# Patient Record
Sex: Female | Born: 1965
Health system: Southern US, Community
[De-identification: ages and names within clinical notes are randomized; demographics above are authoritative.]

## PROBLEM LIST (undated history)

## (undated) DIAGNOSIS — I10 Essential (primary) hypertension: Secondary | ICD-10-CM

## (undated) DIAGNOSIS — E785 Hyperlipidemia, unspecified: Secondary | ICD-10-CM

## (undated) DIAGNOSIS — R739 Hyperglycemia, unspecified: Secondary | ICD-10-CM

## (undated) DIAGNOSIS — E119 Type 2 diabetes mellitus without complications: Secondary | ICD-10-CM

## (undated) HISTORY — DX: Hyperlipidemia, unspecified: E78.5

## (undated) HISTORY — DX: Essential (primary) hypertension: I10

## (undated) HISTORY — DX: Hyperglycemia, unspecified: R73.9

---

## 2001-04-01 ENCOUNTER — Encounter: Payer: Self-pay | Admitting: Emergency Medicine

## 2001-04-01 ENCOUNTER — Emergency Department (HOSPITAL_COMMUNITY): Admission: EM | Admit: 2001-04-01 | Discharge: 2001-04-01 | Payer: Self-pay | Admitting: Emergency Medicine

## 2002-10-22 ENCOUNTER — Other Ambulatory Visit: Admission: RE | Admit: 2002-10-22 | Discharge: 2002-10-22 | Payer: Self-pay | Admitting: Family Medicine

## 2002-10-24 ENCOUNTER — Encounter: Admission: RE | Admit: 2002-10-24 | Discharge: 2002-10-24 | Payer: Self-pay | Admitting: Family Medicine

## 2002-10-24 ENCOUNTER — Encounter: Payer: Self-pay | Admitting: Family Medicine

## 2004-06-09 ENCOUNTER — Ambulatory Visit: Payer: Self-pay | Admitting: Family Medicine

## 2005-05-10 ENCOUNTER — Ambulatory Visit: Payer: Self-pay | Admitting: Family Medicine

## 2005-05-12 ENCOUNTER — Ambulatory Visit: Payer: Self-pay | Admitting: Internal Medicine

## 2005-05-15 ENCOUNTER — Encounter: Admission: RE | Admit: 2005-05-15 | Discharge: 2005-05-15 | Payer: Self-pay | Admitting: Family Medicine

## 2005-05-25 ENCOUNTER — Ambulatory Visit: Payer: Self-pay | Admitting: Family Medicine

## 2005-05-30 ENCOUNTER — Ambulatory Visit: Payer: Self-pay | Admitting: Family Medicine

## 2005-06-02 ENCOUNTER — Ambulatory Visit: Payer: Self-pay | Admitting: Family Medicine

## 2005-07-06 ENCOUNTER — Ambulatory Visit: Payer: Self-pay | Admitting: Family Medicine

## 2005-09-01 ENCOUNTER — Ambulatory Visit: Payer: Self-pay | Admitting: Family Medicine

## 2006-10-05 ENCOUNTER — Encounter: Admission: RE | Admit: 2006-10-05 | Discharge: 2006-10-05 | Payer: Self-pay | Admitting: Family Medicine

## 2007-01-22 ENCOUNTER — Telehealth: Payer: Self-pay | Admitting: Family Medicine

## 2007-01-24 ENCOUNTER — Ambulatory Visit: Payer: Self-pay | Admitting: Family Medicine

## 2007-01-24 DIAGNOSIS — Z9889 Other specified postprocedural states: Secondary | ICD-10-CM

## 2007-01-24 DIAGNOSIS — I10 Essential (primary) hypertension: Secondary | ICD-10-CM

## 2007-01-24 DIAGNOSIS — D573 Sickle-cell trait: Secondary | ICD-10-CM

## 2007-02-07 ENCOUNTER — Ambulatory Visit: Payer: Self-pay | Admitting: Family Medicine

## 2007-02-14 LAB — CONVERTED CEMR LAB
ALT: 18 units/L (ref 0–35)
AST: 22 units/L (ref 0–37)
Albumin: 3.7 g/dL (ref 3.5–5.2)
Alkaline Phosphatase: 84 units/L (ref 39–117)
BUN: 8 mg/dL (ref 6–23)
Basophils Absolute: 0 10*3/uL (ref 0.0–0.1)
Basophils Relative: 0.7 % (ref 0.0–1.0)
Bilirubin, Direct: 0.1 mg/dL (ref 0.0–0.3)
CO2: 29 meq/L (ref 19–32)
Calcium: 9.6 mg/dL (ref 8.4–10.5)
Chloride: 99 meq/L (ref 96–112)
Cholesterol: 170 mg/dL (ref 0–200)
Creatinine, Ser: 0.9 mg/dL (ref 0.4–1.2)
Eosinophils Absolute: 0.1 10*3/uL (ref 0.0–0.6)
Eosinophils Relative: 1.4 % (ref 0.0–5.0)
GFR calc Af Amer: 88 mL/min
GFR calc non Af Amer: 73 mL/min
Glucose, Bld: 110 mg/dL — ABNORMAL HIGH (ref 70–99)
HCT: 38.3 % (ref 36.0–46.0)
HDL: 37.4 mg/dL — ABNORMAL LOW (ref >39.0)
Hemoglobin: 12.5 g/dL (ref 12.0–15.0)
Hgb A1c MFr Bld: 6.6 % — ABNORMAL HIGH (ref 4.6–6.0)
LDL Cholesterol: 118 mg/dL — ABNORMAL HIGH (ref 0–99)
Lymphocytes Relative: 40.4 % (ref 12.0–46.0)
MCHC: 32.6 g/dL (ref 30.0–36.0)
MCV: 76.2 fL — ABNORMAL LOW (ref 78.0–100.0)
Monocytes Absolute: 0.4 10*3/uL (ref 0.2–0.7)
Monocytes Relative: 6.5 % (ref 3.0–11.0)
Neutro Abs: 3.1 10*3/uL (ref 1.4–7.7)
Neutrophils Relative %: 51 % (ref 43.0–77.0)
Platelets: 295 10*3/uL (ref 150–400)
Potassium: 3.5 meq/L (ref 3.5–5.1)
RBC: 5.03 M/uL (ref 3.87–5.11)
RDW: 13.3 % (ref 11.5–14.6)
Sodium: 139 meq/L (ref 135–145)
TSH: 1.45 microintl units/mL (ref 0.35–5.50)
Total Bilirubin: 0.7 mg/dL (ref 0.3–1.2)
Total CHOL/HDL Ratio: 4.5
Total Protein: 8 g/dL (ref 6.0–8.3)
Triglycerides: 75 mg/dL (ref 0–149)
VLDL: 15 mg/dL (ref 0–40)
WBC: 6 10*3/uL (ref 4.5–10.5)

## 2007-02-22 ENCOUNTER — Encounter: Payer: Self-pay | Admitting: Family Medicine

## 2007-02-22 ENCOUNTER — Other Ambulatory Visit: Admission: RE | Admit: 2007-02-22 | Discharge: 2007-02-22 | Payer: Self-pay | Admitting: Family Medicine

## 2007-02-22 ENCOUNTER — Ambulatory Visit: Payer: Self-pay | Admitting: Family Medicine

## 2007-02-28 ENCOUNTER — Encounter (INDEPENDENT_AMBULATORY_CARE_PROVIDER_SITE_OTHER): Payer: Self-pay | Admitting: *Deleted

## 2007-10-14 ENCOUNTER — Ambulatory Visit: Payer: Self-pay | Admitting: Family Medicine

## 2007-10-14 DIAGNOSIS — R7309 Other abnormal glucose: Secondary | ICD-10-CM | POA: Insufficient documentation

## 2007-10-14 DIAGNOSIS — E785 Hyperlipidemia, unspecified: Secondary | ICD-10-CM

## 2007-10-25 ENCOUNTER — Ambulatory Visit: Payer: Self-pay | Admitting: Family Medicine

## 2007-11-01 ENCOUNTER — Ambulatory Visit: Payer: Self-pay | Admitting: Family Medicine

## 2007-11-01 DIAGNOSIS — M25529 Pain in unspecified elbow: Secondary | ICD-10-CM | POA: Insufficient documentation

## 2007-11-01 LAB — CONVERTED CEMR LAB
ALT: 14 units/L (ref 0–35)
AST: 19 units/L (ref 0–37)
Albumin: 3.7 g/dL (ref 3.5–5.2)
Alkaline Phosphatase: 75 units/L (ref 39–117)
BUN: 9 mg/dL (ref 6–23)
Bilirubin, Direct: 0.1 mg/dL (ref 0.0–0.3)
CO2: 31 meq/L (ref 19–32)
Calcium: 9.6 mg/dL (ref 8.4–10.5)
Chloride: 101 meq/L (ref 96–112)
Cholesterol: 178 mg/dL (ref 0–200)
Creatinine, Ser: 1 mg/dL (ref 0.4–1.2)
GFR calc Af Amer: 77 mL/min
GFR calc non Af Amer: 64 mL/min
Glucose, Bld: 131 mg/dL — ABNORMAL HIGH (ref 70–99)
HDL: 35.3 mg/dL — ABNORMAL LOW (ref >39.0)
Hgb A1c MFr Bld: 6.1 % — ABNORMAL HIGH (ref 4.6–6.0)
LDL Cholesterol: 128 mg/dL — ABNORMAL HIGH (ref 0–99)
Potassium: 3.6 meq/L (ref 3.5–5.1)
Sodium: 138 meq/L (ref 135–145)
Total Bilirubin: 0.6 mg/dL (ref 0.3–1.2)
Total CHOL/HDL Ratio: 5
Total Protein: 7.9 g/dL (ref 6.0–8.3)
Triglycerides: 76 mg/dL (ref 0–149)
VLDL: 15 mg/dL (ref 0–40)

## 2007-11-04 ENCOUNTER — Ambulatory Visit: Payer: Self-pay | Admitting: Family Medicine

## 2007-11-05 ENCOUNTER — Telehealth (INDEPENDENT_AMBULATORY_CARE_PROVIDER_SITE_OTHER): Payer: Self-pay | Admitting: *Deleted

## 2007-11-05 ENCOUNTER — Encounter: Payer: Self-pay | Admitting: Family Medicine

## 2008-02-10 ENCOUNTER — Telehealth (INDEPENDENT_AMBULATORY_CARE_PROVIDER_SITE_OTHER): Payer: Self-pay | Admitting: *Deleted

## 2008-02-13 ENCOUNTER — Telehealth (INDEPENDENT_AMBULATORY_CARE_PROVIDER_SITE_OTHER): Payer: Self-pay | Admitting: *Deleted

## 2008-02-14 ENCOUNTER — Encounter: Payer: Self-pay | Admitting: Family Medicine

## 2008-02-18 ENCOUNTER — Telehealth: Payer: Self-pay | Admitting: Family Medicine

## 2008-02-28 ENCOUNTER — Ambulatory Visit: Payer: Self-pay | Admitting: Family Medicine

## 2008-02-28 DIAGNOSIS — Z8679 Personal history of other diseases of the circulatory system: Secondary | ICD-10-CM | POA: Insufficient documentation

## 2008-03-08 ENCOUNTER — Encounter (INDEPENDENT_AMBULATORY_CARE_PROVIDER_SITE_OTHER): Payer: Self-pay | Admitting: *Deleted

## 2008-03-08 LAB — CONVERTED CEMR LAB
ALT: 15 units/L (ref 0–35)
AST: 18 units/L (ref 0–37)
Albumin: 4.2 g/dL (ref 3.5–5.2)
Alkaline Phosphatase: 66 units/L (ref 39–117)
BUN: 9 mg/dL (ref 6–23)
Bilirubin, Direct: 0.1 mg/dL (ref 0.0–0.3)
CO2: 25 meq/L (ref 19–32)
Calcium: 9.7 mg/dL (ref 8.4–10.5)
Chloride: 101 meq/L (ref 96–112)
Cholesterol: 175 mg/dL (ref 0–200)
Creatinine, Ser: 0.9 mg/dL (ref 0.4–1.2)
GFR calc Af Amer: 87 mL/min
GFR calc non Af Amer: 72 mL/min
Glucose, Bld: 93 mg/dL (ref 70–99)
HDL: 31 mg/dL — ABNORMAL LOW (ref >39.0)
LDL Cholesterol: 128 mg/dL — ABNORMAL HIGH (ref 0–99)
Potassium: 3.3 meq/L — ABNORMAL LOW (ref 3.5–5.1)
Sodium: 138 meq/L (ref 135–145)
Total Bilirubin: 0.8 mg/dL (ref 0.3–1.2)
Total CHOL/HDL Ratio: 5.6
Total Protein: 8.6 g/dL — ABNORMAL HIGH (ref 6.0–8.3)
Triglycerides: 79 mg/dL (ref 0–149)
VLDL: 16 mg/dL (ref 0–40)

## 2008-04-07 ENCOUNTER — Telehealth (INDEPENDENT_AMBULATORY_CARE_PROVIDER_SITE_OTHER): Payer: Self-pay | Admitting: *Deleted

## 2009-01-02 IMAGING — CR DG ELBOW COMPLETE 3+V*R*
4 series · 4 of 4 positions shown · non-contrast
Comparison: None.

CLINICAL DATA: Pain, lateral side.  Injury [DATE]

RIGHT ELBOW - COMPLETE 3+ VIEW

[view not recorded (1 of 4)]
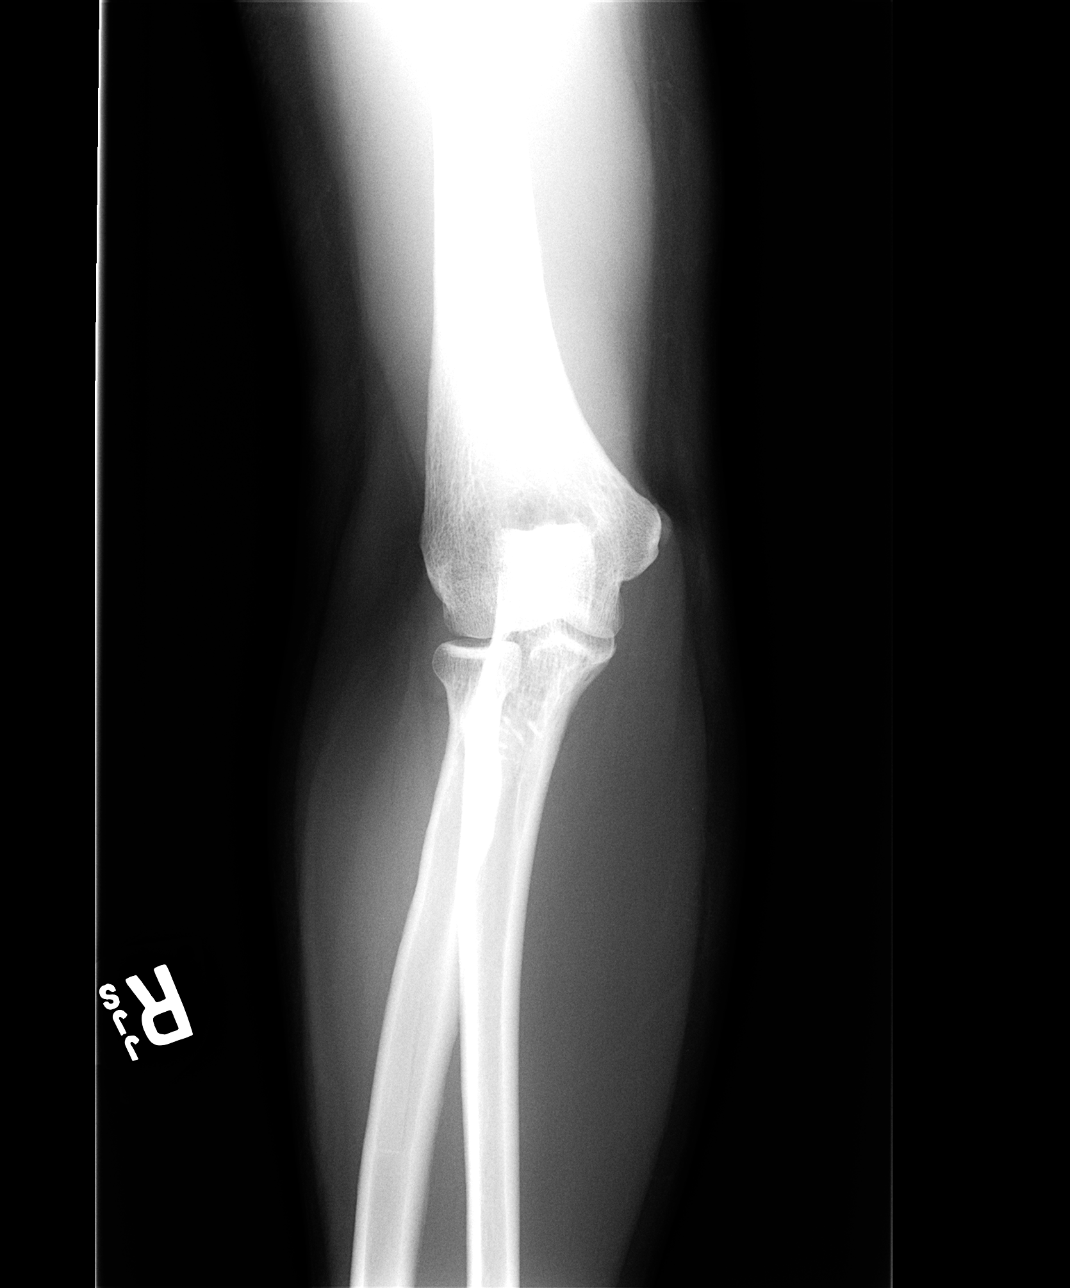

[view not recorded (2 of 4)]
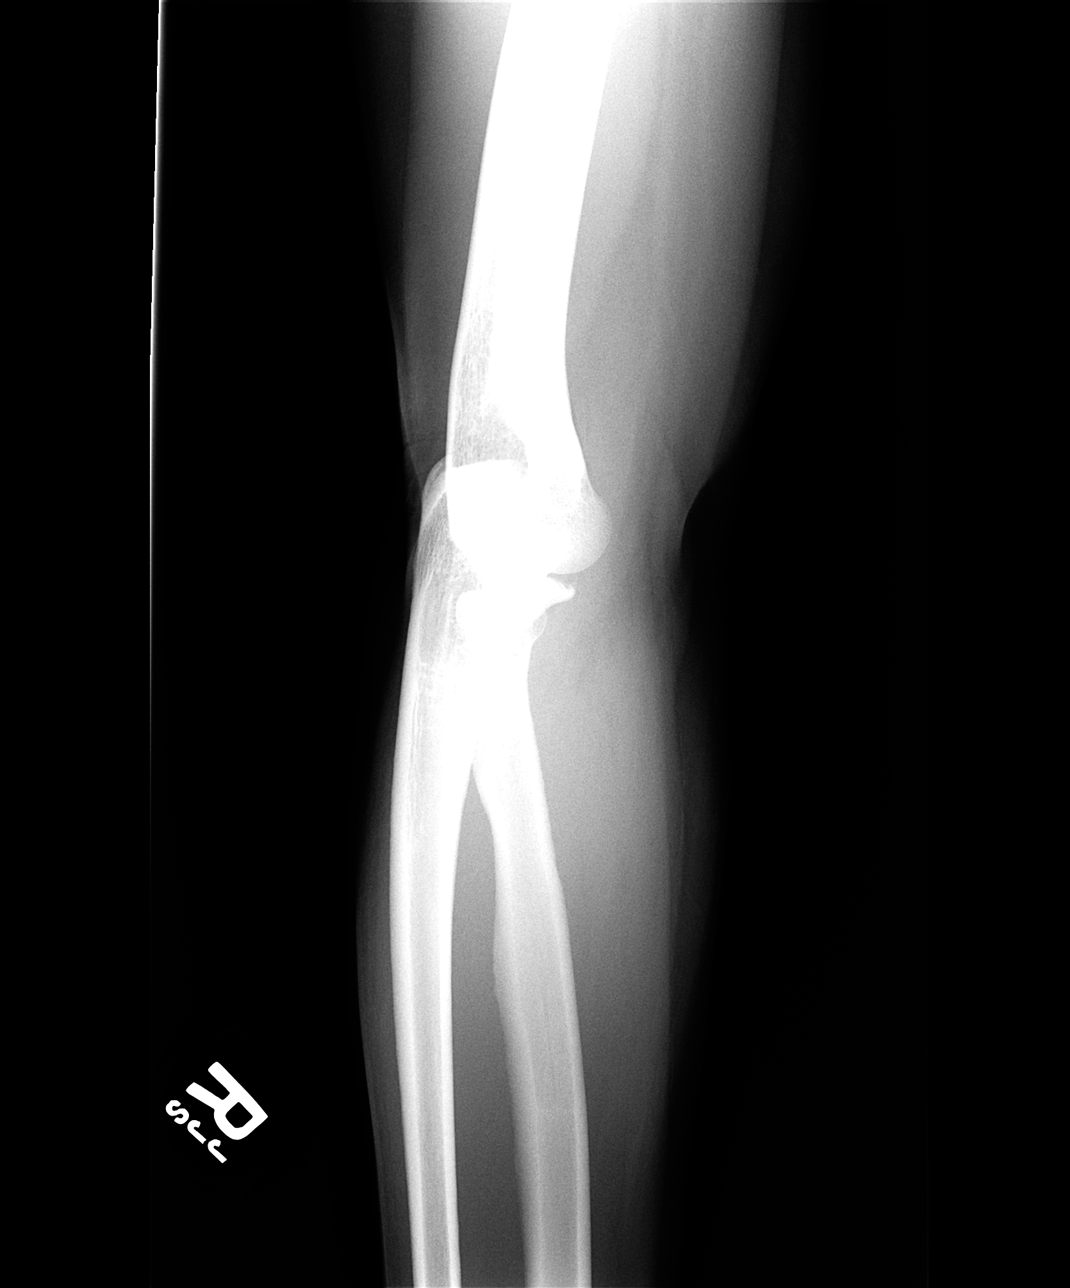

[view not recorded (3 of 4)]
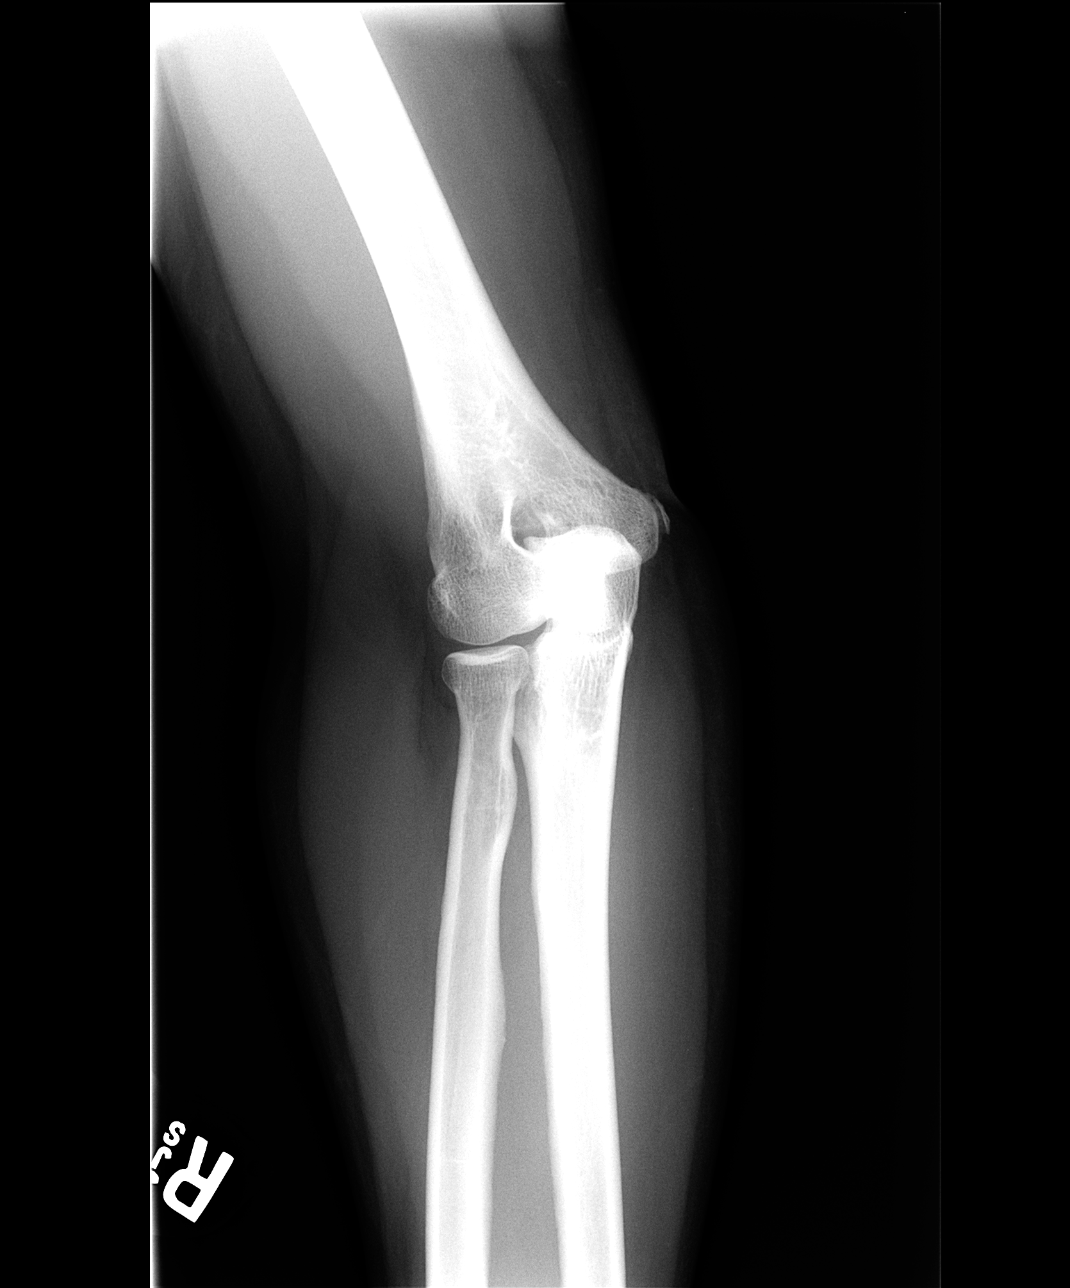

[view not recorded (4 of 4)]
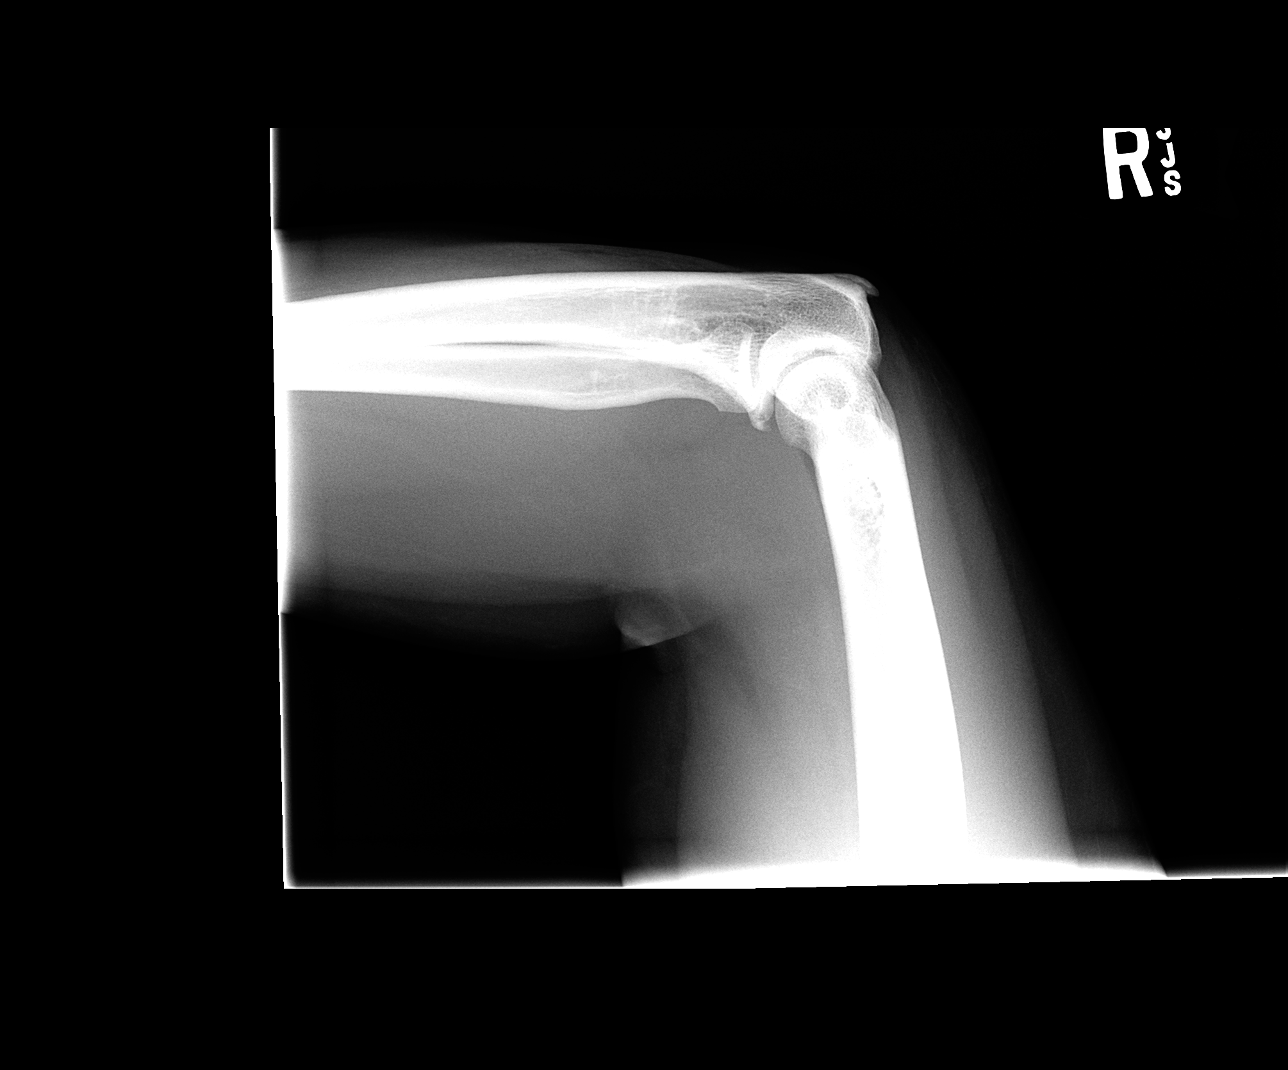

[4 of 4 positions shown; findings below may reference images not displayed]

FINDINGS: Incidental enthesophyte at the site of insertion of the
triceps tendon.  Focal calcification/ossification adjacent to the
medial epicondyle of the humerus.  This is likely related to prior
trauma to the proximal aspect of the medial collateral ligament.
No joint effusion.  Irregularity of the coronoid process of the
ulna may be a result of remote trauma.
IMPRESSION: Probable remote post-traumatic findings including
calcification/ossification of the proximal aspect of the medial
collateral ligament and  irregularity of the coronoid process of
the ulna.

## 2009-02-25 ENCOUNTER — Telehealth (INDEPENDENT_AMBULATORY_CARE_PROVIDER_SITE_OTHER): Payer: Self-pay | Admitting: *Deleted

## 2010-10-13 ENCOUNTER — Encounter: Payer: Self-pay | Admitting: Family Medicine

## 2010-10-13 ENCOUNTER — Other Ambulatory Visit: Payer: Self-pay | Admitting: Family Medicine

## 2010-10-13 ENCOUNTER — Ambulatory Visit (INDEPENDENT_AMBULATORY_CARE_PROVIDER_SITE_OTHER): Payer: Self-pay | Admitting: Family Medicine

## 2010-10-13 DIAGNOSIS — R7309 Other abnormal glucose: Secondary | ICD-10-CM

## 2010-10-13 DIAGNOSIS — I10 Essential (primary) hypertension: Secondary | ICD-10-CM

## 2010-10-13 DIAGNOSIS — R011 Cardiac murmur, unspecified: Secondary | ICD-10-CM | POA: Insufficient documentation

## 2010-10-13 DIAGNOSIS — F411 Generalized anxiety disorder: Secondary | ICD-10-CM | POA: Insufficient documentation

## 2010-10-13 DIAGNOSIS — E785 Hyperlipidemia, unspecified: Secondary | ICD-10-CM

## 2010-10-14 LAB — TSH: TSH: 1.23 u[IU]/mL (ref 0.35–5.50)

## 2010-10-14 LAB — HEPATIC FUNCTION PANEL
ALT: 17 U/L (ref 0–35)
AST: 21 U/L (ref 0–37)
Alkaline Phosphatase: 75 U/L (ref 39–117)
Total Bilirubin: 0.5 mg/dL (ref 0.3–1.2)

## 2010-10-14 LAB — CBC WITH DIFFERENTIAL/PLATELET
Basophils Absolute: 0 10*3/uL (ref 0.0–0.1)
Eosinophils Relative: 0.9 % (ref 0.0–5.0)
HCT: 39.5 % (ref 36.0–46.0)
Hemoglobin: 12.9 g/dL (ref 12.0–15.0)
Lymphocytes Relative: 66.7 % — ABNORMAL HIGH (ref 12.0–46.0)
Lymphs Abs: 1.5 10*3/uL (ref 0.7–4.0)
Monocytes Relative: 13.1 % — ABNORMAL HIGH (ref 3.0–12.0)
Neutro Abs: 0.4 10*3/uL — ABNORMAL LOW (ref 1.4–7.7)
Platelets: 210 10*3/uL (ref 150.0–400.0)
RDW: 16.4 % — ABNORMAL HIGH (ref 11.5–14.6)
WBC: 2.3 10*3/uL — ABNORMAL LOW (ref 4.5–10.5)

## 2010-10-14 LAB — LIPID PANEL
HDL: 44.2 mg/dL (ref >39.00)
LDL Cholesterol: 142 mg/dL — ABNORMAL HIGH (ref 0–99)
VLDL: 13.6 mg/dL (ref 0.0–40.0)

## 2010-10-14 LAB — BASIC METABOLIC PANEL
Calcium: 9.4 mg/dL (ref 8.4–10.5)
GFR: 90.93 mL/min (ref >60.00)
Glucose, Bld: 106 mg/dL — ABNORMAL HIGH (ref 70–99)
Potassium: 3.8 mEq/L (ref 3.5–5.1)
Sodium: 139 mEq/L (ref 135–145)

## 2010-10-17 ENCOUNTER — Emergency Department (HOSPITAL_COMMUNITY)
Admission: EM | Admit: 2010-10-17 | Discharge: 2010-10-18 | Disposition: A | Discharge status: Home / Self Care | Payer: Self-pay | Attending: Emergency Medicine | Admitting: Emergency Medicine

## 2010-10-17 DIAGNOSIS — F411 Generalized anxiety disorder: Secondary | ICD-10-CM | POA: Insufficient documentation

## 2010-10-17 DIAGNOSIS — I10 Essential (primary) hypertension: Secondary | ICD-10-CM | POA: Insufficient documentation

## 2010-10-17 DIAGNOSIS — Z79899 Other long term (current) drug therapy: Secondary | ICD-10-CM | POA: Insufficient documentation

## 2010-10-18 ENCOUNTER — Telehealth: Payer: Self-pay | Admitting: Family Medicine

## 2010-10-18 NOTE — Assessment & Plan Note (Signed)
Summary: F/U med, BP check//fd   Vital Signs:  Patient profile:   45 year old female Height:      66.5 inches Weight:      184.8 pounds BMI:     29.49 Pulse rate:   143 / minute Pulse rhythm:   irregular BP sitting:   170 / 110  (left arm) Cuff size:   large  Vitals Entered By: Almeta Monas CMA Duncan Dull) (October 13, 2010 3:24 PM)  Serial Vital Signs/Assessments:  Time      Position  BP       Pulse  Resp  Temp     By 3:39 PM             188/108  132                   Almeta Monas CMA (AAMA)  CC: c/o anxiety, headaches, some visual issue and would like BP check---checked twice 170/110 and 200/110   History of Present Illness: Pt here for first time in several years.  She has not been taking any medication.  Her 22yo daughter died in March 07, 2023 from Cancer and she has been struggling since.  Pt BP has been running high and she had been having headaches and blurry vision.  Pt states she has no symptoms now.  No CP, No SOB.  Pt is crying and very anxious.     Current Medications (verified): 1)  Bayer Low Strength 81 Mg Tbec (Aspirin) .... By Mouth Once Daily 2)  Gnp Prenatal Vitamins 28-0.8 Mg Tabs (Prenatal Vit-Fe Fumarate-Fa) .... By Mouth Once Daily 3)  Metoprolol Tartrate 50 Mg Tabs (Metoprolol Tartrate) .Marland Kitchen.. 1 By Mouth Two Times A Day 4)  Xanax 0.25 Mg Tabs (Alprazolam) .Marland Kitchen.. 1 By Mouth Three Times A Day As Needed 5)  Celexa 20 Mg Tabs (Citalopram Hydrobromide) .Marland Kitchen.. 1 By Mouth Once Daily  Allergies (verified): No Known Drug Allergies  Past History:  Past medical, surgical, family and social histories (including risk factors) reviewed for relevance to current acute and chronic problems.  Past Medical History: Reviewed history from 10/14/2007 and no changes required. Hypertension Hyperlipidemia hyperglycemia  Past Surgical History: Reviewed history from 01/24/2007 and no changes required. Caesarean section  Family History: Reviewed history from 01/24/2007 and no changes  required. Family History Diabetes 1st degree relative MGM-- enlarged heart paunt- sickle cell   Social History: Reviewed history from 01/24/2007 and no changes required. Occupation:cleans houses Married Never Smoked Alcohol use-yes Drug use-no Regular exercise-yes  Review of Systems      See HPI  Physical Exam  General:  alert, well-developed, well-nourished, cooperative to examination, and good hygiene.   Eyes:  pupils equal and pupils round.   Lungs:  Normal respiratory effort, chest expands symmetrically. Lungs are clear to auscultation, no crackles or wheezes. Heart:  tachycardia and 2/6 systolic ejection murmur.   Extremities:  No clubbing, cyanosis, edema, or deformity noted with normal full range of motion of all joints.   Psych:  Oriented X3, not agitated, not suicidal, not homicidal, tearful, and severely anxious.     Impression & Recommendations:  Problem # 1:  HYPERTENSION (ICD-401.9) Assessment Deteriorated  The following medications were removed from the medication list:    Avalide 300-12.5 Mg Tabs (Irbesartan-hydrochlorothiazide) .Marland Kitchen... 1 once daily-needs labs    Norvasc 5 Mg Tabs (Amlodipine besylate) .Marland Kitchen... 1 by mouth once daily- Her updated medication list for this problem includes:    Metoprolol Tartrate 50 Mg Tabs (Metoprolol  tartrate) .Marland Kitchen... 1 by mouth two times a day  BP today: 170/110 Prior BP: 110/80 (02/28/2008)  Labs Reviewed: K+: 3.3 (02/28/2008) Creat: : 0.9 (02/28/2008)   Chol: 175 (02/28/2008)   HDL: 31.0 (02/28/2008)   LDL: 128 (02/28/2008)   TG: 79 (02/28/2008)  Problem # 2:  HYPERGLYCEMIA (ICD-790.29)  Orders: Venipuncture (04540) TLB-Lipid Panel (80061-LIPID) TLB-BMP (Basic Metabolic Panel-BMET) (80048-METABOL) TLB-CBC Platelet - w/Differential (85025-CBCD) TLB-Hepatic/Liver Function Pnl (80076-HEPATIC) TLB-TSH (Thyroid Stimulating Hormone) (84443-TSH) TLB-A1C / Hgb A1C (Glycohemoglobin) (83036-A1C) Specimen Handling (98119)  Labs  Reviewed: Creat: 0.9 (02/28/2008)     Problem # 3:  HYPERLIPIDEMIA (ICD-272.4)  Orders: Venipuncture (14782) TLB-Lipid Panel (80061-LIPID) TLB-BMP (Basic Metabolic Panel-BMET) (80048-METABOL) TLB-CBC Platelet - w/Differential (85025-CBCD) TLB-Hepatic/Liver Function Pnl (80076-HEPATIC) TLB-TSH (Thyroid Stimulating Hormone) (84443-TSH) TLB-A1C / Hgb A1C (Glycohemoglobin) (83036-A1C) Specimen Handling (95621)  Labs Reviewed: SGOT: 18 (02/28/2008)   SGPT: 15 (02/28/2008)   HDL:31.0 (02/28/2008), 35.3 (10/25/2007)  LDL:128 (02/28/2008), 128 (10/25/2007)  Chol:175 (02/28/2008), 178 (10/25/2007)  Trig:79 (02/28/2008), 76 (10/25/2007)  Problem # 4:  GENERALIZED ANXIETY DISORDER (ICD-300.02) Assessment: New  Her updated medication list for this problem includes:    Xanax 0.25 Mg Tabs (Alprazolam) .Marland Kitchen... 1 by mouth three times a day as needed    Celexa 20 Mg Tabs (Citalopram hydrobromide) .Marland Kitchen... 1 by mouth once daily  Discussed medication use and relaxation techniques.   Complete Medication List: 1)  Bayer Low Strength 81 Mg Tbec (Aspirin) .... By mouth once daily 2)  Gnp Prenatal Vitamins 28-0.8 Mg Tabs (Prenatal vit-fe fumarate-fa) .... By mouth once daily 3)  Metoprolol Tartrate 50 Mg Tabs (Metoprolol tartrate) .Marland Kitchen.. 1 by mouth two times a day 4)  Xanax 0.25 Mg Tabs (Alprazolam) .Marland Kitchen.. 1 by mouth three times a day as needed 5)  Celexa 20 Mg Tabs (Citalopram hydrobromide) .Marland Kitchen.. 1 by mouth once daily  Patient Instructions: 1)  Please schedule a follow-up appointment in 2 weeks.  2)  If you develop any CP, SOB, blurry vision--- go to ER Prescriptions: CELEXA 20 MG TABS (CITALOPRAM HYDROBROMIDE) 1 by mouth once daily  #302 x 0   Entered and Authorized by:   Loreen Freud DO   Signed by:   Loreen Freud DO on 10/13/2010   Method used:   [facility] to        [facility]  [address] [id number]       [address]       [place], [place]  [address]       Ph: [phone]  or [phone]       Fax: [phone]   RxID:   [id number] XANAX 0.25 MG TABS (ALPRAZOLAM) 1 by mouth three times a day as needed  #60 x 0   Entered and Authorized by:   Loreen Freud DO   Signed by:   Loreen Freud DO on 10/13/2010   Method used:   Print then Give to Patient   RxID:   [id number] METOPROLOL TARTRATE 50 MG TABS (METOPROLOL TARTRATE) 1 by mouth two times a day  #60 x 2   Entered and Authorized by:   Loreen Freud DO   Signed by:   Loreen Freud DO on 10/13/2010   Method used:   [facility]        [facility]  [address] [id number]       [address]       [place], [place]  [address]  Ph: [phone] or [phone]       Fax: [phone]   RxID:   [id number]    Orders Added: 1)  Venipuncture [44010] 2)  TLB-Lipid Panel [80061-LIPID] 3)  TLB-BMP (Basic Metabolic Panel-BMET) [80048-METABOL] 4)  TLB-CBC Platelet - w/Differential [85025-CBCD] 5)  TLB-Hepatic/Liver Function Pnl [80076-HEPATIC] 6)  TLB-TSH (Thyroid Stimulating Hormone) [84443-TSH] 7)  TLB-A1C / Hgb A1C (Glycohemoglobin) [83036-A1C] 8)  Specimen Handling [99000] 9)  Est. Patient Level III [27253]     EKG  Procedure date:  10/13/2010  Findings:      Sinus tachycardia with rate of:     Appended Document: F/U med, BP check//fd  Laboratory Results   Urine Tests   Date/Time Reported: October 13, 2010 4:11 PM   Routine Urinalysis   Color: straw Appearance: Clear Glucose: negative   (Normal Range: Negative) Bilirubin: negative   (Normal Range: Negative) Ketone: large (80)   (Normal Range: Negative) Spec. Gravity: 1.020   (Normal Range: 1.003-1.035) Blood: large   (Normal Range: Negative) pH: 5.0   (Normal Range: 5.0-8.0) Protein: >=300   (Normal Range: Negative) Urobilinogen: negative   (Normal Range: 0-1) Nitrite: negative   (Normal Range: Negative) Leukocyte Esterace: negative   (Normal Range: Negative)    Comments: Floydene Flock  October 13, 2010 4:12 PM Menses/ cx not sent     Appended Document: Orders Update    Clinical Lists Changes  Problems: Added new problem of CARDIAC MURMUR (ICD-785.2) Orders: Added new Referral order of Echo Referral (Echo) - Signed

## 2010-10-24 ENCOUNTER — Other Ambulatory Visit (HOSPITAL_COMMUNITY): Payer: Self-pay

## 2010-10-25 NOTE — Progress Notes (Signed)
Summary: Elevated BP, increase med?  Phone Note Refill Request Call back at Home Phone [phone]   Refills Requested: Medication #1:  METOPROLOL TARTRATE 50 MG TABS 1 by mouth two times a day Pt state that she was seen in ED yesterday and held for observation due to BP elevated @ 132/110. Pt states that she was given some clonidine to help decrease BP while she was in ED. Pt notes that she was release and is doing well this am.  Pt increase med to 2 tab bid and BP is now 138/87 30 minutes later. Pt has only taken 2 tab once this AM. Pt would like to know if she can continue to take 2 tab bid since it seem to help BP versus the 1 tab that was Rx. Pls advise.......Marland KitchenFelecia Deloach CMA  October 18, 2010 11:23 AM   Pt [facility] [address]...Marland KitchenFelecia Deloach CMA  October 18, 2010 11:24 AM   Caller: Patient  Follow-up for Phone Call        yes but she needs ov to evaluate as well Follow-up by: Loreen Freud DO,  October 18, 2010 11:54 AM  Additional Follow-up for Phone Call Additional follow up Details #1::        pt has an appt with [place] on the 23rd, I advised if any problems prior to appt to call and she agreed.Almeta Monas CMA Duncan Dull)  October 18, 2010 1:56 PM

## 2010-10-28 ENCOUNTER — Ambulatory Visit (INDEPENDENT_AMBULATORY_CARE_PROVIDER_SITE_OTHER): Payer: Self-pay | Admitting: Family Medicine

## 2010-10-28 ENCOUNTER — Encounter: Payer: Self-pay | Admitting: Family Medicine

## 2010-10-28 VITALS — BP 126/86 | HR 80 | Wt 184.4 lb

## 2010-10-28 DIAGNOSIS — D7289 Other specified disorders of white blood cells: Secondary | ICD-10-CM | POA: Insufficient documentation

## 2010-10-28 DIAGNOSIS — I10 Essential (primary) hypertension: Secondary | ICD-10-CM

## 2010-10-28 LAB — CBC WITH DIFFERENTIAL/PLATELET
Basophils Relative: 0 % (ref 0–1)
Eosinophils Absolute: 0.1 10*3/uL (ref 0.0–0.7)
MCH: 24.3 pg — ABNORMAL LOW (ref 26.0–34.0)
MCHC: 34.2 g/dL (ref 30.0–36.0)
Neutrophils Relative %: 55 % (ref 43–77)
Platelets: 287 10*3/uL (ref 150–400)
RBC: 4.7 MIL/uL (ref 3.87–5.11)

## 2010-10-28 MED ORDER — METOPROLOL TARTRATE 50 MG PO TABS: 100.0000 mg | ORAL_TABLET | Freq: Every day | ORAL | Status: DC

## 2010-10-28 NOTE — Progress Notes (Signed)
  Subjective:    Patient ID: Danielle Flores, female    DOB: [birth year], 45 y.o.   MRN: [id number]  Hypertension This is a chronic problem. The current episode started more than 1 year ago. The problem has been gradually improving since onset. The problem is controlled. Pertinent negatives include no anxiety, blurred vision, chest pain, headaches, malaise/fatigue, neck pain, orthopnea, palpitations, peripheral edema, PND, shortness of breath or sweats.      Review of Systems  Constitutional: Negative for malaise/fatigue.  HENT: Negative for neck pain.   Eyes: Negative for blurred vision.  Respiratory: Negative for shortness of breath.   Cardiovascular: Negative for chest pain, palpitations, orthopnea and PND.  Neurological: Negative for headaches.       Objective:   Physical Exam  Constitutional: She is oriented to person, place, and time. She appears well-developed and well-nourished.  Neck: Normal range of motion. Neck supple. No thyromegaly present.  Cardiovascular: Normal rate, regular rhythm and normal heart sounds.   No murmur heard. Pulmonary/Chest: Effort normal and breath sounds normal. No respiratory distress. She has no rales.  Musculoskeletal: She exhibits no edema.  Lymphadenopathy:    She has no cervical adenopathy.  Neurological: She is alert and oriented to person, place, and time.          Assessment & Plan:

## 2010-10-28 NOTE — Patient Instructions (Signed)

## 2010-10-31 NOTE — Progress Notes (Signed)
Copy mailed.     KP 

## 2010-11-11 ENCOUNTER — Encounter: Payer: Self-pay | Admitting: Family Medicine

## 2010-11-18 ENCOUNTER — Ambulatory Visit: Payer: Self-pay | Admitting: Family Medicine

## 2010-12-06 ENCOUNTER — Other Ambulatory Visit: Payer: Self-pay | Admitting: Family Medicine

## 2011-02-14 ENCOUNTER — Other Ambulatory Visit: Payer: Self-pay

## 2011-02-14 MED ORDER — METOPROLOL TARTRATE 50 MG PO TABS: ORAL_TABLET | ORAL | Status: DC

## 2011-05-01 ENCOUNTER — Other Ambulatory Visit: Payer: Self-pay | Admitting: Family Medicine

## 2011-05-02 MED ORDER — METOPROLOL TARTRATE 50 MG PO TABS: ORAL_TABLET | ORAL | Status: DC

## 2011-05-02 NOTE — Telephone Encounter (Signed)
Rx faxed for a 30 day supply--Office visit due--Letter mailed    KP

## 2011-05-04 ENCOUNTER — Other Ambulatory Visit: Payer: Self-pay

## 2011-05-04 MED ORDER — METOPROLOL TARTRATE 50 MG PO TABS: ORAL_TABLET | ORAL | Status: DC

## 2011-06-13 ENCOUNTER — Other Ambulatory Visit: Payer: Self-pay | Admitting: Family Medicine

## 2011-06-13 MED ORDER — METOPROLOL TARTRATE 50 MG PO TABS: ORAL_TABLET | ORAL | Status: DC

## 2011-06-13 NOTE — Telephone Encounter (Signed)
Faxed.   KP 

## 2011-06-14 ENCOUNTER — Other Ambulatory Visit: Payer: Self-pay | Admitting: Family Medicine

## 2011-07-20 ENCOUNTER — Other Ambulatory Visit: Payer: Self-pay | Admitting: Family Medicine

## 2011-07-20 MED ORDER — ALPRAZOLAM 0.25 MG PO TABS: 0.2500 mg | ORAL_TABLET | Freq: Three times a day (TID) | ORAL | Status: DC | PRN

## 2011-07-20 NOTE — Telephone Encounter (Signed)
Last seen and filled 10/28/10.Marland KitchenMarland Kitchenplease advise   KP

## 2011-07-20 NOTE — Telephone Encounter (Signed)
Addended by: Arnette Norris on: 07/20/2011 05:42 PM   Modules accepted: Orders

## 2011-07-20 NOTE — Telephone Encounter (Signed)
Patient needs refill for alprazolam - [facility] [address] - she is going out of town this weekend

## 2011-08-22 ENCOUNTER — Telehealth: Payer: Self-pay | Admitting: Family Medicine

## 2011-08-22 NOTE — Telephone Encounter (Signed)
Refill x1  Each-----   Pt needs ov for f/u and labs

## 2011-08-22 NOTE — Telephone Encounter (Signed)
Last OV 10-28-10 last refill 07-20-11 #30 with no refills for alprazolam noted no A1c for pt/DM OV in chart since 10-13-10 was not sure your protocol for labs for DM

## 2011-08-22 NOTE — Telephone Encounter (Signed)
Please send refills of metformin and [facility]olam to [facility] [address]

## 2011-08-23 ENCOUNTER — Other Ambulatory Visit: Payer: Self-pay | Admitting: Family Medicine

## 2011-08-23 MED ORDER — METOPROLOL TARTRATE 50 MG PO TABS: ORAL_TABLET | ORAL | Status: DC

## 2011-08-23 MED ORDER — ALPRAZOLAM 0.25 MG PO TABS: 0.2500 mg | ORAL_TABLET | Freq: Three times a day (TID) | ORAL | Status: DC | PRN

## 2011-08-23 NOTE — Telephone Encounter (Signed)
Discuss with patient, Rx sent, appt scheduled. 

## 2011-08-23 NOTE — Telephone Encounter (Signed)
Faxed.   KP 

## 2011-09-08 ENCOUNTER — Ambulatory Visit (INDEPENDENT_AMBULATORY_CARE_PROVIDER_SITE_OTHER): Payer: BC Managed Care – PPO | Admitting: Family Medicine

## 2011-09-08 ENCOUNTER — Encounter: Payer: Self-pay | Admitting: Family Medicine

## 2011-09-08 VITALS — BP 160/100 | HR 79 | Temp 98.7°F | Wt 198.2 lb

## 2011-09-08 DIAGNOSIS — I10 Essential (primary) hypertension: Secondary | ICD-10-CM

## 2011-09-08 DIAGNOSIS — F411 Generalized anxiety disorder: Secondary | ICD-10-CM

## 2011-09-08 DIAGNOSIS — E041 Nontoxic single thyroid nodule: Secondary | ICD-10-CM

## 2011-09-08 DIAGNOSIS — R739 Hyperglycemia, unspecified: Secondary | ICD-10-CM

## 2011-09-08 DIAGNOSIS — Z1239 Encounter for other screening for malignant neoplasm of breast: Secondary | ICD-10-CM

## 2011-09-08 DIAGNOSIS — D649 Anemia, unspecified: Secondary | ICD-10-CM

## 2011-09-08 DIAGNOSIS — R7309 Other abnormal glucose: Secondary | ICD-10-CM

## 2011-09-08 DIAGNOSIS — F419 Anxiety disorder, unspecified: Secondary | ICD-10-CM

## 2011-09-08 DIAGNOSIS — B354 Tinea corporis: Secondary | ICD-10-CM

## 2011-09-08 LAB — CBC WITH DIFFERENTIAL/PLATELET
Basophils Relative: 1.3 % (ref 0.0–3.0)
Eosinophils Absolute: 0.1 10*3/uL (ref 0.0–0.7)
Eosinophils Relative: 1.1 % (ref 0.0–5.0)
Lymphocytes Relative: 32.9 % (ref 12.0–46.0)
Neutrophils Relative %: 59 % (ref 43.0–77.0)
Platelets: 273 10*3/uL (ref 150.0–400.0)
RBC: 4.96 Mil/uL (ref 3.87–5.11)
WBC: 6.5 10*3/uL (ref 4.5–10.5)

## 2011-09-08 LAB — POCT URINALYSIS DIPSTICK
Blood, UA: NEGATIVE
Glucose, UA: NEGATIVE
Nitrite, UA: NEGATIVE
Protein, UA: NEGATIVE
Urobilinogen, UA: 0.2

## 2011-09-08 LAB — IBC PANEL: Saturation Ratios: 39.4 % (ref 20.0–50.0)

## 2011-09-08 LAB — HEPATIC FUNCTION PANEL
ALT: 33 U/L (ref 0–35)
AST: 32 U/L (ref 0–37)
Bilirubin, Direct: 0 mg/dL (ref 0.0–0.3)
Total Bilirubin: 0.4 mg/dL (ref 0.3–1.2)

## 2011-09-08 LAB — BASIC METABOLIC PANEL
Calcium: 9.7 mg/dL (ref 8.4–10.5)
Creatinine, Ser: 1 mg/dL (ref 0.4–1.2)

## 2011-09-08 LAB — LIPID PANEL
LDL Cholesterol: 124 mg/dL — ABNORMAL HIGH (ref 0–99)
Total CHOL/HDL Ratio: 5
Triglycerides: 124 mg/dL (ref 0.0–149.0)
VLDL: 24.8 mg/dL (ref 0.0–40.0)

## 2011-09-08 LAB — HEMOGLOBIN A1C: Hgb A1c MFr Bld: 6.4 % (ref 4.6–6.5)

## 2011-09-08 LAB — MICROALBUMIN / CREATININE URINE RATIO: Creatinine,U: 89 mg/dL

## 2011-09-08 MED ORDER — CITALOPRAM HYDROBROMIDE 20 MG PO TABS: 20.0000 mg | ORAL_TABLET | Freq: Every day | ORAL | Status: DC

## 2011-09-08 MED ORDER — METOPROLOL TARTRATE 100 MG PO TABS: 100.0000 mg | ORAL_TABLET | Freq: Two times a day (BID) | ORAL | Status: DC

## 2011-09-08 MED ORDER — NAFTIFINE HCL 1 % EX CREA: TOPICAL_CREAM | Freq: Every day | CUTANEOUS | Status: AC

## 2011-09-08 NOTE — Patient Instructions (Signed)
Calorie Counting Diet A calorie counting diet requires you to eat the number of calories that are right for you in a day. Calories are the measurement of how much energy you get from the food you eat. Eating the right amount of calories is important for staying at a healthy weight. If you eat too many calories, your body will store them as fat and you may gain weight. If you eat too few calories, you may lose weight. Counting the number of calories you eat during a day will help you know if you are eating the right amount. A Registered Dietitian can determine how many calories you need in a day. The amount of calories needed varies from person to person. If your goal is to lose weight, you will need to eat fewer calories. Losing weight can benefit you if you are overweight or have health problems such as heart disease, high blood pressure, or diabetes. If your goal is to gain weight, you will need to eat more calories. Gaining weight may be necessary if you have a certain health problem that causes your body to need more energy. TIPS Whether you are increasing or decreasing the number of calories you eat during a day, it may be hard to get used to changes in what you eat and drink. The following are tips to help you keep track of the number of calories you eat.  Measure foods at home with measuring cups. This helps you know the amount of food and number of calories you are eating.   Restaurants often serve food in amounts that are larger than 1 serving. While eating out, estimate how many servings of a food you are given. For example, a serving of cooked rice is  cup or about the size of half of a fist. Knowing serving sizes will help you be aware of how much food you are eating at restaurants.   Ask for smaller portion sizes or child-size portions at restaurants.   Plan to eat half of a meal at a restaurant. Take the rest home or share the other half with a friend.   Read the Nutrition Facts panel on  food labels for calorie content and serving size. You can find out how many servings are in a package, the size of a serving, and the number of calories each serving has.   For example, a package might contain 3 cookies. The Nutrition Facts panel on that package says that 1 serving is 1 cookie. Below that, it will say there are 3 servings in the container. The calories section of the Nutrition Facts label says there are 90 calories. This means there are 90 calories in 1 cookie (1 serving). If you eat 1 cookie you have eaten 90 calories. If you eat all 3 cookies, you have eaten 270 calories (3 servings x 90 calories = 270 calories).  The list below tells you how big or small some common portion sizes are.  1 oz.........4 stacked dice.   3 oz.........Deck of cards.   1 tsp........Tip of little finger.   1 tbs........Thumb.   2 tbs........Golf ball.    cup.......Half of a fist.   1 cup........A fist.  KEEP A FOOD LOG Write down every food item you eat, the amount you eat, and the number of calories in each food you eat during the day. At the end of the day, you can add up the total number of calories you have eaten. It may help to keep a   list like the one below. Find out the calorie information by reading the Nutrition Facts panel on food labels. Breakfast  Bran cereal (1 cup, 110 calories).   Fat-free milk ( cup, 45 calories).  Snack  Apple (1 medium, 80 calories).  Lunch  Spinach (1 cup, 20 calories).   Tomato ( medium, 20 calories).   Chicken breast strips (3 oz, 165 calories).   Shredded cheddar cheese ( cup, 110 calories).   Light Italian dressing (2 tbs, 60 calories).   Whole-wheat bread (1 slice, 80 calories).   Tub margarine (1 tsp, 35 calories).   Vegetable soup (1 cup, 160 calories).  Dinner  Pork chop (3 oz, 190 calories).   Brown rice (1 cup, 215 calories).   Steamed broccoli ( cup, 20 calories).   Strawberries (1  cup, 65 calories).   Whipped  cream (1 tbs, 50 calories).  Daily Calorie Total: 1425 Document Released: 07/24/2005 Document Revised: 04/05/2011 Document Reviewed: 01/18/2007 [employer] Patient Information 2012 [employer]. 

## 2011-09-08 NOTE — Progress Notes (Signed)
  Subjective:    Patient here for follow-up of elevated blood pressure.  She is exercising and is adherent to a low-salt diet.  Blood pressure is well controlled at home. Cardiac symptoms: none. Patient denies: chest pain, chest pressure/discomfort, claudication, dyspnea, exertional chest pressure/discomfort, fatigue, irregular heart beat, lower extremity edema, near-syncope, orthopnea, palpitations, paroxysmal nocturnal dyspnea, syncope and tachypnea. Cardiovascular risk factors: dyslipidemia, hypertension and obesity (BMI >= 30 kg/m2). Use of agents associated with hypertension: none. History of target organ damage: none.  Pt also here to have labs to recheck cholesterol and sugar. Pt is not taking celexa she says because she forgot about it.   She has been under a lot of stress and would like to start back on it.  She is not suicidal.  The following portions of the patient's history were reviewed and updated as appropriate: allergies, current medications, past family history, past medical history, past social history, past surgical history and problem list.  Review of Systems Pertinent items are noted in HPI.     Objective:    BP 160/100  Pulse 79  Temp(Src) 98.7 F (37.1 C) (Oral)  Wt 198 lb 3.2 oz (89.903 kg)  SpO2 99% General appearance: alert, cooperative, appears stated age and no distress Neck: no adenopathy, supple, symmetrical, trachea midline and thyroid: enlarged Lungs: clear to auscultation bilaterally Heart: regular rate and rhythm, S1, S2 normal, no murmur, click, rub or gallop Extremities: extremities normal, atraumatic, no cyanosis or edema Skin: hyperpigmentation - under breasts, and its itchy   psych--no anxiety or depression Assessment:    Hypertension, stage 1 . Evidence of target organ damage: none.   tinea corporis---  naftin cream  hyperglycemia--check labs, con't diet and exercise Anxiety--- restart celexa and con't xanax prn  hyperlipidemia-- check labs   Plan:    Medication: increase to metoprolol 100 mg bid. Dietary sodium restriction. Regular aerobic exercise. Check blood pressures 2-3 times weekly and record. Follow up: 3 weeks and as needed.

## 2011-09-12 ENCOUNTER — Other Ambulatory Visit: Payer: Self-pay | Admitting: Family Medicine

## 2011-09-12 ENCOUNTER — Telehealth: Payer: Self-pay | Admitting: Family Medicine

## 2011-09-12 DIAGNOSIS — B354 Tinea corporis: Secondary | ICD-10-CM

## 2011-09-12 MED ORDER — NYSTATIN 100000 UNIT/GM EX POWD: Freq: Four times a day (QID) | CUTANEOUS | Status: AC

## 2011-09-12 NOTE — Telephone Encounter (Signed)
Different med sent to pharmacy

## 2011-09-12 NOTE — Telephone Encounter (Signed)
Please advise      KP 

## 2011-09-12 NOTE — Telephone Encounter (Signed)
Patient states that she could not fill naftifine because it would cost her $300. She would like a sample of this med. Or, if there is a generic form of this med?

## 2011-09-12 NOTE — Telephone Encounter (Signed)
Patient aware Rx sent to the pharmacy.      KP 

## 2011-09-15 ENCOUNTER — Ambulatory Visit
Admission: RE | Admit: 2011-09-15 | Discharge: 2011-09-15 | Disposition: A | Discharge status: Home / Self Care | Payer: BC Managed Care – PPO | Source: Ambulatory Visit | Attending: Family Medicine | Admitting: Family Medicine

## 2011-09-15 DIAGNOSIS — E041 Nontoxic single thyroid nodule: Secondary | ICD-10-CM

## 2011-09-18 ENCOUNTER — Telehealth: Payer: Self-pay | Admitting: Family Medicine

## 2011-09-18 NOTE — Telephone Encounter (Signed)
Discussed with patient--see results note      KP

## 2011-09-18 NOTE — Telephone Encounter (Signed)
[facility] Triage Call Report Triage Record Num: [id number] Operator: Tarri Glenn Patient Name: Danielle Flores Call Date & Time: 09/15/2011 5:42:27PM Patient Phone: [phone] PCP: Lelon Perla Patient Gender: Female PCP Fax : [phone] Patient DOB: 1966-05-03 Practice Name: [facility] Reason for Call: Caller: [name]/Patient; PCP: Lelon Perla.; CB#: [phone]; Call regarding Lab results; Patient is calling about message was left on answering machine and she was not able to reach call regarding ultrasound done on thyroid. Advised patient there was no change from previous scan per EPIC. Protocol(s) Used: Office Note Recommended Outcome per Protocol: Information Noted and Sent to Office Reason for Outcome: Caller information to office Care Advice: ~

## 2011-09-22 ENCOUNTER — Ambulatory Visit
Admission: RE | Admit: 2011-09-22 | Discharge: 2011-09-22 | Disposition: A | Discharge status: Home / Self Care | Payer: BC Managed Care – PPO | Source: Ambulatory Visit | Attending: Family Medicine | Admitting: Family Medicine

## 2011-09-22 ENCOUNTER — Ambulatory Visit: Payer: BC Managed Care – PPO | Admitting: Family Medicine

## 2011-09-22 DIAGNOSIS — Z1239 Encounter for other screening for malignant neoplasm of breast: Secondary | ICD-10-CM

## 2011-09-26 ENCOUNTER — Ambulatory Visit (INDEPENDENT_AMBULATORY_CARE_PROVIDER_SITE_OTHER): Payer: BC Managed Care – PPO | Admitting: Family Medicine

## 2011-09-26 ENCOUNTER — Encounter: Payer: Self-pay | Admitting: Family Medicine

## 2011-09-26 ENCOUNTER — Other Ambulatory Visit: Payer: Self-pay | Admitting: Family Medicine

## 2011-09-26 VITALS — BP 150/96 | HR 75 | Temp 99.0°F | Wt 197.8 lb

## 2011-09-26 DIAGNOSIS — I10 Essential (primary) hypertension: Secondary | ICD-10-CM

## 2011-09-26 DIAGNOSIS — R928 Other abnormal and inconclusive findings on diagnostic imaging of breast: Secondary | ICD-10-CM

## 2011-09-26 MED ORDER — AMLODIPINE BESYLATE 5 MG PO TABS: 5.0000 mg | ORAL_TABLET | Freq: Every day | ORAL | Status: DC

## 2011-09-26 NOTE — Progress Notes (Signed)
  Subjective:    Patient here for follow-up of elevated blood pressure.  She is exercising and is adherent to a low-salt diet.  Blood pressure is not well controlled at home. Cardiac symptoms: none. Patient denies: chest pain, chest pressure/discomfort, claudication, dyspnea, exertional chest pressure/discomfort, fatigue, irregular heart beat, lower extremity edema, near-syncope, orthopnea, palpitations, paroxysmal nocturnal dyspnea, syncope and tachypnea. Cardiovascular risk factors: dyslipidemia, hypertension and obesity (BMI >= 30 kg/m2). Use of agents associated with hypertension: none. History of target organ damage: none.  The following portions of the patient's history were reviewed and updated as appropriate: allergies, current medications, past family history, past medical history, past social history, past surgical history and problem list.  Review of Systems Pertinent items are noted in HPI.     Objective:    BP 150/96  Pulse 75  Temp(Src) 99 F (37.2 C) (Oral)  Wt 197 lb 12.8 oz (89.721 kg)  SpO2 97%  LMP 09/01/2011 General appearance: alert, cooperative, appears stated age and no distress Lungs: clear to auscultation bilaterally Heart: S1, S2 normal Extremities: extremities normal, atraumatic, no cyanosis or edema    Assessment:    Hypertension, stage 1 . Evidence of target organ damage: none.   hyperglycemia--- discussed diet and exercise with pt Plan:    Medication: continue metoprolol and begin norvasc. Dietary sodium restriction. Regular aerobic exercise. Check blood pressures 2-3 times weekly and record. Follow up: 2 weeks and as needed.

## 2011-09-26 NOTE — Patient Instructions (Addendum)
Hypertension As your heart beats, it forces blood through your arteries. This force is your blood pressure. If the pressure is too high, it is called hypertension (HTN) or high blood pressure. HTN is dangerous because you may have it and not know it. High blood pressure may mean that your heart has to work harder to pump blood. Your arteries may be narrow or stiff. The extra work puts you at risk for heart disease, stroke, and other problems.  Blood pressure consists of two numbers, a higher number over a lower, 110/72, for example. It is stated as "110 over 72." The ideal is below 120 for the top number (systolic) and under 80 for the bottom (diastolic). Write down your blood pressure today. You should pay close attention to your blood pressure if you have certain conditions such as:  Heart failure.   Prior heart attack.   Diabetes   Chronic kidney disease.   Prior stroke.   Multiple risk factors for heart disease.  To see if you have HTN, your blood pressure should be measured while you are seated with your arm held at the level of the heart. It should be measured at least twice. A one-time elevated blood pressure reading (especially in the Emergency Department) does not mean that you need treatment. There may be conditions in which the blood pressure is different between your right and left arms. It is important to see your caregiver soon for a recheck. Most people have essential hypertension which means that there is not a specific cause. This type of high blood pressure may be lowered by changing lifestyle factors such as:  Stress.   Smoking.   Lack of exercise.   Excessive weight.   Drug/tobacco/alcohol use.   Eating less salt.  Most people do not have symptoms from high blood pressure until it has caused damage to the body. Effective treatment can often prevent, delay or reduce that damage. TREATMENT  When a cause has been identified, treatment for high blood pressure is  directed at the cause. There are a large number of medications to treat HTN. These fall into several categories, and your caregiver will help you select the medicines that are best for you. Medications may have side effects. You should review side effects with your caregiver. If your blood pressure stays high after you have made lifestyle changes or started on medicines,   Your medication(s) may need to be changed.   Other problems may need to be addressed.   Be certain you understand your prescriptions, and know how and when to take your medicine.   Be sure to follow up with your caregiver within the time frame advised (usually within two weeks) to have your blood pressure rechecked and to review your medications.   If you are taking more than one medicine to lower your blood pressure, make sure you know how and at what times they should be taken. Taking two medicines at the same time can result in blood pressure that is too low.  SEEK IMMEDIATE MEDICAL CARE IF:  You develop a severe headache, blurred or changing vision, or confusion.   You have unusual weakness or numbness, or a faint feeling.   You have severe chest or abdominal pain, vomiting, or breathing problems.  MAKE SURE YOU:   Understand these instructions.   Will watch your condition.   Will get help right away if you are not doing well or get worse.  Document Released: 07/24/2005 Document Revised: 04/05/2011 Document Reviewed:   03/13/2008 [employer] Patient Information 2012 [place], [place].  Hyperglycemia Hyperglycemia occurs when the glucose (sugar) in your blood is too high. Hyperglycemia can happen for many reasons, but it most often happens to people who do not know they have diabetes or are not managing their diabetes properly.  CAUSES  Whether you have diabetes or not, there are other causes of hyperglycemia. Hyperglycemia can occur when you have diabetes, but it can also occur in other situations that you might not be  as aware of, such as: Diabetes  If you have diabetes and are having problems controlling your blood glucose, hyperglycemia could occur because of some of the following reasons:   Not following your meal plan.   Not taking your diabetes medications or not taking it properly.   Exercising less or doing less activity than you normally do.   Being sick.  Pre-diabetes  This cannot be ignored. Before people develop Type 2 diabetes, they almost always have "pre-diabetes." This is when your blood glucose levels are higher than normal, but not yet high enough to be diagnosed as diabetes. Research has shown that some long-term damage to the body, especially the heart and circulatory system, may already be occurring during pre-diabetes. If you take action to manage your blood glucose when you have pre-diabetes, you may delay or prevent Type 2 diabetes from developing.  Stress  If you have diabetes, you may be "diet" controlled or on oral medications or insulin to control your diabetes. However, you may find that your blood glucose is higher than usual in the hospital whether you have diabetes or not. This is often referred to as "stress hyperglycemia." Stress can elevate your blood glucose. This happens because of hormones put out by the body during times of stress. If stress has been the cause of your high blood glucose, it can be followed regularly by your caregiver. That way he/she can make sure your hyperglycemia does not continue to get worse or progress to diabetes.  Steroids  Steroids are medications that act on the infection fighting system (immune system) to block inflammation or infection. One side effect can be a rise in blood glucose. Most people can produce enough extra insulin to allow for this rise, but for those who cannot, steroids make blood glucose levels go even higher. It is not unusual for steroid treatments to "uncover" diabetes that is developing. It is not always possible to  determine if the hyperglycemia will go away after the steroids are stopped. A special blood test called an A1c is sometimes done to determine if your blood glucose was elevated before the steroids were started.  SYMPTOMS  Thirsty.   Frequent urination.   Dry mouth.   Blurred vision.   Tired or fatigue.   Weakness.   Sleepy.   Tingling in feet or leg.  DIAGNOSIS  Diagnosis is made by monitoring blood glucose in one or all of the following ways:  A1c test. This is a chemical found in your blood.   Fingerstick blood glucose monitoring.   Laboratory results.  TREATMENT  First, knowing the cause of the hyperglycemia is important before the hyperglycemia can be treated. Treatment may include, but is not be limited to:  Education.   Change or adjustment in medications.   Change or adjustment in meal plan.   Treatment for an illness, infection, etc.   More frequent blood glucose monitoring.   Change in exercise plan.   Decreasing or stopping steroids.   Lifestyle changes.  HOME  CARE INSTRUCTIONS   Test your blood glucose as directed.   Exercise regularly. Your caregiver will give you instructions about exercise. Pre-diabetes or diabetes which comes on with stress is helped by exercising.   Eat wholesome, balanced meals. Eat often and at regular, fixed times. Your caregiver or nutritionist will give you a meal plan to guide your sugar intake.   Being at an ideal weight is important. If needed, losing as little as 10 to 15 pounds may help improve blood glucose levels.  SEEK MEDICAL CARE IF:   You have questions about medicine, activity, or diet.   You continue to have symptoms (problems such as increased thirst, urination, or weight gain).  SEEK IMMEDIATE MEDICAL CARE IF:   You are vomiting or have diarrhea.   Your breath smells fruity.   You are breathing faster or slower.   You are very sleepy or incoherent.   You have numbness, tingling, or pain in your  feet or hands.   You have chest pain.   Your symptoms get worse even though you have been following your caregiver's orders.   If you have any other questions or concerns.  Document Released: 01/17/2001 Document Revised: 04/05/2011 Document Reviewed: 03/15/2009 [facility] Patient Information 2012 [place], [place].

## 2011-09-29 ENCOUNTER — Ambulatory Visit: Payer: BC Managed Care – PPO

## 2011-10-09 ENCOUNTER — Ambulatory Visit
Admission: RE | Admit: 2011-10-09 | Discharge: 2011-10-09 | Disposition: A | Discharge status: Home / Self Care | Payer: BC Managed Care – PPO | Source: Ambulatory Visit | Attending: Family Medicine | Admitting: Family Medicine

## 2011-10-09 DIAGNOSIS — R928 Other abnormal and inconclusive findings on diagnostic imaging of breast: Secondary | ICD-10-CM

## 2012-01-09 ENCOUNTER — Telehealth: Payer: Self-pay | Admitting: Family Medicine

## 2012-01-09 DIAGNOSIS — I10 Essential (primary) hypertension: Secondary | ICD-10-CM

## 2012-01-09 MED ORDER — AMLODIPINE BESYLATE 5 MG PO TABS: 5.0000 mg | ORAL_TABLET | Freq: Every day | ORAL | Status: DC

## 2012-01-09 NOTE — Telephone Encounter (Signed)
Refill: Amlodipine besylate 5 mg tab. Take 1 tablet by mouth daily. Qty 30. Last fill 12-14-11 

## 2012-04-09 ENCOUNTER — Telehealth: Payer: Self-pay | Admitting: Family Medicine

## 2012-04-09 DIAGNOSIS — E039 Hypothyroidism, unspecified: Secondary | ICD-10-CM

## 2012-04-09 DIAGNOSIS — E785 Hyperlipidemia, unspecified: Secondary | ICD-10-CM

## 2012-04-09 NOTE — Telephone Encounter (Signed)
She is due for these labs --Recheck labs in 6 months.----- LDL improved---con't diet and exercise----272.4 lipid, hep but is requesting a TSH. Please advise    KP

## 2012-04-09 NOTE — Telephone Encounter (Signed)
Ok to order TSH.  

## 2012-04-09 NOTE — Telephone Encounter (Signed)
Pt wants thyroid re-ck, if ok put in orders I will call back to schedule even though she refused to speak with me Pt also noted she thinks she has pink eye  Cb# [phone]

## 2012-04-09 NOTE — Telephone Encounter (Signed)
Orders are in, please schedule.    KP 

## 2012-04-09 NOTE — Telephone Encounter (Signed)
Made appt for labs  Advised she needed appt for pink eye eval-pt stated it seems to be better will call back if needed

## 2012-04-12 ENCOUNTER — Other Ambulatory Visit (INDEPENDENT_AMBULATORY_CARE_PROVIDER_SITE_OTHER): Payer: BC Managed Care – PPO

## 2012-04-12 ENCOUNTER — Encounter: Payer: Self-pay | Admitting: Family Medicine

## 2012-04-12 ENCOUNTER — Ambulatory Visit (INDEPENDENT_AMBULATORY_CARE_PROVIDER_SITE_OTHER): Payer: BC Managed Care – PPO | Admitting: Family Medicine

## 2012-04-12 VITALS — BP 152/94 | HR 80 | Temp 98.6°F | Wt 199.8 lb

## 2012-04-12 DIAGNOSIS — E039 Hypothyroidism, unspecified: Secondary | ICD-10-CM

## 2012-04-12 DIAGNOSIS — R5381 Other malaise: Secondary | ICD-10-CM

## 2012-04-12 DIAGNOSIS — R5383 Other fatigue: Secondary | ICD-10-CM

## 2012-04-12 DIAGNOSIS — G4730 Sleep apnea, unspecified: Secondary | ICD-10-CM

## 2012-04-12 DIAGNOSIS — I10 Essential (primary) hypertension: Secondary | ICD-10-CM

## 2012-04-12 DIAGNOSIS — E785 Hyperlipidemia, unspecified: Secondary | ICD-10-CM

## 2012-04-12 LAB — BASIC METABOLIC PANEL
BUN: 12 mg/dL (ref 6–23)
Chloride: 105 mEq/L (ref 96–112)
Glucose, Bld: 174 mg/dL — ABNORMAL HIGH (ref 70–99)
Potassium: 4.3 mEq/L (ref 3.5–5.1)

## 2012-04-12 LAB — CBC WITH DIFFERENTIAL/PLATELET
Basophils Relative: 0.8 % (ref 0.0–3.0)
HCT: 37.9 % (ref 36.0–46.0)
Hemoglobin: 12.2 g/dL (ref 12.0–15.0)
Lymphocytes Relative: 32.8 % (ref 12.0–46.0)
MCHC: 32.2 g/dL (ref 30.0–36.0)
Monocytes Relative: 9.6 % (ref 3.0–12.0)
Neutro Abs: 4.2 10*3/uL (ref 1.4–7.7)
RBC: 5.01 Mil/uL (ref 3.87–5.11)

## 2012-04-12 LAB — TSH: TSH: 1.11 u[IU]/mL (ref 0.35–5.50)

## 2012-04-12 LAB — HEPATIC FUNCTION PANEL
Albumin: 3.9 g/dL (ref 3.5–5.2)
Alkaline Phosphatase: 67 U/L (ref 39–117)
Total Bilirubin: 0.4 mg/dL (ref 0.3–1.2)

## 2012-04-12 LAB — LIPID PANEL: Total CHOL/HDL Ratio: 4

## 2012-04-12 MED ORDER — AMLODIPINE BESYLATE 5 MG PO TABS: 5.0000 mg | ORAL_TABLET | Freq: Every day | ORAL | Status: DC

## 2012-04-12 NOTE — Patient Instructions (Addendum)

## 2012-04-12 NOTE — Progress Notes (Signed)
  Subjective:     Danielle Flores is a 46 y.o. female who presents for evaluation of fatigue. Symptoms began about a month ago. The patient feels the fatigue began with: pt working 3rd shift and with period last month. Symptoms of her fatigue have been fatigue with paradoxical insomnia and general malaise. Patient describes the following psychological symptoms: stress at work. Patient denies change in hair texture, cold intolerance, constipation, excessive menstrual bleeding, exercise intolerance, fever, GI blood loss, significant change in weight, symptoms of arthritis and unusual rashes. Symptoms have gradually worsened. Symptom severity: struggles to carry out day to day responsibilities.. Previous visits for this problem: none.   The following portions of the patient's history were reviewed and updated as appropriate: allergies, current medications, past family history, past medical history, past social history, past surgical history and problem list.  Review of Systems Pertinent items are noted in HPI.    Objective:    BP 152/94  Pulse 80  Temp 98.6 F (37 C) (Oral)  Wt 199 lb 12.8 oz (90.629 kg)  SpO2 99% General appearance: alert, cooperative, appears stated age and no distress Throat: lips, mucosa, and tongue normal; teeth and gums normal Neck: no adenopathy, supple, symmetrical, trachea midline and thyroid not enlarged, symmetric, no tenderness/mass/nodules Lungs: clear to auscultation bilaterally Heart: S1, S2 normal  + murmur Extremities: extremities normal, atraumatic, no cyanosis or edema psych--  no depression , or anxiety    Assessment:    fatigue----? sleep apnea vs shift work   htn-- pt had salty meal today and just got back from vacation.  She has been checking bp at home and it has always been good--- will hold off on adjusting medication Plan:    Discussed diagnosis with patient. See orders for lab evaluation. Referral to pulmonary for sleep evaluation.

## 2012-04-12 NOTE — Progress Notes (Signed)
Labs only

## 2012-04-26 ENCOUNTER — Other Ambulatory Visit (INDEPENDENT_AMBULATORY_CARE_PROVIDER_SITE_OTHER): Payer: BC Managed Care – PPO

## 2012-04-26 DIAGNOSIS — R7989 Other specified abnormal findings of blood chemistry: Secondary | ICD-10-CM

## 2012-04-26 NOTE — Progress Notes (Signed)
Labs only

## 2012-04-30 ENCOUNTER — Telehealth: Payer: Self-pay | Admitting: Family Medicine

## 2012-05-09 ENCOUNTER — Institutional Professional Consult (permissible substitution): Payer: BC Managed Care – PPO | Admitting: Pulmonary Disease

## 2012-08-21 ENCOUNTER — Other Ambulatory Visit: Payer: Self-pay | Admitting: Family Medicine

## 2012-08-21 DIAGNOSIS — Z1231 Encounter for screening mammogram for malignant neoplasm of breast: Secondary | ICD-10-CM

## 2012-09-03 ENCOUNTER — Telehealth: Payer: Self-pay | Admitting: Family Medicine

## 2012-09-03 NOTE — Telephone Encounter (Signed)
Spoke with patient and she stated she was taking 2 of the Metoprolol together and her BP was better, she wanted to know if she can take the 2 metoprolol together with the Amlodipine or should she take it as directed.  Her BP is higher when she does not take the medication together. Her BP today was 186/121 and pulse was 85 when she took metoprolol at 2 pm it came down to 128/95 and pulse was 70.       Danielle Flores

## 2012-09-03 NOTE — Telephone Encounter (Signed)
Patient would like to discuss her bp meds with someone. She has changed the dosage herself and states that it works better. CB# [phone]

## 2012-09-04 NOTE — Telephone Encounter (Signed)
i t is a bid med----  Try 1 1/2 tab bid first and ov 2 weeks

## 2012-09-05 NOTE — Telephone Encounter (Signed)
Spoke with patient and she has been made aware to take the Metoprolol 1 1/2 bid and she voiced understanding, she will also take the Amlodipine as directed. Her apt has been scheduled and she agreed to follow up sooner if needed.       KP

## 2012-09-19 ENCOUNTER — Ambulatory Visit: Payer: BC Managed Care – PPO | Admitting: Family Medicine

## 2012-09-23 ENCOUNTER — Ambulatory Visit
Admission: RE | Admit: 2012-09-23 | Discharge: 2012-09-23 | Disposition: A | Discharge status: Home / Self Care | Payer: BC Managed Care – PPO | Source: Ambulatory Visit | Attending: Family Medicine | Admitting: Family Medicine

## 2012-09-23 DIAGNOSIS — Z1231 Encounter for screening mammogram for malignant neoplasm of breast: Secondary | ICD-10-CM

## 2012-10-04 ENCOUNTER — Encounter: Payer: Self-pay | Admitting: Lab

## 2012-10-07 ENCOUNTER — Ambulatory Visit (INDEPENDENT_AMBULATORY_CARE_PROVIDER_SITE_OTHER): Payer: BC Managed Care – PPO | Admitting: Family Medicine

## 2012-10-07 ENCOUNTER — Encounter: Payer: Self-pay | Admitting: Family Medicine

## 2012-10-07 VITALS — BP 134/82 | HR 77 | Temp 97.5°F | Wt 199.2 lb

## 2012-10-07 DIAGNOSIS — R7309 Other abnormal glucose: Secondary | ICD-10-CM

## 2012-10-07 DIAGNOSIS — I10 Essential (primary) hypertension: Secondary | ICD-10-CM

## 2012-10-07 LAB — HEPATIC FUNCTION PANEL
Bilirubin, Direct: 0 mg/dL (ref 0.0–0.3)
Total Bilirubin: 0.2 mg/dL — ABNORMAL LOW (ref 0.3–1.2)
Total Protein: 7.7 g/dL (ref 6.0–8.3)

## 2012-10-07 LAB — BASIC METABOLIC PANEL
Calcium: 9.1 mg/dL (ref 8.4–10.5)
Creatinine, Ser: 0.8 mg/dL (ref 0.4–1.2)
GFR: 93.94 mL/min (ref >60.00)
Glucose, Bld: 147 mg/dL — ABNORMAL HIGH (ref 70–99)
Sodium: 137 mEq/L (ref 135–145)

## 2012-10-07 LAB — POCT URINALYSIS DIPSTICK
Blood, UA: NEGATIVE
Glucose, UA: NEGATIVE
Protein, UA: NEGATIVE
Spec Grav, UA: 1.01
Urobilinogen, UA: 0.2
pH, UA: 7

## 2012-10-07 LAB — LIPID PANEL
Cholesterol: 167 mg/dL (ref 0–200)
HDL: 36.1 mg/dL — ABNORMAL LOW (ref >39.00)
Total CHOL/HDL Ratio: 5
Triglycerides: 167 mg/dL — ABNORMAL HIGH (ref 0.0–149.0)

## 2012-10-07 MED ORDER — METOPROLOL TARTRATE 100 MG PO TABS: 100.0000 mg | ORAL_TABLET | Freq: Two times a day (BID) | ORAL | Status: DC

## 2012-10-07 MED ORDER — AMLODIPINE BESYLATE 5 MG PO TABS: 5.0000 mg | ORAL_TABLET | Freq: Every day | ORAL | Status: DC

## 2012-10-07 NOTE — Patient Instructions (Addendum)

## 2012-10-07 NOTE — Progress Notes (Signed)
  Subjective:    Patient here for follow-up of elevated blood pressure.  She is exercising when she can and is adherent to a low-salt diet.  Blood pressure is well controlled at home. Cardiac symptoms: none. Patient denies: chest pain, chest pressure/discomfort, claudication, dyspnea, exertional chest pressure/discomfort, fatigue, irregular heart beat, lower extremity edema, near-syncope, orthopnea, palpitations, paroxysmal nocturnal dyspnea, syncope and tachypnea. Cardiovascular risk factors: hypertension, obesity (BMI >= 30 kg/m2) and sedentary lifestyle. Use of agents associated with hypertension: none. History of target organ damage: none.  The following portions of the patient's history were reviewed and updated as appropriate: allergies, current medications, past family history, past medical history, past social history, past surgical history and problem list.  Review of Systems Pertinent items are noted in HPI.     Objective:    BP 134/82  Pulse 77  Temp(Src) 97.5 F (36.4 C) (Oral)  Wt 199 lb 3.2 oz (90.357 kg)  BMI 31.67 kg/m2  SpO2 99% General appearance: alert, cooperative, appears stated age and no distress Lungs: clear to auscultation bilaterally Heart: S1, S2 normal Extremities: extremities normal, atraumatic, no cyanosis or edema    Assessment:    Hypertension, normal blood pressure . Evidence of target organ damage: none.    Plan:    Medication: no change. Dietary sodium restriction. Regular aerobic exercise. Check blood pressures 2-3 times weekly and record. Follow up: 6 months and as needed.

## 2012-10-15 MED ORDER — METFORMIN HCL ER 500 MG PO TB24: 500.0000 mg | ORAL_TABLET | Freq: Every day | ORAL | Status: DC

## 2012-10-16 ENCOUNTER — Ambulatory Visit: Payer: BC Managed Care – PPO

## 2012-11-13 IMAGING — US US SOFT TISSUE HEAD/NECK
1 series · 14 of 25 positions shown · non-contrast
Comparison: 05/15/2005

CLINICAL DATA: Thyromegaly.

THYROID ULTRASOUND
TECHNIQUE: Ultrasound examination of the thyroid gland and adjacent
soft tissues was performed.

[Series 1: us soft tissue head/neck · 0.08mm/px · 14 of 38 slices shown]
[im 1/38]
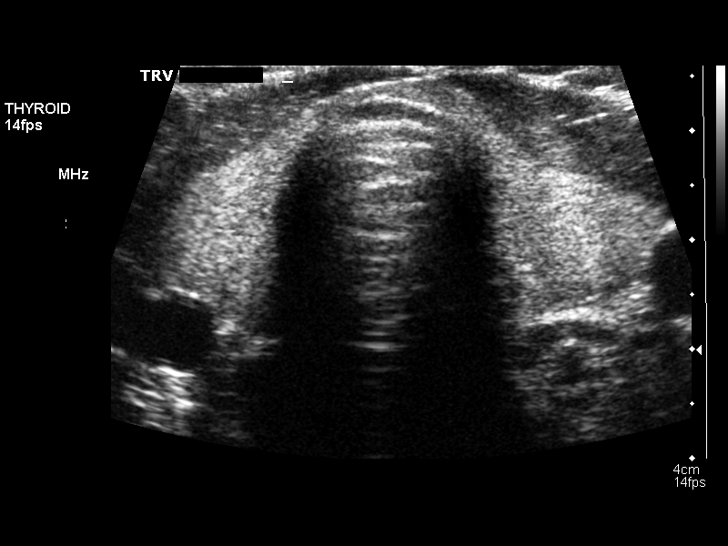
[im 4/38]
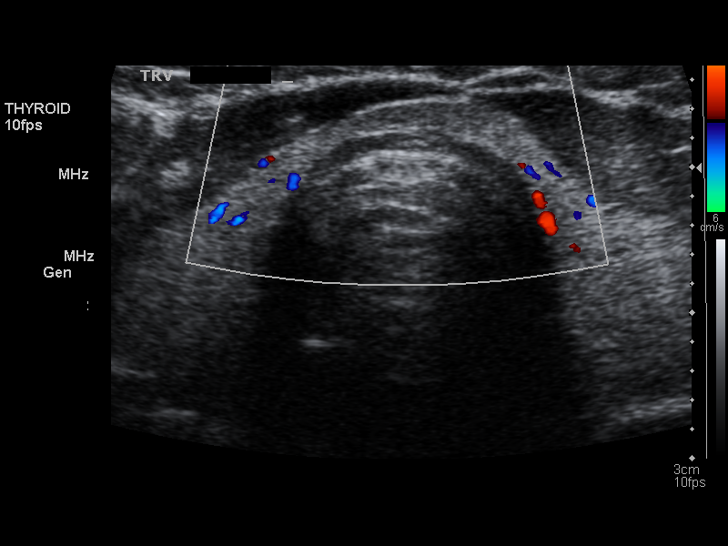
[im 7/38]
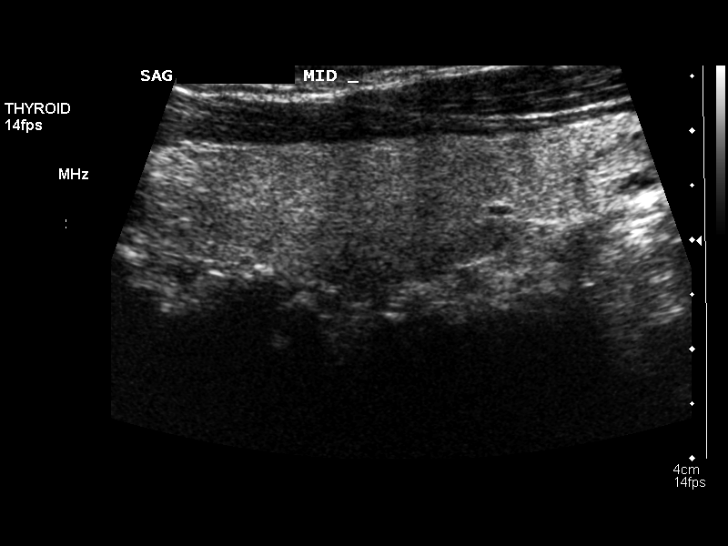
[im 10/38]
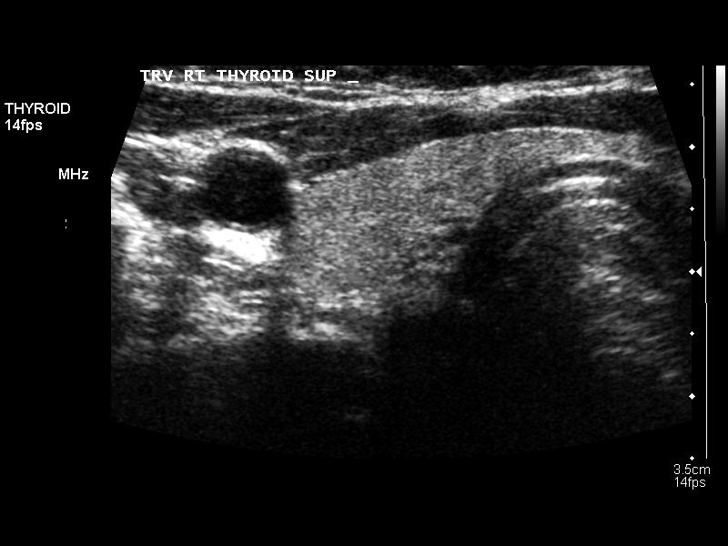
[im 13/38]
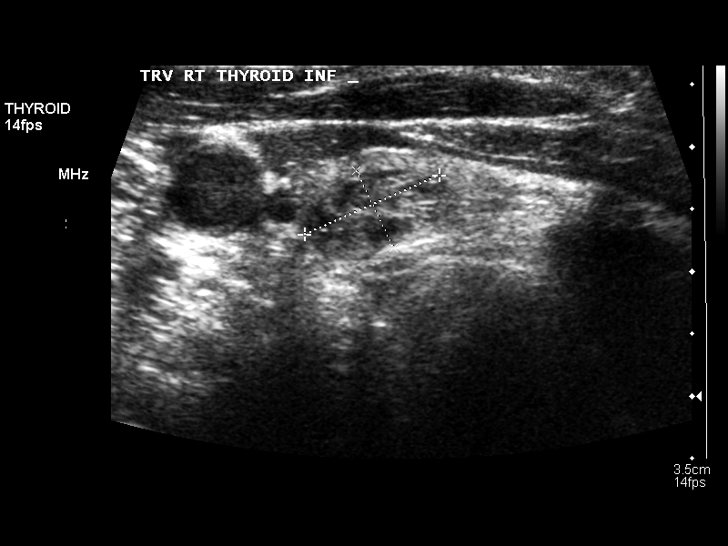
[im 14/38]
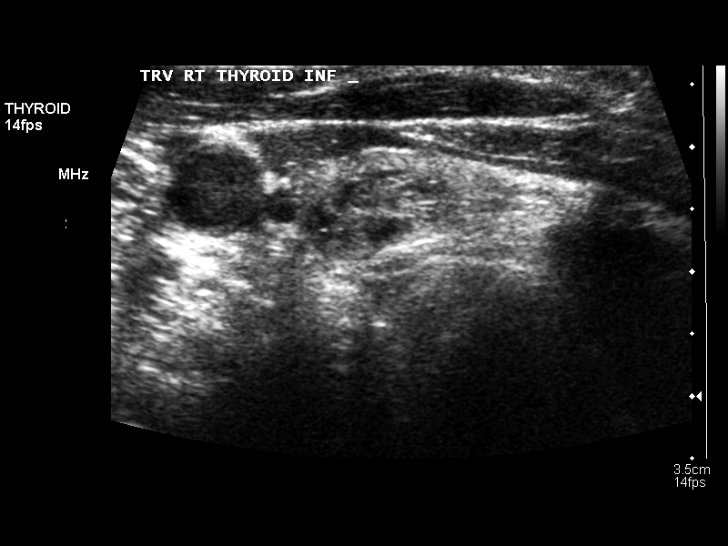
[im 17/38]
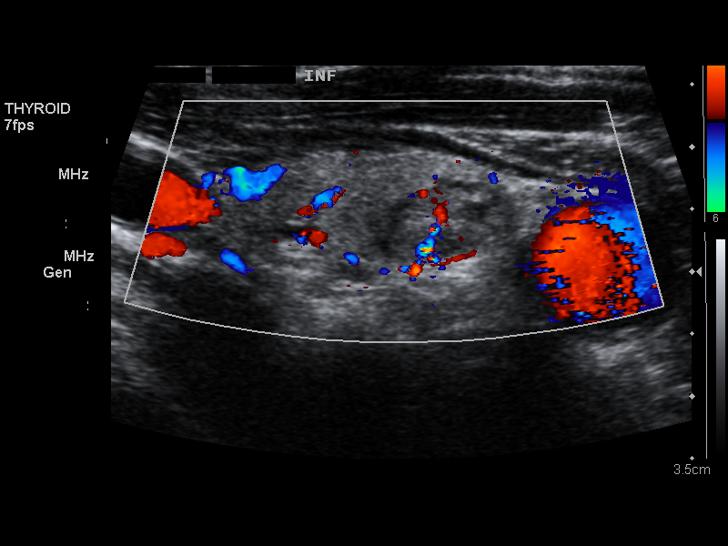
[im 21/38]
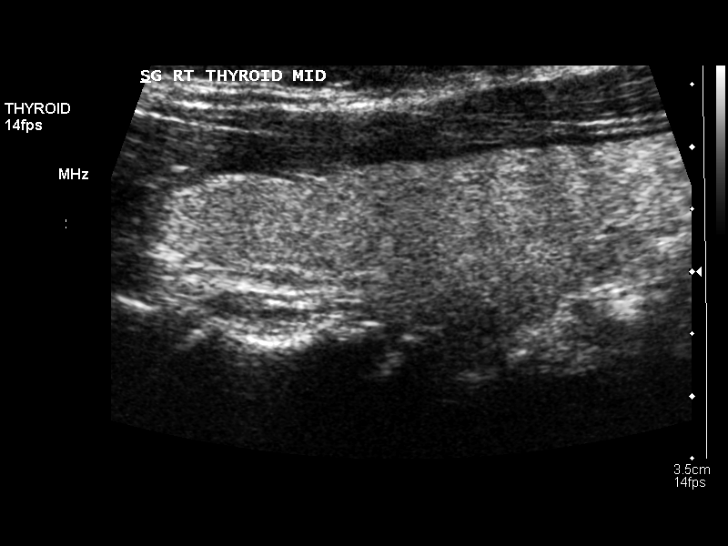
[im 24/38]
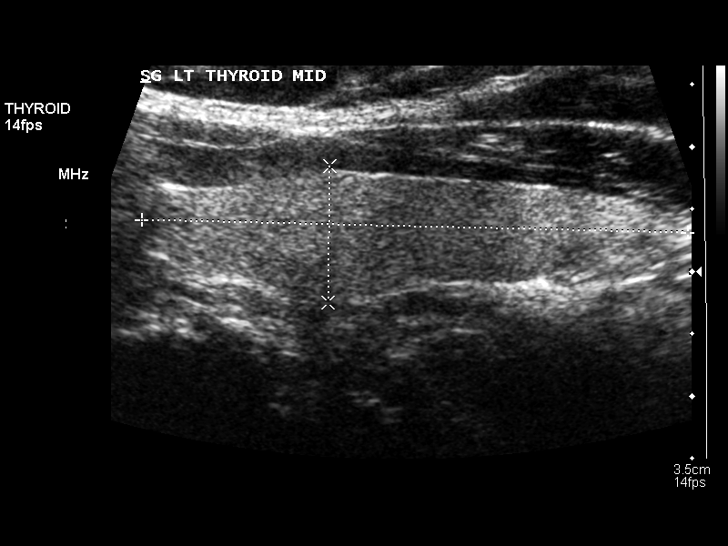
[im 25/38]
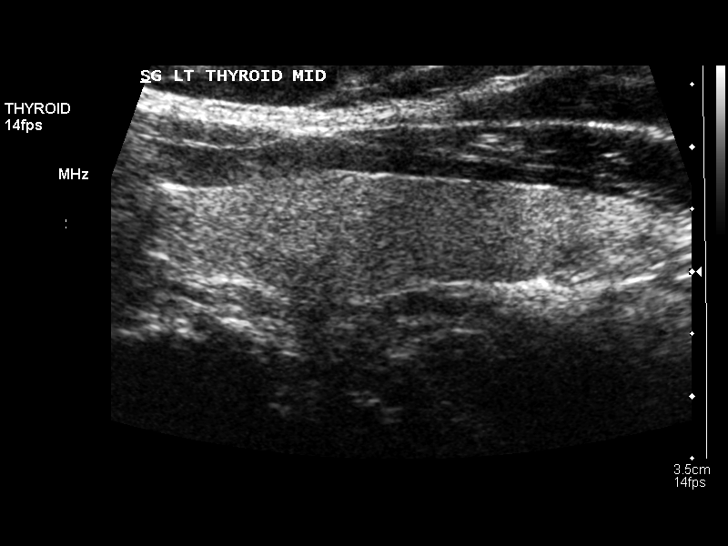
[im 28/38]
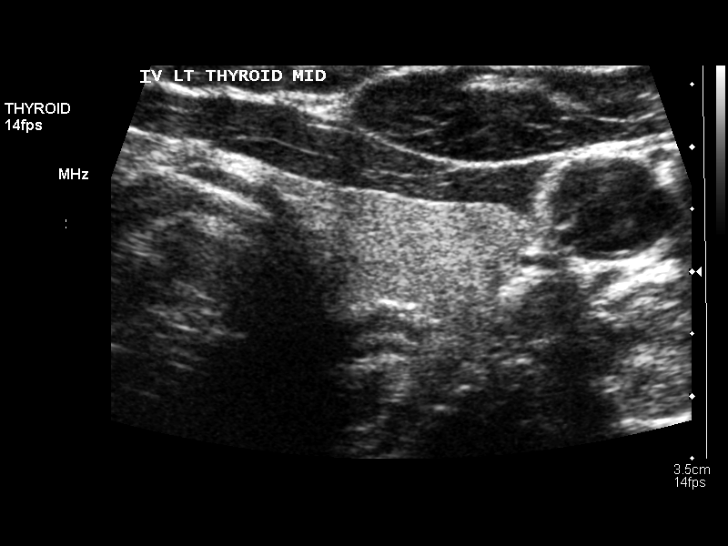
[im 31/38]
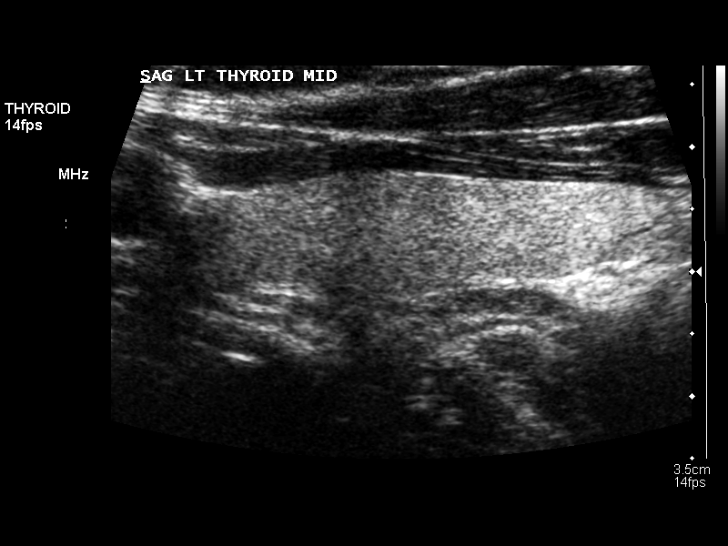
[im 34/38]
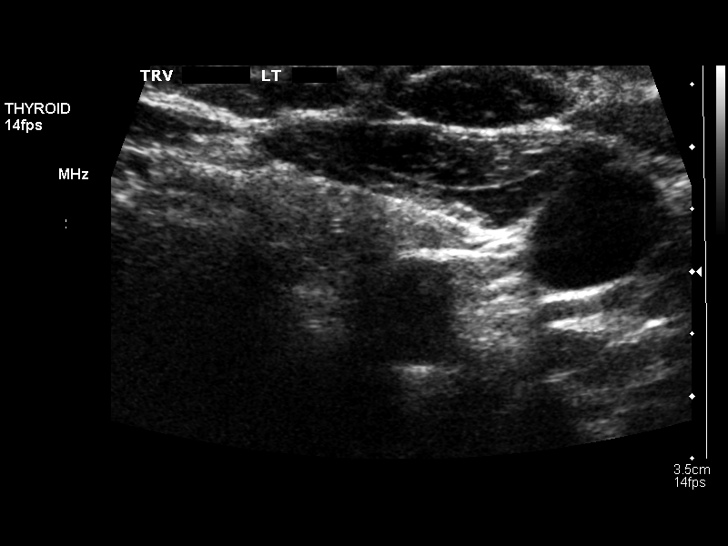
[im 38/38]
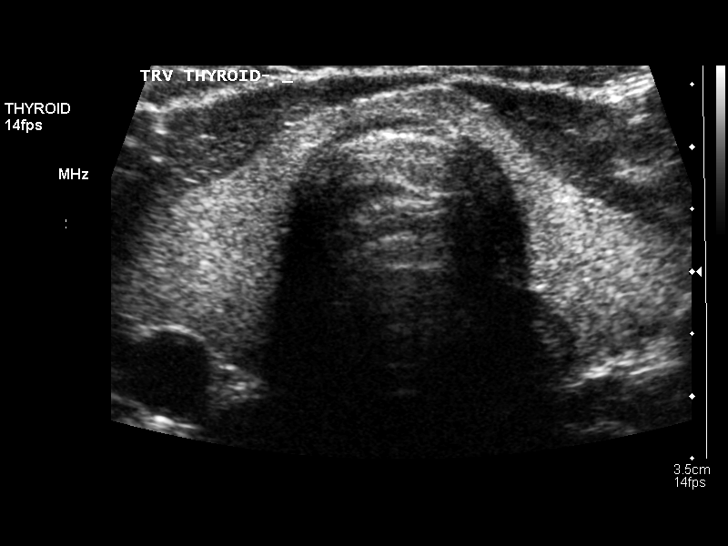

[14 of 25 positions shown; findings below may reference images not displayed]

FINDINGS: Right thyroid lobe:  4.9 x 1.6 x 1.7 cm.
Left thyroid lobe:  4.4 x 1.1 x 1.4 cm.
Isthmus:  0.2 cm.

Focal nodules:  New 1.2 x 0.7 x 1.3 cm inhomogeneous nodule in the
inferior aspect of the right lobe.

Lymphadenopathy:  None visualized.
IMPRESSION: New 1.3 cm inhomogeneous nodule in the inferior aspect of the right
lobe of the thyroid gland. Lesion does not fit criteria for fine
needle aspiration biopsy.  Otherwise normal exam.  Thyroid gland is
slightly prominent but essentially unchanged in size.

## 2013-01-16 ENCOUNTER — Ambulatory Visit (INDEPENDENT_AMBULATORY_CARE_PROVIDER_SITE_OTHER): Payer: BC Managed Care – PPO | Admitting: Family Medicine

## 2013-01-16 ENCOUNTER — Encounter: Payer: Self-pay | Admitting: Family Medicine

## 2013-01-16 VITALS — BP 144/90 | HR 74 | Temp 98.7°F | Wt 196.0 lb

## 2013-01-16 DIAGNOSIS — E118 Type 2 diabetes mellitus with unspecified complications: Secondary | ICD-10-CM

## 2013-01-16 DIAGNOSIS — R319 Hematuria, unspecified: Secondary | ICD-10-CM

## 2013-01-16 DIAGNOSIS — IMO0002 Reserved for concepts with insufficient information to code with codable children: Secondary | ICD-10-CM

## 2013-01-16 DIAGNOSIS — I10 Essential (primary) hypertension: Secondary | ICD-10-CM

## 2013-01-16 DIAGNOSIS — E1165 Type 2 diabetes mellitus with hyperglycemia: Secondary | ICD-10-CM

## 2013-01-16 LAB — LIPID PANEL
Cholesterol: 177 mg/dL (ref 0–200)
HDL: 38.1 mg/dL — ABNORMAL LOW (ref >39.00)
Triglycerides: 98 mg/dL (ref 0.0–149.0)
VLDL: 19.6 mg/dL (ref 0.0–40.0)

## 2013-01-16 LAB — CBC WITH DIFFERENTIAL/PLATELET
Basophils Absolute: 0.1 10*3/uL (ref 0.0–0.1)
HCT: 33.6 % — ABNORMAL LOW (ref 36.0–46.0)
Lymphs Abs: 2.2 10*3/uL (ref 0.7–4.0)
Monocytes Absolute: 0.3 10*3/uL (ref 0.1–1.0)
Monocytes Relative: 5.9 % (ref 3.0–12.0)
Platelets: 293 10*3/uL (ref 150.0–400.0)
RDW: 19.6 % — ABNORMAL HIGH (ref 11.5–14.6)

## 2013-01-16 LAB — POCT URINALYSIS DIPSTICK
Bilirubin, UA: NEGATIVE
Ketones, UA: NEGATIVE
Leukocytes, UA: NEGATIVE
Nitrite, UA: NEGATIVE
Protein, UA: NEGATIVE
pH, UA: 6.5

## 2013-01-16 LAB — BASIC METABOLIC PANEL
BUN: 12 mg/dL (ref 6–23)
GFR: 92.55 mL/min (ref >60.00)
Glucose, Bld: 110 mg/dL — ABNORMAL HIGH (ref 70–99)
Potassium: 4 mEq/L (ref 3.5–5.1)

## 2013-01-16 LAB — HEMOGLOBIN A1C: Hgb A1c MFr Bld: 6.2 % (ref 4.6–6.5)

## 2013-01-16 LAB — HEPATIC FUNCTION PANEL
Albumin: 3.6 g/dL (ref 3.5–5.2)
Alkaline Phosphatase: 71 U/L (ref 39–117)

## 2013-01-16 MED ORDER — FREESTYLE LITE TEST VI STRP: ORAL_STRIP | Status: DC

## 2013-01-16 MED ORDER — LISINOPRIL 5 MG PO TABS: 5.0000 mg | ORAL_TABLET | Freq: Every day | ORAL | Status: DC

## 2013-01-16 MED ORDER — METFORMIN HCL ER 500 MG PO TB24: 500.0000 mg | ORAL_TABLET | Freq: Every day | ORAL | Status: DC

## 2013-01-16 NOTE — Addendum Note (Signed)
Addended by: Silvio Pate D on: 01/16/2013 12:17 PM   Modules accepted: Orders

## 2013-01-16 NOTE — Assessment & Plan Note (Signed)
con't meds Running high today Add lisinopril 5 mg daily rto 3 months or sooner prn

## 2013-01-16 NOTE — Progress Notes (Signed)
  Subjective:     Danielle Flores is a 47 y.o. female who presents for follow up of diabetes.. Current symptoms include: none. Patient denies foot ulcerations, hyperglycemia, hypoglycemia , increased appetite, nausea, paresthesia of the feet, polydipsia, polyuria, visual disturbances, vomiting and weight loss. Evaluation to date has been: fasting blood sugar, fasting lipid panel, hemoglobin A1C and microalbuminuria. Home sugars: BGs consistently in an acceptable range. Current treatments: more intensive attention to diet which has been effective and Continued metformin which has been effective. Last dilated eye exam due. Pt is also here for f/u bp and labs    The following portions of the patient's history were reviewed and updated as appropriate: allergies, current medications, past family history, past medical history, past social history, past surgical history and problem list.  Review of Systems Pertinent items are noted in HPI.    Objective:    BP 144/90  Pulse 74  Temp(Src) 98.7 F (37.1 C) (Oral)  Wt 196 lb (88.905 kg)  BMI 31.16 kg/m2  SpO2 98% General appearance: alert, cooperative, appears stated age and no distress Eyes: conjunctivae/corneas clear. PERRL, EOM's intact. Fundi benign. Throat: lips, mucosa, and tongue normal; teeth and gums normal Neck: no adenopathy, supple, symmetrical, trachea midline and thyroid not enlarged, symmetric, no tenderness/mass/nodules Lungs: clear to auscultation bilaterally Heart: S1, S2 normal Extremities: extremities normal, atraumatic, no cyanosis or edema   Sensory exam of the foot is normal, tested with the monofilament. Good pulses, no lesions or ulcers, good peripheral pulses.    Patient was evaluated for proper footwear and sizing.  Laboratory: No components found with this basename: A1C      Lab Results  Component Value Date   HGBA1C 6.7* 10/07/2012    Assessment:    Diabetes mellitus Type II, under good control.    Plan:     Discussed general issues about diabetes pathophysiology and management. Addressed ADA diet. Discussed foot care. Reminded to get yearly retinal exam. Continued metformin; see  medication orders. Started ACE inhibitor; see medication orders. Labs: fasting blood sugar, fasting lipid panel, hemoglobin A1C and microalbuminuria. Reminded to bring in blood sugar diary at next visit. Follow up in 3 months or as needed.

## 2013-01-16 NOTE — Patient Instructions (Signed)
Diabetes and Standards of Medical Care  Diabetes is complicated. You may find that your diabetes team includes a dietitian, nurse, diabetes educator, eye doctor, and more. To help everyone know what is going on and to help you get the care you deserve, the following schedule of care was developed to help keep you on track. Below are the tests, exams, vaccines, medicines, education, and plans you will need. A1c test  Performed at least 2 times a year if you are meeting treatment goals.  Performed 4 times a year if therapy has changed or if you are not meeting treatment goals. Blood pressure test  Performed at every routine medical visit. The goal is less than 120/80 mmHg. Dental exam  Follow up with the dentist regularly. Eye exam  Diagnosed with type 1 diabetes as a child: Get an exam upon reaching the age of 10 years or older and having had diabetes for 3 5 years. Yearly eye exams are recommended after that initial eye exam.  Diagnosed with type 1 diabetes as an adult: Get an exam within 5 years of diagnosis and then yearly.  Diagnosed with type 2 diabetes: Get an exam as soon as possible after the diagnosis and then yearly. Foot care exam  Visual foot exams are performed at every routine medical visit. The exams check for cuts, injuries, or other problems with the feet.  A comprehensive foot exam should be done yearly. This includes visual inspection as well as assessing foot pulses and testing for loss of sensation. Kidney function test (urine microalbumin)  Performed once a year.  Type 1 diabetes: The first test is performed 5 years after diagnosis.  Type 2 diabetes: The first test is performed at the time of diagnosis.  A serum creatinine and estimated glomerular filtration rate (eGFR) test is done once a year to tell the level of chronic kidney disease (CKD), if present. Lipid profile (Cholesterol, HDL, LDL, Triglycerides)  Performed every 5 years for most people.  The  goal for LDL is less than 100 mg/dl. If at high risk, the goal is less than 70 mg/dl.  The goal for HDL is 40 mg/dl 50 mg/dl for men and 50 mg/dl 60 mg/dl for women. An HDL cholesterol of 60 mg/dL or higher gives some protection against heart disease.  The goal for triglycerides is less than 150 mg/dl. Influenza vaccine, pneumococcal vaccine, and hepatitis B vaccine  The influenza vaccine is recommended yearly.  The pneumococcal vaccine is generally given once in a lifetime. However, there are some instances when another vaccination is recommended. Check with your caregiver.  The hepatitis B vaccine is also recommended for adults with diabetes. Diabetes self-management education  Recommended at diagnosis and ongoing as needed. Treatment plan  Reviewed at every medical visit. Document Released: 05/21/2009 Document Revised: 07/10/2012 Document Reviewed: 01/24/2011 [employer] Patient Information 2014 [employer].  

## 2013-01-17 ENCOUNTER — Telehealth: Payer: Self-pay

## 2013-01-17 LAB — URINE CULTURE: Colony Count: 45000

## 2013-01-17 NOTE — Telephone Encounter (Signed)
Message copied by Arnette Norris on Fri Jan 17, 2013  8:52 AM ------      Message from: Lelon Perla      Created: Thu Jan 16, 2013  4:28 PM       Cholesterol is high.  Increased from last time.        Cholesterol--- LDL goal < 100,  HDL >40,  TG < 150.  Diet and exercise will increase HDL and decrease LDL and TG.  Fish,  Fish Oil, Flaxseed oil will also help increase the HDL and decrease Triglycerides.   Recheck labs in 3 months------should start med--      zocor 20 mg #30  1 po qhs , 2 refills             .             ------

## 2013-01-22 DIAGNOSIS — E1165 Type 2 diabetes mellitus with hyperglycemia: Secondary | ICD-10-CM

## 2013-01-22 DIAGNOSIS — E118 Type 2 diabetes mellitus with unspecified complications: Secondary | ICD-10-CM

## 2013-01-22 MED ORDER — SIMVASTATIN 20 MG PO TABS: 20.0000 mg | ORAL_TABLET | Freq: Every day | ORAL | Status: DC

## 2013-01-22 NOTE — Telephone Encounter (Signed)
Patient aware     KP 

## 2013-02-13 ENCOUNTER — Other Ambulatory Visit: Payer: Self-pay

## 2013-04-18 ENCOUNTER — Ambulatory Visit: Payer: BC Managed Care – PPO | Admitting: Family Medicine

## 2013-08-08 ENCOUNTER — Other Ambulatory Visit: Payer: Self-pay

## 2013-08-08 DIAGNOSIS — I10 Essential (primary) hypertension: Secondary | ICD-10-CM

## 2013-08-08 MED ORDER — AMLODIPINE BESYLATE 5 MG PO TABS: 5.0000 mg | ORAL_TABLET | Freq: Every day | ORAL | Status: DC

## 2013-10-09 ENCOUNTER — Other Ambulatory Visit: Payer: Self-pay

## 2013-10-09 DIAGNOSIS — I10 Essential (primary) hypertension: Secondary | ICD-10-CM

## 2013-10-09 MED ORDER — AMLODIPINE BESYLATE 5 MG PO TABS: 5.0000 mg | ORAL_TABLET | Freq: Every day | ORAL | Status: DC

## 2013-10-09 NOTE — Progress Notes (Signed)
Refill per protocol

## 2013-11-10 ENCOUNTER — Other Ambulatory Visit: Payer: Self-pay

## 2013-11-10 DIAGNOSIS — Z1231 Encounter for screening mammogram for malignant neoplasm of breast: Secondary | ICD-10-CM

## 2013-11-24 ENCOUNTER — Other Ambulatory Visit: Payer: Self-pay

## 2013-11-24 ENCOUNTER — Ambulatory Visit
Admission: RE | Admit: 2013-11-24 | Discharge: 2013-11-24 | Disposition: A | Discharge status: Home / Self Care | Payer: BC Managed Care – PPO | Source: Ambulatory Visit

## 2013-11-24 DIAGNOSIS — Z1231 Encounter for screening mammogram for malignant neoplasm of breast: Secondary | ICD-10-CM

## 2014-01-31 ENCOUNTER — Other Ambulatory Visit: Payer: Self-pay | Admitting: Family Medicine

## 2014-03-23 ENCOUNTER — Encounter: Payer: Self-pay | Admitting: Family Medicine

## 2014-03-23 ENCOUNTER — Ambulatory Visit (INDEPENDENT_AMBULATORY_CARE_PROVIDER_SITE_OTHER): Payer: BC Managed Care – PPO | Admitting: Family Medicine

## 2014-03-23 VITALS — BP 144/90 | HR 72 | Temp 98.6°F | Wt 193.0 lb

## 2014-03-23 DIAGNOSIS — E785 Hyperlipidemia, unspecified: Secondary | ICD-10-CM

## 2014-03-23 DIAGNOSIS — R739 Hyperglycemia, unspecified: Secondary | ICD-10-CM

## 2014-03-23 DIAGNOSIS — R7309 Other abnormal glucose: Secondary | ICD-10-CM

## 2014-03-23 DIAGNOSIS — I10 Essential (primary) hypertension: Secondary | ICD-10-CM

## 2014-03-23 LAB — BASIC METABOLIC PANEL
BUN: 10 mg/dL (ref 6–23)
CO2: 27 mEq/L (ref 19–32)
Calcium: 9.4 mg/dL (ref 8.4–10.5)
Chloride: 104 mEq/L (ref 96–112)
Creatinine, Ser: 1 mg/dL (ref 0.4–1.2)
GFR: 76.19 mL/min (ref >60.00)
Glucose, Bld: 112 mg/dL — ABNORMAL HIGH (ref 70–99)
POTASSIUM: 3.9 meq/L (ref 3.5–5.1)
SODIUM: 139 meq/L (ref 135–145)

## 2014-03-23 LAB — HEPATIC FUNCTION PANEL
ALT: 16 U/L (ref 0–35)
AST: 21 U/L (ref 0–37)
Albumin: 3.7 g/dL (ref 3.5–5.2)
Alkaline Phosphatase: 73 U/L (ref 39–117)
BILIRUBIN TOTAL: 0.4 mg/dL (ref 0.2–1.2)
Bilirubin, Direct: 0 mg/dL (ref 0.0–0.3)
Total Protein: 7.8 g/dL (ref 6.0–8.3)

## 2014-03-23 LAB — POCT URINALYSIS DIPSTICK
Bilirubin, UA: NEGATIVE
Glucose, UA: NEGATIVE
KETONES UA: NEGATIVE
Leukocytes, UA: NEGATIVE
Nitrite, UA: NEGATIVE
PH UA: 6
PROTEIN UA: NEGATIVE
RBC UA: NEGATIVE
Spec Grav, UA: 1.01
Urobilinogen, UA: 0.2

## 2014-03-23 LAB — LIPID PANEL
CHOLESTEROL: 180 mg/dL (ref 0–200)
HDL: 38.9 mg/dL — ABNORMAL LOW (ref >39.00)
LDL CALC: 119 mg/dL — AB (ref 0–99)
NonHDL: 141.1
Total CHOL/HDL Ratio: 5
Triglycerides: 109 mg/dL (ref 0.0–149.0)
VLDL: 21.8 mg/dL (ref 0.0–40.0)

## 2014-03-23 LAB — HEMOGLOBIN A1C: Hgb A1c MFr Bld: 6.4 % (ref 4.6–6.5)

## 2014-03-23 MED ORDER — AMLODIPINE BESYLATE 5 MG PO TABS: ORAL_TABLET | ORAL | Status: DC

## 2014-03-23 MED ORDER — METOPROLOL TARTRATE 100 MG PO TABS: 100.0000 mg | ORAL_TABLET | Freq: Two times a day (BID) | ORAL | Status: DC

## 2014-03-23 NOTE — Patient Instructions (Signed)

## 2014-03-23 NOTE — Progress Notes (Signed)
  Subjective:    Patient here for follow-up of elevated blood pressure.  She is exercising and is adherent to a low-salt diet.  Blood pressure is well controlled at home.--  110/86.   Cardiac symptoms: none. Patient denies: chest pain, chest pressure/discomfort, claudication, dyspnea, exertional chest pressure/discomfort, fatigue, irregular heart beat, lower extremity edema, near-syncope, orthopnea, palpitations, paroxysmal nocturnal dyspnea, syncope and tachypnea. Cardiovascular risk factors: dyslipidemia and hypertension. Use of agents associated with hypertension: none. History of target organ damage: none.  The following portions of the patient's history were reviewed and updated as appropriate: allergies, current medications, past family history, past medical history, past social history, past surgical history and problem list.  Review of Systems Pertinent items are noted in HPI.     Objective:    BP 144/90  Pulse 72  Temp(Src) 98.6 F (37 C) (Oral)  Wt 193 lb (87.544 kg)  SpO2 98%  LMP 02/23/2014 General appearance: alert, cooperative, appears stated age and no distress Throat: lips, mucosa, and tongue normal; teeth and gums normal Neck: no adenopathy, supple, symmetrical, trachea midline and thyroid not enlarged, symmetric, no tenderness/mass/nodules Lungs: clear to auscultation bilaterally Heart: S1, S2 normal Extremities: extremities normal, atraumatic, no cyanosis or edema    Assessment:    Hypertension, slightly elevated today. Evidence of target organ damage: none.    Plan:    Medication: no change. Dietary sodium restriction. Regular aerobic exercise. Check blood pressures 2-3 times weekly and record. Follow up: 3 months and as needed. ----- cpe Pt bp are running normal at home-- con't to check  1. Essential hypertension   - Basic metabolic panel - Hepatic function panel - Lipid panel - POCT urinalysis dipstick - Microalbumin / creatinine urine ratio -  metoprolol (LOPRESSOR) 100 MG tablet; Take 1 tablet (100 mg total) by mouth 2 (two) times daily. 1 1/2 tab po bid  Dispense: 270 tablet; Refill: 1 - amLODipine (NORVASC) 5 MG tablet; TAKE 1 TABLET (5 MG TOTAL) BY MOUTH DAILY.  Dispense: 90 tablet; Refill: 1  2. Other and unspecified hyperlipidemia Check labs today - Basic metabolic panel - Hepatic function panel - Lipid panel  3. Hyperglycemia   - Hemoglobin A1c

## 2014-03-23 NOTE — Progress Notes (Signed)
Pre visit review using our clinic review tool, if applicable. No additional management support is needed unless otherwise documented below in the visit note. 

## 2014-03-24 ENCOUNTER — Telehealth: Payer: Self-pay | Admitting: Family Medicine

## 2014-03-24 NOTE — Telephone Encounter (Signed)
Relevant patient education mailed to patient.  

## 2014-03-25 LAB — MICROALBUMIN / CREATININE URINE RATIO
Creatinine,U: 188.6 mg/dL
MICROALB/CREAT RATIO: 5 mg/g (ref 0.0–30.0)
Microalb, Ur: 9.4 mg/dL — ABNORMAL HIGH (ref 0.0–1.9)

## 2014-03-30 ENCOUNTER — Other Ambulatory Visit: Payer: Self-pay

## 2014-03-30 DIAGNOSIS — I10 Essential (primary) hypertension: Secondary | ICD-10-CM

## 2014-03-30 MED ORDER — METOPROLOL TARTRATE 100 MG PO TABS: 100.0000 mg | ORAL_TABLET | Freq: Two times a day (BID) | ORAL | Status: DC

## 2014-03-31 ENCOUNTER — Telehealth: Payer: Self-pay

## 2014-03-31 NOTE — Telephone Encounter (Signed)
She lowered it because she started having the symptoms after we increased the Metoprolol. She stared taking 50 mg of her husbands because she is out of town and ran out of her med's since she had to increase it. She wants to decrease the Metoprolol. Please advise     KP

## 2014-03-31 NOTE — Telephone Encounter (Signed)
Spoke with patient and she voiced understanding she will cut the 100 mg in half and call with her BP readings. She has agreed to go to UC if she has those symptoms again.      KP

## 2014-03-31 NOTE — Telephone Encounter (Signed)
Spoke with patient and she stated she is having side effects to the metoprolol, she is having palpitations, feeling fatigue and dizziness since increasing the metoprolol, no syncope, no chest pain, no Sob. She said she is out of town and has been taking her husbands 50 mg tab once daily with amlodipine and feels better on that. The patient does not want to take the metoprolol as prescribed due to her side effects. Please advise     KP

## 2014-03-31 NOTE — Telephone Encounter (Signed)
Syla [phone]  Chrisie would like for someone to call her back about her medicines. She did not want to tell me what was going on, except that it had been going on since she started taking her medicines

## 2014-03-31 NOTE — Telephone Encounter (Signed)
According to chart she was on metoprolol 100 mg bid and norvasc 5 mg-----  We increased metoprolol to 1 1/2--- she she lowered her med even lower than it was and norvasc she was taking--- so she needs to be checked somewhere where they can check her bp

## 2014-03-31 NOTE — Telephone Encounter (Signed)
Her bp was high when she was here----she still needs someone to check her bp-- she should go to UC wherever she is, The problem is her bp was high when she was here ---stay on 50 mg for now but she either needs appointment here or in uc

## 2014-09-04 ENCOUNTER — Other Ambulatory Visit: Payer: Self-pay

## 2014-09-04 DIAGNOSIS — I10 Essential (primary) hypertension: Secondary | ICD-10-CM

## 2014-09-04 MED ORDER — AMLODIPINE BESYLATE 5 MG PO TABS: ORAL_TABLET | ORAL | Status: DC

## 2014-09-10 ENCOUNTER — Telehealth: Payer: Self-pay

## 2014-09-10 NOTE — Telephone Encounter (Signed)
Spoke with patient and gave her Dr.Lowne recommendations and she verbalized understanding. She agreed to calling for payment arrangement but wanted her apt for when she came back from her trip. Apt scheduled for 10/06/14.     KP

## 2014-09-10 NOTE — Telephone Encounter (Signed)
Tell her about number to call at [facility] to get sliding scale---- maybe she can come in then

## 2014-09-10 NOTE — Telephone Encounter (Signed)
Call from patient and she stated that she is supposed to be on the 50 mg of the Metoprolol. I advised she needed a follow up with Dr.Lowne according to our previous call in 03/2014 she called and said the 100 mg of metoprolol was making her sick and she felt better when she took the 50 mg, said she was cutting the pills in half. She the wrong Rx was sent to the pharmacy, I made her aware we did not send the Metoprolol but we sent Amlodipine because this is what was requested and made her aware that she needs to follow up, she said she does not have any insurance at this time and she is working on getting some, she says her BP has been good but unable to give me any readings. He cuff at home did not work, she tried several time while on the phone. She is supposed to take 150mg  of the metoprolol BID but she is on taking 50 mg BID. She said she feels great and not having any troubles, she is also still taking the amlodipine.  I asked her to check her BP and give us a call back so we know that the medication is working she verbalized understanding and has agreed to do so. I made her aware I would seen if you had any other suggestions. Please advise    KP

## 2014-10-06 ENCOUNTER — Ambulatory Visit: Payer: Self-pay | Admitting: Family Medicine

## 2014-11-07 ENCOUNTER — Other Ambulatory Visit: Payer: Self-pay | Admitting: Family Medicine

## 2014-11-09 NOTE — Telephone Encounter (Signed)
Msg left advising OV due now.    KP

## 2017-06-25 ENCOUNTER — Other Ambulatory Visit: Payer: Self-pay | Admitting: Family Medicine

## 2017-06-25 ENCOUNTER — Encounter: Payer: Self-pay | Admitting: Family Medicine

## 2017-06-25 DIAGNOSIS — I10 Essential (primary) hypertension: Secondary | ICD-10-CM

## 2017-06-26 MED ORDER — AMLODIPINE BESYLATE 5 MG PO TABS: ORAL_TABLET | ORAL | 0 refills | Status: DC

## 2017-06-26 MED ORDER — METOPROLOL TARTRATE 100 MG PO TABS: 100.0000 mg | ORAL_TABLET | Freq: Two times a day (BID) | ORAL | 1 refills | Status: DC

## 2017-06-26 NOTE — Telephone Encounter (Signed)
Pt is asking for a refill on her blood pressure medication (LOPRESSOR) Pt states she still doesn't have insurance to come in for a visit

## 2017-07-19 ENCOUNTER — Ambulatory Visit: Payer: Self-pay | Admitting: Family Medicine

## 2017-07-24 ENCOUNTER — Encounter: Payer: Self-pay | Admitting: Family Medicine

## 2017-07-28 ENCOUNTER — Encounter: Payer: Self-pay | Admitting: Family Medicine

## 2017-07-30 ENCOUNTER — Encounter: Payer: Self-pay | Admitting: Medical

## 2017-07-30 ENCOUNTER — Ambulatory Visit (INDEPENDENT_AMBULATORY_CARE_PROVIDER_SITE_OTHER): Payer: Self-pay | Admitting: Medical

## 2017-07-30 ENCOUNTER — Encounter (HOSPITAL_BASED_OUTPATIENT_CLINIC_OR_DEPARTMENT_OTHER): Payer: Self-pay | Admitting: Emergency Medicine

## 2017-07-30 ENCOUNTER — Emergency Department (HOSPITAL_BASED_OUTPATIENT_CLINIC_OR_DEPARTMENT_OTHER)
Admission: EM | Admit: 2017-07-30 | Discharge: 2017-07-30 | Disposition: A | Discharge status: Home / Self Care | Payer: Self-pay | Attending: Emergency Medicine | Admitting: Emergency Medicine

## 2017-07-30 ENCOUNTER — Other Ambulatory Visit: Payer: Self-pay

## 2017-07-30 VITALS — BP 170/90 | HR 81 | Temp 98.2°F | Resp 16 | Ht 66.0 in | Wt 181.0 lb

## 2017-07-30 DIAGNOSIS — Z7982 Long term (current) use of aspirin: Secondary | ICD-10-CM | POA: Insufficient documentation

## 2017-07-30 DIAGNOSIS — R399 Unspecified symptoms and signs involving the genitourinary system: Secondary | ICD-10-CM

## 2017-07-30 DIAGNOSIS — I10 Essential (primary) hypertension: Secondary | ICD-10-CM | POA: Insufficient documentation

## 2017-07-30 DIAGNOSIS — R739 Hyperglycemia, unspecified: Secondary | ICD-10-CM

## 2017-07-30 DIAGNOSIS — Z7984 Long term (current) use of oral hypoglycemic drugs: Secondary | ICD-10-CM | POA: Insufficient documentation

## 2017-07-30 DIAGNOSIS — E785 Hyperlipidemia, unspecified: Secondary | ICD-10-CM

## 2017-07-30 DIAGNOSIS — E1165 Type 2 diabetes mellitus with hyperglycemia: Secondary | ICD-10-CM | POA: Insufficient documentation

## 2017-07-30 DIAGNOSIS — E119 Type 2 diabetes mellitus without complications: Secondary | ICD-10-CM

## 2017-07-30 LAB — BASIC METABOLIC PANEL WITH GFR
Anion gap: 16 — ABNORMAL HIGH (ref 5–15)
Anion gap: 12 (ref 5–15)
BUN: 10 mg/dL (ref 6–20)
BUN: 9 mg/dL (ref 6–20)
CO2: 23 mmol/L (ref 22–32)
CO2: 23 mmol/L (ref 22–32)
Calcium: 9.5 mg/dL (ref 8.9–10.3)
Calcium: 8.5 mg/dL — ABNORMAL LOW (ref 8.9–10.3)
Chloride: 96 mmol/L — ABNORMAL LOW (ref 101–111)
Chloride: 102 mmol/L (ref 101–111)
Creatinine, Ser: 0.88 mg/dL (ref 0.44–1.00)
Creatinine, Ser: 0.75 mg/dL (ref 0.44–1.00)
GFR calc Af Amer: >60 mL/min
GFR calc Af Amer: >60 mL/min
GFR calc non Af Amer: >60 mL/min
GFR calc non Af Amer: >60 mL/min
Glucose, Bld: 489 mg/dL — ABNORMAL HIGH (ref 65–99)
Glucose, Bld: 384 mg/dL — ABNORMAL HIGH (ref 65–99)
Potassium: 3.4 mmol/L — ABNORMAL LOW (ref 3.5–5.1)
Potassium: 3.3 mmol/L — ABNORMAL LOW (ref 3.5–5.1)
Sodium: 135 mmol/L (ref 135–145)
Sodium: 137 mmol/L (ref 135–145)

## 2017-07-30 LAB — URINALYSIS, ROUTINE W REFLEX MICROSCOPIC
Bilirubin Urine: NEGATIVE
Glucose, UA: >=500 mg/dL — AB
Hgb urine dipstick: NEGATIVE
Ketones, ur: 15 mg/dL — AB
Leukocytes, UA: NEGATIVE
Nitrite: NEGATIVE
Protein, ur: NEGATIVE mg/dL
Specific Gravity, Urine: <1.005 — ABNORMAL LOW (ref 1.005–1.030)
pH: 6 (ref 5.0–8.0)

## 2017-07-30 LAB — CBC
HCT: 27.4 % — ABNORMAL LOW (ref 36.0–46.0)
HEMOGLOBIN: 8.7 g/dL — AB (ref 12.0–15.0)
MCH: 20.3 pg — ABNORMAL LOW (ref 26.0–34.0)
MCHC: 31.8 g/dL (ref 30.0–36.0)
MCV: 63.9 fL — ABNORMAL LOW (ref 78.0–100.0)
Platelets: 467 10*3/uL — ABNORMAL HIGH (ref 150–400)
RBC: 4.29 MIL/uL (ref 3.87–5.11)
RDW: 18.3 % — ABNORMAL HIGH (ref 11.5–15.5)
WBC: 11.5 10*3/uL — AB (ref 4.0–10.5)

## 2017-07-30 LAB — CBG MONITORING, ED
GLUCOSE-CAPILLARY: 458 mg/dL — AB (ref 65–99)
Glucose-Capillary: 418 mg/dL — ABNORMAL HIGH (ref 65–99)

## 2017-07-30 LAB — POC URINALSYSI DIPSTICK (AUTOMATED)
Bilirubin, UA: NEGATIVE
Blood, UA: NEGATIVE
Ketones, UA: NEGATIVE
Leukocytes, UA: NEGATIVE
Nitrite, UA: NEGATIVE
Protein, UA: NEGATIVE
Spec Grav, UA: 1.01
Urobilinogen, UA: 0.2 U/dL
pH, UA: 6

## 2017-07-30 LAB — URINALYSIS, MICROSCOPIC (REFLEX): RBC / HPF: NONE SEEN RBC/hpf (ref 0–5)

## 2017-07-30 LAB — PREGNANCY, URINE: Preg Test, Ur: NEGATIVE

## 2017-07-30 MED ORDER — SODIUM CHLORIDE 0.9 % IV BOLUS (SEPSIS)
1000.0000 mL | Freq: Once | INTRAVENOUS | Status: AC
  Administered 2017-07-30: 1000 mL via INTRAVENOUS

## 2017-07-30 MED ORDER — METFORMIN HCL 500 MG PO TABS: 500.0000 mg | ORAL_TABLET | Freq: Two times a day (BID) | ORAL | 0 refills | Status: DC

## 2017-07-30 MED ORDER — INSULIN ASPART 100 UNIT/ML ~~LOC~~ SOLN
10.0000 [IU] | Freq: Once | SUBCUTANEOUS | Status: AC
  Administered 2017-07-30: 10 [IU] via SUBCUTANEOUS
  Filled 2017-07-30: qty 1

## 2017-07-30 MED FILL — metFORMIN HCL 500 MG TABS: 500 | 30 days supply | Qty: 60 | Fill #0

## 2017-07-30 NOTE — Patient Instructions (Addendum)
Your sugars is 509 with ketones in urine and recent weight loss over past year. Also your blood pressure is moderately elevated.  I have talked with the ED physician and made her aware that you were on your way.  You will likely need some labs, IV hydration and some insulin.  Follow-up with us later this week or early next week.  Still think it is a good idea to get labs done on Thursday morning to assess your A1c.  Often times emergency department do not check A1c's.  The urine did look clear today(except for glucose and ketones).  I wanted to get a culture to make sure no infection present in light of recent complaints.  Our lab is already closed so could give sample on Thursday.  Based on improvement of symptoms and UA did not give antibiotics.

## 2017-07-30 NOTE — ED Triage Notes (Signed)
Patient states that she went to her Dr upstairs because she was not feeling well. The patient has lost some weight and increased urinary problems

## 2017-07-30 NOTE — Discharge Instructions (Signed)
Please restart taking your metformin as prescribed. Drink plenty of fluids keep hydrated. Please call your primary care doctor's office for close follow-up regarding her blood sugar management. Return without fail for worsening symptoms, including confusion, intractable vomiting, fevers, extreme fatigue, or any other symptoms concerning to you.

## 2017-07-30 NOTE — Progress Notes (Signed)
Subjective:    Patient ID: Barnie MortCarolyn T Flores, female    DOB: 09/21/1965, 51 y.o.   MRN: [id number]  HPI  Pt in today reporting urinary symptoms  Dysuria- mild Frequent urination-more frequent urination. Hesitancy-no. Suprapubic pressure-occasional bladder pain. Fever-no chills-yes early on past Tuesday and thrusday. Nausea-yes. Only mild on wed. None since Vomiting-no CVA pain- left side stinging pain on and off. None since friday History of UTI- One infection after second child was born. Gross hematuria-None.  Pt states symptoms for 12 days.(worse symptoms early on).  LMP-July 16, 2017. Today she feels almost back to normal. Pt hydrate a lot and took cranberry tablets.  Pt did have some sugar in her urine. She had been on metformin in past. See ros. Pt does walk some daily. Not eating a lot of sugars recently. Overall describes good diet.    Review of Systems  HENT: Negative for sinus pressure.   Respiratory: Negative for cough, chest tightness, shortness of breath and wheezing.   Cardiovascular: Negative for chest pain and palpitations.  Gastrointestinal: Negative for anal bleeding, diarrhea and nausea.  Endocrine: Negative for polydipsia, polyphagia and polyuria.       No frequent urination except for recently.  Genitourinary: Negative for decreased urine volume, difficulty urinating, dysuria, flank pain, frequency, menstrual problem, urgency, vaginal discharge and vaginal pain.       See hpi.  No current symptoms.  Neurological: Negative for dizziness, seizures, weakness and light-headedness.  Hematological: Negative for adenopathy. Does not bruise/bleed easily.    Past Medical History:  Diagnosis Date  . Hyperglycemia   . Hyperlipidemia   . Hypertension      Social History   Socioeconomic History  . Marital status: Married    Spouse name: Not on file  . Number of children: Not on file  . Years of education: Not on file  . Highest education level: Not  on file  Social Needs  . Financial resource strain: Not on file  . Food insecurity - worry: Not on file  . Food insecurity - inability: Not on file  . Transportation needs - medical: Not on file  . Transportation needs - non-medical: Not on file  Occupational History  . Occupation: cleans houses  Tobacco Use  . Smoking status: Never Smoker  . Smokeless tobacco: Never Used  Substance and Sexual Activity  . Alcohol use: Yes  . Drug use: No  . Sexual activity: Not on file  Other Topics Concern  . Not on file  Social History Narrative  . Not on file    Past Surgical History:  Procedure Laterality Date  . CESAREAN SECTION      Family History  Problem Relation Age of Onset  . Diabetes Unknown        1st degree relative  . Heart disease Maternal Grandmother        heart enlargement  . Sickle cell anemia Paternal Aunt     No Known Allergies  Current Outpatient Medications on File Prior to Visit  Medication Sig Dispense Refill  . amLODipine (NORVASC) 5 MG tablet TAKE 1 TABLET (5 MG TOTAL) BY MOUTH DAILY. 30 tablet 0  . aspirin 81 MG tablet Take 81 mg by mouth daily.      . hydrocortisone 1 % lotion Apply 1 application topically 2 (two) times daily.    . metoprolol tartrate (LOPRESSOR) 100 MG tablet Take 1 tablet (100 mg total) by mouth 2 (two) times daily. 1 1/2 tab po bid 270  tablet 1   No current facility-administered medications on file prior to visit.     BP (!) 194/94 (BP Location: Right Arm, Patient Position: Sitting, Cuff Size: Small)   Pulse 81   Temp 98.2 F (36.8 C) (Oral)   Resp 16   Ht 5\' 6"  (1.676 m)   Wt 181 lb (82.1 kg)   LMP 07/16/2017   SpO2 100%   BMI 29.21 kg/m       Objective:   Physical Exam  General Appearance- Not in acute distress.  HEENT Eyes- Scleraeral/Conjuntiva-bilat- Not Yellow. Mouth & Throat- Normal.  Chest and Lung Exam Auscultation: Breath sounds:-Normal. Adventitious sounds:- No Adventitious  sounds.  Cardiovascular Auscultation:Rythm - Regular. Heart Sounds -Normal heart sounds.  Abdomen Inspection:-Inspection Normal.  Palpation/Perucssion: Palpation and Percussion of the abdomen reveal- Non Tender, No Rebound tenderness, No rigidity(Guarding) and No Palpable abdominal masses.  Liver:-Normal.  Spleen:- Normal.   Back- no cva tenderness..      Assessment & Plan:  Your sugars is 509 with ketones in urine and recent weight loss over past year. Also your blood pressure is moderately elevated.  I have talked with the ED physician and made her aware that you were on your way.  You will likely need some labs, IV hydration and some insulin.  Follow-up with us later this week or early next week.  Still think it is a good idea to get labs done on Thursday morning to assess your A1c.  Often times emergency department do not check A1c's.  The urine did look clear today.  I wanted to get a culture to make sure no infection present in light of recent complaints.  Our lab is already closed so could give sample on Thursday.  Based on improvement of symptoms and UA did not give antibiotics. Eriyah Fernando, Ramon DredgeEdward, PA-C

## 2017-07-30 NOTE — ED Provider Notes (Signed)
[facility] EMERGENCY DEPARTMENT Provider Note   CSN: [id number] Arrival date & time: 07/30/17  1233     History   Chief Complaint Chief Complaint  Patient presents with  . Hyperglycemia    HPI Danielle Flores is a 51 y.o. female.  HPI 51 year old female who presents with hyperglycemia.  She has a history of hypertension hyperlipidemia.  Patient did have history of diabetes and previously was on metformin, but due to well-controlled blood sugars was discontinued on this medication 3 years ago and has been doing well.  2 weeks ago patient began to have increasing fatigue, intermittent nausea, polyuria, polydipsia, and 18 pound weight loss.  Had seen her primary care doctor today who found that she was hyperglycemic with point-of-care glucose in the 500s.  She was sent to the ED for IV fluids, blood work.  Denies vomiting, fever, cough, shortness of breath, abdominal pain, chest pain, diarrhea, dysuria.   Past Medical History:  Diagnosis Date  . Hyperglycemia   . Hyperlipidemia   . Hypertension     Patient Active Problem List   Diagnosis Date Noted  . 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 GENERALIZED ANXIETY DISORDER 10/13/2010  . CARDIAC MURMUR 10/13/2010  . CHEST PAIN, HX OF 02/28/2008  . ELBOW PAIN, RIGHT 11/01/2007  . HYPERLIPIDEMIA 10/14/2007  . HYPERGLYCEMIA 10/14/2007  . SICKLE CELL TRAIT 01/24/2007  . HYPERTENSION 01/24/2007  . POSTPROCEDURAL STATUS NEC 01/24/2007    Past Surgical History:  Procedure Laterality Date  . CESAREAN SECTION      OB History    No data available       Home Medications    Prior to Admission medications   Medication Sig Start Date End Date Taking? Authorizing Provider  amLODipine (NORVASC) 5 MG tablet TAKE 1 TABLET (5 MG TOTAL) BY MOUTH DAILY. 06/26/17   Donato SchultzLowne Chase, Yvonne R, DO  aspirin 81 MG tablet Take 81 mg by mouth daily.      [provider]  hydrocortisone 1 % lotion Apply 1  application topically 2 (two) times daily.    [provider]  metFORMIN (GLUCOPHAGE) 500 MG tablet Take 1 tablet (500 mg total) by mouth 2 (two) times daily with a meal. 07/30/17   Lavera GuiseLiu, Dana Duo, MD  metoprolol tartrate (LOPRESSOR) 100 MG tablet Take 1 tablet (100 mg total) by mouth 2 (two) times daily. 1 1/2 tab po bid 06/26/17 12/10/18  Donato SchultzLowne Chase, Yvonne R, DO    Family History Family History  Problem Relation Age of Onset  . Diabetes Unknown        1st degree relative  . Heart disease Maternal Grandmother        heart enlargement  . Sickle cell anemia Paternal Aunt     Social History Social History   Tobacco Use  . Smoking status: Never Smoker  . Smokeless tobacco: Never Used  Substance Use Topics  . Alcohol use: Yes  . Drug use: No     Allergies   Patient has no known allergies.   Review of Systems Review of Systems  Constitutional: Negative for fever.  Respiratory: Negative for shortness of breath.   Cardiovascular: Negative for chest pain.  Gastrointestinal: Negative for abdominal pain.  Endocrine: Positive for polydipsia and polyuria.  Genitourinary: Positive for frequency. Negative for dysuria.  Neurological: Negative for headaches.  Hematological: Does not bruise/bleed easily.  All other systems reviewed and are negative.    Physical Exam Updated Vital Signs BP Marland Kitchen(!)  174/85   Pulse 83   Temp 98.5 F (36.9 C) (Oral)   Resp 18   LMP 07/16/2017   SpO2 100%   Physical Exam Physical Exam  Nursing note and vitals reviewed. Constitutional: Well developed, well nourished, non-toxic, and in no acute distress Head: Normocephalic and atraumatic.  Mouth/Throat: Oropharynx is clear and dry mucous membranes.  Neck: Normal range of motion. Neck supple.  Cardiovascular: Normal rate and regular rhythm.   Pulmonary/Chest: Effort normal and breath sounds normal.  Abdominal: Soft. There is no tenderness. There is no rebound and no guarding.    Musculoskeletal: Normal range of motion.  Neurological: Alert, no facial droop, fluent speech, moves all extremities symmetrically Skin: Skin is warm and dry.  Psychiatric: Cooperative   ED Treatments / Results  Labs (all labs ordered are listed, but only abnormal results are displayed) Labs Reviewed  BASIC METABOLIC PANEL - Abnormal; Notable for the following components:      Result Value   Potassium 3.4 (*)    Chloride 96 (*)    Glucose, Bld 489 (*)    Anion gap 16 (*)    All other components within normal limits  CBC - Abnormal; Notable for the following components:   WBC 11.5 (*)    Hemoglobin 8.7 (*)    HCT 27.4 (*)    MCV 63.9 (*)    MCH 20.3 (*)    RDW 18.3 (*)    Platelets 467 (*)    All other components within normal limits  URINALYSIS, ROUTINE W REFLEX MICROSCOPIC - Abnormal; Notable for the following components:   Specific Gravity, Urine <1.005 (*)    Glucose, UA >=500 (*)    Ketones, ur 15 (*)    All other components within normal limits  URINALYSIS, MICROSCOPIC (REFLEX) - Abnormal; Notable for the following components:   Bacteria, UA FEW (*)    Squamous Epithelial / LPF 0-5 (*)    All other components within normal limits  BASIC METABOLIC PANEL - Abnormal; Notable for the following components:   Potassium 3.3 (*)    Glucose, Bld 384 (*)    Calcium 8.5 (*)    All other components within normal limits  CBG MONITORING, ED - Abnormal; Notable for the following components:   Glucose-Capillary 458 (*)    All other components within normal limits  CBG MONITORING, ED - Abnormal; Notable for the following components:   Glucose-Capillary 418 (*)    All other components within normal limits  PREGNANCY, URINE    EKG  EKG Interpretation None       Radiology No results found.  Procedures Procedures (including critical care time)  Medications Ordered in ED Medications  sodium chloride 0.9 % bolus 1,000 mL (0 mLs Intravenous Stopped 07/30/17 1406)   sodium chloride 0.9 % bolus 1,000 mL (0 mLs Intravenous Stopped 07/30/17 1324)  insulin aspart (novoLOG) injection 10 Units (10 Units Subcutaneous Given 07/30/17 1350)     Initial Impression / Assessment and Plan / ED Course  I have reviewed the triage vital signs and the nursing notes.  Pertinent labs & imaging results that were available during my care of the patient were reviewed by me and considered in my medical decision making (see chart for details).     51 year old female who presents with hyperglycemia.  Has evidence of recurrent diabetes.  Is nontoxic in no acute distress with normal vital signs.  Looks dry on exam, but otherwise exam nonfocal.  Does have hyperglycemia with glucose in  the 400s.  She does have anion gap of 16, but has a normal bicarbonate.  I have low suspicion for DKA.  She is given IV fluids, subcutaneous insulin, and does have downtrending glucose.  Repeat basic after fluids shows normal anion gap, normal bicarbonate.  Suspect it may be from dehydration.  Given downtrending glucose, no DKA, and her well appearance  I do feel that she is stable for discharge home.  She is restarted on metformin.  She will follow-up with her PCP for ongoing management of her blood sugar.  Final Clinical Impressions(s) / ED Diagnoses   Final diagnoses:  Hyperglycemia    ED Discharge Orders        Ordered    metFORMIN (GLUCOPHAGE) 500 MG tablet  2 times daily with meals     07/30/17 1438       Lavera GuiseLiu, Dana Duo, MD 07/30/17 [phone]

## 2017-08-06 ENCOUNTER — Ambulatory Visit (INDEPENDENT_AMBULATORY_CARE_PROVIDER_SITE_OTHER): Payer: Self-pay | Admitting: Family Medicine

## 2017-08-06 ENCOUNTER — Encounter: Payer: Self-pay | Admitting: Family Medicine

## 2017-08-06 VITALS — BP 128/76 | HR 87 | Temp 98.1°F | Resp 16 | Ht 66.0 in | Wt 186.8 lb

## 2017-08-06 DIAGNOSIS — I10 Essential (primary) hypertension: Secondary | ICD-10-CM

## 2017-08-06 DIAGNOSIS — E1151 Type 2 diabetes mellitus with diabetic peripheral angiopathy without gangrene: Secondary | ICD-10-CM

## 2017-08-06 DIAGNOSIS — IMO0002 Reserved for concepts with insufficient information to code with codable children: Secondary | ICD-10-CM | POA: Insufficient documentation

## 2017-08-06 DIAGNOSIS — E785 Hyperlipidemia, unspecified: Secondary | ICD-10-CM

## 2017-08-06 DIAGNOSIS — E1165 Type 2 diabetes mellitus with hyperglycemia: Secondary | ICD-10-CM

## 2017-08-06 MED ORDER — METOPROLOL TARTRATE 100 MG PO TABS: 150.0000 mg | ORAL_TABLET | Freq: Two times a day (BID) | ORAL | 1 refills | Status: DC

## 2017-08-06 MED ORDER — METFORMIN HCL 500 MG PO TABS: 500.0000 mg | ORAL_TABLET | Freq: Two times a day (BID) | ORAL | 1 refills | Status: DC

## 2017-08-06 MED ORDER — AMLODIPINE BESYLATE 5 MG PO TABS: ORAL_TABLET | ORAL | 1 refills | Status: DC

## 2017-08-06 MED ORDER — BLOOD GLUCOSE MONITOR KIT: PACK | 0 refills | Status: DC

## 2017-08-06 NOTE — Assessment & Plan Note (Signed)
hgba1c to be checked in 3 months, minimize simple carbs. Increase exercise as tolerated. Continue current meds

## 2017-08-06 NOTE — Assessment & Plan Note (Signed)
Encouraged heart healthy diet, increase exercise, avoid trans fats, consider a krill oil cap daily 

## 2017-08-06 NOTE — Assessment & Plan Note (Signed)
Well controlled, no changes to meds. Encouraged heart healthy diet such as the DASH diet and exercise as tolerated.  °

## 2017-08-06 NOTE — Patient Instructions (Signed)

## 2017-08-06 NOTE — Progress Notes (Signed)
Patient ID: Danielle Flores, female    DOB: 1965/09/26  Age: 51 y.o. MRN: [id number]    Subjective:  Subjective  HPI Danielle Flores presents for dm follow up.  Pt is feeling much better since starting metformin.  She also needs a new glucometer.    Review of Systems  Constitutional: Negative for fever.  HENT: Negative for congestion.   Respiratory: Negative for shortness of breath.   Cardiovascular: Negative for chest pain, palpitations and leg swelling.  Gastrointestinal: Negative for abdominal pain, blood in stool and nausea.  Genitourinary: Negative for dysuria and frequency.  Skin: Negative for rash.  Allergic/Immunologic: Negative for environmental allergies.  Neurological: Negative for dizziness and headaches.  Psychiatric/Behavioral: The patient is not nervous/anxious.     History Past Medical History:  Diagnosis Date  . Hyperglycemia   . Hyperlipidemia   . Hypertension     She has a past surgical history that includes Cesarean section.   Her family history includes Diabetes in her unknown relative; Heart disease in her maternal grandmother; Sickle cell anemia in her paternal aunt.She reports that  has never smoked. she has never used smokeless tobacco. She reports that she drinks alcohol. She reports that she does not use drugs.  Current Outpatient Medications on File Prior to Visit  Medication Sig Dispense Refill  . aspirin 81 MG tablet Take 81 mg by mouth daily.      Marland Kitchen CINNAMON PO Take 1 tablet by mouth daily.    . hydrocortisone 1 % lotion Apply 1 application topically 2 (two) times daily.    Marland Kitchen MEGARED OMEGA-3 KRILL OIL 500 MG CAPS Take 1 tablet by mouth daily.     No current facility-administered medications on file prior to visit.      Objective:  Objective  Physical Exam  Constitutional: She is oriented to person, place, and time. She appears well-developed and well-nourished.  HENT:  Head: Normocephalic and atraumatic.  Eyes: Conjunctivae and EOM are  normal.  Neck: Normal range of motion. Neck supple. No JVD present. Carotid bruit is not present. No thyromegaly present.  Cardiovascular: Normal rate, regular rhythm and normal heart sounds.  No murmur heard. Pulmonary/Chest: Effort normal and breath sounds normal. No respiratory distress. She has no wheezes. She has no rales. She exhibits no tenderness.  Musculoskeletal: She exhibits no edema.  Neurological: She is alert and oriented to person, place, and time.  Psychiatric: She has a normal mood and affect.  Nursing note and vitals reviewed. Sensory exam of the foot is normal, tested with the monofilament. Good pulses, no lesions or ulcers, good peripheral pulses.  BP 128/76 (BP Location: Right Arm, Cuff Size: Large)   Pulse 87   Temp 98.1 F (36.7 C) (Oral)   Resp 16   Ht '5\' 6"'  (1.676 m)   Wt 186 lb 12.8 oz (84.7 kg)   LMP 07/16/2017   SpO2 98%   BMI 30.15 kg/m  Wt Readings from Last 3 Encounters:  08/06/17 186 lb 12.8 oz (84.7 kg)  07/30/17 181 lb (82.1 kg)  03/23/14 193 lb (87.5 kg)     Lab Results  Component Value Date   WBC 11.5 (H) 07/30/2017   HGB 8.7 (L) 07/30/2017   HCT 27.4 (L) 07/30/2017   PLT 467 (H) 07/30/2017   GLUCOSE 384 (H) 07/30/2017   CHOL 180 03/23/2014   TRIG 109.0 03/23/2014   HDL 38.90 (L) 03/23/2014   LDLCALC 119 (H) 03/23/2014   ALT 16 03/23/2014   AST  21 03/23/2014   NA 137 07/30/2017   K 3.3 (L) 07/30/2017   CL 102 07/30/2017   CREATININE 0.75 07/30/2017   BUN 9 07/30/2017   CO2 23 07/30/2017   TSH 1.11 04/12/2012   HGBA1C 6.4 03/23/2014   MICROALBUR 9.4 (H) 03/24/2014    No results found.   Assessment & Plan:  Plan  I have changed Hoyle Sauer T. Sattler's metoprolol tartrate. I am also having her start on blood glucose meter kit and supplies. Additionally, I am having her maintain her aspirin, hydrocortisone, MEGARED OMEGA-3 KRILL OIL, CINNAMON PO, amLODipine, and metFORMIN.  Meds ordered this encounter  Medications  . amLODipine  (NORVASC) 5 MG tablet    Sig: TAKE 1 TABLET (5 MG TOTAL) BY MOUTH DAILY.    Dispense:  90 tablet    Refill:  1    30 only. The patient needs an OV for more refills.  . metoprolol tartrate (LOPRESSOR) 100 MG tablet    Sig: Take 1.5 tablets (150 mg total) by mouth 2 (two) times daily.    Dispense:  270 tablet    Refill:  1  . DISCONTD: metFORMIN (GLUCOPHAGE) 500 MG tablet    Sig: Take 1 tablet (500 mg total) by mouth 2 (two) times daily with a meal.    Dispense:  180 tablet    Refill:  1  . metFORMIN (GLUCOPHAGE) 500 MG tablet    Sig: Take 1 tablet (500 mg total) by mouth 2 (two) times daily with a meal.    Dispense:  180 tablet    Refill:  1  . blood glucose meter kit and supplies KIT    Sig: Dispense based on patient and insurance preference. Use up to four times daily as directed. (FOR ICD-9 250.00, 250.01).    Dispense:  1 each    Refill:  0    Order Specific Question:   Number of strips    Answer:   100    Order Specific Question:   Number of lancets    Answer:   100    Problem List Items Addressed This Visit      Unprioritized   DM (diabetes mellitus) type II uncontrolled, periph vascular disorder ([place]) - Primary    hgba1c to be checked in 3 months, minimize simple carbs. Increase exercise as tolerated. Continue current meds        Relevant Medications   amLODipine (NORVASC) 5 MG tablet   metoprolol tartrate (LOPRESSOR) 100 MG tablet   metFORMIN (GLUCOPHAGE) 500 MG tablet   blood glucose meter kit and supplies KIT   Other Relevant Orders   Microalbumin / creatinine urine ratio   Lipid panel   CBC with Differential/Platelet   Comprehensive metabolic panel   Hemoglobin A1c   Microalbumin / creatinine urine ratio   POCT Urinalysis Dipstick (Automated)   Ambulatory referral to diabetic education   Essential hypertension    Well controlled, no changes to meds. Encouraged heart healthy diet such as the DASH diet and exercise as tolerated.        Relevant  Medications   amLODipine (NORVASC) 5 MG tablet   metoprolol tartrate (LOPRESSOR) 100 MG tablet   Other Relevant Orders   Lipid panel   CBC with Differential/Platelet   Comprehensive metabolic panel   Hemoglobin A1c   Microalbumin / creatinine urine ratio   POCT Urinalysis Dipstick (Automated)   Hyperlipidemia LDL goal <100    Encouraged heart healthy diet, increase exercise, avoid trans fats, consider a krill  oil cap daily      Relevant Medications   amLODipine (NORVASC) 5 MG tablet   metoprolol tartrate (LOPRESSOR) 100 MG tablet    Other Visit Diagnoses    Hyperlipidemia LDL goal <70       Relevant Medications   amLODipine (NORVASC) 5 MG tablet   metoprolol tartrate (LOPRESSOR) 100 MG tablet   Other Relevant Orders   Lipid panel   CBC with Differential/Platelet   Comprehensive metabolic panel   Hemoglobin A1c   Microalbumin / creatinine urine ratio   POCT Urinalysis Dipstick (Automated)      Follow-up: Return in about 3 months (around 11/04/2017) for hypertension, hyperlipidemia, diabetes II.  Ann Held, DO

## 2017-08-10 ENCOUNTER — Encounter: Payer: Self-pay | Admitting: Family Medicine

## 2017-08-10 ENCOUNTER — Other Ambulatory Visit (INDEPENDENT_AMBULATORY_CARE_PROVIDER_SITE_OTHER): Payer: Self-pay

## 2017-08-10 DIAGNOSIS — E1151 Type 2 diabetes mellitus with diabetic peripheral angiopathy without gangrene: Secondary | ICD-10-CM

## 2017-08-10 DIAGNOSIS — E1165 Type 2 diabetes mellitus with hyperglycemia: Secondary | ICD-10-CM

## 2017-08-10 LAB — CBC WITH DIFFERENTIAL/PLATELET
Basophils Absolute: 0.1 10*3/uL (ref 0.0–0.1)
Basophils Relative: 1.3 % (ref 0.0–3.0)
EOS PCT: 1.6 % (ref 0.0–5.0)
Eosinophils Absolute: 0.1 10*3/uL (ref 0.0–0.7)
HEMATOCRIT: 26.4 % — AB (ref 36.0–46.0)
Hemoglobin: 8.1 g/dL — ABNORMAL LOW (ref 12.0–15.0)
LYMPHS ABS: 1.7 10*3/uL (ref 0.7–4.0)
LYMPHS PCT: 30.9 % (ref 12.0–46.0)
MCHC: 30.7 g/dL (ref 30.0–36.0)
MCV: 63.6 fl — AB (ref 78.0–100.0)
MONOS PCT: 7 % (ref 3.0–12.0)
Monocytes Absolute: 0.4 10*3/uL (ref 0.1–1.0)
NEUTROS PCT: 59.2 % (ref 43.0–77.0)
Neutro Abs: 3.2 10*3/uL (ref 1.4–7.7)
Platelets: 450 10*3/uL — ABNORMAL HIGH (ref 150.0–400.0)
RBC: 4.15 Mil/uL (ref 3.87–5.11)
RDW: 21.8 % — ABNORMAL HIGH (ref 11.5–15.5)
WBC: 5.5 10*3/uL (ref 4.0–10.5)

## 2017-08-10 LAB — COMPREHENSIVE METABOLIC PANEL
ALK PHOS: 59 U/L (ref 39–117)
ALT: 13 U/L (ref 0–35)
AST: 15 U/L (ref 0–37)
Albumin: 3.7 g/dL (ref 3.5–5.2)
BILIRUBIN TOTAL: 0.3 mg/dL (ref 0.2–1.2)
BUN: 6 mg/dL (ref 6–23)
CALCIUM: 8.4 mg/dL (ref 8.4–10.5)
CO2: 27 mEq/L (ref 19–32)
Chloride: 102 mEq/L (ref 96–112)
Creatinine, Ser: 0.83 mg/dL (ref 0.40–1.20)
GFR: 92.16 mL/min (ref >60.00)
Glucose, Bld: 150 mg/dL — ABNORMAL HIGH (ref 70–99)
POTASSIUM: 3.4 meq/L — AB (ref 3.5–5.1)
Sodium: 139 mEq/L (ref 135–145)
TOTAL PROTEIN: 7.2 g/dL (ref 6.0–8.3)

## 2017-08-10 LAB — MICROALBUMIN / CREATININE URINE RATIO
CREATININE, U: 92.7 mg/dL
MICROALB UR: 3.2 mg/dL — AB (ref 0.0–1.9)
MICROALB/CREAT RATIO: 3.4 mg/g (ref 0.0–30.0)

## 2017-08-10 LAB — LIPID PANEL
Cholesterol: 167 mg/dL (ref 0–200)
HDL: 31.8 mg/dL — AB (ref >39.00)
LDL Cholesterol: 112 mg/dL — ABNORMAL HIGH (ref 0–99)
NONHDL: 135.66
Total CHOL/HDL Ratio: 5
Triglycerides: 117 mg/dL (ref 0.0–149.0)
VLDL: 23.4 mg/dL (ref 0.0–40.0)

## 2017-08-10 LAB — HEMOGLOBIN A1C

## 2017-12-28 ENCOUNTER — Encounter: Payer: Self-pay | Admitting: Family Medicine

## 2018-03-15 ENCOUNTER — Other Ambulatory Visit: Payer: Self-pay | Admitting: Family Medicine

## 2018-03-15 DIAGNOSIS — I10 Essential (primary) hypertension: Secondary | ICD-10-CM

## 2018-03-15 DIAGNOSIS — IMO0002 Reserved for concepts with insufficient information to code with codable children: Secondary | ICD-10-CM

## 2018-03-15 DIAGNOSIS — E1165 Type 2 diabetes mellitus with hyperglycemia: Principal | ICD-10-CM

## 2018-03-15 DIAGNOSIS — E1151 Type 2 diabetes mellitus with diabetic peripheral angiopathy without gangrene: Secondary | ICD-10-CM

## 2018-06-07 ENCOUNTER — Other Ambulatory Visit: Payer: Self-pay | Admitting: Family Medicine

## 2018-06-07 DIAGNOSIS — E1151 Type 2 diabetes mellitus with diabetic peripheral angiopathy without gangrene: Secondary | ICD-10-CM

## 2018-06-07 DIAGNOSIS — E1165 Type 2 diabetes mellitus with hyperglycemia: Principal | ICD-10-CM

## 2018-06-07 DIAGNOSIS — IMO0002 Reserved for concepts with insufficient information to code with codable children: Secondary | ICD-10-CM

## 2018-09-10 ENCOUNTER — Encounter: Payer: Self-pay | Admitting: Family Medicine

## 2018-09-13 ENCOUNTER — Inpatient Hospital Stay (HOSPITAL_COMMUNITY)
Admission: EM | Admit: 2018-09-13 | Discharge: 2018-09-17 | DRG: 246 | Disposition: A | Discharge status: Home / Self Care | Payer: Self-pay | Attending: Family Medicine | Admitting: Family Medicine

## 2018-09-13 ENCOUNTER — Emergency Department (HOSPITAL_COMMUNITY): Payer: Self-pay

## 2018-09-13 ENCOUNTER — Other Ambulatory Visit: Payer: Self-pay

## 2018-09-13 ENCOUNTER — Inpatient Hospital Stay (HOSPITAL_COMMUNITY): Payer: Self-pay

## 2018-09-13 ENCOUNTER — Encounter (HOSPITAL_COMMUNITY): Payer: Self-pay

## 2018-09-13 DIAGNOSIS — R7989 Other specified abnormal findings of blood chemistry: Secondary | ICD-10-CM

## 2018-09-13 DIAGNOSIS — R778 Other specified abnormalities of plasma proteins: Secondary | ICD-10-CM

## 2018-09-13 DIAGNOSIS — Y92239 Unspecified place in hospital as the place of occurrence of the external cause: Secondary | ICD-10-CM | POA: Diagnosis not present

## 2018-09-13 DIAGNOSIS — Z955 Presence of coronary angioplasty implant and graft: Secondary | ICD-10-CM

## 2018-09-13 DIAGNOSIS — I2510 Atherosclerotic heart disease of native coronary artery without angina pectoris: Secondary | ICD-10-CM | POA: Diagnosis present

## 2018-09-13 DIAGNOSIS — M62838 Other muscle spasm: Secondary | ICD-10-CM | POA: Diagnosis present

## 2018-09-13 DIAGNOSIS — R4781 Slurred speech: Secondary | ICD-10-CM

## 2018-09-13 DIAGNOSIS — E1151 Type 2 diabetes mellitus with diabetic peripheral angiopathy without gangrene: Secondary | ICD-10-CM | POA: Diagnosis present

## 2018-09-13 DIAGNOSIS — Y848 Other medical procedures as the cause of abnormal reaction of the patient, or of later complication, without mention of misadventure at the time of the procedure: Secondary | ICD-10-CM | POA: Diagnosis not present

## 2018-09-13 DIAGNOSIS — E1165 Type 2 diabetes mellitus with hyperglycemia: Secondary | ICD-10-CM

## 2018-09-13 DIAGNOSIS — I119 Hypertensive heart disease without heart failure: Secondary | ICD-10-CM | POA: Diagnosis present

## 2018-09-13 DIAGNOSIS — N179 Acute kidney failure, unspecified: Secondary | ICD-10-CM

## 2018-09-13 DIAGNOSIS — I10 Essential (primary) hypertension: Secondary | ICD-10-CM

## 2018-09-13 DIAGNOSIS — IMO0002 Reserved for concepts with insufficient information to code with codable children: Secondary | ICD-10-CM

## 2018-09-13 DIAGNOSIS — Z7984 Long term (current) use of oral hypoglycemic drugs: Secondary | ICD-10-CM

## 2018-09-13 DIAGNOSIS — I517 Cardiomegaly: Secondary | ICD-10-CM

## 2018-09-13 DIAGNOSIS — I519 Heart disease, unspecified: Secondary | ICD-10-CM

## 2018-09-13 DIAGNOSIS — Z8249 Family history of ischemic heart disease and other diseases of the circulatory system: Secondary | ICD-10-CM

## 2018-09-13 DIAGNOSIS — Z79899 Other long term (current) drug therapy: Secondary | ICD-10-CM

## 2018-09-13 DIAGNOSIS — E785 Hyperlipidemia, unspecified: Secondary | ICD-10-CM | POA: Diagnosis present

## 2018-09-13 DIAGNOSIS — I214 Non-ST elevation (NSTEMI) myocardial infarction: Secondary | ICD-10-CM

## 2018-09-13 DIAGNOSIS — S40021A Contusion of right upper arm, initial encounter: Secondary | ICD-10-CM | POA: Diagnosis not present

## 2018-09-13 DIAGNOSIS — D509 Iron deficiency anemia, unspecified: Secondary | ICD-10-CM | POA: Diagnosis present

## 2018-09-13 DIAGNOSIS — Z531 Procedure and treatment not carried out because of patient's decision for reasons of belief and group pressure: Secondary | ICD-10-CM | POA: Diagnosis present

## 2018-09-13 DIAGNOSIS — E860 Dehydration: Secondary | ICD-10-CM | POA: Diagnosis present

## 2018-09-13 DIAGNOSIS — E1110 Type 2 diabetes mellitus with ketoacidosis without coma: Secondary | ICD-10-CM | POA: Diagnosis present

## 2018-09-13 DIAGNOSIS — R4701 Aphasia: Secondary | ICD-10-CM | POA: Diagnosis present

## 2018-09-13 LAB — CBG MONITORING, ED
Glucose-Capillary: >600 mg/dL (ref 70–99)
Glucose-Capillary: >600 mg/dL (ref 70–99)
Glucose-Capillary: >600 mg/dL (ref 70–99)
Glucose-Capillary: 410 mg/dL — ABNORMAL HIGH (ref 70–99)

## 2018-09-13 LAB — MAGNESIUM: Magnesium: 3 mg/dL — ABNORMAL HIGH (ref 1.7–2.4)

## 2018-09-13 LAB — POCT I-STAT EG7
Acid-base deficit: 16 mmol/L — ABNORMAL HIGH (ref 0.0–2.0)
Bicarbonate: 10.7 mmol/L — ABNORMAL LOW (ref 20.0–28.0)
Calcium, Ion: 1.27 mmol/L (ref 1.15–1.40)
HCT: 46 % (ref 36.0–46.0)
Hemoglobin: 15.6 g/dL — ABNORMAL HIGH (ref 12.0–15.0)
O2 Saturation: 59 %
PO2 VEN: 37 mmHg (ref 32.0–45.0)
Potassium: 4.6 mmol/L (ref 3.5–5.1)
Sodium: 144 mmol/L (ref 135–145)
TCO2: 12 mmol/L — ABNORMAL LOW (ref 22–32)
pCO2, Ven: 27.7 mmHg — ABNORMAL LOW (ref 44.0–60.0)
pH, Ven: 7.195 — CL (ref 7.250–7.430)

## 2018-09-13 LAB — URINALYSIS, ROUTINE W REFLEX MICROSCOPIC
Bilirubin Urine: NEGATIVE
Glucose, UA: >=500 mg/dL — AB
Ketones, ur: 80 mg/dL — AB
Leukocytes, UA: NEGATIVE
Nitrite: NEGATIVE
Protein, ur: NEGATIVE mg/dL
Specific Gravity, Urine: 1.02 (ref 1.005–1.030)
pH: 6 (ref 5.0–8.0)

## 2018-09-13 LAB — BASIC METABOLIC PANEL
Anion gap: 21 — ABNORMAL HIGH (ref 5–15)
BUN: 45 mg/dL — ABNORMAL HIGH (ref 6–20)
CO2: 12 mmol/L — ABNORMAL LOW (ref 22–32)
Calcium: 9.6 mg/dL (ref 8.9–10.3)
Chloride: 114 mmol/L — ABNORMAL HIGH (ref 98–111)
Creatinine, Ser: 1.92 mg/dL — ABNORMAL HIGH (ref 0.44–1.00)
GFR calc Af Amer: 33 mL/min — ABNORMAL LOW (ref >60)
GFR calc non Af Amer: 29 mL/min — ABNORMAL LOW (ref >60)
Glucose, Bld: 695 mg/dL (ref 70–99)
Potassium: 4.3 mmol/L (ref 3.5–5.1)
SODIUM: 147 mmol/L — AB (ref 135–145)

## 2018-09-13 LAB — TROPONIN I: TROPONIN I: 4.31 ng/mL — AB (ref <0.03)

## 2018-09-13 LAB — COMPREHENSIVE METABOLIC PANEL
ALT: 19 U/L (ref 0–44)
AST: 22 U/L (ref 15–41)
Albumin: 4.3 g/dL (ref 3.5–5.0)
Alkaline Phosphatase: 88 U/L (ref 38–126)
Anion gap: 28 — ABNORMAL HIGH (ref 5–15)
BUN: 47 mg/dL — ABNORMAL HIGH (ref 6–20)
CALCIUM: 10.3 mg/dL (ref 8.9–10.3)
CO2: 10 mmol/L — ABNORMAL LOW (ref 22–32)
Chloride: 104 mmol/L (ref 98–111)
Creatinine, Ser: 2.15 mg/dL — ABNORMAL HIGH (ref 0.44–1.00)
GFR calc non Af Amer: 25 mL/min — ABNORMAL LOW (ref >60)
GFR, EST AFRICAN AMERICAN: 29 mL/min — AB (ref >60)
Glucose, Bld: 928 mg/dL (ref 70–99)
Potassium: 5.1 mmol/L (ref 3.5–5.1)
Sodium: 142 mmol/L (ref 135–145)
Total Bilirubin: 1.9 mg/dL — ABNORMAL HIGH (ref 0.3–1.2)
Total Protein: 9.2 g/dL — ABNORMAL HIGH (ref 6.5–8.1)

## 2018-09-13 LAB — GLUCOSE, CAPILLARY
GLUCOSE-CAPILLARY: 269 mg/dL — AB (ref 70–99)
GLUCOSE-CAPILLARY: 170 mg/dL — AB (ref 70–99)
Glucose-Capillary: 352 mg/dL — ABNORMAL HIGH (ref 70–99)
Glucose-Capillary: 244 mg/dL — ABNORMAL HIGH (ref 70–99)
Glucose-Capillary: 128 mg/dL — ABNORMAL HIGH (ref 70–99)
Glucose-Capillary: 84 mg/dL (ref 70–99)
Glucose-Capillary: 150 mg/dL — ABNORMAL HIGH (ref 70–99)
Glucose-Capillary: 367 mg/dL — ABNORMAL HIGH (ref 70–99)
Glucose-Capillary: 380 mg/dL — ABNORMAL HIGH (ref 70–99)
Glucose-Capillary: 214 mg/dL — ABNORMAL HIGH (ref 70–99)
Glucose-Capillary: 138 mg/dL — ABNORMAL HIGH (ref 70–99)
Glucose-Capillary: 292 mg/dL — ABNORMAL HIGH (ref 70–99)
Glucose-Capillary: 223 mg/dL — ABNORMAL HIGH (ref 70–99)
Glucose-Capillary: 178 mg/dL — ABNORMAL HIGH (ref 70–99)
Glucose-Capillary: 145 mg/dL — ABNORMAL HIGH (ref 70–99)

## 2018-09-13 LAB — BASIC METABOLIC PANEL WITH GFR
Anion gap: 12 (ref 5–15)
BUN: 32 mg/dL — ABNORMAL HIGH (ref 6–20)
CO2: 18 mmol/L — ABNORMAL LOW (ref 22–32)
Calcium: 9.5 mg/dL (ref 8.9–10.3)
Chloride: 125 mmol/L — ABNORMAL HIGH (ref 98–111)
Creatinine, Ser: 1.31 mg/dL — ABNORMAL HIGH (ref 0.44–1.00)
GFR calc Af Amer: 53 mL/min — ABNORMAL LOW
GFR calc non Af Amer: 46 mL/min — ABNORMAL LOW
Glucose, Bld: 86 mg/dL (ref 70–99)
Potassium: 3.7 mmol/L (ref 3.5–5.1)
Sodium: 155 mmol/L — ABNORMAL HIGH (ref 135–145)

## 2018-09-13 LAB — ECHOCARDIOGRAM COMPLETE
Height: 66 in
Weight: 3024 oz

## 2018-09-13 LAB — CBC
HCT: 49.1 % — ABNORMAL HIGH (ref 36.0–46.0)
HCT: 47.9 % — ABNORMAL HIGH (ref 36.0–46.0)
HEMOGLOBIN: 15 g/dL (ref 12.0–15.0)
Hemoglobin: 15.5 g/dL — ABNORMAL HIGH (ref 12.0–15.0)
MCH: 25 pg — ABNORMAL LOW (ref 26.0–34.0)
MCH: 24.4 pg — ABNORMAL LOW (ref 26.0–34.0)
MCHC: 31.6 g/dL (ref 30.0–36.0)
MCHC: 31.3 g/dL (ref 30.0–36.0)
MCV: 79.3 fL — ABNORMAL LOW (ref 80.0–100.0)
MCV: 78 fL — ABNORMAL LOW (ref 80.0–100.0)
Platelets: 300 10*3/uL (ref 150–400)
Platelets: 270 10*3/uL (ref 150–400)
RBC: 6.19 MIL/uL — ABNORMAL HIGH (ref 3.87–5.11)
RBC: 6.14 MIL/uL — ABNORMAL HIGH (ref 3.87–5.11)
RDW: 16.6 % — ABNORMAL HIGH (ref 11.5–15.5)
RDW: 15.7 % — ABNORMAL HIGH (ref 11.5–15.5)
WBC: 7.7 10*3/uL (ref 4.0–10.5)
WBC: 7.9 10*3/uL (ref 4.0–10.5)
nRBC: 0 % (ref 0.0–0.2)
nRBC: 0 % (ref 0.0–0.2)

## 2018-09-13 LAB — LACTIC ACID, PLASMA: Lactic Acid, Venous: 3.1 mmol/L (ref 0.5–1.9)

## 2018-09-13 LAB — HEMOGLOBIN A1C
Hgb A1c MFr Bld: 15 % — ABNORMAL HIGH (ref 4.8–5.6)
Mean Plasma Glucose: 383.8 mg/dL

## 2018-09-13 LAB — I-STAT TROPONIN, ED: TROPONIN I, POC: 2.8 ng/mL — AB (ref 0.00–0.08)

## 2018-09-13 LAB — HEPARIN LEVEL (UNFRACTIONATED)
HEPARIN UNFRACTIONATED: 0.49 [IU]/mL (ref 0.30–0.70)
Heparin Unfractionated: 0.34 [IU]/mL (ref 0.30–0.70)

## 2018-09-13 LAB — HIV ANTIBODY (ROUTINE TESTING W REFLEX): HIV Screen 4th Generation wRfx: NONREACTIVE

## 2018-09-13 LAB — MRSA PCR SCREENING: MRSA by PCR: NEGATIVE

## 2018-09-13 LAB — CK: Total CK: 131 U/L (ref 38–234)

## 2018-09-13 MED ORDER — ASPIRIN EC 81 MG PO TBEC
81.0000 mg | DELAYED_RELEASE_TABLET | Freq: Every day | ORAL | Status: DC
  Administered 2018-09-13 – 2018-09-17 (×4): 81 mg via ORAL
  Filled 2018-09-13 (×4): qty 1

## 2018-09-13 MED ORDER — ASPIRIN EC 81 MG PO TBEC
81.0000 mg | DELAYED_RELEASE_TABLET | Freq: Every day | ORAL | Status: DC
  Administered 2018-09-13: 81 mg via ORAL
  Filled 2018-09-13: qty 1

## 2018-09-13 MED ORDER — METOPROLOL TARTRATE 50 MG PO TABS
150.0000 mg | ORAL_TABLET | Freq: Two times a day (BID) | ORAL | Status: DC
  Administered 2018-09-13 – 2018-09-17 (×9): 150 mg via ORAL
  Filled 2018-09-13 (×9): qty 3

## 2018-09-13 MED ORDER — HEPARIN BOLUS VIA INFUSION
4000.0000 [IU] | Freq: Once | INTRAVENOUS | Status: DC
  Filled 2018-09-13: qty 4000

## 2018-09-13 MED ORDER — ONDANSETRON HCL 4 MG/2ML IJ SOLN: 4.0000 mg | Freq: Four times a day (QID) | INTRAMUSCULAR | Status: DC | PRN

## 2018-09-13 MED ORDER — INSULIN STARTER KIT- PEN NEEDLES (ENGLISH)
1.0000 | Freq: Once | Status: AC
  Administered 2018-09-13: 1
  Filled 2018-09-13: qty 1

## 2018-09-13 MED ORDER — DEXTROSE-NACL 5-0.45 % IV SOLN
INTRAVENOUS | Status: DC
  Administered 2018-09-13 (×2): via INTRAVENOUS

## 2018-09-13 MED ORDER — INSULIN REGULAR(HUMAN) IN NACL 100-0.9 UT/100ML-% IV SOLN
INTRAVENOUS | Status: DC
  Administered 2018-09-13: 5.4 [IU]/h via INTRAVENOUS
  Filled 2018-09-13: qty 100

## 2018-09-13 MED ORDER — INSULIN REGULAR(HUMAN) IN NACL 100-0.9 UT/100ML-% IV SOLN
INTRAVENOUS | Status: DC
  Administered 2018-09-13: 7.1 [IU]/h via INTRAVENOUS
  Filled 2018-09-13 (×3): qty 100

## 2018-09-13 MED ORDER — HYDRALAZINE HCL 20 MG/ML IJ SOLN: 10.0000 mg | Freq: Four times a day (QID) | INTRAMUSCULAR | Status: DC | PRN

## 2018-09-13 MED ORDER — ENOXAPARIN SODIUM 40 MG/0.4ML ~~LOC~~ SOLN: 40.0000 mg | SUBCUTANEOUS | Status: DC

## 2018-09-13 MED ORDER — SODIUM CHLORIDE 0.9 % IV SOLN
INTRAVENOUS | Status: DC
  Administered 2018-09-13: 05:00:00 via INTRAVENOUS

## 2018-09-13 MED ORDER — SODIUM CHLORIDE 0.9 % IV BOLUS
1000.0000 mL | Freq: Once | INTRAVENOUS | Status: AC
  Administered 2018-09-13: 1000 mL via INTRAVENOUS

## 2018-09-13 MED ORDER — POTASSIUM CHLORIDE 10 MEQ/100ML IV SOLN
10.0000 meq | INTRAVENOUS | Status: AC
  Administered 2018-09-13: 10 meq via INTRAVENOUS
  Filled 2018-09-13: qty 100

## 2018-09-13 MED ORDER — HEPARIN (PORCINE) 25000 UT/250ML-% IV SOLN
800.0000 [IU]/h | INTRAVENOUS | Status: DC
  Administered 2018-09-13 – 2018-09-14 (×2): 800 [IU]/h via INTRAVENOUS
  Filled 2018-09-13 (×3): qty 250

## 2018-09-13 MED ORDER — ATORVASTATIN CALCIUM 80 MG PO TABS
80.0000 mg | ORAL_TABLET | Freq: Every day | ORAL | Status: DC
  Administered 2018-09-13 – 2018-09-17 (×5): 80 mg via ORAL
  Filled 2018-09-13 (×6): qty 1

## 2018-09-13 MED ORDER — LACTATED RINGERS IV SOLN
INTRAVENOUS | Status: DC
  Administered 2018-09-13: 02:00:00 via INTRAVENOUS

## 2018-09-13 NOTE — ED Notes (Signed)
Pt aware that urine sample is needed. Unable to provide at this time  

## 2018-09-13 NOTE — ED Notes (Signed)
Notified EDP,Bero,MD, pt. I-stat troponin 2.80 and RN,Sarah made aware.

## 2018-09-13 NOTE — H&P (Signed)
History and Physical    Danielle Flores LZJ:[id number] DOB: Feb 13, 1966 DOA: 09/13/2018  PCP: Ann Held, DO  Patient coming from: Home.  Chief Complaint: Muscle spasms and weakness.  HPI: Danielle Flores is a 53 y.o. female with history of hypertension and borderline diabetes mellitus takes only metoprolol at this time for blood pressure was brought to the ER after patient was getting increasingly weak with muscle spasm and slurred speech.  The symptoms have been ongoing for last 2 days.  Denies any fall headache visual symptoms or difficulty swallowing.  Muscle spasm mostly in the upper extremities.  Patient also has been feeling weak.  Denies chest pain or shortness of breath.  ED Course: In the ER patient's blood sugar was 928 with bicarb of 10 anion gap of 28 urine showing ketones.  EKG shows normal sinus rhythm.  Point-of-care troponin was elevated at 2.  Patient was started on fluid bolus for DKA and on IV insulin infusion.  ER physician discussed with on-call cardiologist for elevated troponin.  He will be seeing patient in consult.  Patient denies any chest pain.  Review of Systems: As per HPI, rest all negative.   Past Medical History:  Diagnosis Date  . Hyperglycemia   . Hyperlipidemia   . Hypertension     Past Surgical History:  Procedure Laterality Date  . CESAREAN SECTION       reports that she has never smoked. She has never used smokeless tobacco. She reports current alcohol use. She reports that she does not use drugs.  No Known Allergies  Family History  Problem Relation Age of Onset  . Diabetes Other        1st degree relative  . Heart disease Maternal Grandmother        heart enlargement  . Sickle cell anemia Paternal Aunt     Prior to Admission medications   Medication Sig Start Date End Date Taking? Authorizing Provider  metoprolol tartrate (LOPRESSOR) 100 MG tablet Take 1.5 tablets (150 mg total) by mouth 2 (two) times daily. Needs ov  before any more refills 03/15/18  Yes Lowne Chase, Yvonne R, DO  amLODipine (NORVASC) 5 MG tablet TAKE 1 TABLET (5 MG TOTAL) BY MOUTH DAILY. Patient not taking: Reported on 09/13/2018 08/06/17   Roma Schanz R, DO  blood glucose meter kit and supplies KIT Dispense based on patient and insurance preference. Use up to four times daily as directed. (FOR ICD-9 250.00, 250.01). 08/06/17   Carollee Herter, Alferd Apa, DO  metFORMIN (GLUCOPHAGE) 500 MG tablet TAKE 1 BY MOUTH 2 (TWO) TIMES DAILY WITH A MEAL. NEEDS OV BEFORE ANY MORE REFILLS Patient not taking: Reported on 09/13/2018 06/07/18   Ann Held, DO    Physical Exam: Vitals:   09/13/18 0100 09/13/18 0212 09/13/18 0230 09/13/18 0330  BP: (!) 159/89 (!) 154/88 (!) 167/90 (!) 158/93  Pulse: 72 77 73 73  Resp: (!) _0 Temp:      TempSrc:      SpO2: 100% 98% 99% 100%  Weight:      Height:          Constitutional: Moderately built and nourished. Vitals:   09/13/18 0100 09/13/18 0212 09/13/18 0230 09/13/18 0330  BP: (!) 159/89 (!) 154/88 (!) 167/90 (!) 158/93  Pulse: 72 77 73 73  Resp: (!) _1 Temp:      TempSrc:      SpO2: 100%  98% 99% 100%  Weight:      Height:       Eyes: Anicteric no pallor. ENMT: No discharge from the ears eyes nose or mouth. Neck: Mass felt.  No neck rigidity. Respiratory: No rhonchi or crepitations. Cardiovascular: S1-S2 heard. Abdomen: Soft nontender bowel sounds present. Musculoskeletal: No edema.  No joint effusion. Skin: No rash. Neurologic: Alert awake oriented to time place and person.  Moves all extremities. Psychiatric: Appears normal.  Normal affect.   Labs on Admission: I have personally reviewed following labs and imaging studies  CBC: Recent Labs  Lab 09/13/18 0039 09/13/18 0123  WBC 7.7  --   HGB 15.5* 15.6*  HCT 49.1* 46.0  MCV 79.3*  --   PLT 300  --    Basic Metabolic Panel: Recent Labs  Lab 09/13/18 0039 09/13/18 0123  NA 142 144  K 5.1 4.6    CL 104  --   CO2 10*  --   GLUCOSE 928*  --   BUN 47*  --   CREATININE 2.15*  --   CALCIUM 10.3  --    GFR: Estimated Creatinine Clearance: 32.6 mL/min (A) (by C-G formula based on SCr of 2.15 mg/dL (H)). Liver Function Tests: Recent Labs  Lab 09/13/18 0039  AST 22  ALT 19  ALKPHOS 88  BILITOT 1.9*  PROT 9.2*  ALBUMIN 4.3   No results for input(s): LIPASE, AMYLASE in the last 168 hours. No results for input(s): AMMONIA in the last 168 hours. Coagulation Profile: No results for input(s): INR, PROTIME in the last 168 hours. Cardiac Enzymes: No results for input(s): CKTOTAL, CKMB, CKMBINDEX, TROPONINI in the last 168 hours. BNP (last 3 results) No results for input(s): PROBNP in the last 8760 hours. HbA1C: No results for input(s): HGBA1C in the last 72 hours. CBG: Recent Labs  Lab 09/13/18 0154 09/13/18 0315  GLUCAP >600* >600*   Lipid Profile: No results for input(s): CHOL, HDL, LDLCALC, TRIG, CHOLHDL, LDLDIRECT in the last 72 hours. Thyroid Function Tests: No results for input(s): TSH, T4TOTAL, FREET4, T3FREE, THYROIDAB in the last 72 hours. Anemia Panel: No results for input(s): VITAMINB12, FOLATE, FERRITIN, TIBC, IRON, RETICCTPCT in the last 72 hours. Urine analysis:    Component Value Date/Time   COLORURINE YELLOW 07/30/2017 1240   APPEARANCEUR CLEAR 07/30/2017 1240   LABSPEC <1.005 (L) 07/30/2017 1240   PHURINE 6.0 07/30/2017 1240   GLUCOSEU >=500 (A) 07/30/2017 1240   HGBUR NEGATIVE 07/30/2017 Interior 07/30/2017 1240   BILIRUBINUR neg 07/30/2017 1103   KETONESUR 15 (A) 07/30/2017 1240   PROTEINUR NEGATIVE 07/30/2017 1240   UROBILINOGEN 0.2 07/30/2017 1103   NITRITE NEGATIVE 07/30/2017 1240   LEUKOCYTESUR NEGATIVE 07/30/2017 1240   Sepsis Labs: _0 (procalcitonin:4,lacticidven:4) )No results found for this or any previous visit (from the past 240 hour(s)).   Radiological Exams on Admission: Ct Head Wo Contrast  Result  Date: 09/13/2018 CLINICAL DATA:  Slurred speech EXAM: CT HEAD WITHOUT CONTRAST TECHNIQUE: Contiguous axial images were obtained from the base of the skull through the vertex without intravenous contrast. COMPARISON:  None. FINDINGS: Brain: No evidence of acute infarction, hemorrhage, extra-axial collection, ventriculomegaly, or mass effect. Old right basal ganglia lacunar infarct. Generalized cerebral atrophy. Periventricular white matter low attenuation likely secondary to microangiopathy. Vascular: No hyperdense vessel. No significant intracranial atherosclerotic disease. Skull: Negative for fracture or focal lesion. Sinuses/Orbits: Visualized portions of the orbits are unremarkable. Visualized portions of the paranasal sinuses and mastoid air cells are unremarkable.  Other: None. IMPRESSION: No acute intracranial pathology. Electronically Signed   By: Kathreen Devoid   On: 09/13/2018 01:36   Dg Chest Port 1 View  Result Date: 09/13/2018 CLINICAL DATA:  Slurred speech, hand cramping for 48 hours EXAM: PORTABLE CHEST 1 VIEW COMPARISON:  None. FINDINGS: The heart size and mediastinal contours are within normal limits. Both lungs are clear. The visualized skeletal structures are unremarkable. IMPRESSION: No active disease. Electronically Signed   By: Kathreen Devoid   On: 09/13/2018 01:34    EKG: Independently reviewed.  Normal sinus rhythm with early repolarization changes.  Assessment/Plan Principal Problem:   DKA, type 2 ([place]) Active Problems:   Essential hypertension   NSTEMI (non-ST elevated myocardial infarction) ([place])   DKA (diabetic ketoacidoses) ([place])    1. Diabetic ketoacidosis and type 2 diabetes -patient has been placed on fluid bolus IV fluid infusion and IV insulin.  Closely follow metabolic panel and once anion gap gets corrected change to long-acting insulin.  Check hemoglobin A1c. 2. Non-ST elevation MI -denies any chest pain.  EKG shows early repolarization changes.  Cardiology has been  consulted.  Patient is on aspirin metoprolol statins.  Check 2D echo trend cardiac markers.  Patient has been placed on heparin infusion. 3. Acute renal failure likely from dehydration and DKA -continue hydration and closely follow metabolic panel.  I think patient's creatinine will improve with hydration. 4. Hypertension on metoprolol. 5. Muscle spasm and slurred speech could be from metabolic reason due to uncontrolled diabetes.  CT head is unremarkable.  Will check MRI brain.   DVT prophylaxis: Heparin infusion. Code Status: Full code. Family Communication: His husband. Disposition Plan: Home. Consults called: Cardiology. Admission status: Inpatient.   Rise Patience MD [facility] Pager [phone].  If 7PM-7AM, please contact night-coverage [email or web address] Password [facility]  09/13/2018, 3:46 AM

## 2018-09-13 NOTE — ED Notes (Signed)
[facility] at bedside 

## 2018-09-13 NOTE — ED Notes (Signed)
EKG Given to Dr. Pilar Plate for review

## 2018-09-13 NOTE — ED Provider Notes (Signed)
[facility] Emergency Department Provider Note MRN:  [id number]  Arrival date & time: 09/13/18     Chief Complaint   Slurred speech, hand pain History of Present Illness   Danielle Flores is a 53 y.o. year-old female with a history of hypertension presenting to the ED with chief complaint of slurred speech, hand pain.  Patient explains that she feels very dehydrated, generally weak.  Denies nausea or vomiting or diarrhea, normal diet.  At 6 PM today husband came home from work and noticed slurred speech, which has been mild for several days but seemed worse today.  Patient has been having twitching and cramps to bilateral hands for several days, becoming much worse this evening.  Patient denies headache or vision change, no chest pain or shortness of breath, no abdominal pain, no numbness weakness to the arms or legs.  Review of Systems  A complete 10 system review of systems was obtained and all systems are negative except as noted in the HPI and PMH.   Patient's Health History    Past Medical History:  Diagnosis Date  . Hyperglycemia   . Hyperlipidemia   . Hypertension     Past Surgical History:  Procedure Laterality Date  . CESAREAN SECTION      Family History  Problem Relation Age of Onset  . Diabetes Unknown        1st degree relative  . Heart disease Maternal Grandmother        heart enlargement  . Sickle cell anemia Paternal Aunt     Social History   Socioeconomic History  . Marital status: Married    Spouse name: Not on file  . Number of children: Not on file  . Years of education: Not on file  . Highest education level: Not on file  Occupational History  . Occupation: cleans houses  Social Needs  . Financial resource strain: Not on file  . Food insecurity:    Worry: Not on file    Inability: Not on file  . Transportation needs:    Medical: Not on file    Non-medical: Not on file  Tobacco Use  . Smoking status: Never Smoker  .  Smokeless tobacco: Never Used  Substance and Sexual Activity  . Alcohol use: Yes  . Drug use: No  . Sexual activity: Not on file  Lifestyle  . Physical activity:    Days per week: Not on file    Minutes per session: Not on file  . Stress: Not on file  Relationships  . Social connections:    Talks on phone: Not on file    Gets together: Not on file    Attends religious service: Not on file    Active member of club or organization: Not on file    Attends meetings of clubs or organizations: Not on file    Relationship status: Not on file  . Intimate partner violence:    Fear of current or ex partner: Not on file    Emotionally abused: Not on file    Physically abused: Not on file    Forced sexual activity: Not on file  Other Topics Concern  . Not on file  Social History Narrative  . Not on file     Physical Exam  Vital Signs and Nursing Notes reviewed Vitals:   09/13/18 0230 09/13/18 0330  BP: (!) 167/90 (!) 158/93  Pulse: 73 73  Resp: 20 15  Temp:    SpO2: 99%  100%    CONSTITUTIONAL: Well-appearing, NAD NEURO:  Alert and oriented x 3, normal and symmetric strength and sensation, bilateral forearm and hand spontaneous cramping and fasciculations EYES:  eyes equal and reactive ENT/NECK:  no LAD, no JVD CARDIO: Regular rate, well-perfused, normal S1 and S2 PULM:  CTAB no wheezing or rhonchi GI/GU:  normal bowel sounds, non-distended, non-tender MSK/SPINE:  No gross deformities, no edema SKIN:  no rash, atraumatic PSYCH:  Appropriate speech and behavior  Diagnostic and Interventional Summary    EKG Interpretation  Date/Time:  Friday September 13 2018 00:35:46 EST Ventricular Rate:  76 PR Interval:  148 QRS Duration: 90 QT Interval:  394 QTC Calculation: 443 R Axis:   71 Text Interpretation:  Normal sinus rhythm Left ventricular hypertrophy with repolarization abnormality Abnormal ECG Confirmed by Kennis Carina [phone]) on 09/13/2018 1:06:48 AM      Labs Reviewed   CBC - Abnormal; Notable for the following components:      Result Value   RBC 6.19 (*)    Hemoglobin 15.5 (*)    HCT 49.1 (*)    MCV 79.3 (*)    MCH 25.0 (*)    RDW 16.6 (*)    All other components within normal limits  COMPREHENSIVE METABOLIC PANEL - Abnormal; Notable for the following components:   CO2 10 (*)    Glucose, Bld 928 (*)    BUN 47 (*)    Creatinine, Ser 2.15 (*)    Total Protein 9.2 (*)    Total Bilirubin 1.9 (*)    GFR calc non Af Amer 25 (*)    GFR calc Af Amer 29 (*)    Anion gap 28 (*)    All other components within normal limits  LACTIC ACID, PLASMA - Abnormal; Notable for the following components:   Lactic Acid, Venous 3.1 (*)    All other components within normal limits  I-STAT TROPONIN, ED - Abnormal; Notable for the following components:   Troponin i, poc 2.80 (*)    All other components within normal limits  POCT I-STAT EG7 - Abnormal; Notable for the following components:   pH, Ven 7.195 (*)    pCO2, Ven 27.7 (*)    Bicarbonate 10.7 (*)    TCO2 12 (*)    Acid-base deficit 16.0 (*)    Hemoglobin 15.6 (*)    All other components within normal limits  CBG MONITORING, ED - Abnormal; Notable for the following components:   Glucose-Capillary >600 (*)    All other components within normal limits  CBG MONITORING, ED - Abnormal; Notable for the following components:   Glucose-Capillary >600 (*)    All other components within normal limits  URINALYSIS, ROUTINE W REFLEX MICROSCOPIC  CK  I-STAT TROPONIN, ED    CT Head Wo Contrast  Final Result    DG Chest Port 1 View  Final Result      Medications  insulin regular, human (MYXREDLIN) 100 units/ 100 mL infusion (10.8 Units/hr Intravenous Rate/Dose Change 09/13/18 0317)  potassium chloride 10 mEq in 100 mL IVPB (0 mEq Intravenous Stopped 09/13/18 0330)  lactated ringers infusion ( Intravenous New Bag/Given 09/13/18 0207)  sodium chloride 0.9 % bolus 1,000 mL (0 mLs Intravenous Stopped 09/13/18 0207)      Procedures Critical Care Critical Care Documentation Critical care time provided by me (excluding procedures): 39 minutes  Condition necessitating critical care: Diabetic ketoacidosis, NSTEMI  Components of critical care management: reviewing of prior records, laboratory and imaging interpretation, frequent re-examination  and reassessment of vital signs, administration of IV fluids, IV insulin, discussion with consulting services.    ED Course and Medical Decision Making  I have reviewed the triage vital signs and the nursing notes.  Pertinent labs & imaging results that were available during my care of the patient were reviewed by me and considered in my medical decision making (see below for details).  Suspect electrolyte abnormalities in this 53 year old female with spontaneous fasciculations and spasms of the upper extremities, feels dehydrated, question of slurred speech for the past few days, worse today.  Patient has no other focal neurological deficits to suggest acute ischemic stroke at this time, labs pending.  EKG with nonspecific findings  Troponin elevated at 2.8, higher than would be expected for a type II NSTEMI.  Labs reveal DKA, started on fluids and IV insulin.  Repeat EKG shows resolution of nonspecific findings.  Discussed with cardiology, who recommends trending troponin, [facility] so that she can undergo ischemic evaluation if needed when she recovers from her DKA.  Admitted to hospital service for further care.  Elmer SowMichael M. Pilar PlateBero, MD [facility] [email or web address]  Final Clinical Impressions(s) / ED Diagnoses     ICD-10-CM   1. Diabetic ketoacidosis without coma associated with type 2 diabetes mellitus (HCC) E11.10   2. Slurred speech R47.81 DG Chest Port 1 View    DG Chest [place]  3. Elevated troponin R79.89     ED Discharge Orders    None         Sabas SousBero, Michael M,  MD 09/13/18 [phone]

## 2018-09-13 NOTE — Progress Notes (Signed)
ANTICOAGULATION CONSULT NOTE - Follow-Up Consult  Pharmacy Consult for IV Heparin Indication: chest pain/ACS  No Known Allergies  Patient Measurements: Height: 5\' 6"  (167.6 cm) Weight: 189 lb (85.7 kg) IBW/kg (Calculated) : 59.3 Heparin Dosing Weight: 67 kg  Vital Signs: Temp: 98.3 F (36.8 C) (02/07 0656) Temp Source: Oral (02/07 0656) BP: 133/77 (02/07 0952) Pulse Rate: 79 (02/07 0952)  Labs: Recent Labs    09/13/18 0039 09/13/18 0123 09/13/18 0343 09/13/18 0426 09/13/18 1119 09/13/18 1446  HGB 15.5* 15.6*  --  15.0  --   --   HCT 49.1* 46.0  --  47.9*  --   --   PLT 300  --   --  270  --   --   HEPARINUNFRC  --   --   --   --   --  0.34  CREATININE 2.15*  --  1.92*  --  1.31*  --   CKTOTAL  --   --  131  --   --   --   TROPONINI  --   --  4.31*  --   --   --     Estimated Creatinine Clearance: 53.5 mL/min (A) (by C-G formula based on SCr of 1.31 mg/dL (H)).   Medical History: Past Medical History:  Diagnosis Date  . Hyperglycemia   . Hyperlipidemia   . Hypertension     Medications:  Scheduled:  . aspirin EC  81 mg Oral Daily  . atorvastatin  80 mg Oral q1800  . heparin  4,000 Units Intravenous Once  . metoprolol tartrate  150 mg Oral BID   Infusions:  . sodium chloride 150 mL/hr at 09/13/18 0515  . dextrose 5 % and 0.45% NaCl 150 mL/hr at 09/13/18 1136  . heparin 800 Units/hr (09/13/18 0952)  . insulin 9.2 Units/hr (09/13/18 16100907)    Assessment: [age] yoF admitted with DKA and positive troponins, started on IV heparin. Initial heparin level therapeutic, cards to evaluate ECHO before deciding on cath.  Goal of Therapy:  Heparin level 0.3-0.7 units/ml Monitor platelets by anticoagulation protocol: Yes   Plan:  -Heparin 800 units/hr -Recheck 6hr heparin level to confirm  Fredonia HighlandMichael Bitonti, PharmD, BCPS Clinical Pharmacist [phone] Please check AMION for all [facility] numbers 09/13/2018

## 2018-09-13 NOTE — Progress Notes (Signed)
ANTICOAGULATION CONSULT NOTE - Initial Consult  Pharmacy Consult for IV Heparin Indication: chest pain/ACS  No Known Allergies  Patient Measurements: Height: 5\' 6"  (167.6 cm) Weight: 189 lb (85.7 kg) IBW/kg (Calculated) : 59.3 Heparin Dosing Weight: 67 kg  Vital Signs: Temp: 97.7 F (36.5 C) (02/07 0041) Temp Source: Oral (02/07 0502) BP: 123/79 (02/07 0502) Pulse Rate: 74 (02/07 0502)  Labs: Recent Labs    09/13/18 0039 09/13/18 0123 09/13/18 0343 09/13/18 0426  HGB 15.5* 15.6*  --  15.0  HCT 49.1* 46.0  --  47.9*  PLT 300  --   --  270  CREATININE 2.15*  --  1.92*  --   CKTOTAL  --   --  131  --   TROPONINI  --   --  4.31*  --     Estimated Creatinine Clearance: 36.5 mL/min (A) (by C-G formula based on SCr of 1.92 mg/dL (H)).   Medical History: Past Medical History:  Diagnosis Date  . Hyperglycemia   . Hyperlipidemia   . Hypertension     Medications:  Scheduled:  . aspirin EC  81 mg Oral Daily  . atorvastatin  80 mg Oral q1800  . enoxaparin (LOVENOX) injection  40 mg Subcutaneous Q24H  . metoprolol tartrate  150 mg Oral BID   Infusions:  . sodium chloride 150 mL/hr at 09/13/18 0515  . dextrose 5 % and 0.45% NaCl    . insulin 10.5 Units/hr (09/13/18 0541)    Assessment: 53 yoF with slurred speech and hand pain. Troponin = 4.31 Baseline labs: H/H =15/47.9, plts = 270, aptt and INR pending  Goal of Therapy:  Heparin level 0.3-0.7 units/ml Monitor platelets by anticoagulation protocol: Yes   Plan:  Baseline aptt and PT/INR STAT Give 4000 unit IV bolus of Heparin x1 Start heparin drip at 800 units/hr Daily CBC/HL Check 1st HL in 6 hours  Susanne Greenhouse R 09/13/2018,5:45 AM

## 2018-09-13 NOTE — ED Notes (Signed)
Report given to [facility]. 

## 2018-09-13 NOTE — ED Notes (Addendum)
Urine sample and culture collected and sent to lab

## 2018-09-13 NOTE — Progress Notes (Addendum)
Inpatient Diabetes Program Recommendations  AACE/ADA: New Consensus Statement on Inpatient Glycemic Control (2015)  Target Ranges:  Prepandial:   less than 140 mg/dL      Peak postprandial:   less than 180 mg/dL (1-2 hours)      Critically ill patients:  140 - 180 mg/dL   Lab Results  Component Value Date   GLUCAP 84 09/13/2018   HGBA1C 15.0 (H) 09/13/2018    Review of Glycemic Control Results for Danielle Flores, Danielle Flores" (MRN [id number]) as of 09/13/2018 11:13  Ref. Range 09/13/2018 07:55 09/13/2018 09:01 09/13/2018 10:06 09/13/2018 11:07  Glucose-Capillary Latest Ref Range: 70 - 99 mg/dL 269 (H) 244 (H) 128 (H) 84   Diabetes history: Type 2 DM Outpatient Diabetes medications: Metformin 500 mg BID Current orders for Inpatient glycemic control: IV insulin  Inpatient Diabetes Program Recommendations:    Recommend proceeding with phase 2 of DKA order set once acidosis has corrected (CO2 >20, AG 8-12, negative ketones, drip rates <4 units/hr).   Note at time of transition: Patient MUST received basal insulin 2 hours prior to discontinuation of drip.  Will plan to see today.   Addendum '@1300' : Spoke with patient regarding diabetes and management outpatient. Patient was just taking Metformin prior to event. Admits to symptoms of fatigue and polyuria.  Reviewed patient's current A1c of 15%. Explained what a A1c is and what it measures. Also reviewed goal A1c with patient, importance of good glucose control @ home, and blood sugar goals. Reviewed path of DM, need for insulin, role of pancreas, DKA, ketones, vascular changes and comorbidites. Discussed that A1C was elevated back in Jan 2019. However, doesn't appear that patient followed up. Has applied for Medicare, but she states, "I was because I didn't have any insurance."  Patient will need a meter at discharge. Blood glucose meter kit (includes lancets and strips) ([id number]). Discussed that with current lack of insurance, patient will need  to purchase one at [facility] through [employer]. Additionally, it seems that the best option to start with would be Novolin 70/30 insulin pens at discharge. Both items are affordable options for patient. Educated patient on BID dosing, when to take, sick day rules, and reviewed survival skills. Patient able to verbalize and husband was present for conversation.  Reviewed carb counting, choosing foods that are of nutritional value and encouraged to begin looking at food labels. Will place order for dietitian consult and case management to see patient. Educated patient and spouse on insulin pen use at home. Reviewed contents of insulin flexpen starter kit. Reviewed all steps if insulin pen including attachment of needle, 2-unit air shot, dialing up dose, giving injection, removing needle, disposal of sharps, storage of unused insulin, disposal of insulin etc. Patient able to provide successful return demonstration. Also reviewed troubleshooting with insulin pen. MD to give patient Rxs for insulin pens and insulin pen needles.  @ 1600: Discussed plan of care with RN. Given current trends, information provided on how to input in carb count into glucostabilizer program. Would not recommend transition at this time, based off of current drip rates.    Thanks, Bronson Curb, MSN, RNC-OB Diabetes Coordinator [phone] (8a-5p)

## 2018-09-13 NOTE — Consult Note (Addendum)
Cardiology Consultation:   Patient ID: Danielle Flores MRN: [id number]; DOB: April 17, 1966  Admit date: 09/13/2018 Date of Consult: 09/13/2018  Primary Care Provider: Carollee Herter, Alferd Apa, DO Primary Cardiologist: New Primary Electrophysiologist:  None    Patient Profile:   Danielle Flores is a 53 y.o. female with a hx of HTN, HLD and uncontrolled DM II who is being seen today for the evaluation of elevated troponin in the setting of DKA at the request of Dr. Denton Brick.  History of Present Illness:   Danielle Flores is a 53 year old female with past medical history of hypertension, hyperlipidemia and uncontrolled DM2.  She had borderline diabetes for several years with hemoglobin A1c hovering around 6.5.  Hemoglobin A1c increased to 11.3 for the first time in January 2019.  Based on previous phone note, it looks like there were attempts to schedule appointment for follow-up and management of diabetes, however she never returned for follow-up.  She says she is compliant with her blood pressure medication today, although according to pharmacy tech, she reported she was not taking the blood pressure medication on arrival yesterday.  She has been feeling weak for the past several days.  She also admits to have some increased thirst but denies any increased urination recently.  She also denies any chest pain or shortness of breath.  On arrival to [facility] yesterday.  Her blood sugar was 928.  Creatinine was elevated 2.15, her baseline creatinine was 0.83 in January 2019.  Initial point-of-care troponin was 2.8, subsequent troponin increased to 4.  Lactic acid level was 3.1 on arrival.  Hemoglobin 15.6, whereas previous hemoglobin in January 2019 was 8.1.  Hemoglobin A1c was 15.0.  Due to slurred speech, a CT of the head was obtained which was negative for acute intracranial etiology.  EKG showed sinus rhythm with LVH.  Urinalysis was positive for >500 glucose.   Past Medical History:  Diagnosis  Date  . Hyperglycemia   . Hyperlipidemia   . Hypertension     Past Surgical History:  Procedure Laterality Date  . CESAREAN SECTION       Home Medications:  Prior to Admission medications   Medication Sig Start Date End Date Taking? Authorizing Provider  metoprolol tartrate (LOPRESSOR) 100 MG tablet Take 1.5 tablets (150 mg total) by mouth 2 (two) times daily. Needs ov before any more refills 03/15/18  Yes Lowne Chase, Yvonne R, DO  amLODipine (NORVASC) 5 MG tablet TAKE 1 TABLET (5 MG TOTAL) BY MOUTH DAILY. Patient not taking: Reported on 09/13/2018 08/06/17   Roma Schanz R, DO  blood glucose meter kit and supplies KIT Dispense based on patient and insurance preference. Use up to four times daily as directed. (FOR ICD-9 250.00, 250.01). 08/06/17   Carollee Herter, Alferd Apa, DO  metFORMIN (GLUCOPHAGE) 500 MG tablet TAKE 1 BY MOUTH 2 (TWO) TIMES DAILY WITH A MEAL. NEEDS OV BEFORE ANY MORE REFILLS Patient not taking: Reported on 09/13/2018 06/07/18   Ann Held, DO    Inpatient Medications: Scheduled Meds: . aspirin EC  81 mg Oral Daily  . atorvastatin  80 mg Oral q1800  . heparin  4,000 Units Intravenous Once  . metoprolol tartrate  150 mg Oral BID   Continuous Infusions: . sodium chloride 150 mL/hr at 09/13/18 0515  . dextrose 5 % and 0.45% NaCl    . heparin    . insulin 9.2 Units/hr (09/13/18 0907)   PRN Meds:   Allergies:   No  Known Allergies  Social History:   Social History   Socioeconomic History  . Marital status: Married    Spouse name: Not on file  . Number of children: Not on file  . Years of education: Not on file  . Highest education level: Not on file  Occupational History  . Occupation: cleans houses  Social Needs  . Financial resource strain: Not on file  . Food insecurity:    Worry: Not on file    Inability: Not on file  . Transportation needs:    Medical: Not on file    Non-medical: Not on file  Tobacco Use  . Smoking status: Never  Smoker  . Smokeless tobacco: Never Used  Substance and Sexual Activity  . Alcohol use: Yes  . Drug use: No  . Sexual activity: Not on file  Lifestyle  . Physical activity:    Days per week: Not on file    Minutes per session: Not on file  . Stress: Not on file  Relationships  . Social connections:    Talks on phone: Not on file    Gets together: Not on file    Attends religious service: Not on file    Active member of club or organization: Not on file    Attends meetings of clubs or organizations: Not on file    Relationship status: Not on file  . Intimate partner violence:    Fear of current or ex partner: Not on file    Emotionally abused: Not on file    Physically abused: Not on file    Forced sexual activity: Not on file  Other Topics Concern  . Not on file  Social History Narrative  . Not on file    Family History:    Family History  Problem Relation Age of Onset  . Diabetes Other        1st degree relative  . Heart disease Maternal Grandmother        heart enlargement  . Sickle cell anemia Paternal Aunt      ROS:  Please see the history of present illness.   All other ROS reviewed and negative.     Physical Exam/Data:   Vitals:   09/13/18 0502 09/13/18 0602 09/13/18 0656 09/13/18 0700  BP: 123/79 106/66 (!) 141/75 (!) 141/75  Pulse: 74 76 76 78  Resp: '19 19 14 17  ' Temp:   98.3 F (36.8 C)   TempSrc: Oral  Oral   SpO2: 99% 100% 99%   Weight:      Height:        Intake/Output Summary (Last 24 hours) at 09/13/2018 0937 Last data filed at 09/13/2018 0330 Gross per 24 hour  Intake 1100 ml  Output -  Net 1100 ml   Last 3 Weights 09/13/2018 08/06/2017 07/30/2017  Weight (lbs) 189 lb 186 lb 12.8 oz 181 lb  Weight (kg) 85.73 kg 84.732 kg 82.101 kg     Body mass index is 30.51 kg/m.  General:  Well nourished, well developed, in no acute distress HEENT: normal Lymph: no adenopathy Neck: no JVD Endocrine:  No thryomegaly Vascular: No carotid bruits;  FA pulses 2+ bilaterally without bruits  Cardiac:  normal S1, S2; RRR; no murmur  Lungs:  clear to auscultation bilaterally, no wheezing, rhonchi or rales  Abd: soft, nontender, no hepatomegaly  Ext: no edema Musculoskeletal:  No deformities, BUE and BLE strength normal and equal Skin: warm and dry  Neuro:  CNs 2-12 intact, no  focal abnormalities noted Psych:  Normal affect   EKG:  The EKG was personally reviewed and demonstrates: Normal sinus rhythm with LVH Telemetry:  Telemetry was personally reviewed and demonstrates: Normal sinus rhythm, no significant ST-T wave changes  Relevant CV Studies: Pending echo  Laboratory Data:  Chemistry Recent Labs  Lab 09/13/18 0039 09/13/18 0123 09/13/18 0343  NA 142 144 147*  K 5.1 4.6 4.3  CL 104  --  114*  CO2 10*  --  12*  GLUCOSE 928*  --  695*  BUN 47*  --  45*  CREATININE 2.15*  --  1.92*  CALCIUM 10.3  --  9.6  GFRNONAA 25*  --  29*  GFRAA 29*  --  33*  ANIONGAP 28*  --  21*    Recent Labs  Lab 09/13/18 0039  PROT 9.2*  ALBUMIN 4.3  AST 22  ALT 19  ALKPHOS 88  BILITOT 1.9*   Hematology Recent Labs  Lab 09/13/18 0039 09/13/18 0123 09/13/18 0426  WBC 7.7  --  7.9  RBC 6.19*  --  6.14*  HGB 15.5* 15.6* 15.0  HCT 49.1* 46.0 47.9*  MCV 79.3*  --  78.0*  MCH 25.0*  --  24.4*  MCHC 31.6  --  31.3  RDW 16.6*  --  15.7*  PLT 300  --  270   Cardiac Enzymes Recent Labs  Lab 09/13/18 0343  TROPONINI 4.31*    Recent Labs  Lab 09/13/18 0046  TROPIPOC 2.80*    BNPNo results for input(s): BNP, PROBNP in the last 168 hours.  DDimer No results for input(s): DDIMER in the last 168 hours.  Radiology/Studies:  Ct Head Wo Contrast  Result Date: 09/13/2018 CLINICAL DATA:  Slurred speech EXAM: CT HEAD WITHOUT CONTRAST TECHNIQUE: Contiguous axial images were obtained from the base of the skull through the vertex without intravenous contrast. COMPARISON:  None. FINDINGS: Brain: No evidence of acute infarction,  hemorrhage, extra-axial collection, ventriculomegaly, or mass effect. Old right basal ganglia lacunar infarct. Generalized cerebral atrophy. Periventricular white matter low attenuation likely secondary to microangiopathy. Vascular: No hyperdense vessel. No significant intracranial atherosclerotic disease. Skull: Negative for fracture or focal lesion. Sinuses/Orbits: Visualized portions of the orbits are unremarkable. Visualized portions of the paranasal sinuses and mastoid air cells are unremarkable. Other: None. IMPRESSION: No acute intracranial pathology. Electronically Signed   By: Kathreen Devoid   On: 09/13/2018 01:36   Dg Chest Port 1 View  Result Date: 09/13/2018 CLINICAL DATA:  Slurred speech, hand cramping for 48 hours EXAM: PORTABLE CHEST 1 VIEW COMPARISON:  None. FINDINGS: The heart size and mediastinal contours are within normal limits. Both lungs are clear. The visualized skeletal structures are unremarkable. IMPRESSION: No active disease. Electronically Signed   By: Kathreen Devoid   On: 09/13/2018 01:34    Assessment and Plan:   1. Elevated troponin: Occurred in the setting of DKA.  Given lack of chest pain, would not recommend invasive work-up.  Pending echocardiogram at this time, if echo is normal, may consider outpatient stress test.  2. Uncontrolled type II DM with DKA: Managed by internal medicine service, currently on insulin drip  3. Hypertension: It is very questionable whether or not she is taking the blood pressure medication at home.  EKG showed significant LVH.  Will correlate with echo finding  4. Hyperlipidemia: She is not on statin. Recommend low-dose statin   5. AKI: Occurred in the setting of DKA  6. History of anemia: Her hemoglobin in  January 2019 was 8.1, this time, she arrived with hemoglobin of 15.  This number will likely continue to go down with hydration.      For questions or updates, please contact [place] Please consult [email or web address] for contact  info under      Hilbert Corrigan, [place]  09/13/2018 9:37 AM   Patient seen and examined. Agree with assessment and plan.  Danielle Flores is a 53 year old African-American female who was admitted with diabetic ketoacidosis and has been noted to have mild troponin elevation.  Patient admits to a longstanding history of hypertension since her youngest child was born (her youngest child is now 29 years old).  She states that she had been on metoprolol for this time but not on any more aggressive hypertensive medications.  She was on metformin for diabetes mellitus but apparently, she is not had routine follow-ups and hemoglobin A1c was 11.3 in January 2019 without significant subsequent evaluations.  She denies any significant episodes of chest tightness or pressure.  She had been feeling weak for several days and was admitted last evening with diabetic ketoacidosis with a blood sugar of 928, anion gap of 28, calcium 5.1, acute kidney injury with creatinine of 2.15, total bilirubin of 1.9.  She has been treated with hydration and insulin.  Troponin was 4.13.  She denies any chest tightness.  Her initial ECG shows sinus rhythm at 76 bpm, with significant left ventricular hypertrophy, slightly peaked T wave in lead V2, and nonspecific ST-T changes.  A subsequent ECG shows normal sinus rhythm at 74 bpm, with improvement in prior T wave peaking in V2, and LVH by voltage with QTc interval 462 ms.  While I was in the room, an echo Doppler study was in the process of being performed.  By my review, this shows significant concentric left ventricular hypertrophy and raises concern for a possible infiltrative process such as possible amyloid.  There is an abnormal strain pattern, and grade 2 diastolic dysfunction.  There is also a suggestion of possible mild thickening suggesting sclerosis to her aortic valve with trace AR.  On further questioning, the patient's admits that she does note some occasional finger discomfort  bilaterally although has never been diagnosed with carpal tunnel syndrome which is associated with amyloidosis.  On exam today, she is mildly hirsuite.  Blood pressure 145/75.  Sclera was anicteric. Mallinpatti 3.  Neck fullness without carotid bruits.  Lungs were without rales.  There was no chest wall tenderness.  Rhythm was regular with a 1/6 systolic murmur.  Abdomen was soft nontender without renal bruits.  There is no significant edema.  Neurologic exam is grossly nonfocal.  Her blood sugar today is significantly improved with a glucose of 86, anion gap of 12.  Lactic acid is elevated at 3.1.  Creatinine is improved at 1.31.  At present, recommend continue trending troponin levels.  Her echo Doppler study will be formally interpreted.  She may ultimately benefit from an outpatient ischemic evaluation with nuclear imaging versus coronary CTA depending upon renal function.  In addition, with her marked LVH it may be prudent to consider an evaluation for possible amyloidosis with technetium pyrophosphate scanning.  Colleagues will follow in a.m.  Troy Sine, MD, [facility] 09/13/2018 12:22 PM

## 2018-09-13 NOTE — ED Triage Notes (Signed)
Pt husband reports that when he got home around 6p, patient was having bilateral hand cramping, slurred speech and confusion. Pt speech is slurred and she is unable to answer triage questions appropriately. A&Ox2. Denies headache. Unable to test grips d/t hands cramping.

## 2018-09-13 NOTE — Progress Notes (Signed)
ANTICOAGULATION CONSULT NOTE - Follow-Up Consult  Pharmacy Consult for Heparin Indication: chest pain/ACS  No Known Allergies  Patient Measurements: Height: 5\' 6"  (167.6 cm) Weight: 189 lb (85.7 kg) IBW/kg (Calculated) : 59.3 Heparin Dosing Weight: 67 kg  Vital Signs: Temp: 99 F (37.2 C) (02/07 2054) Temp Source: Oral (02/07 2054) BP: 128/70 (02/07 2054) Pulse Rate: 66 (02/07 1608)  Labs: Recent Labs    09/13/18 0039 09/13/18 0123 09/13/18 0343 09/13/18 0426 09/13/18 1119 09/13/18 1446 09/13/18 2025  HGB 15.5* 15.6*  --  15.0  --   --   --   HCT 49.1* 46.0  --  47.9*  --   --   --   PLT 300  --   --  270  --   --   --   HEPARINUNFRC  --   --   --   --   --  0.34 0.49  CREATININE 2.15*  --  1.92*  --  1.31*  --   --   CKTOTAL  --   --  131  --   --   --   --   TROPONINI  --   --  4.31*  --   --   --   --     Estimated Creatinine Clearance: 53.5 mL/min (A) (by C-G formula based on SCr of 1.31 mg/dL (H)).  Medications:  Infusions:  . sodium chloride 150 mL/hr at 09/13/18 0515  . dextrose 5 % and 0.45% NaCl 150 mL/hr at 09/13/18 1136  . heparin 800 Units/hr (09/13/18 0952)  . insulin 6.6 Units/hr (09/13/18 2059)    Assessment: 4 yoF admitted with DKA and positive troponins, started on IV heparin.   Confirmatory heparin level is therapeutic at 0.49 on 800 units/hr. No bleeding noted.  Goal of Therapy:  Heparin level 0.3-0.7 units/ml Monitor platelets by anticoagulation protocol: Yes   Plan:  -Continue heparin drip at 800 units/hr -Daily heparin level and CBC -Monitor for s/sx of bleeding   Loura Back, PharmD, BCPS Clinical Pharmacist Clinical phone for 09/13/2018 until 10p is [phone] 09/13/2018 9:17 PM  **Pharmacist phone directory can now be found on [email or web address] listed under [facility]**

## 2018-09-13 NOTE — Progress Notes (Signed)
Patient seen and evaluated, chart reviewed, please see EMR for updated orders. Please see full H&P dictated by admitting physician dR [facility] for same date of service.     1)DKA--- hemoglobin A1c is 15, blood glucose was 928 on arrival with a bicarb of 10 and anion gap of 28----continue IV fluids, continue IV insulin per DKA protocol  2) elevated troponin--- in the setting of DKA, troponin up to 4, echocardiogram to rule out wall motion normalities and evaluate EF pending, cardiology consult appreciated, EKG without ACS type changes, continue aspirin, Lipitor, IV heparin and metoprolol  3)HTN--- EKG shows LVH, patient will need better control of BP  4)AKI----acute kidney injury suspect due to significant dehydration in the setting of DKA    creatinine on admission= 2.1 ,   baseline creatinine =  0.8  , creatinine is now= 1.9     , renally adjust medications, avoid nephrotoxic agents/dehydration/hypotension  5) chronic hypochromic and microcytic anemia----baseline hemoglobin usually between 8 and 9,, hemoglobin up to 15 at this time due to significant dehydration--- anticipate H&H will drop with hydration   Shon Hale, MD

## 2018-09-13 NOTE — ED Notes (Signed)
[facility] contacted. Paperwork printed.  

## 2018-09-13 NOTE — ED Notes (Signed)
ED TO INPATIENT HANDOFF REPORT  Name/Age/Gender Danielle Flores 53 y.o. female  Code Status    Code Status Orders  (From admission, onward)         Start     Ordered   09/13/18 0344  Full code  Continuous     09/13/18 0346        Code Status History    This patient has a current code status but no historical code status.      Home/SNF/Other Home  Chief Complaint Slurred Speech;Hand Tremors  Level of Care/Admitting Diagnosis ED Disposition    ED Disposition Condition Comment   Admit  Hospital Area: [facility] [[id number]]  Level of Care: Progressive [102]  Diagnosis: DKA (diabetic ketoacidoses) [facility]) [480165]  Admitting Physician: Eduard Clos [phone]  Attending Physician: Eduard Clos [phone]  Estimated length of stay: past midnight tomorrow  Certification:: I certify this patient will need inpatient services for at least 2 midnights  PT Class (Do Not Modify): Inpatient [101]  PT Acc Code (Do Not Modify): Private [1]       Medical History Past Medical History:  Diagnosis Date  . Hyperglycemia   . Hyperlipidemia   . Hypertension     Allergies No Known Allergies  IV Location/Drains/Wounds Patient Lines/Drains/Airways Status   Active Line/Drains/Airways    Name:   Placement date:   Placement time:   Site:   Days:   Peripheral IV 09/13/18 Left Antecubital   09/13/18    0041    Antecubital   less than 1   Peripheral IV 09/13/18 Left Antecubital   09/13/18    0226    Antecubital   less than 1          Labs/Imaging Results for orders placed or performed during the hospital encounter of 09/13/18 (from the past 48 hour(s))  CBC     Status: Abnormal   Collection Time: 09/13/18 12:39 AM  Result Value Ref Range   WBC 7.7 4.0 - 10.5 K/uL   RBC 6.19 (H) 3.87 - 5.11 MIL/uL   Hemoglobin 15.5 (H) 12.0 - 15.0 g/dL   HCT 78.6 (H) 75.4 - 49.2 %   MCV 79.3 (L) 80.0 - 100.0 fL   MCH 25.0 (L) 26.0 - 34.0 pg   MCHC 31.6 30.0 - 36.0  g/dL   RDW 01.0 (H) 07.1 - 21.9 %   Platelets 300 150 - 400 K/uL   nRBC 0.0 0.0 - 0.2 %    Comment: Performed at [facility], [address], [place], [place] [address]  Comprehensive metabolic panel     Status: Abnormal   Collection Time: 09/13/18 12:39 AM  Result Value Ref Range   Sodium 142 135 - 145 mmol/L   Potassium 5.1 3.5 - 5.1 mmol/L   Chloride 104 98 - 111 mmol/L   CO2 10 (L) 22 - 32 mmol/L   Glucose, Bld 928 (HH) 70 - 99 mg/dL    Comment: CRITICAL RESULT CALLED TO, READ BACK BY AND VERIFIED WITH: Talbert Nan RN [id number] 09/13/18 A NAVARRO    BUN 47 (H) 6 - 20 mg/dL   Creatinine, Ser 8.26 (H) 0.44 - 1.00 mg/dL   Calcium 41.5 8.9 - 83.0 mg/dL   Total Protein 9.2 (H) 6.5 - 8.1 g/dL   Albumin 4.3 3.5 - 5.0 g/dL   AST 22 15 - 41 U/L   ALT 19 0 - 44 U/L   Alkaline Phosphatase 88 38 - 126 U/L  Total Bilirubin 1.9 (H) 0.3 - 1.2 mg/dL   GFR calc non Af Amer 25 (L) >60 mL/min   GFR calc Af Amer 29 (L) >60 mL/min   Anion gap 28 (H) 5 - 15    Comment: Performed at [facility], [address] [place], [place] [address]  Lactic acid, plasma     Status: Abnormal   Collection Time: 09/13/18 12:39 AM  Result Value Ref Range   Lactic Acid, Venous 3.1 (HH) 0.5 - 1.9 mmol/L    Comment: CRITICAL RESULT CALLED TO, READ BACK BY AND VERIFIED WITH: Talbert NanJ OXENDINE RN [phone] 09/13/18 A NAVARRO Performed at [facility], [address] [place], [place] [address]   I-stat troponin, ED     Status: Abnormal   Collection Time: 09/13/18 12:46 AM  Result Value Ref Range   Troponin i, poc 2.80 (HH) 0.00 - 0.08 ng/mL   Comment NOTIFIED PHYSICIAN    Comment 3            Comment: Due to the release kinetics of cTnI, a negative result within the first hours of the onset of symptoms does not rule out myocardial infarction with certainty. If myocardial infarction is still suspected, repeat the test at appropriate intervals.   POCT I-Stat EG7      Status: Abnormal   Collection Time: 09/13/18  1:23 AM  Result Value Ref Range   pH, Ven 7.195 (LL) 7.250 - 7.430   pCO2, Ven 27.7 (L) 44.0 - 60.0 mmHg   pO2, Ven 37.0 32.0 - 45.0 mmHg   Bicarbonate 10.7 (L) 20.0 - 28.0 mmol/L   TCO2 12 (L) 22 - 32 mmol/L   O2 Saturation 59.0 %   Acid-base deficit 16.0 (H) 0.0 - 2.0 mmol/L   Sodium 144 135 - 145 mmol/L   Potassium 4.6 3.5 - 5.1 mmol/L   Calcium, Ion 1.27 1.15 - 1.40 mmol/L   HCT 46.0 36.0 - 46.0 %   Hemoglobin 15.6 (H) 12.0 - 15.0 g/dL   Patient temperature HIDE    Sample type VENOUS    Comment NOTIFIED PHYSICIAN   CBG monitoring, ED     Status: Abnormal   Collection Time: 09/13/18  1:54 AM  Result Value Ref Range   Glucose-Capillary >600 (HH) 70 - 99 mg/dL  CBG monitoring, ED     Status: Abnormal   Collection Time: 09/13/18  3:15 AM  Result Value Ref Range   Glucose-Capillary >600 (HH) 70 - 99 mg/dL  Urinalysis, Routine w reflex microscopic     Status: Abnormal   Collection Time: 09/13/18  3:17 AM  Result Value Ref Range   Color, Urine STRAW (A) YELLOW   APPearance CLEAR CLEAR   Specific Gravity, Urine 1.020 1.005 - 1.030   pH 6.0 5.0 - 8.0   Glucose, UA >=500 (A) NEGATIVE mg/dL   Hgb urine dipstick LARGE (A) NEGATIVE   Bilirubin Urine NEGATIVE NEGATIVE   Ketones, ur 80 (A) NEGATIVE mg/dL   Protein, ur NEGATIVE NEGATIVE mg/dL   Nitrite NEGATIVE NEGATIVE   Leukocytes, UA NEGATIVE NEGATIVE   RBC / HPF 11-20 0 - 5 RBC/hpf   WBC, UA 0-5 0 - 5 WBC/hpf   Bacteria, UA RARE (A) NONE SEEN   Squamous Epithelial / LPF 0-5 0 - 5   Mucus PRESENT    Hyaline Casts, UA PRESENT    Amorphous Crystal PRESENT     Comment: Performed at [facility], [address] [place], [place]  [address]   Ct Head Wo Contrast  Result Date: 09/13/2018 CLINICAL DATA:  Slurred speech EXAM: CT HEAD WITHOUT CONTRAST TECHNIQUE: Contiguous axial images were obtained from the base of the skull through the vertex without intravenous  contrast. COMPARISON:  None. FINDINGS: Brain: No evidence of acute infarction, hemorrhage, extra-axial collection, ventriculomegaly, or mass effect. Old right basal ganglia lacunar infarct. Generalized cerebral atrophy. Periventricular white matter low attenuation likely secondary to microangiopathy. Vascular: No hyperdense vessel. No significant intracranial atherosclerotic disease. Skull: Negative for fracture or focal lesion. Sinuses/Orbits: Visualized portions of the orbits are unremarkable. Visualized portions of the paranasal sinuses and mastoid air cells are unremarkable. Other: None. IMPRESSION: No acute intracranial pathology. Electronically Signed   By: Elige KoHetal  Patel   On: 09/13/2018 01:36   Dg Chest Port 1 View  Result Date: 09/13/2018 CLINICAL DATA:  Slurred speech, hand cramping for 48 hours EXAM: PORTABLE CHEST 1 VIEW COMPARISON:  None. FINDINGS: The heart size and mediastinal contours are within normal limits. Both lungs are clear. The visualized skeletal structures are unremarkable. IMPRESSION: No active disease. Electronically Signed   By: Elige KoHetal  Patel   On: 09/13/2018 01:34    Pending Labs Unresulted Labs (From admission, onward)    Start     Ordered   09/20/18 0500  Creatinine, serum  (enoxaparin (LOVENOX)    CrCl >/= 30 ml/min)  Weekly,   R    Comments:  while on enoxaparin therapy    09/13/18 0346   09/13/18 0345  Troponin I - Now Then Q6H  Now then every 6 hours,   R     09/13/18 0346   09/13/18 0345  Hemoglobin A1c  Once,   R     09/13/18 0346   09/13/18 0344  HIV antibody (Routine Testing)  Once,   R     09/13/18 0346   09/13/18 0344  Basic metabolic panel  STAT Now then every 4 hours ,   STAT     09/13/18 0346   09/13/18 0344  CBC  (enoxaparin (LOVENOX)    CrCl >/= 30 ml/min)  Once,   R    Comments:  Baseline for enoxaparin therapy IF NOT ALREADY DRAWN.  Notify MD if PLT < 100 K.    09/13/18 0346   09/13/18 0343  Troponin I - ONCE - STAT  ONCE - STAT,   STAT      09/13/18 0343   09/13/18 0343  Basic metabolic panel  Once,   R     60/45/4001/02/23 0343   09/13/18 0343  Magnesium  Once,   R     09/13/18 0343   09/13/18 0108  CK  Add-on,   STAT     09/13/18 0107          Vitals/Pain Today's Vitals   09/13/18 0212 09/13/18 0230 09/13/18 0330 09/13/18 0400  BP: (!) 154/88 (!) 167/90 (!) 158/93 (!) 156/85  Pulse: 77 73 73 74  Resp: 16 20 15 14   Temp:      TempSrc:      SpO2: 98% 99% 100% 100%  Weight:      Height:      PainSc:        Isolation Precautions No active isolations  Medications Medications  potassium chloride 10 mEq in 100 mL IVPB (10 mEq Intravenous Not Given 09/13/18 0406)  metoprolol tartrate (LOPRESSOR) tablet 150 mg (has no administration in time range)  0.9 %  sodium chloride infusion (has no administration in time range)  dextrose 5 %-0.45 % sodium chloride infusion (has no administration in time range)  insulin regular, human (MYXREDLIN) 100 units/ 100 mL infusion (has no administration in time range)  enoxaparin (LOVENOX) injection 40 mg (has no administration in time range)  aspirin EC tablet 81 mg (has no administration in time range)  sodium chloride 0.9 % bolus 1,000 mL (0 mLs Intravenous Stopped 09/13/18 0207)    Mobility walks with person assist (weak)

## 2018-09-14 LAB — CBC
HCT: 39.4 % (ref 36.0–46.0)
Hemoglobin: 13 g/dL (ref 12.0–15.0)
MCH: 24.3 pg — ABNORMAL LOW (ref 26.0–34.0)
MCHC: 33 g/dL (ref 30.0–36.0)
MCV: 73.6 fL — ABNORMAL LOW (ref 80.0–100.0)
Platelets: 212 10*3/uL (ref 150–400)
RBC: 5.35 MIL/uL — ABNORMAL HIGH (ref 3.87–5.11)
RDW: 14.3 % (ref 11.5–15.5)
WBC: 10.8 10*3/uL — ABNORMAL HIGH (ref 4.0–10.5)
nRBC: 0 % (ref 0.0–0.2)

## 2018-09-14 LAB — BASIC METABOLIC PANEL
Anion gap: 8 (ref 5–15)
BUN: 24 mg/dL — ABNORMAL HIGH (ref 6–20)
CO2: 22 mmol/L (ref 22–32)
Calcium: 9.4 mg/dL (ref 8.9–10.3)
Chloride: 119 mmol/L — ABNORMAL HIGH (ref 98–111)
Creatinine, Ser: 1.17 mg/dL — ABNORMAL HIGH (ref 0.44–1.00)
GFR calc Af Amer: >60 mL/min (ref >60)
GFR calc non Af Amer: 52 mL/min — ABNORMAL LOW (ref >60)
Glucose, Bld: 107 mg/dL — ABNORMAL HIGH (ref 70–99)
Potassium: 3.1 mmol/L — ABNORMAL LOW (ref 3.5–5.1)
Sodium: 149 mmol/L — ABNORMAL HIGH (ref 135–145)

## 2018-09-14 LAB — GLUCOSE, CAPILLARY
Glucose-Capillary: 110 mg/dL — ABNORMAL HIGH (ref 70–99)
Glucose-Capillary: 137 mg/dL — ABNORMAL HIGH (ref 70–99)
Glucose-Capillary: 142 mg/dL — ABNORMAL HIGH (ref 70–99)
Glucose-Capillary: 147 mg/dL — ABNORMAL HIGH (ref 70–99)
Glucose-Capillary: 192 mg/dL — ABNORMAL HIGH (ref 70–99)
Glucose-Capillary: 234 mg/dL — ABNORMAL HIGH (ref 70–99)
Glucose-Capillary: 254 mg/dL — ABNORMAL HIGH (ref 70–99)
Glucose-Capillary: 266 mg/dL — ABNORMAL HIGH (ref 70–99)
Glucose-Capillary: 274 mg/dL — ABNORMAL HIGH (ref 70–99)
Glucose-Capillary: 360 mg/dL — ABNORMAL HIGH (ref 70–99)
Glucose-Capillary: 98 mg/dL (ref 70–99)

## 2018-09-14 LAB — TROPONIN I
Troponin I: 0.44 ng/mL (ref <0.03)
Troponin I: 0.41 ng/mL (ref <0.03)
Troponin I: 0.41 ng/mL (ref <0.03)

## 2018-09-14 LAB — HEPARIN LEVEL (UNFRACTIONATED): HEPARIN UNFRACTIONATED: 0.49 [IU]/mL (ref 0.30–0.70)

## 2018-09-14 MED ORDER — INSULIN NPH (HUMAN) (ISOPHANE) 100 UNIT/ML ~~LOC~~ SUSP
15.0000 [IU] | Freq: Two times a day (BID) | SUBCUTANEOUS | Status: DC
  Administered 2018-09-14 – 2018-09-15 (×3): 15 [IU] via SUBCUTANEOUS
  Filled 2018-09-14: qty 10

## 2018-09-14 MED ORDER — INSULIN GLARGINE 100 UNIT/ML ~~LOC~~ SOLN
10.0000 [IU] | Freq: Once | SUBCUTANEOUS | Status: AC
  Administered 2018-09-14: 10 [IU] via SUBCUTANEOUS
  Filled 2018-09-14: qty 0.1

## 2018-09-14 MED ORDER — INSULIN ASPART 100 UNIT/ML ~~LOC~~ SOLN
0.0000 [IU] | SUBCUTANEOUS | Status: DC
  Administered 2018-09-14: 15 [IU] via SUBCUTANEOUS
  Administered 2018-09-14: 5 [IU] via SUBCUTANEOUS
  Administered 2018-09-14: 2 [IU] via SUBCUTANEOUS
  Administered 2018-09-14 (×2): 8 [IU] via SUBCUTANEOUS
  Administered 2018-09-14 (×2): 2 [IU] via SUBCUTANEOUS
  Administered 2018-09-15: 11 [IU] via SUBCUTANEOUS
  Administered 2018-09-15: 2 [IU] via SUBCUTANEOUS

## 2018-09-14 MED ORDER — POTASSIUM CHLORIDE CRYS ER 20 MEQ PO TBCR
40.0000 meq | EXTENDED_RELEASE_TABLET | Freq: Once | ORAL | Status: AC
  Administered 2018-09-14: 40 meq via ORAL
  Filled 2018-09-14: qty 2

## 2018-09-14 MED ORDER — ISOSORBIDE MONONITRATE ER 30 MG PO TB24
30.0000 mg | ORAL_TABLET | Freq: Every day | ORAL | Status: DC
  Administered 2018-09-14 – 2018-09-17 (×4): 30 mg via ORAL
  Filled 2018-09-14 (×4): qty 1

## 2018-09-14 NOTE — Progress Notes (Signed)
Progress Note  Patient Name: Danielle Flores Date of Encounter: 09/14/2018  Primary Cardiologist: New  Subjective   No complaints  Inpatient Medications    Scheduled Meds: . aspirin EC  81 mg Oral Daily  . atorvastatin  80 mg Oral q1800  . heparin  4,000 Units Intravenous Once  . insulin aspart  0-15 Units Subcutaneous Q4H  . metoprolol tartrate  150 mg Oral BID  . potassium chloride  40 mEq Oral Once   Continuous Infusions: . heparin 800 Units/hr (09/14/18 0600)   PRN Meds: hydrALAZINE, ondansetron (ZOFRAN) IV   Vital Signs    Vitals:   09/13/18 2054 09/13/18 2313 09/14/18 0318 09/14/18 0720  BP: 128/70 130/89 (!) 165/81 (!) 147/89  Pulse: 68 63 65 67  Resp:   19 20  Temp: 99 F (37.2 C) 99 F (37.2 C) 98.3 F (36.8 C) 98.4 F (36.9 C)  TempSrc: Oral Oral Oral Oral  SpO2: 99% 100% 98% 99%  Weight:      Height:        Intake/Output Summary (Last 24 hours) at 09/14/2018 1054 Last data filed at 09/14/2018 0841 Gross per 24 hour  Intake 1709.5 ml  Output -  Net 1709.5 ml   Last 3 Weights 09/13/2018 08/06/2017 07/30/2017  Weight (lbs) 189 lb 186 lb 12.8 oz 181 lb  Weight (kg) 85.73 kg 84.732 kg 82.101 kg      Telemetry    SR - Personally Reviewed  ECG    na  Physical Exam   GEN: No acute distress.   Neck: No JVD Cardiac: RRR, 2/6 systolic murmur rusb no, rubs, or gallops.  Respiratory: Clear to auscultation bilaterally. GI: Soft, nontender, non-distended  MS: No edema; No deformity. Neuro:  Nonfocal  Psych: Normal affect   Labs    Chemistry Recent Labs  Lab 09/13/18 0039  09/13/18 0343 09/13/18 1119 09/14/18 0030  NA 142   < > 147* 155* 149*  K 5.1   < > 4.3 3.7 3.1*  CL 104  --  114* 125* 119*  CO2 10*  --  12* 18* 22  GLUCOSE 928*  --  695* 86 107*  BUN 47*  --  45* 32* 24*  CREATININE 2.15*  --  1.92* 1.31* 1.17*  CALCIUM 10.3  --  9.6 9.5 9.4  PROT 9.2*  --   --   --   --   ALBUMIN 4.3  --   --   --   --   AST 22  --   --    --   --   ALT 19  --   --   --   --   ALKPHOS 88  --   --   --   --   BILITOT 1.9*  --   --   --   --   GFRNONAA 25*  --  29* 46* 52*  GFRAA 29*  --  33* 53* >60  ANIONGAP 28*  --  21* 12 8   < > = values in this interval not displayed.     Hematology Recent Labs  Lab 09/13/18 0039 09/13/18 0123 09/13/18 0426 09/14/18 0042  WBC 7.7  --  7.9 10.8*  RBC 6.19*  --  6.14* 5.35*  HGB 15.5* 15.6* 15.0 13.0  HCT 49.1* 46.0 47.9* 39.4  MCV 79.3*  --  78.0* 73.6*  MCH 25.0*  --  24.4* 24.3*  MCHC 31.6  --  31.3 33.0  RDW  16.6*  --  15.7* 14.3  PLT 300  --  270 212    Cardiac Enzymes Recent Labs  Lab 09/13/18 0343  TROPONINI 4.31*    Recent Labs  Lab 09/13/18 0046  TROPIPOC 2.80*     BNPNo results for input(s): BNP, PROBNP in the last 168 hours.   DDimer No results for input(s): DDIMER in the last 168 hours.   Radiology    Ct Head Wo Contrast  Result Date: 09/13/2018 CLINICAL DATA:  Slurred speech EXAM: CT HEAD WITHOUT CONTRAST TECHNIQUE: Contiguous axial images were obtained from the base of the skull through the vertex without intravenous contrast. COMPARISON:  None. FINDINGS: Brain: No evidence of acute infarction, hemorrhage, extra-axial collection, ventriculomegaly, or mass effect. Old right basal ganglia lacunar infarct. Generalized cerebral atrophy. Periventricular white matter low attenuation likely secondary to microangiopathy. Vascular: No hyperdense vessel. No significant intracranial atherosclerotic disease. Skull: Negative for fracture or focal lesion. Sinuses/Orbits: Visualized portions of the orbits are unremarkable. Visualized portions of the paranasal sinuses and mastoid air cells are unremarkable. Other: None. IMPRESSION: No acute intracranial pathology. Electronically Signed   By: Elige Ko   On: 09/13/2018 01:36   Dg Chest Port 1 View  Result Date: 09/13/2018 CLINICAL DATA:  Slurred speech, hand cramping for 48 hours EXAM: PORTABLE CHEST 1 VIEW  COMPARISON:  None. FINDINGS: The heart size and mediastinal contours are within normal limits. Both lungs are clear. The visualized skeletal structures are unremarkable. IMPRESSION: No active disease. Electronically Signed   By: Elige Ko   On: 09/13/2018 01:34    Cardiac Studies     Patient Profile     Danielle Flores is a 53 y.o. female with a hx of HTN, HLD and uncontrolled DM II who is being seen today for the evaluation of elevated troponin in the setting of DKA at the request of Dr. Mariea Clonts.  Assessment & Plan    1. Elevated troponin  - in setting of DKA - trop up to 4.3 without clear peak, continue to trend. EKG LVH with inferior/lateral ST depressoin - echo with LVEF 60-65%, severe septal hypertophy with moderate concentric LVH. Grade II diastolic dysfunction. Mild cavitary gradient peak gradient 19  - pretty significant troponin thus far without clear peak. Follow trend today, pending results would consider either invasive vs noninvasive stress and determine whether inpatient vs outpatient. Very poorly controlled DM2 is noted, along with other CAD risk factors of HTN and HL - medical therapy with ASA 81, atorva, hep gtt, lopressor 150mg  bid (home regimen). No ACE/ARB due to AKI though resolving. - no symptoms but certaintly with her very poorly controlled diabetes could have atypical or silent ishcemia.  - npo at midnight  2. DM2 Hgb A1c 15, BG 928 on admission with bicarb of 10.     For questions or updates, please contact [facility] Please consult [email or web address] for contact info under        Signed, Dina Rich, MD  09/14/2018, 10:54 AM

## 2018-09-14 NOTE — Progress Notes (Addendum)
Patient Demographics:    Danielle Flores, is a 53 y.o. female, DOB - 06-May-1966, QVZ:[id number]  Admit date - 09/13/2018   Admitting Physician Eduard Clos, MD  Outpatient Primary MD for the patient is Zola Button, Grayling Congress, DO  LOS - 1   Chief Complaint  Patient presents with  . Aphasia  . Hand Pain        Subjective:    [name] today has no fevers, no emesis,  No chest pain, significant other at bedside, questions answered,   Assessment  & Plan :    Principal Problem:   DKA, type 2 (HCC) Active Problems:   Essential hypertension   NSTEMI (non-ST elevated myocardial infarction) (HCC)   DKA (diabetic ketoacidoses) (HCC)  Brief summary 53 year old female with past medical history relevant for HTN and previously controlled diabetes admitted on 09/13/18 with DKA and elevated troponins   Plan:- 1)NSTEMI----patient remains chest pain-free, troponin peaked at 4.3, now down to 0.44 EKG LVH with inferior/lateral ST depressoin - echo with LVEF 60-65%, severe septal hypertophy with moderate concentric LVH. Grade II diastolic dysfunction. Mild cavitary gradient peak gradient 19, treat empirically with metoprolol 150 mg twice daily, isosorbide 30 mg daily, Lipitor, continue IV heparin, may need LHC defer to cardiology service cardiology consult and input appreciated  2)DKA----A1c 15, on admission blood glucose was 928, bicarb is 10 and anion gap was 28, DKA has resolved at this time... Off IV insulin drip, treat empirically with NPH insulin 15 units twice daily, along with sliding scale coverage  3)AKI----acute kidney injury suspect due to significant dehydration in the setting of DKA , acute kidney injury appears to have resolved with hydration,   creatinine on admission= 2.1 ,   baseline creatinine =  0.8  , creatinine is now= 1.1     , renally adjust medications, avoid nephrotoxic  agents/dehydration/hypotension  4)chronic hypochromic and microcytic anemia----baseline hemoglobin usually between 8 and 9,, hemoglobin up to 15 at this time due to significant dehydration---  hemoglobin is down to 13.0 post hydration  Disposition/Need for in-Hospital Stay- patient unable to be discharged at this time due to NSTEMI requiring IV heparin and possibly LHC  Code Status : Full   Family Communication:   S/o at bedside  Disposition Plan  : home   Consults  :  cardiology  DVT Prophylaxis  :    Heparin -   Lab Results  Component Value Date   PLT 212 09/14/2018    Inpatient Medications  Scheduled Meds: . aspirin EC  81 mg Oral Daily  . atorvastatin  80 mg Oral q1800  . heparin  4,000 Units Intravenous Once  . insulin aspart  0-15 Units Subcutaneous Q4H  . insulin NPH Human  15 Units Subcutaneous BID AC  . isosorbide mononitrate  30 mg Oral Daily  . metoprolol tartrate  150 mg Oral BID   Continuous Infusions: . heparin 800 Units/hr (09/14/18 1543)   PRN Meds:.hydrALAZINE, ondansetron (ZOFRAN) IV    Anti-infectives (From admission, onward)   None        Objective:   Vitals:   09/14/18 0318 09/14/18 0720 09/14/18 1156 09/14/18 1511  BP: (!) 165/81 (!) 147/89 (!) 171/99 (!) 154/96  Pulse: 65 67 68  Resp: 19 20 (!) 23   Temp: 98.3 F (36.8 C) 98.4 F (36.9 C) (!) 97.3 F (36.3 C) 98.4 F (36.9 C)  TempSrc: Oral Oral Oral Oral  SpO2: 98% 99% 100%   Weight:      Height:        Wt Readings from Last 3 Encounters:  09/13/18 85.7 kg  08/06/17 84.7 kg  07/30/17 82.1 kg     Intake/Output Summary (Last 24 hours) at 09/14/2018 1606 Last data filed at 09/14/2018 0841 Gross per 24 hour  Intake 1709.5 ml  Output -  Net 1709.5 ml     Physical Exam Patient is examined daily including today on 09/14/18 , exams remain the same as of yesterday except that has changed   Gen:- Awake Alert,  In no apparent distress  HEENT:- [place].AT, No sclera  icterus Neck-Supple Neck,No JVD,.  Lungs-  CTAB , fair symmetrical air movement CV- S1, S2 normal, regular  Abd-  +ve B.Sounds, Abd Soft, No tenderness,    Extremity/Skin:- No  edema, pedal pulses present  Psych-affect is appropriate, oriented x3 Neuro-no new focal deficits, no tremors   Data Review:   Micro Results Recent Results (from the past 240 hour(s))  MRSA PCR Screening     Status: None   Collection Time: 09/13/18  7:00 AM  Result Value Ref Range Status   MRSA by PCR NEGATIVE NEGATIVE Final    Comment:        The GeneXpert MRSA Assay (FDA approved for NASAL specimens only), is one component of a comprehensive MRSA colonization surveillance program. It is not intended to diagnose MRSA infection nor to guide or monitor treatment for MRSA infections. Performed at [facility], [address] [place], [place] [address]     Radiology Reports Ct Head Wo Contrast  Result Date: 09/13/2018 CLINICAL DATA:  Slurred speech EXAM: CT HEAD WITHOUT CONTRAST TECHNIQUE: Contiguous axial images were obtained from the base of the skull through the vertex without intravenous contrast. COMPARISON:  None. FINDINGS: Brain: No evidence of acute infarction, hemorrhage, extra-axial collection, ventriculomegaly, or mass effect. Old right basal ganglia lacunar infarct. Generalized cerebral atrophy. Periventricular white matter low attenuation likely secondary to microangiopathy. Vascular: No hyperdense vessel. No significant intracranial atherosclerotic disease. Skull: Negative for fracture or focal lesion. Sinuses/Orbits: Visualized portions of the orbits are unremarkable. Visualized portions of the paranasal sinuses and mastoid air cells are unremarkable. Other: None. IMPRESSION: No acute intracranial pathology. Electronically Signed   By: Elige KoHetal  Patel   On: 09/13/2018 01:36   Dg Chest Port 1 View  Result Date: 09/13/2018 CLINICAL DATA:  Slurred speech, hand cramping for 48 hours EXAM:  PORTABLE CHEST 1 VIEW COMPARISON:  None. FINDINGS: The heart size and mediastinal contours are within normal limits. Both lungs are clear. The visualized skeletal structures are unremarkable. IMPRESSION: No active disease. Electronically Signed   By: Elige KoHetal  Patel   On: 09/13/2018 01:34     CBC Recent Labs  Lab 09/13/18 0039 09/13/18 0123 09/13/18 0426 09/14/18 0042  WBC 7.7  --  7.9 10.8*  HGB 15.5* 15.6* 15.0 13.0  HCT 49.1* 46.0 47.9* 39.4  PLT 300  --  270 212  MCV 79.3*  --  78.0* 73.6*  MCH 25.0*  --  24.4* 24.3*  MCHC 31.6  --  31.3 33.0  RDW 16.6*  --  15.7* 14.3    Chemistries  Recent Labs  Lab 09/13/18 0039 09/13/18 0123 09/13/18 0343 09/13/18 1119 09/14/18 0030  NA 142 144 147* 155* 149*  K 5.1 4.6 4.3 3.7 3.1*  CL 104  --  114* 125* 119*  CO2 10*  --  12* 18* 22  GLUCOSE 928*  --  695* 86 107*  BUN 47*  --  45* 32* 24*  CREATININE 2.15*  --  1.92* 1.31* 1.17*  CALCIUM 10.3  --  9.6 9.5 9.4  MG  --   --  3.0*  --   --   AST 22  --   --   --   --   ALT 19  --   --   --   --   ALKPHOS 88  --   --   --   --   BILITOT 1.9*  --   --   --   --    ------------------------------------------------------------------------------------------------------------------ No results for input(s): CHOL, HDL, LDLCALC, TRIG, CHOLHDL, LDLDIRECT in the last 72 hours.  Lab Results  Component Value Date   HGBA1C 15.0 (H) 09/13/2018   ------------------------------------------------------------------------------------------------------------------ No results for input(s): TSH, T4TOTAL, T3FREE, THYROIDAB in the last 72 hours.  Invalid input(s): FREET3 ------------------------------------------------------------------------------------------------------------------ No results for input(s): VITAMINB12, FOLATE, FERRITIN, TIBC, IRON, RETICCTPCT in the last 72 hours.  Coagulation profile No results for input(s): INR, PROTIME in the last 168 hours.  No results for input(s): DDIMER  in the last 72 hours.  Cardiac Enzymes Recent Labs  Lab 09/13/18 0343 09/14/18 1109  TROPONINI 4.31* 0.44*   ------------------------------------------------------------------------------------------------------------------ No results found for: BNP   Shon Hale M.D on 09/14/2018 at 4:06 PM  Go to [email or web address] - for contact info  [facility] - Office  [phone]

## 2018-09-14 NOTE — Plan of Care (Signed)
  Problem: Education: Goal: Knowledge of General Education information will improve Description Including pain rating scale, medication(s)/side effects and non-pharmacologic comfort measures Outcome: Progressing   Problem: Health Behavior/Discharge Planning: Goal: Ability to manage health-related needs will improve Outcome: Progressing   Problem: Clinical Measurements: Goal: Ability to maintain clinical measurements within normal limits will improve Outcome: Progressing Goal: Will remain free from infection Outcome: Progressing   Problem: Activity: Goal: Risk for activity intolerance will decrease Outcome: Progressing   Problem: Nutrition: Goal: Adequate nutrition will be maintained Outcome: Progressing   Problem: Coping: Goal: Level of anxiety will decrease Outcome: Progressing   Problem: Elimination: Goal: Will not experience complications related to bowel motility Outcome: Progressing Goal: Will not experience complications related to urinary retention Outcome: Progressing   Problem: Pain Managment: Goal: General experience of comfort will improve Outcome: Progressing   Problem: Safety: Goal: Ability to remain free from injury will improve Outcome: Progressing   Problem: Skin Integrity: Goal: Risk for impaired skin integrity will decrease Outcome: Progressing   Problem: Fluid Volume: Goal: Ability to achieve a balanced intake and output will improve Outcome: Progressing

## 2018-09-14 NOTE — Progress Notes (Signed)
CRITICAL VALUE ALERT  Critical Value:  Troponin 0.44   Date & Time Notied:  1300   09/14/18   Provider Notified: Notified Dr. Mariea Clonts via text page  Orders Received/Actions taken: Awaiting orders

## 2018-09-14 NOTE — Plan of Care (Signed)
  Problem: Education: Goal: Knowledge of General Education information will improve Description Including pain rating scale, medication(s)/side effects and non-pharmacologic comfort measures Outcome: Progressing   Problem: Health Behavior/Discharge Planning: Goal: Ability to manage health-related needs will improve Outcome: Progressing   Problem: Clinical Measurements: Goal: Ability to maintain clinical measurements within normal limits will improve Outcome: Progressing Goal: Will remain free from infection Outcome: Progressing Goal: Diagnostic test results will improve Outcome: Progressing Goal: Cardiovascular complication will be avoided Outcome: Progressing   Problem: Activity: Goal: Risk for activity intolerance will decrease Outcome: Progressing   Problem: Nutrition: Goal: Adequate nutrition will be maintained Outcome: Progressing   Problem: Coping: Goal: Level of anxiety will decrease Outcome: Progressing   Problem: Elimination: Goal: Will not experience complications related to bowel motility Outcome: Progressing Goal: Will not experience complications related to urinary retention Outcome: Progressing   Problem: Pain Managment: Goal: General experience of comfort will improve Outcome: Progressing   Problem: Safety: Goal: Ability to remain free from injury will improve Outcome: Progressing   Problem: Skin Integrity: Goal: Risk for impaired skin integrity will decrease Outcome: Progressing   Problem: Education: Goal: Ability to describe self-care measures that may prevent or decrease complications (Diabetes Survival Skills Education) will improve Outcome: Progressing   Problem: Cardiac: Goal: Ability to maintain an adequate cardiac output will improve Outcome: Progressing   Problem: Health Behavior/Discharge Planning: Goal: Ability to identify and utilize available resources and services will improve Outcome: Progressing Goal: Ability to manage  health-related needs will improve Outcome: Progressing   Problem: Fluid Volume: Goal: Ability to achieve a balanced intake and output will improve Outcome: Progressing   Problem: Metabolic: Goal: Ability to maintain appropriate glucose levels will improve Outcome: Progressing

## 2018-09-14 NOTE — Progress Notes (Signed)
ANTICOAGULATION CONSULT NOTE - Follow-Up Consult  Pharmacy Consult for Heparin Indication: chest pain/ACS  No Known Allergies  Patient Measurements: Height: 5\' 6"  (167.6 cm) Weight: 189 lb (85.7 kg) IBW/kg (Calculated) : 59.3 Heparin Dosing Weight: 67 kg  Vital Signs: Temp: 98.4 F (36.9 C) (02/08 0720) Temp Source: Oral (02/08 0720) BP: 147/89 (02/08 0720) Pulse Rate: 67 (02/08 0720)  Labs: Recent Labs    09/13/18 0039 09/13/18 0123 09/13/18 0343 09/13/18 0426 09/13/18 1119 09/13/18 1446 09/13/18 2025 09/14/18 0030 09/14/18 0042  HGB 15.5* 15.6*  --  15.0  --   --   --   --  13.0  HCT 49.1* 46.0  --  47.9*  --   --   --   --  39.4  PLT 300  --   --  270  --   --   --   --  212  HEPARINUNFRC  --   --   --   --   --  0.34 0.49 0.49  --   CREATININE 2.15*  --  1.92*  --  1.31*  --   --  1.17*  --   CKTOTAL  --   --  131  --   --   --   --   --   --   TROPONINI  --   --  4.31*  --   --   --   --   --   --     Estimated Creatinine Clearance: 60 mL/min (A) (by C-G formula based on SCr of 1.17 mg/dL (H)).  Medications:  Infusions:  . heparin 800 Units/hr (09/14/18 0600)    Assessment: 44 yoF admitted with DKA and positive troponins, started on IV heparin.   Heparin level remains therapeutic this morning.  No overt bleeding or complications noted.  Goal of Therapy:  Heparin level 0.3-0.7 units/ml Monitor platelets by anticoagulation protocol: Yes   Plan:  -Continue heparin drip at 800 units/hr -Daily heparin level and CBC -Monitor for s/sx of bleeding   Jenetta Downer, [facility] Clinical Pharmacist Phone [phone]  09/14/2018 7:57 AM   **Pharmacist phone directory can now be found on [email or web address] listed under [facility]**

## 2018-09-15 LAB — GLUCOSE, CAPILLARY
GLUCOSE-CAPILLARY: 128 mg/dL — AB (ref 70–99)
Glucose-Capillary: 89 mg/dL (ref 70–99)
Glucose-Capillary: 332 mg/dL — ABNORMAL HIGH (ref 70–99)
Glucose-Capillary: 173 mg/dL — ABNORMAL HIGH (ref 70–99)
Glucose-Capillary: 263 mg/dL — ABNORMAL HIGH (ref 70–99)
Glucose-Capillary: 229 mg/dL — ABNORMAL HIGH (ref 70–99)

## 2018-09-15 LAB — CBC
HCT: 34.1 % — ABNORMAL LOW (ref 36.0–46.0)
Hemoglobin: 11.7 g/dL — ABNORMAL LOW (ref 12.0–15.0)
MCH: 25.2 pg — ABNORMAL LOW (ref 26.0–34.0)
MCHC: 34.3 g/dL (ref 30.0–36.0)
MCV: 73.3 fL — ABNORMAL LOW (ref 80.0–100.0)
Platelets: 159 10*3/uL (ref 150–400)
RBC: 4.65 MIL/uL (ref 3.87–5.11)
RDW: 14.3 % (ref 11.5–15.5)
WBC: 6.7 10*3/uL (ref 4.0–10.5)
nRBC: 0 % (ref 0.0–0.2)

## 2018-09-15 LAB — HEPARIN LEVEL (UNFRACTIONATED): Heparin Unfractionated: 0.45 IU/mL (ref 0.30–0.70)

## 2018-09-15 MED ORDER — ASPIRIN 81 MG PO CHEW
81.0000 mg | CHEWABLE_TABLET | ORAL | Status: AC
  Administered 2018-09-16: 81 mg via ORAL
  Filled 2018-09-15: qty 1

## 2018-09-15 MED ORDER — SODIUM CHLORIDE 0.9 % IV SOLN: 250.0000 mL | INTRAVENOUS | Status: DC | PRN

## 2018-09-15 MED ORDER — INSULIN ASPART 100 UNIT/ML ~~LOC~~ SOLN
4.0000 [IU] | Freq: Three times a day (TID) | SUBCUTANEOUS | Status: DC
  Administered 2018-09-15 – 2018-09-17 (×5): 4 [IU] via SUBCUTANEOUS

## 2018-09-15 MED ORDER — SODIUM CHLORIDE 0.9 % WEIGHT BASED INFUSION
1.0000 mL/kg/h | INTRAVENOUS | Status: DC
  Administered 2018-09-16: 1 mL/kg/h via INTRAVENOUS

## 2018-09-15 MED ORDER — INSULIN ASPART 100 UNIT/ML ~~LOC~~ SOLN
0.0000 [IU] | Freq: Every day | SUBCUTANEOUS | Status: DC
  Administered 2018-09-15: 2 [IU] via SUBCUTANEOUS

## 2018-09-15 MED ORDER — INSULIN ASPART 100 UNIT/ML ~~LOC~~ SOLN
0.0000 [IU] | Freq: Three times a day (TID) | SUBCUTANEOUS | Status: DC
  Administered 2018-09-15: 3 [IU] via SUBCUTANEOUS
  Administered 2018-09-16 – 2018-09-17 (×2): 8 [IU] via SUBCUTANEOUS
  Administered 2018-09-17: 2 [IU] via SUBCUTANEOUS
  Administered 2018-09-17: 5 [IU] via SUBCUTANEOUS

## 2018-09-15 MED ORDER — SODIUM CHLORIDE 0.9% FLUSH: 3.0000 mL | INTRAVENOUS | Status: DC | PRN

## 2018-09-15 MED ORDER — SODIUM CHLORIDE 0.9% FLUSH
3.0000 mL | Freq: Two times a day (BID) | INTRAVENOUS | Status: DC
  Administered 2018-09-15 – 2018-09-16 (×2): 3 mL via INTRAVENOUS

## 2018-09-15 MED ORDER — SODIUM CHLORIDE 0.9 % WEIGHT BASED INFUSION
3.0000 mL/kg/h | INTRAVENOUS | Status: AC
  Administered 2018-09-16: 3 mL/kg/h via INTRAVENOUS

## 2018-09-15 NOTE — Progress Notes (Signed)
Patient Demographics:    Danielle Flores, is a 53 y.o. female, DOB - 1965-10-18, [id number] Flores date - 09/13/2018   Admitting Physician Eduard Clos, MD  Outpatient Primary MD for the patient is Zola Button, Grayling Congress, DO  LOS - 2   Chief Complaint  Patient presents with  . Aphasia  . Hand Pain        Subjective:    [name] today has no fevers, no emesis,  No chest pain, no shortness of breath, no palpitations or dizziness  Assessment  & Plan :    Principal Problem:   DKA, type 2 (HCC) Active Problems:   Essential hypertension   NSTEMI (non-ST elevated myocardial infarction) (HCC)   DKA (diabetic ketoacidoses) [facility])  Brief summary 53 year old female with past medical history relevant for HTN and previously controlled diabetes admitted on 09/13/18 with DKA and elevated troponins, plan is for [facility] on 09/16/2018 currently on IV heparin   Plan:- 1)NSTEMI----patient remains chest pain-free, troponin peaked at 4.3, now down to 0.44 EKG LVH with inferior/lateral ST depressoin - echo with LVEF 60-65%, severe septal hypertophy with moderate concentric LVH. Grade II diastolic dysfunction. Mild cavitary gradient peak gradient 19, treat empirically with metoprolol 150 mg twice daily, isosorbide 30 mg daily, Lipitor, continue IV heparin, Plan is  for  [facility] on 09/16/2018 per cardiology service,  cardiology consult and input appreciated  2)DKA----A1c 15, on admission blood glucose was 928, bicarb is 10 and anion gap was 28, DKA has resolved at this time... Off IV insulin drip, continue NPH insulin 15 units twice daily, along with sliding scale coverage, add mealtime coverage  3)AKI----acute kidney injury suspect due to significant dehydration in the setting of DKA , acute kidney injury appears to have resolved with hydration,   creatinine on admission= 2.1 ,   baseline creatinine =  0.8  , creatinine  is now= 1.1     , renally adjust medications, avoid nephrotoxic agents/dehydration/hypotension  4)chronic hypochromic and microcytic anemia----baseline hemoglobin usually between 8 and 9,, hemoglobin up to 15 at this time due to significant dehydration---  hemoglobin is down to 13.0 post hydration  Disposition/Need for in-Hospital Stay- patient unable to be discharged at this time due to NSTEMI requiring IV heparin and  LHC on 09/16/2018  Code Status : Full   Family Communication:   S/o at bedside  Disposition Plan  : home   Consults  :  cardiology  DVT Prophylaxis  :    Iv Heparin -   Lab Results  Component Value Date   PLT 159 09/15/2018    Inpatient Medications  Scheduled Meds: . aspirin EC  81 mg Oral Daily  . atorvastatin  80 mg Oral q1800  . heparin  4,000 Units Intravenous Once  . insulin aspart  0-15 Units Subcutaneous TID WC  . insulin aspart  0-5 Units Subcutaneous QHS  . insulin aspart  4 Units Subcutaneous TID WC  . insulin NPH Human  15 Units Subcutaneous BID AC & HS  . isosorbide mononitrate  30 mg Oral Daily  . metoprolol tartrate  150 mg Oral BID   Continuous Infusions: . heparin 800 Units/hr (09/15/18 0600)   PRN Meds:.hydrALAZINE, ondansetron (ZOFRAN) IV    Anti-infectives (From  admission, onward)   None        Objective:   Vitals:   09/14/18 2321 09/15/18 0412 09/15/18 0732 09/15/18 1544  BP: (!) 134/91 137/86 (!) 156/80 (!) 137/92  Pulse: 72 77 78 76  Resp: (!) 23  Temp: 98.7 F (37.1 C) 98.4 F (36.9 C) 98.3 F (36.8 C) 98.9 F (37.2 C)  TempSrc: Oral Oral Oral Oral  SpO2: 100% 99% 99% 98%  Weight:      Height:        Wt Readings from Last 3 Encounters:  09/13/18 85.7 kg  08/06/17 84.7 kg  07/30/17 82.1 kg     Intake/Output Summary (Last 24 hours) at 09/15/2018 1714 Last data filed at 09/15/2018 0600 Gross per 24 hour  Intake 187.27 ml  Output -  Net 187.27 ml     Physical Exam Patient is examined daily  including today on 09/15/18 , exams remain the same as of yesterday except that has changed   Gen:- Awake Alert,  In no apparent distress  HEENT:- [place].AT, No sclera icterus Neck-Supple Neck,No JVD,.  Lungs-  CTAB , fair symmetrical air movement CV- S1, S2 normal, regular  Abd-  +ve B.Sounds, Abd Soft, No tenderness,    Extremity/Skin:- No  edema, pedal pulses present  Psych-affect is appropriate, oriented x3 Neuro-no new focal deficits, no tremors   Data Review:   Micro Results Recent Results (from the past 240 hour(s))  MRSA PCR Screening     Status: None   Collection Time: 09/13/18  7:00 AM  Result Value Ref Range Status   MRSA by PCR NEGATIVE NEGATIVE Final    Comment:        The GeneXpert MRSA Assay (FDA approved for NASAL specimens only), is one component of a comprehensive MRSA colonization surveillance program. It is not intended to diagnose MRSA infection nor to guide or monitor treatment for MRSA infections. Performed at [facility], [address] [place], [place] [address]     Radiology Reports Ct Head Wo Contrast  Result Date: 09/13/2018 CLINICAL DATA:  Slurred speech EXAM: CT HEAD WITHOUT CONTRAST TECHNIQUE: Contiguous axial images were obtained from the base of the skull through the vertex without intravenous contrast. COMPARISON:  None. FINDINGS: Brain: No evidence of acute infarction, hemorrhage, extra-axial collection, ventriculomegaly, or mass effect. Old right basal ganglia lacunar infarct. Generalized cerebral atrophy. Periventricular white matter low attenuation likely secondary to microangiopathy. Vascular: No hyperdense vessel. No significant intracranial atherosclerotic disease. Skull: Negative for fracture or focal lesion. Sinuses/Orbits: Visualized portions of the orbits are unremarkable. Visualized portions of the paranasal sinuses and mastoid air cells are unremarkable. Other: None. IMPRESSION: No acute intracranial pathology. Electronically Signed    By: Elige Ko   On: 09/13/2018 01:36   Dg Chest Port 1 View  Result Date: 09/13/2018 CLINICAL DATA:  Slurred speech, hand cramping for 48 hours EXAM: PORTABLE CHEST 1 VIEW COMPARISON:  None. FINDINGS: The heart size and mediastinal contours are within normal limits. Both lungs are clear. The visualized skeletal structures are unremarkable. IMPRESSION: No active disease. Electronically Signed   By: Elige Ko   On: 09/13/2018 01:34     CBC Recent Labs  Lab 09/13/18 0039 09/13/18 0123 09/13/18 0426 09/14/18 0042 09/15/18 0228  WBC 7.7  --  7.9 10.8* 6.7  HGB 15.5* 15.6* 15.0 13.0 11.7*  HCT 49.1* 46.0 47.9* 39.4 34.1*  PLT 300  --  270 212 159  MCV 79.3*  --  78.0* 73.6* 73.3*  MCH 25.0*  --  24.4* 24.3* 25.2*  MCHC 31.6  --  31.3 33.0 34.3  RDW 16.6*  --  15.7* 14.3 14.3    Chemistries  Recent Labs  Lab 09/13/18 0039 09/13/18 0123 09/13/18 0343 09/13/18 1119 09/14/18 0030  NA 142 144 147* 155* 149*  K 5.1 4.6 4.3 3.7 3.1*  CL 104  --  114* 125* 119*  CO2 10*  --  12* 18* 22  GLUCOSE 928*  --  695* 86 107*  BUN 47*  --  45* 32* 24*  CREATININE 2.15*  --  1.92* 1.31* 1.17*  CALCIUM 10.3  --  9.6 9.5 9.4  MG  --   --  3.0*  --   --   AST 22  --   --   --   --   ALT 19  --   --   --   --   ALKPHOS 88  --   --   --   --   BILITOT 1.9*  --   --   --   --    ------------------------------------------------------------------------------------------------------------------ No results for input(s): CHOL, HDL, LDLCALC, TRIG, CHOLHDL, LDLDIRECT in the last 72 hours.  Lab Results  Component Value Date   HGBA1C 15.0 (H) 09/13/2018   ------------------------------------------------------------------------------------------------------------------ No results for input(s): TSH, T4TOTAL, T3FREE, THYROIDAB in the last 72 hours.  Invalid input(s): FREET3 ------------------------------------------------------------------------------------------------------------------ No  results for input(s): VITAMINB12, FOLATE, FERRITIN, TIBC, IRON, RETICCTPCT in the last 72 hours.  Coagulation profile No results for input(s): INR, PROTIME in the last 168 hours.  No results for input(s): DDIMER in the last 72 hours.  Cardiac Enzymes Recent Labs  Lab 09/14/18 1109 09/14/18 1608 09/14/18 2257  TROPONINI 0.44* 0.41* 0.41*   ------------------------------------------------------------------------------------------------------------------ No results found for: BNP   Shon Hale M.D on 09/15/2018 at 5:14 PM  Go to [email or web address] - for contact info  [facility] - Office  [phone]

## 2018-09-15 NOTE — Progress Notes (Addendum)
ANTICOAGULATION CONSULT NOTE - Follow-Up Consult  Pharmacy Consult for Heparin Indication: chest pain/ACS  No Known Allergies  Patient Measurements: Height: 5\' 6"  (167.6 cm) Weight: 189 lb (85.7 kg) IBW/kg (Calculated) : 59.3 Heparin Dosing Weight: 67 kg  Vital Signs: Temp: 98.3 F (36.8 C) (02/09 0732) Temp Source: Oral (02/09 0732) BP: 156/80 (02/09 0732) Pulse Rate: 78 (02/09 0732)  Labs: Recent Labs    09/13/18 0343 09/13/18 0426 09/13/18 1119  09/13/18 2025 09/14/18 0030 09/14/18 0042 09/14/18 1109 09/14/18 1608 09/14/18 2257 09/15/18 0228  HGB  --  15.0  --   --   --   --  13.0  --   --   --  11.7*  HCT  --  47.9*  --   --   --   --  39.4  --   --   --  34.1*  PLT  --  270  --   --   --   --  212  --   --   --  159  HEPARINUNFRC  --   --   --    < > 0.49 0.49  --   --   --   --  0.45  CREATININE 1.92*  --  1.31*  --   --  1.17*  --   --   --   --   --   CKTOTAL 131  --   --   --   --   --   --   --   --   --   --   TROPONINI 4.31*  --   --   --   --   --   --  0.44* 0.41* 0.41*  --    < > = values in this interval not displayed.    Estimated Creatinine Clearance: 60 mL/min (A) (by C-G formula based on SCr of 1.17 mg/dL (H)).  Medications:  Infusions:  . heparin 800 Units/hr (09/15/18 0600)    Assessment: [age] yoF admitted with DKA and positive troponins, started on IV heparin.   Heparin level remains therapeutic this morning.  No overt bleeding or complications noted, although Hgb and Pltc trending down.  Goal of Therapy:  Heparin level 0.3-0.7 units/ml Monitor platelets by anticoagulation protocol: Yes   Plan:  -Continue heparin drip at 800 units/hr -Daily heparin level and CBC -Watch Platelet count carefully. -Monitor for s/sx of bleeding   Jenetta DownerJessica Mechel Haggard, Pharm D, BCPS, [facility] Clinical Pharmacist Phone [phone]  09/15/2018 8:09 AM   **Pharmacist phone directory can now be found on [email or web address] listed under [facility]**

## 2018-09-15 NOTE — Plan of Care (Signed)
  Problem: Education: Goal: Knowledge of General Education information will improve Description Including pain rating scale, medication(s)/side effects and non-pharmacologic comfort measures Outcome: Progressing   Problem: Health Behavior/Discharge Planning: Goal: Ability to manage health-related needs will improve Outcome: Progressing   Problem: Clinical Measurements: Goal: Ability to maintain clinical measurements within normal limits will improve Outcome: Progressing Goal: Will remain free from infection Outcome: Progressing Goal: Diagnostic test results will improve Outcome: Progressing Goal: Cardiovascular complication will be avoided Outcome: Progressing   Problem: Activity: Goal: Risk for activity intolerance will decrease Outcome: Progressing   Problem: Nutrition: Goal: Adequate nutrition will be maintained Outcome: Progressing   Problem: Coping: Goal: Level of anxiety will decrease Outcome: Progressing   Problem: Elimination: Goal: Will not experience complications related to bowel motility Outcome: Progressing Goal: Will not experience complications related to urinary retention Outcome: Progressing   Problem: Pain Managment: Goal: General experience of comfort will improve Outcome: Progressing   Problem: Safety: Goal: Ability to remain free from injury will improve Outcome: Progressing   Problem: Skin Integrity: Goal: Risk for impaired skin integrity will decrease Outcome: Progressing   Problem: Education: Goal: Ability to describe self-care measures that may prevent or decrease complications (Diabetes Survival Skills Education) will improve Outcome: Progressing Goal: Individualized Educational Video(s) Outcome: Progressing   Problem: Cardiac: Goal: Ability to maintain an adequate cardiac output will improve Outcome: Progressing   Problem: Health Behavior/Discharge Planning: Goal: Ability to identify and utilize available resources and services  will improve Outcome: Progressing Goal: Ability to manage health-related needs will improve Outcome: Progressing   Problem: Fluid Volume: Goal: Ability to achieve a balanced intake and output will improve Outcome: Progressing   Problem: Metabolic: Goal: Ability to maintain appropriate glucose levels will improve Outcome: Progressing

## 2018-09-15 NOTE — Progress Notes (Addendum)
Progress Note  Patient Name: Danielle Flores Date of Encounter: 09/15/2018  Primary Cardiologist: New  Subjective   No complaints  Inpatient Medications    Scheduled Meds: . aspirin EC  81 mg Oral Daily  . atorvastatin  80 mg Oral q1800  . heparin  4,000 Units Intravenous Once  . insulin aspart  0-15 Units Subcutaneous Q4H  . insulin NPH Human  15 Units Subcutaneous BID AC & HS  . isosorbide mononitrate  30 mg Oral Daily  . metoprolol tartrate  150 mg Oral BID   Continuous Infusions: . heparin 800 Units/hr (09/15/18 0600)   PRN Meds: hydrALAZINE, ondansetron (ZOFRAN) IV   Vital Signs    Vitals:   09/14/18 1941 09/14/18 2321 09/15/18 0412 09/15/18 0732  BP: (!) 141/74 (!) 134/91 137/86 (!) 156/80  Pulse: 83 72 77 78  Resp: (!) 22 16 17 16   Temp: 98.4 F (36.9 C) 98.7 F (37.1 C) 98.4 F (36.9 C) 98.3 F (36.8 C)  TempSrc: Oral Oral Oral Oral  SpO2: 98% 100% 99% 99%  Weight:      Height:        Intake/Output Summary (Last 24 hours) at 09/15/2018 1054 Last data filed at 09/15/2018 0600 Gross per 24 hour  Intake 187.27 ml  Output 400 ml  Net -212.73 ml   Last 3 Weights 09/13/2018 08/06/2017 07/30/2017  Weight (lbs) 189 lb 186 lb 12.8 oz 181 lb  Weight (kg) 85.73 kg 84.732 kg 82.101 kg      Telemetry    SR - Personally Reviewed  ECG    na  Physical Exam   GEN: No acute distress.   Neck: No JVD Cardiac: RRR, no murmurs, rubs, or gallops.  Respiratory: Clear to auscultation bilaterally. GI: Soft, nontender, non-distended  MS: No edema; No deformity. Neuro:  Nonfocal  Psych: Normal affect   Labs    Chemistry Recent Labs  Lab 09/13/18 0039  09/13/18 0343 09/13/18 1119 09/14/18 0030  NA 142   < > 147* 155* 149*  K 5.1   < > 4.3 3.7 3.1*  CL 104  --  114* 125* 119*  CO2 10*  --  12* 18* 22  GLUCOSE 928*  --  695* 86 107*  BUN 47*  --  45* 32* 24*  CREATININE 2.15*  --  1.92* 1.31* 1.17*  CALCIUM 10.3  --  9.6 9.5 9.4  PROT 9.2*  --   --    --   --   ALBUMIN 4.3  --   --   --   --   AST 22  --   --   --   --   ALT 19  --   --   --   --   ALKPHOS 88  --   --   --   --   BILITOT 1.9*  --   --   --   --   GFRNONAA 25*  --  29* 46* 52*  GFRAA 29*  --  33* 53* >60  ANIONGAP 28*  --  21* 12 8   < > = values in this interval not displayed.     Hematology Recent Labs  Lab 09/13/18 0426 09/14/18 0042 09/15/18 0228  WBC 7.9 10.8* 6.7  RBC 6.14* 5.35* 4.65  HGB 15.0 13.0 11.7*  HCT 47.9* 39.4 34.1*  MCV 78.0* 73.6* 73.3*  MCH 24.4* 24.3* 25.2*  MCHC 31.3 33.0 34.3  RDW 15.7* 14.3 14.3  PLT 270  212 159    Cardiac Enzymes Recent Labs  Lab 09/13/18 0343 09/14/18 1109 09/14/18 1608 09/14/18 2257  TROPONINI 4.31* 0.44* 0.41* 0.41*    Recent Labs  Lab 09/13/18 0046  TROPIPOC 2.80*     BNPNo results for input(s): BNP, PROBNP in the last 168 hours.   DDimer No results for input(s): DDIMER in the last 168 hours.   Radiology    No results found.  Cardiac Studies     Patient Profile     Danielle Flores a 53 y.o.femalewith a hx of HTN, HLD and uncontrolled DM IIwho is being seen today for the evaluation of elevated troponin in the setting of DKAat the request of Dr. Mariea Clonts.  Assessment & Plan    1. Elevated troponin  - in setting of DKA - trop peak 4.3 and rapidly trending down. EKG LVH with inferior/lateral ST depressoin - echo with LVEF 60-65%, severe septal hypertophy with moderate concentric LVH. Grade II diastolic dysfunction. Mild cavitary gradient peak gradient 19  - no cardiac symptoms.  - fairly significant troponin elevation. ST/T changes possibly LVH but can't exclude ischemia Difficult to be comfortable attributing solely to her DKA. No specific symptoms but with a Hgb A1c of 15 would worry about atypical or event silent angina. - medical therapy with ASA 81, atorva, hep gtt, lopressor 150mg  bid (home regimen). Would start ACE-I after cath   Plan for cath Monday   JEHOVAHS  WITNESS, NO BLOOD PRODUCTS  I have reviewed the risks, indications, and alternatives to cardiac catheterization, possible angioplasty, and stenting with the patient today. Risks include but are not limited to bleeding, infection, vascular injury, stroke, myocardial infection, arrhythmia, kidney injury, radiation-related injury in the case of prolonged fluoroscopy use, emergency cardiac surgery, and death. The patient understands the risks of serious complication is 1-2 in 1000 with diagnostic cardiac cath and 1-2% or less with angioplasty/stenting.    For questions or updates, please contact [facility] Please consult [email or web address] for contact info under        Signed, Dina Rich, MD  09/15/2018, 10:54 AM

## 2018-09-16 ENCOUNTER — Inpatient Hospital Stay (HOSPITAL_COMMUNITY)
Admission: EM | Disposition: A | Discharge status: Home / Self Care | Payer: Self-pay | Source: Home / Self Care | Attending: Family Medicine

## 2018-09-16 ENCOUNTER — Encounter (HOSPITAL_COMMUNITY): Payer: Self-pay | Admitting: Interventional Cardiology

## 2018-09-16 DIAGNOSIS — I2510 Atherosclerotic heart disease of native coronary artery without angina pectoris: Secondary | ICD-10-CM

## 2018-09-16 HISTORY — PX: CORONARY STENT INTERVENTION: CATH118234

## 2018-09-16 HISTORY — PX: LEFT HEART CATH AND CORONARY ANGIOGRAPHY: CATH118249

## 2018-09-16 LAB — CBC
HCT: 35.1 % — ABNORMAL LOW (ref 36.0–46.0)
Hemoglobin: 11.6 g/dL — ABNORMAL LOW (ref 12.0–15.0)
MCH: 24.8 pg — ABNORMAL LOW (ref 26.0–34.0)
MCHC: 33 g/dL (ref 30.0–36.0)
MCV: 75 fL — ABNORMAL LOW (ref 80.0–100.0)
Platelets: 131 10*3/uL — ABNORMAL LOW (ref 150–400)
RBC: 4.68 MIL/uL (ref 3.87–5.11)
RDW: 14.5 % (ref 11.5–15.5)
WBC: 5.8 10*3/uL (ref 4.0–10.5)
nRBC: 0 % (ref 0.0–0.2)

## 2018-09-16 LAB — GLUCOSE, CAPILLARY
Glucose-Capillary: 200 mg/dL — ABNORMAL HIGH (ref 70–99)
Glucose-Capillary: 167 mg/dL — ABNORMAL HIGH (ref 70–99)
Glucose-Capillary: 254 mg/dL — ABNORMAL HIGH (ref 70–99)
Glucose-Capillary: 191 mg/dL — ABNORMAL HIGH (ref 70–99)

## 2018-09-16 LAB — BASIC METABOLIC PANEL
Anion gap: 13 (ref 5–15)
BUN: 9 mg/dL (ref 6–20)
CO2: 21 mmol/L — AB (ref 22–32)
Calcium: 8.6 mg/dL — ABNORMAL LOW (ref 8.9–10.3)
Chloride: 107 mmol/L (ref 98–111)
Creatinine, Ser: 0.75 mg/dL (ref 0.44–1.00)
GFR calc Af Amer: >60 mL/min (ref >60)
GFR calc non Af Amer: >60 mL/min (ref >60)
Glucose, Bld: 155 mg/dL — ABNORMAL HIGH (ref 70–99)
Potassium: 3.1 mmol/L — ABNORMAL LOW (ref 3.5–5.1)
Sodium: 141 mmol/L (ref 135–145)

## 2018-09-16 LAB — HEPARIN LEVEL (UNFRACTIONATED): Heparin Unfractionated: 0.43 IU/mL (ref 0.30–0.70)

## 2018-09-16 LAB — POCT ACTIVATED CLOTTING TIME: Activated Clotting Time: 335 seconds

## 2018-09-16 SURGERY — LEFT HEART CATH AND CORONARY ANGIOGRAPHY
Anesthesia: LOCAL

## 2018-09-16 MED ORDER — LIDOCAINE HCL (PF) 1 % IJ SOLN
INTRAMUSCULAR | Status: AC
  Filled 2018-09-16: qty 30

## 2018-09-16 MED ORDER — POTASSIUM CHLORIDE CRYS ER 20 MEQ PO TBCR
40.0000 meq | EXTENDED_RELEASE_TABLET | Freq: Once | ORAL | Status: AC
  Administered 2018-09-16: 40 meq via ORAL

## 2018-09-16 MED ORDER — HEPARIN SODIUM (PORCINE) 5000 UNIT/ML IJ SOLN
5000.0000 [IU] | Freq: Three times a day (TID) | INTRAMUSCULAR | Status: DC
  Administered 2018-09-16 – 2018-09-17 (×2): 5000 [IU] via SUBCUTANEOUS
  Filled 2018-09-16 (×2): qty 1

## 2018-09-16 MED ORDER — LABETALOL HCL 5 MG/ML IV SOLN: 10.0000 mg | INTRAVENOUS | Status: AC | PRN

## 2018-09-16 MED ORDER — SODIUM CHLORIDE 0.9% FLUSH: 3.0000 mL | INTRAVENOUS | Status: DC | PRN

## 2018-09-16 MED ORDER — INSULIN NPH (HUMAN) (ISOPHANE) 100 UNIT/ML ~~LOC~~ SUSP
8.0000 [IU] | Freq: Two times a day (BID) | SUBCUTANEOUS | Status: DC
  Filled 2018-09-16: qty 10

## 2018-09-16 MED ORDER — VERAPAMIL HCL 2.5 MG/ML IV SOLN
INTRAVENOUS | Status: AC
  Filled 2018-09-16: qty 2

## 2018-09-16 MED ORDER — FENTANYL CITRATE (PF) 100 MCG/2ML IJ SOLN
INTRAMUSCULAR | Status: AC
  Filled 2018-09-16: qty 2

## 2018-09-16 MED ORDER — HEPARIN (PORCINE) IN NACL 1000-0.9 UT/500ML-% IV SOLN
INTRAVENOUS | Status: DC | PRN
  Administered 2018-09-16 (×2): 500 mL

## 2018-09-16 MED ORDER — POTASSIUM CHLORIDE CRYS ER 20 MEQ PO TBCR
40.0000 meq | EXTENDED_RELEASE_TABLET | Freq: Once | ORAL | Status: DC
  Filled 2018-09-16: qty 2

## 2018-09-16 MED ORDER — TICAGRELOR 90 MG PO TABS
ORAL_TABLET | ORAL | Status: DC | PRN
  Administered 2018-09-16: 180 mg via ORAL

## 2018-09-16 MED ORDER — MIDAZOLAM HCL 2 MG/2ML IJ SOLN
INTRAMUSCULAR | Status: DC | PRN
  Administered 2018-09-16: 1 mg via INTRAVENOUS
  Administered 2018-09-16: 2 mg via INTRAVENOUS

## 2018-09-16 MED ORDER — MIDAZOLAM HCL 2 MG/2ML IJ SOLN
INTRAMUSCULAR | Status: AC
  Filled 2018-09-16: qty 2

## 2018-09-16 MED ORDER — TIROFIBAN (AGGRASTAT) BOLUS VIA INFUSION
INTRAVENOUS | Status: DC | PRN
  Administered 2018-09-16: 2142.5 ug via INTRAVENOUS

## 2018-09-16 MED ORDER — TIROFIBAN HCL IN NACL 5-0.9 MG/100ML-% IV SOLN
INTRAVENOUS | Status: AC
  Filled 2018-09-16: qty 100

## 2018-09-16 MED ORDER — TICAGRELOR 90 MG PO TABS
90.0000 mg | ORAL_TABLET | Freq: Two times a day (BID) | ORAL | Status: DC
  Administered 2018-09-16 – 2018-09-17 (×2): 90 mg via ORAL
  Filled 2018-09-16 (×2): qty 1

## 2018-09-16 MED ORDER — LIDOCAINE HCL (PF) 1 % IJ SOLN
INTRAMUSCULAR | Status: DC | PRN
  Administered 2018-09-16: 2 mL

## 2018-09-16 MED ORDER — FENTANYL CITRATE (PF) 100 MCG/2ML IJ SOLN
INTRAMUSCULAR | Status: DC | PRN
  Administered 2018-09-16 (×2): 25 ug via INTRAVENOUS

## 2018-09-16 MED ORDER — METOPROLOL TARTRATE 5 MG/5ML IV SOLN
INTRAVENOUS | Status: DC | PRN
  Administered 2018-09-16: 5 mg via INTRAVENOUS

## 2018-09-16 MED ORDER — HEPARIN (PORCINE) IN NACL 1000-0.9 UT/500ML-% IV SOLN
INTRAVENOUS | Status: AC
  Filled 2018-09-16: qty 500

## 2018-09-16 MED ORDER — METOPROLOL TARTRATE 5 MG/5ML IV SOLN
INTRAVENOUS | Status: AC
  Filled 2018-09-16: qty 5

## 2018-09-16 MED ORDER — IOHEXOL 350 MG/ML SOLN
INTRAVENOUS | Status: DC | PRN
  Administered 2018-09-16: 120 mL via INTRAVENOUS

## 2018-09-16 MED ORDER — SODIUM CHLORIDE 0.9% FLUSH
3.0000 mL | Freq: Two times a day (BID) | INTRAVENOUS | Status: DC
  Administered 2018-09-16 – 2018-09-17 (×2): 3 mL via INTRAVENOUS

## 2018-09-16 MED ORDER — POTASSIUM CHLORIDE CRYS ER 20 MEQ PO TBCR
40.0000 meq | EXTENDED_RELEASE_TABLET | Freq: Once | ORAL | Status: AC
  Administered 2018-09-16: 40 meq via ORAL
  Filled 2018-09-16: qty 2

## 2018-09-16 MED ORDER — ONDANSETRON HCL 4 MG/2ML IJ SOLN: 4.0000 mg | Freq: Four times a day (QID) | INTRAMUSCULAR | Status: DC | PRN

## 2018-09-16 MED ORDER — TICAGRELOR 90 MG PO TABS
ORAL_TABLET | ORAL | Status: AC
  Filled 2018-09-16: qty 2

## 2018-09-16 MED ORDER — NITROGLYCERIN 1 MG/10 ML FOR IR/CATH LAB
INTRA_ARTERIAL | Status: AC
  Filled 2018-09-16: qty 10

## 2018-09-16 MED ORDER — ASPIRIN 81 MG PO CHEW: 81.0000 mg | CHEWABLE_TABLET | Freq: Every day | ORAL | Status: DC

## 2018-09-16 MED ORDER — NITROGLYCERIN 1 MG/10 ML FOR IR/CATH LAB
INTRA_ARTERIAL | Status: DC | PRN
  Administered 2018-09-16: 400 ug via INTRA_ARTERIAL
  Administered 2018-09-16: 200 ug via INTRACORONARY

## 2018-09-16 MED ORDER — VERAPAMIL HCL 2.5 MG/ML IV SOLN
INTRAVENOUS | Status: DC | PRN
  Administered 2018-09-16: 10 mL via INTRA_ARTERIAL

## 2018-09-16 MED ORDER — INSULIN NPH (HUMAN) (ISOPHANE) 100 UNIT/ML ~~LOC~~ SUSP
15.0000 [IU] | Freq: Two times a day (BID) | SUBCUTANEOUS | Status: DC
  Administered 2018-09-16 – 2018-09-17 (×2): 15 [IU] via SUBCUTANEOUS
  Filled 2018-09-16: qty 10

## 2018-09-16 MED ORDER — INSULIN NPH (HUMAN) (ISOPHANE) 100 UNIT/ML ~~LOC~~ SUSP
8.0000 [IU] | Freq: Two times a day (BID) | SUBCUTANEOUS | Status: DC
  Administered 2018-09-16: 8 [IU] via SUBCUTANEOUS
  Filled 2018-09-16: qty 10

## 2018-09-16 MED ORDER — HEPARIN SODIUM (PORCINE) 1000 UNIT/ML IJ SOLN
INTRAMUSCULAR | Status: DC | PRN
  Administered 2018-09-16: 5500 [IU] via INTRAVENOUS
  Administered 2018-09-16: 4500 [IU] via INTRAVENOUS

## 2018-09-16 MED ORDER — HEPARIN SODIUM (PORCINE) 1000 UNIT/ML IJ SOLN
INTRAMUSCULAR | Status: AC
  Filled 2018-09-16: qty 1

## 2018-09-16 SURGICAL SUPPLY — 18 items
BALLN SAPPHIRE 2.0X15 (BALLOONS) ×2
BALLN SAPPHIRE ~~LOC~~ 2.75X10 (BALLOONS) ×1 IMPLANT
BALLOON SAPPHIRE 2.0X15 (BALLOONS) IMPLANT
CATH 5FR JL3.5 JR4 ANG PIG MP (CATHETERS) ×1 IMPLANT
CATH LAUNCHER 6FR JR4 (CATHETERS) ×1 IMPLANT
DEVICE RAD COMP TR BAND LRG (VASCULAR PRODUCTS) ×1 IMPLANT
GLIDESHEATH SLEND SS 6F .021 (SHEATH) ×1 IMPLANT
GUIDEWIRE INQWIRE 1.5J.035X260 (WIRE) IMPLANT
INQWIRE 1.5J .035X260CM (WIRE) ×2
KIT ENCORE 26 ADVANTAGE (KITS) ×1 IMPLANT
KIT HEART LEFT (KITS) ×2 IMPLANT
KIT HEMO VALVE WATCHDOG (MISCELLANEOUS) ×1 IMPLANT
PACK CARDIAC CATHETERIZATION (CUSTOM PROCEDURE TRAY) ×2 IMPLANT
SHEATH PROBE COVER 6X72 (BAG) ×1 IMPLANT
STENT SYNERGY DES 2.5X16 (Permanent Stent) ×1 IMPLANT
TRANSDUCER W/STOPCOCK (MISCELLANEOUS) ×2 IMPLANT
TUBING CIL FLEX 10 FLL-RA (TUBING) ×2 IMPLANT
WIRE ASAHI PROWATER 180CM (WIRE) ×1 IMPLANT

## 2018-09-16 NOTE — Plan of Care (Signed)
  Problem: Education: Goal: Knowledge of General Education information will improve Description Including pain rating scale, medication(s)/side effects and non-pharmacologic comfort measures Outcome: Progressing   Problem: Health Behavior/Discharge Planning: Goal: Ability to manage health-related needs will improve Outcome: Progressing   Problem: Clinical Measurements: Goal: Ability to maintain clinical measurements within normal limits will improve Outcome: Progressing Goal: Will remain free from infection Outcome: Progressing Goal: Diagnostic test results will improve Outcome: Progressing Goal: Cardiovascular complication will be avoided Outcome: Progressing   Problem: Activity: Goal: Risk for activity intolerance will decrease Outcome: Progressing   Problem: Nutrition: Goal: Adequate nutrition will be maintained Outcome: Progressing   Problem: Coping: Goal: Level of anxiety will decrease Outcome: Progressing   Problem: Elimination: Goal: Will not experience complications related to bowel motility Outcome: Progressing Goal: Will not experience complications related to urinary retention Outcome: Progressing   Problem: Pain Managment: Goal: General experience of comfort will improve Outcome: Progressing   Problem: Safety: Goal: Ability to remain free from injury will improve Outcome: Progressing   Problem: Skin Integrity: Goal: Risk for impaired skin integrity will decrease Outcome: Progressing   Problem: Education: Goal: Ability to describe self-care measures that may prevent or decrease complications (Diabetes Survival Skills Education) will improve Outcome: Progressing Goal: Individualized Educational Video(s) Outcome: Progressing   Problem: Cardiac: Goal: Ability to maintain an adequate cardiac output will improve Outcome: Progressing   Problem: Health Behavior/Discharge Planning: Goal: Ability to identify and utilize available resources and services  will improve Outcome: Progressing Goal: Ability to manage health-related needs will improve Outcome: Progressing   Problem: Fluid Volume: Goal: Ability to achieve a balanced intake and output will improve Outcome: Progressing   Problem: Metabolic: Goal: Ability to maintain appropriate glucose levels will improve Outcome: Progressing   Problem: Education: Goal: Understanding of CV disease, CV risk reduction, and recovery process will improve Outcome: Progressing Goal: Individualized Educational Video(s) Outcome: Progressing   Problem: Activity: Goal: Ability to return to baseline activity level will improve Outcome: Progressing   Problem: Cardiovascular: Goal: Ability to achieve and maintain adequate cardiovascular perfusion will improve Outcome: Progressing Goal: Vascular access site(s) Level 0-1 will be maintained Outcome: Progressing   Problem: Health Behavior/Discharge Planning: Goal: Ability to safely manage health-related needs after discharge will improve Outcome: Progressing

## 2018-09-16 NOTE — Progress Notes (Signed)
Patient Demographics:    Danielle Flores, is a 53 y.o. female, DOB - 03/29/1966, JEH:[id number]  Admit date - 09/13/2018   Admitting Physician Eduard Clos, MD  Outpatient Primary MD for the patient is Zola Button, Grayling Congress, DO  LOS - 3   Chief Complaint  Patient presents with  . Aphasia  . 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  Prox Cx to Mid Cx lesion is 25% stenosed.  Ost LAD lesion is 25% stenosed.  RPDA lesion is 95% stenosed.  A drug-eluting stent was successfully placed using a STENT SYNERGY DES 2.5X16.  Post intervention, there is a 0% residual stenosis.  The left ventricular ejection fraction is greater than 65% by visual estimate.  There is hyperdynamic left ventricular systolic function.  LV end diastolic pressure is normal. LVEDP 7 mm Hg.  There is no aortic valve stenosis.    Brief summary 53 year old female with past medical history relevant for HTN and previously controlled diabetes admitted on 09/13/18 with DKA and elevated troponins, had  LHC on 09/16/2018 with stent placement, started on DAPT   Plan:- 1)CAD/NSTEMI----LHC on 09/16/2018 with RPDA lesion is 95% stenosed, status post angioplasty and stent placement, DAPT advised , patient remains chest pain-free, troponin peaked at 4.3, now down to 0.44 , EKG LVH with inferior/lateral ST depressoin - echo with LVEF 60-65%, severe septal hypertophy with moderate concentric LVH. Grade  II diastolic dysfunction. Mild cavitary gradient peak gradient 19, continue metoprolol 150 mg twice daily, isosorbide 30 mg daily, Lipitor, treated with IV heparin, stop heparin at this time .   cardiology consult and input appreciated  2)DKA----A1c 15, on admission blood glucose was 928, bicarb is 10 and anion gap was 28, DKA has resolved at this time... Off IV insulin drip, continue NPH insulin 15 units twice daily, along with sliding scale coverage, continue mealtime coverage, given  CAD patient will need aggressive diabetic control  3)AKI----acute kidney injury suspect due to significant dehydration in the setting of DKA , acute kidney injury appears to have resolved with hydration,   creatinine on admission= 2.1 ,   baseline creatinine =  0.8  , creatinine is now= 1.1     , renally adjust medications, avoid nephrotoxic agents/dehydration/hypotension  4)chronic hypochromic and microcytic anemia----baseline hemoglobin usually between 8 and 9,, hemoglobin up to 15 at this time due to significant dehydration---  hemoglobin is down to 13.0 post hydration  Disposition/Need for in-Hospital Stay- patient unable to be discharged at this time due to NSTEMI , had LHC on 09/16/2018 with angioplasty and stent placement  Code Status : Full   Family Communication:   S/o at bedside  Disposition Plan  : home   Consults  :  cardiology  DVT Prophylaxis  :  subcu heparin Lab Results  Component Value Date   PLT 131 (L) 09/16/2018    Inpatient Medications  Scheduled Meds: . aspirin EC  81 mg Oral Daily  . atorvastatin  80 mg Oral q1800  . insulin aspart  0-15 Units Subcutaneous TID WC  . insulin aspart  0-5 Units Subcutaneous QHS  . insulin aspart  4 Units Subcutaneous TID WC  . insulin NPH Human  8 Units Subcutaneous BID AC & HS  . isosorbide mononitrate  30 mg Oral Daily  . metoprolol tartrate  150 mg Oral BID  . potassium chloride  40 mEq Oral Once  . sodium chloride flush  3 mL Intravenous Q12H   . ticagrelor  90 mg Oral BID   Continuous Infusions:  PRN Meds:.hydrALAZINE, labetalol, ondansetron (ZOFRAN) IV, sodium chloride flush    Anti-infectives (From admission, onward)   None        Objective:   Vitals:   09/16/18 1139 09/16/18 1229 09/16/18 1354 09/16/18 1400  BP: (!) 157/63  (!) 165/96 (!) 154/129  Pulse: 72  74 73  Resp: 17   19  Temp: 98.7 F (37.1 C)     TempSrc: Oral     SpO2: 100% 98%    Weight:      Height:        Wt Readings from Last 3 Encounters:  09/13/18 85.7 kg  08/06/17 84.7 kg  07/30/17 82.1 kg     Intake/Output Summary (Last 24 hours) at 09/16/2018 1616 Last data filed at 09/16/2018 1200 Gross per 24 hour  Intake 842.91 ml  Output -  Net 842.91 ml     Physical Exam Patient is examined daily including today on 09/16/18 , exams remain the same as of yesterday except that has changed   Gen:- Awake Alert,  In no apparent distress  HEENT:- [place].AT, No sclera icterus Neck-Supple Neck,No JVD,.  Lungs-  CTAB , fair symmetrical air movement CV- S1, S2 normal, regular  Abd-  +ve B.Sounds, Abd Soft, No tenderness,    Extremity/Skin:- No  edema, pedal pulses present  Psych-affect is appropriate, oriented x3 Neuro-no new focal deficits, no tremors   Data Review:   Micro Results Recent Results (from the past 240 hour(s))  MRSA PCR Screening     Status: None   Collection Time: 09/13/18  7:00 AM  Result Value Ref Range Status   MRSA by PCR NEGATIVE NEGATIVE Final    Comment:        The GeneXpert MRSA Assay (FDA approved for NASAL specimens only), is one component of a comprehensive MRSA colonization surveillance program. It is not intended to diagnose MRSA infection nor to guide or monitor treatment for MRSA infections. Performed at [facility], [address] [place], [place] [address]     Radiology Reports Ct Head Wo Contrast  Result Date: 09/13/2018 CLINICAL DATA:  Slurred speech EXAM: CT HEAD WITHOUT CONTRAST  TECHNIQUE: Contiguous axial images were obtained from the base of the skull through the vertex without intravenous contrast. COMPARISON:  None. FINDINGS: Brain: No evidence of acute infarction, hemorrhage, extra-axial collection, ventriculomegaly, or mass effect. Old right basal ganglia lacunar infarct. Generalized cerebral atrophy. Periventricular white matter low attenuation likely secondary to microangiopathy. Vascular: No hyperdense vessel. No significant intracranial atherosclerotic disease. Skull: Negative for fracture or focal lesion. Sinuses/Orbits: Visualized portions of the orbits are unremarkable. Visualized portions of the paranasal sinuses and mastoid air cells are unremarkable. Other: None. IMPRESSION: No  acute intracranial pathology. Electronically Signed   By: Elige KoHetal  Patel   On: 09/13/2018 01:36   Dg Chest Port 1 View  Result Date: 09/13/2018 CLINICAL DATA:  Slurred speech, hand cramping for 48 hours EXAM: PORTABLE CHEST 1 VIEW COMPARISON:  None. FINDINGS: The heart size and mediastinal contours are within normal limits. Both lungs are clear. The visualized skeletal structures are unremarkable. IMPRESSION: No active disease. Electronically Signed   By: Elige KoHetal  Patel   On: 09/13/2018 01:34     CBC Recent Labs  Lab 09/13/18 [id number] 09/13/18 0123 09/13/18 0426 09/14/18 0042 09/15/18 0228 09/16/18 0250  WBC 7.7  --  7.9 10.8* 6.7 5.8  HGB 15.5* 15.6* 15.0 13.0 11.7* 11.6*  HCT 49.1* 46.0 47.9* 39.4 34.1* 35.1*  PLT 300  --  270 212 159 131*  MCV 79.3*  --  78.0* 73.6* 73.3* 75.0*  MCH 25.0*  --  24.4* 24.3* 25.2* 24.8*  MCHC 31.6  --  31.3 33.0 34.3 33.0  RDW 16.6*  --  15.7* 14.3 14.3 14.5    Chemistries  Recent Labs  Lab 09/13/18 0039 09/13/18 0123 09/13/18 0343 09/13/18 1119 09/14/18 0030 09/16/18 0250  NA 142 144 147* 155* 149* 141  K 5.1 4.6 4.3 3.7 3.1* 3.1*  CL 104  --  114* 125* 119* 107  CO2 10*  --  12* 18* 22 21*  GLUCOSE 928*  --  695* 86 107* 155*  BUN 47*   --  45* 32* 24* 9  CREATININE 2.15*  --  1.92* 1.31* 1.17* 0.75  CALCIUM 10.3  --  9.6 9.5 9.4 8.6*  MG  --   --  3.0*  --   --   --   AST 22  --   --   --   --   --   ALT 19  --   --   --   --   --   ALKPHOS 88  --   --   --   --   --   BILITOT 1.9*  --   --   --   --   --    ------------------------------------------------------------------------------------------------------------------ No results for input(s): CHOL, HDL, LDLCALC, TRIG, CHOLHDL, LDLDIRECT in the last 72 hours.  Lab Results  Component Value Date   HGBA1C 15.0 (H) 09/13/2018   ------------------------------------------------------------------------------------------------------------------ No results for input(s): TSH, T4TOTAL, T3FREE, THYROIDAB in the last 72 hours.  Invalid input(s): FREET3 ------------------------------------------------------------------------------------------------------------------ No results for input(s): VITAMINB12, FOLATE, FERRITIN, TIBC, IRON, RETICCTPCT in the last 72 hours.  Coagulation profile No results for input(s): INR, PROTIME in the last 168 hours.  No results for input(s): DDIMER in the last 72 hours.  Cardiac Enzymes Recent Labs  Lab 09/14/18 1109 09/14/18 1608 09/14/18 2257  TROPONINI 0.44* 0.41* 0.41*   ------------------------------------------------------------------------------------------------------------------ No results found for: BNP   Shon Haleourage Deshanae Lindo M.D on 09/16/2018 at 4:16 PM  Go to [email or web address] - for contact info  [facility] - Office  [phone]

## 2018-09-16 NOTE — Interval H&P Note (Signed)
Cath Lab Visit (complete for each Cath Lab visit)  Clinical Evaluation Leading to the Procedure:   ACS: Yes.    Non-ACS:    Anginal Classification: CCS IV  Anti-ischemic medical therapy: Minimal Therapy (1 class of medications)  Non-Invasive Test Results: No non-invasive testing performed  Prior CABG: No previous CABG      History and Physical Interval Note:  09/16/2018 12:38 PM  Danielle Flores  has presented today for surgery, with the diagnosis of NSTEMI  The various methods of treatment have been discussed with the patient and family. After consideration of risks, benefits and other options for treatment, the patient has consented to  Procedure(s): LEFT HEART CATH AND CORONARY ANGIOGRAPHY (N/A) as a surgical intervention .  The patient's history has been reviewed, patient examined, no change in status, stable for surgery.  I have reviewed the patient's chart and labs.  Questions were answered to the patient's satisfaction.     Lance Muss

## 2018-09-16 NOTE — Progress Notes (Signed)
ANTICOAGULATION CONSULT NOTE - Follow-Up Consult  Pharmacy Consult for Heparin Indication: chest pain/ACS  No Known Allergies  Patient Measurements: Height: 5\' 6"  (167.6 cm) Weight: 189 lb (85.7 kg) IBW/kg (Calculated) : 59.3 Heparin Dosing Weight: 67 kg  Vital Signs: Temp: 98.6 F (37 C) (02/10 0749) Temp Source: Oral (02/10 0749) BP: 162/79 (02/10 0749) Pulse Rate: 66 (02/10 0749)  Labs: Recent Labs    09/13/18 1119  09/14/18 0030  09/14/18 0042 09/14/18 1109 09/14/18 1608 09/14/18 2257 09/15/18 0228 09/16/18 0250  HGB  --   --   --    < > 13.0  --   --   --  11.7* 11.6*  HCT  --   --   --   --  39.4  --   --   --  34.1* 35.1*  PLT  --   --   --   --  212  --   --   --  159 131*  HEPARINUNFRC  --    < > 0.49  --   --   --   --   --  0.45 0.43  CREATININE 1.31*  --  1.17*  --   --   --   --   --   --  0.75  TROPONINI  --   --   --   --   --  0.44* 0.41* 0.41*  --   --    < > = values in this interval not displayed.    Estimated Creatinine Clearance: 87.7 mL/min (by C-G formula based on SCr of 0.75 mg/dL).  Medications:  Infusions:  . sodium chloride    . sodium chloride 1 mL/kg/hr (09/16/18 0500)  . heparin 800 Units/hr (09/15/18 0600)    Assessment: 35 yoF admitted with DKA and positive troponins, started on IV heparin. Heparin level therapeutic, H/H stable, pltc trending down but calculated 4Ts score = 3 so doubt HIT. Cardiology planning cath today.  Goal of Therapy:  Heparin level 0.3-0.7 units/ml Monitor platelets by anticoagulation protocol: Yes   Plan:  -Continue heparin drip at 800 units/hr -Daily heparin level and CBC -Watch Platelet count carefully -Cath today  Fredonia Highland, PharmD, BCPS Clinical Pharmacist [phone] Please check AMION for all [facility] Pharmacy numbers 09/16/2018

## 2018-09-16 NOTE — H&P (View-Only) (Signed)
Progress Note  Patient Name: Danielle Flores Date of Encounter: 09/16/2018  Primary Cardiologist: No primary care provider on file.   Subjective   Patient feeling well today. No shortness of breath, chest pain, nausea, vomiting. Understand she will be going for left heart cath today. Understands the possible outcomes. All questions and concerns addressed.  Inpatient Medications    Scheduled Meds: . aspirin EC  81 mg Oral Daily  . atorvastatin  80 mg Oral q1800  . heparin  4,000 Units Intravenous Once  . insulin aspart  0-15 Units Subcutaneous TID WC  . insulin aspart  0-5 Units Subcutaneous QHS  . insulin aspart  4 Units Subcutaneous TID WC  . insulin NPH Human  8 Units Subcutaneous BID AC & HS  . isosorbide mononitrate  30 mg Oral Daily  . metoprolol tartrate  150 mg Oral BID  . sodium chloride flush  3 mL Intravenous Q12H   Continuous Infusions: . sodium chloride    . sodium chloride 1 mL/kg/hr (09/16/18 0500)  . heparin 800 Units/hr (09/15/18 0600)   PRN Meds: sodium chloride, hydrALAZINE, ondansetron (ZOFRAN) IV, sodium chloride flush   Vital Signs    Vitals:   09/15/18 1942 09/15/18 2349 09/16/18 0415 09/16/18 0749  BP: (!) 164/86 (!) 152/92 140/69 (!) 162/79  Pulse: 88 64 64 66  Resp: 17 16 15 13   Temp: 98.5 F (36.9 C) 98.5 F (36.9 C) 98.5 F (36.9 C) 98.6 F (37 C)  TempSrc: Oral Oral Oral Oral  SpO2: 100% 100% 100% 98%  Weight:      Height:        Intake/Output Summary (Last 24 hours) at 09/16/2018 1018 Last data filed at 09/16/2018 0600 Gross per 24 hour  Intake 192.01 ml  Output -  Net 192.01 ml   Filed Weights   09/13/18 0041  Weight: 85.7 kg   Telemetry    NSR - Personally Reviewed  ECG    No new EKG - Personally Reviewed  Physical Exam   Today's Vitals   09/15/18 1942 09/15/18 2349 09/16/18 0415 09/16/18 0749  BP: (!) 164/86 (!) 152/92 140/69 (!) 162/79  Pulse: 88 64 64 66  Resp: 17 16 15 13   Temp: 98.5 F (36.9 C) 98.5 F  (36.9 C) 98.5 F (36.9 C) 98.6 F (37 C)  TempSrc: Oral Oral Oral Oral  SpO2: 100% 100% 100% 98%  Weight:      Height:      PainSc: 0-No pain 0-No pain 0-No pain    Body mass index is 30.51 kg/m.  GEN: No acute distress.   Neck: No JVD Cardiac: RRR, no murmurs, rubs, or gallops.  Respiratory: Clear to auscultation bilaterally. GI: Soft, nontender, non-distended  MS: No edema; No deformity. Neuro:  Nonfocal  Psych: Normal affect   Labs    Chemistry Recent Labs  Lab 09/13/18 0039  09/13/18 1119 09/14/18 0030 09/16/18 0250  NA 142   < > 155* 149* 141  K 5.1   < > 3.7 3.1* 3.1*  CL 104   < > 125* 119* 107  CO2 10*   < > 18* 22 21*  GLUCOSE 928*   < > 86 107* 155*  BUN 47*   < > 32* 24* 9  CREATININE 2.15*   < > 1.31* 1.17* 0.75  CALCIUM 10.3   < > 9.5 9.4 8.6*  PROT 9.2*  --   --   --   --   ALBUMIN 4.3  --   --   --   --  AST 22  --   --   --   --   ALT 19  --   --   --   --   ALKPHOS 88  --   --   --   --   BILITOT 1.9*  --   --   --   --   GFRNONAA 25*   < > 46* 52* >60  GFRAA 29*   < > 53* >60 >60  ANIONGAP 28*   < > 12 8 13   < > = values in this interval not displayed.    Hematology Recent Labs  Lab 09/14/18 0042 09/15/18 0228 09/16/18 0250  WBC 10.8* 6.7 5.8  RBC 5.35* 4.65 4.68  HGB 13.0 11.7* 11.6*  HCT 39.4 34.1* 35.1*  MCV 73.6* 73.3* 75.0*  MCH 24.3* 25.2* 24.8*  MCHC 33.0 34.3 33.0  RDW 14.3 14.3 14.5  PLT 212 159 131*   Cardiac Enzymes Recent Labs  Lab 09/13/18 0343 09/14/18 1109 09/14/18 1608 09/14/18 2257  TROPONINI 4.31* 0.44* 0.41* 0.41*    Recent Labs  Lab 09/13/18 0046  TROPIPOC 2.80*     BNPNo results for input(s): BNP, PROBNP in the last 168 hours.   DDimer No results for input(s): DDIMER in the last 168 hours.   Radiology    No results found.  Cardiac Studies   Echocardiogram 09/13/2018  1. The left ventricle has normal systolic function of 60-65%. The cavity size was normal. There is severe septal  hypertrophy with otherwise moderate concentric LVH. Echo evidence of pseudonormalization in diastolic relaxation Elevated left ventricular  end-diastolic pressure.  2. 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 There is a mid cavitary gradient with a peak velocity 2.18 m/s and peak gradient 19 mmHg.  3. The right ventricle has normal systolic function. The cavity was normal. There is no increase in right ventricular wall thickness.  4. The mitral valve is normal in structure.  5. The tricuspid valve is normal in structure.  6. The aortic valve is normal in structure.  7. The pulmonic valve was normal in structure.  8. Grade 2 diastolic dysfunction.  Patient Profile     Danielle Floresis a 53 y.o.femalewith a hx of HTN, HLD and uncontrolled DM IIwho is being seen today for the evaluation of elevated troponin in the setting of DKAat the request of Dr. Emokpae.  Assessment & Plan    Elevated Troponin  - No chest pain or SOB - Troponin peak at 4.3 now down to 0.41  - Continue ASA 81 mg, Atorvastatin 80 mg QD, and Metoprolol tartrate 150 mg BID  - Continue heparin drip - Plan for left heart cath today, further recs pending results   For questions or updates, please contact [facility] Please consult [email or web address] for contact info under Cardiology/STEMI.     Signed, Justin Helberg, MD  09/16/2018, 10:18 AM     I have examined the patient and reviewed assessment and plan and discussed with patient.  Agree with above as stated.  High risk for CAD given her uncontrolled diabetes.  Plan for cath today.  All questions answered.  Will attempt right radial approach.  She also has septal hypertrophy with mild resting gradient.  Acute renal failure has resolved.  Will avoid excessive contrast use.  Danielle Flores  

## 2018-09-16 NOTE — Progress Notes (Addendum)
Progress Note  Patient Name: Danielle Flores Date of Encounter: 09/16/2018  Primary Cardiologist: No primary care provider on file.   Subjective   Patient feeling well today. No shortness of breath, chest pain, nausea, vomiting. Understand she will be going for left heart cath today. Understands the possible outcomes. All questions and concerns addressed.  Inpatient Medications    Scheduled Meds: . aspirin EC  81 mg Oral Daily  . atorvastatin  80 mg Oral q1800  . heparin  4,000 Units Intravenous Once  . insulin aspart  0-15 Units Subcutaneous TID WC  . insulin aspart  0-5 Units Subcutaneous QHS  . insulin aspart  4 Units Subcutaneous TID WC  . insulin NPH Human  8 Units Subcutaneous BID AC & HS  . isosorbide mononitrate  30 mg Oral Daily  . metoprolol tartrate  150 mg Oral BID  . sodium chloride flush  3 mL Intravenous Q12H   Continuous Infusions: . sodium chloride    . sodium chloride 1 mL/kg/hr (09/16/18 0500)  . heparin 800 Units/hr (09/15/18 0600)   PRN Meds: sodium chloride, hydrALAZINE, ondansetron (ZOFRAN) IV, sodium chloride flush   Vital Signs    Vitals:   09/15/18 1942 09/15/18 2349 09/16/18 0415 09/16/18 0749  BP: (!) 164/86 (!) 152/92 140/69 (!) 162/79  Pulse: 88 64 64 66  Resp: 17 16 15 13   Temp: 98.5 F (36.9 C) 98.5 F (36.9 C) 98.5 F (36.9 C) 98.6 F (37 C)  TempSrc: Oral Oral Oral Oral  SpO2: 100% 100% 100% 98%  Weight:      Height:        Intake/Output Summary (Last 24 hours) at 09/16/2018 1018 Last data filed at 09/16/2018 0600 Gross per 24 hour  Intake 192.01 ml  Output -  Net 192.01 ml   Filed Weights   09/13/18 0041  Weight: 85.7 kg   Telemetry    NSR - Personally Reviewed  ECG    No new EKG - Personally Reviewed  Physical Exam   Today's Vitals   09/15/18 1942 09/15/18 2349 09/16/18 0415 09/16/18 0749  BP: (!) 164/86 (!) 152/92 140/69 (!) 162/79  Pulse: 88 64 64 66  Resp: 17 16 15 13   Temp: 98.5 F (36.9 C) 98.5 F  (36.9 C) 98.5 F (36.9 C) 98.6 F (37 C)  TempSrc: Oral Oral Oral Oral  SpO2: 100% 100% 100% 98%  Weight:      Height:      PainSc: 0-No pain 0-No pain 0-No pain    Body mass index is 30.51 kg/m.  GEN: No acute distress.   Neck: No JVD Cardiac: RRR, no murmurs, rubs, or gallops.  Respiratory: Clear to auscultation bilaterally. GI: Soft, nontender, non-distended  MS: No edema; No deformity. Neuro:  Nonfocal  Psych: Normal affect   Labs    Chemistry Recent Labs  Lab 09/13/18 0039  09/13/18 1119 09/14/18 0030 09/16/18 0250  NA 142   < > 155* 149* 141  K 5.1   < > 3.7 3.1* 3.1*  CL 104   < > 125* 119* 107  CO2 10*   < > 18* 22 21*  GLUCOSE 928*   < > 86 107* 155*  BUN 47*   < > 32* 24* 9  CREATININE 2.15*   < > 1.31* 1.17* 0.75  CALCIUM 10.3   < > 9.5 9.4 8.6*  PROT 9.2*  --   --   --   --   ALBUMIN 4.3  --   --   --   --  AST 22  --   --   --   --   ALT 19  --   --   --   --   ALKPHOS 88  --   --   --   --   BILITOT 1.9*  --   --   --   --   GFRNONAA 25*   < > 46* 52* >60  GFRAA 29*   < > 53* >60 >60  ANIONGAP 28*   < > 12 8 13    < > = values in this interval not displayed.    Hematology Recent Labs  Lab 09/14/18 0042 09/15/18 0228 09/16/18 0250  WBC 10.8* 6.7 5.8  RBC 5.35* 4.65 4.68  HGB 13.0 11.7* 11.6*  HCT 39.4 34.1* 35.1*  MCV 73.6* 73.3* 75.0*  MCH 24.3* 25.2* 24.8*  MCHC 33.0 34.3 33.0  RDW 14.3 14.3 14.5  PLT 212 159 131*   Cardiac Enzymes Recent Labs  Lab 09/13/18 0343 09/14/18 1109 09/14/18 1608 09/14/18 2257  TROPONINI 4.31* 0.44* 0.41* 0.41*    Recent Labs  Lab 09/13/18 0046  TROPIPOC 2.80*     BNPNo results for input(s): BNP, PROBNP in the last 168 hours.   DDimer No results for input(s): DDIMER in the last 168 hours.   Radiology    No results found.  Cardiac Studies   Echocardiogram 09/13/2018  1. The left ventricle has normal systolic function of 60-65%. The cavity size was normal. There is severe septal  hypertrophy with otherwise moderate concentric LVH. Echo evidence of pseudonormalization in diastolic relaxation Elevated left ventricular  end-diastolic pressure.  2. 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 There is a mid cavitary gradient with a peak velocity 2.18 m/s and peak gradient 19 mmHg.  3. The right ventricle has normal systolic function. The cavity was normal. There is no increase in right ventricular wall thickness.  4. The mitral valve is normal in structure.  5. The tricuspid valve is normal in structure.  6. The aortic valve is normal in structure.  7. The pulmonic valve was normal in structure.  8. Grade 2 diastolic dysfunction.  Patient Profile     Danielle Flores a 53 y.o.femalewith a hx of HTN, HLD and uncontrolled DM IIwho is being seen today for the evaluation of elevated troponin in the setting of DKAat the request of Dr. Mariea Clonts.  Assessment & Plan    Elevated Troponin  - No chest pain or SOB - Troponin peak at 4.3 now down to 0.41  - Continue ASA 81 mg, Atorvastatin 80 mg QD, and Metoprolol tartrate 150 mg BID  - Continue heparin drip - Plan for left heart cath today, further recs pending results   For questions or updates, please contact [facility] Please consult [email or web address] for contact info under Cardiology/STEMI.     Signed, Levora Dredge, MD  09/16/2018, 10:18 AM     I have examined the patient and reviewed assessment and plan and discussed with patient.  Agree with above as stated.  High risk for CAD given her uncontrolled diabetes.  Plan for cath today.  All questions answered.  Will attempt right radial approach.  She also has septal hypertrophy with mild resting gradient.  Acute renal failure has resolved.  Will avoid excessive contrast use.  Lance Muss

## 2018-09-16 NOTE — Progress Notes (Signed)
Pt came back from Cath lab with TR band 13 ml air. Deflate 3 ml with no bleeding at 1545. Pt c/o numbness on Rt. Hand. Reduced 3 ml more. Pt felt okay. Pt's arm was swollen can see light blue color. Pt c/o tenderness above the TR ban forearm area. Called cath lab nurses for regarding assess hematoma. Cath lab nurse came to check and started apply manual blood pressure cuff at hematoma site and inflate a 15 mm Hg below patient's systolic blood pressure for 15 minutes. Deflated cuff at 1745, then every 3-5 mm Hg every 2-3 minutes. Above manual BP cuff is soft and non tenderness. Continue to monitor pt's arm. HS [employer]

## 2018-09-16 NOTE — Plan of Care (Signed)
Nutrition Education Note  RD consulted for nutrition education regarding diabetes. Spoke with pt and significant other at bedside. Pt states that all of this information about diabetes is new for her but that she already knows some about the relationship between carbohydrates and blood sugar.  Pt shares that she has always eaten "healthy" and typically eats 3 meals daily.  Breakfast: cereal with milk or egg and toast Lunch: salad with chicken or sandwich Dinner: salmon and sides  Pt typically drinks water and juice throughout the day.  Lab Results  Component Value Date   HGBA1C 15.0 (H) 09/13/2018    RD provided "Carbohydrate Counting for People with Diabetes" handout from the Academy of Nutrition and Dietetics. Discussed different food groups and their effects on blood sugar, emphasizing carbohydrate-containing foods. Provided list of carbohydrates and recommended serving sizes of common foods.  Discussed importance of controlled and consistent carbohydrate intake throughout the day. Encouraged pt to consume 1-2 servings of carbohydrates at snacks and 3-4 servings of carbohydrates at meals. Provided examples of ways to balance meals/snacks and encouraged intake of high-fiber, whole grain complex carbohydrates. Teach back method used.  Expect good compliance.  Body mass index is 30.51 kg/m. Pt meets criteria for obesity class I based on current BMI.  Current diet order is NPO pending cardiac cath. Prior to NPO, pt consuming ~70% of meals. Labs and medications reviewed. No further nutrition interventions warranted at this time. RD contact information provided. If additional nutrition issues arise, please re-consult RD.   Earma Reading, MS, RD, LDN Inpatient Clinical Dietitian Pager: [phone] Weekend/After Hours: [phone]

## 2018-09-17 LAB — BASIC METABOLIC PANEL
Anion gap: 9 (ref 5–15)
BUN: 8 mg/dL (ref 6–20)
CALCIUM: 8.6 mg/dL — AB (ref 8.9–10.3)
CO2: 23 mmol/L (ref 22–32)
CREATININE: 0.84 mg/dL (ref 0.44–1.00)
Chloride: 107 mmol/L (ref 98–111)
GFR calc Af Amer: >60 mL/min (ref >60)
GFR calc non Af Amer: >60 mL/min (ref >60)
Glucose, Bld: 227 mg/dL — ABNORMAL HIGH (ref 70–99)
Potassium: 3.7 mmol/L (ref 3.5–5.1)
Sodium: 139 mmol/L (ref 135–145)

## 2018-09-17 LAB — CBC
HCT: 31.1 % — ABNORMAL LOW (ref 36.0–46.0)
Hemoglobin: 10.5 g/dL — ABNORMAL LOW (ref 12.0–15.0)
MCH: 24.9 pg — AB (ref 26.0–34.0)
MCHC: 33.8 g/dL (ref 30.0–36.0)
MCV: 73.7 fL — ABNORMAL LOW (ref 80.0–100.0)
Platelets: 130 10*3/uL — ABNORMAL LOW (ref 150–400)
RBC: 4.22 MIL/uL (ref 3.87–5.11)
RDW: 14.1 % (ref 11.5–15.5)
WBC: 5.8 10*3/uL (ref 4.0–10.5)
nRBC: 0 % (ref 0.0–0.2)

## 2018-09-17 LAB — GLUCOSE, CAPILLARY
Glucose-Capillary: 245 mg/dL — ABNORMAL HIGH (ref 70–99)
Glucose-Capillary: 259 mg/dL — ABNORMAL HIGH (ref 70–99)
Glucose-Capillary: 131 mg/dL — ABNORMAL HIGH (ref 70–99)

## 2018-09-17 MED ORDER — LISINOPRIL-HYDROCHLOROTHIAZIDE 10-12.5 MG PO TABS: 1.0000 | ORAL_TABLET | Freq: Every day | ORAL | 11 refills | Status: DC

## 2018-09-17 MED ORDER — BLOOD GLUCOSE METER KIT: PACK | 11 refills | Status: AC

## 2018-09-17 MED ORDER — TICAGRELOR 90 MG PO TABS: 90.0000 mg | ORAL_TABLET | Freq: Two times a day (BID) | ORAL | 11 refills | Status: DC

## 2018-09-17 MED ORDER — LISINOPRIL 10 MG PO TABS
10.0000 mg | ORAL_TABLET | Freq: Once | ORAL | Status: AC
  Administered 2018-09-17: 10 mg via ORAL
  Filled 2018-09-17: qty 1

## 2018-09-17 MED ORDER — METOPROLOL TARTRATE 75 MG PO TABS: 150.0000 mg | ORAL_TABLET | Freq: Two times a day (BID) | ORAL | 11 refills | Status: DC

## 2018-09-17 MED ORDER — INSULIN REGULAR HUMAN 100 UNIT/ML IJ SOLN: INTRAMUSCULAR | 3 refills | Status: DC

## 2018-09-17 MED ORDER — INSULIN NPH ISOPHANE & REGULAR (70-30) 100 UNIT/ML ~~LOC~~ SUSP: 22.0000 [IU] | Freq: Two times a day (BID) | SUBCUTANEOUS | 11 refills | Status: DC

## 2018-09-17 MED ORDER — INSULIN NPH ISOPHANE & REGULAR (70-30) 100 UNIT/ML ~~LOC~~ SUSP: 18.0000 [IU] | Freq: Two times a day (BID) | SUBCUTANEOUS | 11 refills | Status: DC

## 2018-09-17 MED ORDER — ATORVASTATIN CALCIUM 80 MG PO TABS: 80.0000 mg | ORAL_TABLET | Freq: Every evening | ORAL | 11 refills | Status: DC

## 2018-09-17 MED ORDER — ISOSORBIDE MONONITRATE ER 30 MG PO TB24: 30.0000 mg | ORAL_TABLET | Freq: Every day | ORAL | 11 refills | Status: DC

## 2018-09-17 MED ORDER — ASPIRIN 81 MG PO TBEC: 81.0000 mg | DELAYED_RELEASE_TABLET | Freq: Every day | ORAL | 11 refills | Status: DC

## 2018-09-17 MED ORDER — METFORMIN HCL 1000 MG PO TABS: 1000.0000 mg | ORAL_TABLET | Freq: Two times a day (BID) | ORAL | 11 refills | Status: DC

## 2018-09-17 MED FILL — Tirofiban HCl in NaCl 0.9% IV Soln 5 MG/100ML (Base Equiv): INTRAVENOUS | Qty: 100 | Status: AC

## 2018-09-17 MED FILL — METOPROLOL TARTRATE 100 MG: 100 | 30 days supply | Qty: 90 | Fill #0

## 2018-09-17 MED FILL — BRILINTA 90 MG TABLET: 90 | 30 days supply | Qty: 60 | Fill #0

## 2018-09-17 MED FILL — NOVOLIN 70/30 100 UNITS/ML: (70-30) 100 | 27 days supply | Qty: 10 | Fill #0

## 2018-09-17 MED FILL — ATORVASTATIN CALCIUM 80 MG: 80 | 30 days supply | Qty: 30 | Fill #0

## 2018-09-17 MED FILL — ISOSORBIDE MN ER 30 MG TAB: 30 | 30 days supply | Qty: 30 | Fill #0

## 2018-09-17 MED FILL — ULTICARE INS 0.3 ML 30GX1/2: 30G X 1/2" | 30 days supply | Qty: 100 | Fill #0

## 2018-09-17 MED FILL — NovoLIN R 100 UNIT/ML SOLN: 100 | 22 days supply | Qty: 10 | Fill #0

## 2018-09-17 MED FILL — LISINOPRIL-HCTZ 10-12.5 MG: 10-12.5 | 30 days supply | Qty: 30 | Fill #0

## 2018-09-17 MED FILL — metFORMIN HCL 1000 MG TABS: 1000 | 30 days supply | Qty: 60 | Fill #0

## 2018-09-17 MED FILL — ASPIRIN LOW DOSE 81 MG TBEC: 81 | 30 days supply | Qty: 30 | Fill #0

## 2018-09-17 NOTE — Discharge Summary (Signed)
Danielle Flores, is a 53 y.o. female  DOB 08-13-1965  MRN [id number].  Admission date:  09/13/2018  Admitting Physician  Rise Patience, MD  Discharge Date:  09/17/2018   Primary MD  Ann Held, DO  Recommendations for primary care physician for things to follow:   1)Very low-salt diet advised 2)Weigh yourself daily, call if you gain more than 3 pounds in 1 day or more than 5 pounds in 1 week as your diuretic medications may need to be adjusted 3)you are taking Brilinta and aspirin which are both blood thinners so Avoid ibuprofen/Advil/Aleve/Motrin/Goody Powders/Naproxen/BC powders/Meloxicam/Diclofenac/Indomethacin and other Nonsteroidal anti-inflammatory medications as these will make you more likely to bleed and can cause stomach ulcers, can also cause Kidney problems.  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 Hyperglycemia   . Hyperlipidemia   . Hypertension     Past Surgical History:  Procedure Laterality Date  . CESAREAN SECTION    . CORONARY STENT INTERVENTION N/A 09/16/2018   Procedure: CORONARY STENT  INTERVENTION;  Surgeon: Jettie Booze, MD;  Location: [facility];  Service: Cardiovascular;  Laterality: N/A;  . LEFT HEART CATH AND CORONARY ANGIOGRAPHY N/A 09/16/2018   Procedure: LEFT HEART CATH AND CORONARY ANGIOGRAPHY;  Surgeon: Jettie Booze, MD;  Location: [facility];  Service: Cardiovascular;  Laterality: N/A;       HPI  from the history and physical done on the day of admission:    Chief Complaint: Muscle spasms and weakness.  HPI: Danielle Flores is a 53 y.o. female with history of hypertension and borderline diabetes mellitus takes only metoprolol at this time for blood pressure was brought to the ER after patient was getting increasingly weak with muscle spasm and slurred speech.  The symptoms have been ongoing for last 2 days.  Denies any fall headache visual symptoms or difficulty swallowing.  Muscle spasm mostly in the upper extremities.  Patient also has been feeling weak.  Denies chest pain or shortness of breath.  ED Course: In the ER patient's blood sugar was 928 with bicarb of 10 anion gap of 28 urine showing ketones.  EKG shows normal sinus rhythm.  Point-of-care troponin was elevated at 2.  Patient was started on fluid bolus for DKA and on  IV insulin infusion.  ER physician discussed with on-call cardiologist for elevated troponin.  He will be seeing patient in consult.  Patient denies any chest pain.     Hospital Course:     09/16/2018--- LHC   Prox RCA to Mid RCA lesion is 25% stenosed.  Prox Cx to Mid Cx lesion is 25% stenosed.  Ost LAD lesion is 25% stenosed.  RPDA lesion is 95% stenosed.  A drug-eluting stent was successfully placed using a STENT SYNERGY DES 2.5X16.  Post intervention, there is a 0% residual stenosis.  The left ventricular ejection fraction is greater than 65% by visual estimate.  There is hyperdynamic left ventricular systolic function.  LV end diastolic pressure is normal. LVEDP 7 mm Hg.  There is no  aortic valve stenosis.   Brief summary 53 year old female with past medical history relevant for HTN and previously controlled diabetes admitted on 09/13/18 with DKA and elevated troponins, had  LHC on 09/16/2018 with stent placement, started on DAPT   Plan:- 1)CAD/NSTEMI----LHC on 09/16/2018 with RPDA lesion is 95% stenosed, status post angioplasty and stent placement, DAPT advised , patient remains chest pain-free, troponin peaked at 4.3, now down to 0.44 , EKG LVH with inferior/lateral ST depressoin - echo with LVEF 60-65%, severe septal hypertophy with moderate concentric LVH. Grade II diastolic dysfunction. Mild cavitary gradient peak gradient 19, continue metoprolol 150 mg twice daily, isosorbide 30 mg daily, Lipitor, treated with IV heparin, , okay to discharge home on aspirin and Brilinta,   cardiology consult and input appreciated, patient developed right upper extremity hematoma after LHC, appears to be resolving, H&H is stable  2)DM2 with DKA----A1c 15, on admission blood glucose was 928, bicarb was 10 and anion gap was 28, DKA has resolved at this time... Off IV insulin drip, and declines sliding scale coverage due to complexity,,  also declines mealtime insulin coverage, given  CAD patient will need aggressive diabetic control, due to cost patient will be discharged on ReliOn brand insulin from [employer] 70/30 at 22 units twice daily with meals, okay to start metformin on 09/18/2018  3)AKI----acute kidney injurysuspect due to significant dehydration in the setting of DKA, acute kidney injury appears to have resolved with hydration,creatinine on admission= 2.1, baseline creatinine = 0.8, creatinine is now=  0.8, renally adjust medications, avoid nephrotoxic agents/dehydration/hypotension  4)chronic hypochromic and microcytic anemia----baseline hemoglobin usually between 8 and 9,,hemoglobin up to 15 at this time due to significant dehydration--- hemoglobin is down to 10.5  post hydration   Code Status : Full   Family Communication:   S/o at bedside  Disposition Plan  : home   Consults  :  cardiology  Discharge Condition: stable  Follow UP  Diet and Activity recommendation:  As advised  Discharge Instructions    Discharge Instructions    Amb Referral to Cardiac Rehabilitation   Complete by:  As directed    Diagnosis:   Coronary Stents NSTEMI     Call MD for:  difficulty breathing, headache or visual disturbances   Complete by:  As directed    Call MD for:  persistant dizziness or light-headedness   Complete by:  As directed    Call MD for:  persistant nausea and vomiting   Complete by:  As directed    Call MD for:  severe uncontrolled pain   Complete by:  As directed    Call MD for:  temperature >100.4   Complete by:  As directed    Diet - low sodium  heart healthy   Complete by:  As directed    Diet Carb Modified   Complete by:  As directed    Discharge instructions   Complete by:  As directed    1)Very low-salt diet advised 2)Weigh yourself daily, call if you gain more than 3 pounds in 1 day or more than 5 pounds in 1 week as your diuretic medications may need to be adjusted 3)you are taking Brilinta and aspirin which are both blood thinners so Avoid ibuprofen/Advil/Aleve/Motrin/Goody Powders/Naproxen/BC powders/Meloxicam/Diclofenac/Indomethacin and other Nonsteroidal anti-inflammatory medications as these will make you more likely to bleed and can cause stomach ulcers, can also cause Kidney problems.  4) follow-up with cardiologist as advised within the next 2 weeks 5) follow-up with primary care physician for adjustment of your insulin and diabetic regimen 6) please note that there is been multiple changes to your medications   Increase activity slowly   Complete by:  As directed         Discharge Medications     Allergies as of 09/17/2018   No Known Allergies     Medication List    STOP taking these medications     amLODipine 5 MG tablet Commonly known as:  NORVASC     TAKE these medications   aspirin 81 MG EC tablet Take 1 tablet (81 mg total) by mouth daily with breakfast.   atorvastatin 80 MG tablet Commonly known as:  LIPITOR Take 1 tablet (80 mg total) by mouth every evening.   blood glucose meter kit and supplies Relion Prime or Dispense other brand based on patient and insurance preference. Use up to four times daily as directed. (FOR ICD-9 250.00, 250.01).   blood glucose meter kit and supplies Kit Dispense based on patient and insurance preference. Use up to four times daily as directed. (FOR ICD-9 250.00, 250.01).   insulin NPH-regular Human (70-30) 100 UNIT/ML injection Commonly known as:  NOVOLIN 70/30 RELION Inject 22 Units into the skin 2 (two) times daily with a meal.   isosorbide mononitrate 30 MG 24 hr tablet Commonly known as:  IMDUR Take 1 tablet (30 mg total) by mouth daily.   lisinopril-hydrochlorothiazide 10-12.5 MG tablet Commonly known as:  ZESTORETIC Take 1 tablet by mouth daily.   metFORMIN 1000 MG tablet Commonly known as:  GLUCOPHAGE Take 1 tablet (1,000 mg total) by mouth 2 (two) times daily with a meal. What changed:    medication strength  See the new instructions.   Metoprolol Tartrate 75 MG Tabs Take 150 mg by mouth 2 (two) times daily. What changed:    medication strength  additional instructions   ticagrelor 90 MG Tabs tablet Commonly known as:  BRILINTA Take 1 tablet (90 mg total) by mouth 2 (two) times daily.       Major procedures and Radiology Reports - PLEASE review detailed and final reports for all details, in brief -   Ct Head Wo Contrast  Result Date: 09/13/2018 CLINICAL DATA:  Slurred speech EXAM: CT HEAD WITHOUT CONTRAST TECHNIQUE: Contiguous axial images were obtained from the base of the skull through the vertex without intravenous contrast. COMPARISON:  None. FINDINGS: Brain: No evidence of acute infarction, hemorrhage,  extra-axial collection, ventriculomegaly, or mass effect. Old right basal ganglia lacunar infarct. Generalized cerebral atrophy. Periventricular white matter low attenuation likely secondary to microangiopathy. Vascular: No hyperdense vessel. No significant intracranial atherosclerotic disease. Skull: Negative for fracture or focal lesion. Sinuses/Orbits: Visualized portions of the orbits are unremarkable. Visualized portions of the  paranasal sinuses and mastoid air cells are unremarkable. Other: None. IMPRESSION: No acute intracranial pathology. Electronically Signed   By: Kathreen Devoid   On: 09/13/2018 01:36   Dg Chest Port 1 View  Result Date: 09/13/2018 CLINICAL DATA:  Slurred speech, hand cramping for 48 hours EXAM: PORTABLE CHEST 1 VIEW COMPARISON:  None. FINDINGS: The heart size and mediastinal contours are within normal limits. Both lungs are clear. The visualized skeletal structures are unremarkable. IMPRESSION: No active disease. Electronically Signed   By: Kathreen Devoid   On: 09/13/2018 01:34    Micro Results   Recent Results (from the past 240 hour(s))  MRSA PCR Screening     Status: None   Collection Time: 09/13/18  7:00 AM  Result Value Ref Range Status   MRSA by PCR NEGATIVE NEGATIVE Final    Comment:        The GeneXpert MRSA Assay (FDA approved for NASAL specimens only), is one component of a comprehensive MRSA colonization surveillance program. It is not intended to diagnose MRSA infection nor to guide or monitor treatment for MRSA infections. Performed at [facility], [place] [address], [place], [place] [address]     Today   Subjective    Esmae Donathan today has no complaints, chest pains no palpitations no dizziness no shortness of breath, right upper extremity hematoma appears to be improving,   Patient has been seen and examined prior to discharge   Objective   Blood pressure 118/74, pulse 76, temperature 98.4 F (36.9 C), temperature source Oral, resp.  rate 16, height '5\' 6"'  (1.676 m), weight 85.7 kg, SpO2 99 %.   Intake/Output Summary (Last 24 hours) at 09/17/2018 1417 Last data filed at 09/17/2018 0817 Gross per 24 hour  Intake 603 ml  Output -  Net 603 ml    Exam Gen:- Awake Alert,  In no apparent distress  HEENT:- [place].AT, No sclera icterus Neck-Supple Neck,No JVD,.  Lungs-  CTAB , fair symmetrical air movement CV- S1, S2 normal, regular  Abd-  +ve B.Sounds, Abd Soft, No tenderness,    Extremity/Skin:-  , pedal pulses present , RUE post LHC hematoma appears to be resolving Psych-affect is appropriate, oriented x3 Neuro-no new focal deficits, no tremors   Data Review   CBC w Diff:  Lab Results  Component Value Date   WBC 5.8 09/17/2018   HGB 10.5 (L) 09/17/2018   HCT 31.1 (L) 09/17/2018   PLT 130 (L) 09/17/2018   LYMPHOPCT 30.9 08/10/2017   MONOPCT 7.0 08/10/2017   EOSPCT 1.6 08/10/2017   BASOPCT 1.3 08/10/2017    CMP:  Lab Results  Component Value Date   NA 139 09/17/2018   K 3.7 09/17/2018   CL 107 09/17/2018   CO2 23 09/17/2018   BUN 8 09/17/2018   CREATININE 0.84 09/17/2018   PROT 9.2 (H) 09/13/2018   ALBUMIN 4.3 09/13/2018   BILITOT 1.9 (H) 09/13/2018   ALKPHOS 88 09/13/2018   AST 22 09/13/2018   ALT 19 09/13/2018   Total Discharge time is about 33 minutes  Roxan Hockey M.D on 09/17/2018 at 2:17 PM  Go to [email or web address] -  for contact info  [facility] - Office  [phone]

## 2018-09-17 NOTE — Progress Notes (Signed)
Pt is ready for discharge she is alert and oriented and vital signs are stable. IV has been removed, catheter intact and she has been taken off telemetry. Patient has received all of her medications from transitions of pharmacy. I have reviewed all discharge instructions with patient and have answered all questions asked regarding instructions. Patient has all of her belongings. She will be transported home by her husband who is here with her now. She will leave unit by wheelchair and meet her husband at the front entrance of the hospital.

## 2018-09-17 NOTE — Discharge Instructions (Signed)
°  1)Very low-salt diet advised 2)Weigh yourself daily, call if you gain more than 3 pounds in 1 day or more than 5 pounds in 1 week as your diuretic medications may need to be adjusted 3)you are taking Brilinta and aspirin which are both blood thinners so Avoid ibuprofen/Advil/Aleve/Motrin/Goody Powders/Naproxen/BC powders/Meloxicam/Diclofenac/Indomethacin and other Nonsteroidal anti-inflammatory medications as these will make you more likely to bleed and can cause stomach ulcers, can also cause Kidney problems.  4) follow-up with cardiologist as advised within the next 2 weeks 5) follow-up with primary care physician for adjustment of your insulin and diabetic regimen 6) please note that there is been multiple changes to your medications

## 2018-09-17 NOTE — Progress Notes (Signed)
CARDIAC REHAB PHASE I   PRE:  Rate/Rhythm: 80 sR    BP: sitting 121/80    SaO2:   MODE:  Ambulation: 400 ft   POST:  Rate/Rhythm: 88 SR    BP: sitting 141/86     SaO2:   Tolerated well, no c/o. Ed completed with good reception.  Understands importance of Brilinta. Will refer to [place] [facility]. Stressed importance of exercise and DM management for her heart.  [phone]  Harriet Masson CES, ACSM 09/17/2018 11:22 AM

## 2018-09-17 NOTE — Care Management Note (Signed)
Case Management Note  Patient Details  Name: Danielle Flores MRN: [id number] Date of Birth: [birth year]  Subjective/Objective: CAD s/p DES to RDPA                   Action/Plan: NCM spoke to pt and she is currently without insurance coverage. She did apply for Medicaid. Provided pt with MATCH with $3 copay and once per year use. Brilinta 30 day free trial card given and patient assistance application. Explained she will need to take to her appt on 2/21 with PCP (or to Cardiologist office) and the office can fax to [employer].   Expected Discharge Date:              Expected Discharge Plan:  Home/Self Care  In-House Referral:  NA  Discharge planning Services  CM Consult, MATCH Program, Medication Assistance  Post Acute Care Choice:  NA Choice offered to:  NA  DME Arranged:  N/A DME Agency:  NA  HH Arranged:  NA HH Agency:  NA  Status of Service:  Completed, signed off  If discussed at Long Length of Stay Meetings, dates discussed:    Additional Comments:  Elliot Cousin, RN 09/17/2018, 9:58 AM

## 2018-09-17 NOTE — Progress Notes (Addendum)
Progress Note  Patient Name: Danielle Flores Date of Encounter: 09/17/2018  Primary Cardiologist: No primary care provider on file.   Subjective   Patient doing well today. She is without CP or SOB. She has not ambulated very much since the cath yesterday. She is anxious to leave the hospital but would like to talk to the diabetes educator. Discussed the plan for medication changes today. All questions and concerns addressed.   Inpatient Medications    Scheduled Meds: . aspirin EC  81 mg Oral Daily  . atorvastatin  80 mg Oral q1800  . heparin injection (subcutaneous)  5,000 Units Subcutaneous Q8H  . insulin aspart  0-15 Units Subcutaneous TID WC  . insulin aspart  0-5 Units Subcutaneous QHS  . insulin aspart  4 Units Subcutaneous TID WC  . insulin NPH Human  15 Units Subcutaneous BID AC & HS  . isosorbide mononitrate  30 mg Oral Daily  . metoprolol tartrate  150 mg Oral BID  . potassium chloride  40 mEq Oral Once  . sodium chloride flush  3 mL Intravenous Q12H  . ticagrelor  90 mg Oral BID   Continuous Infusions:  PRN Meds: hydrALAZINE, ondansetron (ZOFRAN) IV, sodium chloride flush   Vital Signs    Vitals:   09/16/18 1800 09/16/18 2149 09/16/18 2342 09/17/18 0402  BP: (!) 146/84 130/74 133/69 137/73  Pulse: 77 95 76 76  Resp: 19  20 16   Temp: 99.1 F (37.3 C)  99.2 F (37.3 C) 99 F (37.2 C)  TempSrc: Oral  Oral Oral  SpO2: 98%  100% 99%  Weight:      Height:        Intake/Output Summary (Last 24 hours) at 09/17/2018 0751 Last data filed at 09/16/2018 1900 Gross per 24 hour  Intake 653.9 ml  Output -  Net 653.9 ml   Filed Weights   09/13/18 0041  Weight: 85.7 kg   Telemetry    NSR - Personally Reviewed  ECG    Sinus with normal axis and intervals. LVH. - Personally Reviewed  Physical Exam   Today's Vitals   09/16/18 2045 09/16/18 2149 09/16/18 2342 09/17/18 0402  BP:  130/74 133/69 137/73  Pulse:  95 76 76  Resp:   20 16  Temp:   99.2 F  (37.3 C) 99 F (37.2 C)  TempSrc:   Oral Oral  SpO2:   100% 99%  Weight:      Height:      PainSc: 0-No pain      Body mass index is 30.51 kg/m.  GEN: No acute distress.   Neck: No JVD Cardiac: RRR, no murmurs, rubs, or gallops.  Respiratory: Clear to auscultation bilaterally. GI: Soft, nontender, non-distended  MS: No edema; No deformity. Neuro:  Nonfocal  Psych: Normal affect   Labs    Chemistry Recent Labs  Lab 09/13/18 0039  09/14/18 0030 09/16/18 0250 09/17/18 0232  NA 142   < > 149* 141 139  K 5.1   < > 3.1* 3.1* 3.7  CL 104   < > 119* 107 107  CO2 10*   < > 22 21* 23  GLUCOSE 928*   < > 107* 155* 227*  BUN 47*   < > 24* 9 8  CREATININE 2.15*   < > 1.17* 0.75 0.84  CALCIUM 10.3   < > 9.4 8.6* 8.6*  PROT 9.2*  --   --   --   --   ALBUMIN  4.3  --   --   --   --   AST 22  --   --   --   --   ALT 19  --   --   --   --   ALKPHOS 88  --   --   --   --   BILITOT 1.9*  --   --   --   --   GFRNONAA 25*   < > 52* >60 >60  GFRAA 29*   < > >60 >60 >60  ANIONGAP 28*   < > 8 13 9    < > = values in this interval not displayed.    Hematology Recent Labs  Lab 09/15/18 0228 09/16/18 0250 09/17/18 0232  WBC 6.7 5.8 5.8  RBC 4.65 4.68 4.22  HGB 11.7* 11.6* 10.5*  HCT 34.1* 35.1* 31.1*  MCV 73.3* 75.0* 73.7*  MCH 25.2* 24.8* 24.9*  MCHC 34.3 33.0 33.8  RDW 14.3 14.5 14.1  PLT 159 131* 130*   Cardiac Enzymes Recent Labs  Lab 09/13/18 0343 09/14/18 1109 09/14/18 1608 09/14/18 2257  TROPONINI 4.31* 0.44* 0.41* 0.41*    Recent Labs  Lab 09/13/18 0046  TROPIPOC 2.80*     BNPNo results for input(s): BNP, PROBNP in the last 168 hours.   DDimer No results for input(s): DDIMER in the last 168 hours.   Radiology    No results found.  Cardiac Studies   Left Heart Cath 09/16/2018  Prox RCA to Mid RCA lesion is 25% stenosed.  Prox Cx to Mid Cx lesion is 25% stenosed.  Ost LAD lesion is 25% stenosed.  RPDA lesion is 95% stenosed.  A drug-eluting  stent was successfully placed using a STENT SYNERGY DES 2.5X16.  Post intervention, there is a 0% residual stenosis.  The left ventricular ejection fraction is greater than 65% by visual estimate.  There is hyperdynamic left ventricular systolic function.  LV end diastolic pressure is normal. LVEDP 7 mm Hg.  There is no aortic valve stenosis.  Echocardiogram 09/13/2018  1. The left ventricle has normal systolic function of 60-65%. The cavity size was normal. There is severe septal hypertrophy with otherwise moderate concentric LVH. Echo evidence of pseudonormalization in diastolic relaxation Elevated left ventricular  end-diastolic pressure.  2. 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 There is a mid cavitary gradient with a peak velocity 2.18 m/s and peak gradient 19 mmHg.  3. The right ventricle has normal systolic function. The cavity was normal. There is no increase in right ventricular wall thickness.  4. The mitral valve is normal in structure.  5. The tricuspid valve is normal in structure.  6. The aortic valve is normal in structure.  7. The pulmonic valve was normal in structure.  8. Grade 2 diastolic dysfunction.  Patient Profile     Danielle Flores a 53 y.o.femalewith a hx of HTN, HLD and uncontrolled DM IIwho is being seen for the evaluation of elevated troponin in the setting of DKA. Left heart cath subsequently illustrated 95% RDPA stenosis and DES was placed.  Assessment & Plan    CAD s/p DES to RDPA Elevated Troponin  - No chest pain or SOB - Left heart cath with 95% stenosis of RDPA. DES placed. LVEF ~60% - Continue Atorvastatin 80 mg QD and Metoprolol tartrate 150 mg BID  - Continue DAPT with ASA 81 mg QD and Ticagrelor 90 mg QD - Start ACE today, Lisinopril 10 mg QD. Target BP is <  130/80.  - Stable from cardiac standpoint  - Will need cardiology follow-up   For questions or updates, please contact [facility] Please consult  [email or web address] for contact info under Cardiology/STEMI.     Signed, Levora DredgeJustin Helberg, MD  09/17/2018, 7:51 AM     I have examined the patient and reviewed assessment and plan and discussed with patient.  Agree with above as stated.    Doing well post cath.  Right arm hematoma.  2+ right radial pulse.  Forearm soft.  Can use warm compresses to help with hematoma.    I stressed the importance of DAPT.  We should be able to give her a Brilinta 30 day coupon at discharge.  If Brilinta too expensive after that time, would switch to clopidogrel, after 300 mg loading dose, then 75 mg daily.   Add ACE-I given BP and diabetes.  Lance MussJayadeep Graceanne Guin

## 2018-09-17 NOTE — Progress Notes (Signed)
Inpatient Diabetes Program Recommendations  AACE/ADA: New Consensus Statement on Inpatient Glycemic Control (2015)  Target Ranges:  Prepandial:   less than 140 mg/dL      Peak postprandial:   less than 180 mg/dL (1-2 hours)      Critically ill patients:  140 - 180 mg/dL   Lab Results  Component Value Date   GLUCAP 259 (H) 09/17/2018   HGBA1C 15.0 (H) 09/13/2018   Spoke with patient to reinforce DM education done 4 days prior. Discussed with patient insulin delivery options for outpatient and showed her how to do the insulin pen again.  Reviewed home medications with patient. Patient is ordered 7030 18 units bid in addition to regular insulin tid with meals. Patient was very overwhelmed with 5 injections a day. Patient needed to go over which insulin was 70/30 and regular for safety a few times.   Discussed glucose goals. Discussed hypoglycemia signs symptoms and treatment.  Messaged Dr. Mariea Clonts to inquire about a more simplified home regimen for patient as she is on many medications and seems overwhelmed. However glucose levels are not at goal still. Would recommend 70/30 25 units BID (35 units of long acting and 15 units of short acting equivalents).  Thanks,  Christena Deem RN, MSN, BC-ADM Inpatient Diabetes Coordinator Team Pager [phone] (8a-5p)

## 2018-09-18 ENCOUNTER — Telehealth (HOSPITAL_COMMUNITY): Payer: Self-pay

## 2018-09-18 ENCOUNTER — Telehealth: Payer: Self-pay | Admitting: *Deleted

## 2018-09-18 NOTE — Telephone Encounter (Signed)
Left msg for pt to call office and schedule appt

## 2018-09-18 NOTE — Telephone Encounter (Signed)
Attempted to call patient in regards to Cardiac Rehab - LM on VM telling pt to call back that she will need to follow up with her cardiologist.

## 2018-09-19 NOTE — Telephone Encounter (Signed)
Transition Care Management Follow-up Telephone Call   Date discharged?09/17/18   How have you been since you were released from the hospital? "I am doing good"   Do you understand why you were in the hospital? yes   Do you understand the discharge instructions? yes   Where were you discharged to? home   Items Reviewed:  Medications reviewed: Pt states she will discuss with PCP at appt  Allergies reviewed: yes  Dietary changes reviewed: yes  Referrals reviewed: yes   Functional Questionnaire:   Activities of Daily Living (ADLs):   She states they are independent in the following: ambulation, bathing and hygiene, feeding, continence, grooming, toileting and dressing States they require assistance with the following: n/a   Any transportation issues/concerns?: no   Any patient concerns? no   Confirmed importance and date/time of follow-up visits scheduled yes  Provider Appointment booked 09/27/2018. This appt was scheduled prior and pt declines sooner appt.  Confirmed with patient if condition begins to worsen call PCP or go to the ER.  Patient was given the office number and encouraged to call back with question or concerns.  : yes

## 2018-09-27 ENCOUNTER — Ambulatory Visit: Payer: Self-pay | Admitting: Family Medicine

## 2018-09-30 ENCOUNTER — Encounter: Payer: Self-pay | Admitting: Family Medicine

## 2018-09-30 NOTE — Telephone Encounter (Signed)
Called patient to see if she could come in any sooner.  She states that she is unable to come in due to no ins and she has to pay out of pocket.  I gave her the number to financial assistance thru [employer] [phone]).  She advise me that this would not have happened if Dr Laury Axon dosed her metformin right.  I advised her that she has not been seen since 2018 and usually Dr.  Laury Axon like to see you every 6 months or so.  Patient stated that she will call the financial assistant people.  Appointment made for 10/11/18, cause that is when her husband gets paid.

## 2018-09-30 NOTE — Telephone Encounter (Signed)
Pt should be referred to endo --- due to admission for dka

## 2018-10-11 ENCOUNTER — Ambulatory Visit: Payer: Self-pay | Admitting: Family Medicine

## 2018-10-11 ENCOUNTER — Encounter: Payer: Self-pay | Admitting: Family Medicine

## 2018-10-11 VITALS — BP 142/88 | HR 75 | Wt 180.0 lb

## 2018-10-11 DIAGNOSIS — E1151 Type 2 diabetes mellitus with diabetic peripheral angiopathy without gangrene: Secondary | ICD-10-CM

## 2018-10-11 DIAGNOSIS — E1165 Type 2 diabetes mellitus with hyperglycemia: Secondary | ICD-10-CM

## 2018-10-11 DIAGNOSIS — IMO0002 Reserved for concepts with insufficient information to code with codable children: Secondary | ICD-10-CM

## 2018-10-11 DIAGNOSIS — I2510 Atherosclerotic heart disease of native coronary artery without angina pectoris: Secondary | ICD-10-CM

## 2018-10-11 DIAGNOSIS — I10 Essential (primary) hypertension: Secondary | ICD-10-CM

## 2018-10-11 DIAGNOSIS — I214 Non-ST elevation (NSTEMI) myocardial infarction: Secondary | ICD-10-CM

## 2018-10-11 LAB — GLUCOSE, POCT (MANUAL RESULT ENTRY): POC Glucose: 79 mg/dl (ref 70–99)

## 2018-10-11 NOTE — Assessment & Plan Note (Signed)
Pt has f/u with cardiology next week On brilinta and asa

## 2018-10-11 NOTE — Assessment & Plan Note (Signed)
On brilinta and asa F/u cardiology Check labs 3 months

## 2018-10-11 NOTE — Patient Instructions (Signed)
Carbohydrate Counting for Diabetes Mellitus, Adult  Carbohydrate counting is a method of keeping track of how many carbohydrates you eat. Eating carbohydrates naturally increases the amount of sugar (glucose) in the blood. Counting how many carbohydrates you eat helps keep your blood glucose within normal limits, which helps you manage your diabetes (diabetes mellitus). It is important to know how many carbohydrates you can safely have in each meal. This is different for every person. A diet and nutrition specialist (registered dietitian) can help you make a meal plan and calculate how many carbohydrates you should have at each meal and snack. Carbohydrates are found in the following foods:  Grains, such as breads and cereals.  Dried beans and soy products.  Starchy vegetables, such as potatoes, peas, and corn.  Fruit and fruit juices.  Milk and yogurt.  Sweets and snack foods, such as cake, cookies, candy, chips, and soft drinks. How do I count carbohydrates? There are two ways to count carbohydrates in food. You can use either of the methods or a combination of both. Reading "Nutrition Facts" on packaged food The "Nutrition Facts" list is included on the labels of almost all packaged foods and beverages in the [place] It includes:  The serving size.  Information about nutrients in each serving, including the grams (g) of carbohydrate per serving. To use the "Nutrition Facts":  Decide how many servings you will have.  Multiply the number of servings by the number of carbohydrates per serving.  The resulting number is the total amount of carbohydrates that you will be having. Learning standard serving sizes of other foods When you eat carbohydrate foods that are not packaged or do not include "Nutrition Facts" on the label, you need to measure the servings in order to count the amount of carbohydrates:  Measure the foods that you will eat with a food scale or measuring cup, if needed.   Decide how many standard-size servings you will eat.  Multiply the number of servings by 15. Most carbohydrate-rich foods have about 15 g of carbohydrates per serving. ? For example, if you eat 8 oz (170 g) of strawberries, you will have eaten 2 servings and 30 g of carbohydrates (2 servings x 15 g = 30 g).  For foods that have more than one food mixed, such as soups and casseroles, you must count the carbohydrates in each food that is included. The following list contains standard serving sizes of common carbohydrate-rich foods. Each of these servings has about 15 g of carbohydrates:   hamburger bun or  English muffin.   oz (15 mL) syrup.   oz (14 g) jelly.  1 slice of bread.  1 six-inch tortilla.  3 oz (85 g) cooked rice or pasta.  4 oz (113 g) cooked dried beans.  4 oz (113 g) starchy vegetable, such as peas, corn, or potatoes.  4 oz (113 g) hot cereal.  4 oz (113 g) mashed potatoes or  of a large baked potato.  4 oz (113 g) canned or frozen fruit.  4 oz (120 mL) fruit juice.  4-6 crackers.  6 chicken nuggets.  6 oz (170 g) unsweetened dry cereal.  6 oz (170 g) plain fat-free yogurt or yogurt sweetened with artificial sweeteners.  8 oz (240 mL) milk.  8 oz (170 g) fresh fruit or one small piece of fruit.  24 oz (680 g) popped popcorn. Example of carbohydrate counting Sample meal  3 oz (85 g) chicken breast.  6 oz (170 g)   brown rice.  4 oz (113 g) corn.  8 oz (240 mL) milk.  8 oz (170 g) strawberries with sugar-free whipped topping. Carbohydrate calculation 1. Identify the foods that contain carbohydrates: ? Rice. ? Corn. ? Milk. ? Strawberries. 2. Calculate how many servings you have of each food: ? 2 servings rice. ? 1 serving corn. ? 1 serving milk. ? 1 serving strawberries. 3. Multiply each number of servings by 15 g: ? 2 servings rice x 15 g = 30 g. ? 1 serving corn x 15 g = 15 g. ? 1 serving milk x 15 g = 15 g. ? 1 serving  strawberries x 15 g = 15 g. 4. Add together all of the amounts to find the total grams of carbohydrates eaten: ? 30 g + 15 g + 15 g + 15 g = 75 g of carbohydrates total. Summary  Carbohydrate counting is a method of keeping track of how many carbohydrates you eat.  Eating carbohydrates naturally increases the amount of sugar (glucose) in the blood.  Counting how many carbohydrates you eat helps keep your blood glucose within normal limits, which helps you manage your diabetes.  A diet and nutrition specialist (registered dietitian) can help you make a meal plan and calculate how many carbohydrates you should have at each meal and snack. This information is not intended to replace advice given to you by your health care provider. Make sure you discuss any questions you have with your health care provider. Document Released: 07/24/2005 Document Revised: 01/31/2017 Document Reviewed: 01/05/2016 Elsevier Interactive Patient Education  2019 Elsevier Inc.  

## 2018-10-11 NOTE — Progress Notes (Signed)
Patient ID: Danielle Flores, female    DOB: 1966/02/27  Age: 53 y.o. MRN: [id number]    Subjective:  Subjective  HPI Danielle Flores presents for f/u hosp for dka , nstemi Mi.  She was d/c 2/11 She presented to the er with slurred speech , cp , elevated bs.  She was found to be in dka, and had nstemi   LHC done 09/16/18--- prox RCA to mid RCA 25% stenosed prox cx to mid cx 25% stenosed Ost lad 25% stenosedrpda 95% stenosed A drug eluting stent was placed  L vent ef >65% No AS LV end diastolic pressure in normal  Echo-- LVER 00-17%, grade 2 diastolic dysfunction  Pt was d/c home on asa and br Review of Systems  Constitutional: Negative for appetite change, diaphoresis, fatigue and unexpected weight change.  Eyes: Negative for pain, redness and visual disturbance.  Respiratory: Negative for cough, chest tightness, shortness of breath and wheezing.   Cardiovascular: Negative for chest pain, palpitations and leg swelling.  Endocrine: Negative for cold intolerance, heat intolerance, polydipsia, polyphagia and polyuria.  Genitourinary: Negative for difficulty urinating, dysuria and frequency.  Neurological: Negative for dizziness, light-headedness, numbness and headaches.    History Past Medical History:  Diagnosis Date  . Hyperglycemia   . Hyperlipidemia   . Hypertension     She has a past surgical history that includes Cesarean section; LEFT HEART CATH AND CORONARY ANGIOGRAPHY (N/A, 09/16/2018); and CORONARY STENT INTERVENTION (N/A, 09/16/2018).   Her family history includes Diabetes in an other family member; Heart disease in her maternal grandmother; Sickle cell anemia in her paternal aunt.She reports that she has never smoked. She has never used smokeless tobacco. She reports current alcohol use. She reports that she does not use drugs.  Current Outpatient Medications on File Prior to Visit  Medication Sig Dispense Refill  . aspirin EC 81 MG EC tablet Take 1 tablet (81 mg total)  by mouth daily with breakfast. 30 tablet 11  . atorvastatin (LIPITOR) 80 MG tablet Take 1 tablet (80 mg total) by mouth every evening. 30 tablet 11  . blood glucose meter kit and supplies KIT Dispense based on patient and insurance preference. Use up to four times daily as directed. (FOR ICD-9 250.00, 250.01). 1 each 0  . blood glucose meter kit and supplies Relion Prime or Dispense other brand based on patient and insurance preference. Use up to four times daily as directed. (FOR ICD-9 250.00, 250.01). 1 each 11  . insulin NPH-regular Human (NOVOLIN 70/30 RELION) (70-30) 100 UNIT/ML injection Inject 22 Units into the skin 2 (two) times daily with a meal. 10 mL 11  . isosorbide mononitrate (IMDUR) 30 MG 24 hr tablet Take 1 tablet (30 mg total) by mouth daily. 30 tablet 11  . lisinopril-hydrochlorothiazide (ZESTORETIC) 10-12.5 MG tablet Take 1 tablet by mouth daily. 30 tablet 11  . metFORMIN (GLUCOPHAGE) 1000 MG tablet Take 1 tablet (1,000 mg total) by mouth 2 (two) times daily with a meal. 60 tablet 11  . metoprolol tartrate 75 MG TABS Take 150 mg by mouth 2 (two) times daily. 120 tablet 11  . ticagrelor (BRILINTA) 90 MG TABS tablet Take 1 tablet (90 mg total) by mouth 2 (two) times daily. 60 tablet 11   No current facility-administered medications on file prior to visit.      Objective:  Objective  Physical Exam Vitals signs and nursing note reviewed.  Constitutional:      Appearance: She is well-developed.  HENT:  Head: Normocephalic and atraumatic.  Eyes:     Conjunctiva/sclera: Conjunctivae normal.  Neck:     Musculoskeletal: Normal range of motion and neck supple.     Thyroid: No thyromegaly.     Vascular: No carotid bruit or JVD.  Cardiovascular:     Rate and Rhythm: Normal rate and regular rhythm.     Heart sounds: Normal heart sounds. No murmur.  Pulmonary:     Effort: Pulmonary effort is normal. No respiratory distress.     Breath sounds: Normal breath sounds. No wheezing  or rales.  Chest:     Chest wall: No tenderness.  Neurological:     Mental Status: She is alert and oriented to person, place, and time.    BP (!) 142/88   Pulse 75   Wt 180 lb (81.6 kg)   LMP  (LMP Unknown)   SpO2 98%   BMI 29.05 kg/m  Wt Readings from Last 3 Encounters:  10/11/18 180 lb (81.6 kg)  09/13/18 189 lb (85.7 kg)  08/06/17 186 lb 12.8 oz (84.7 kg)     Lab Results  Component Value Date   WBC 5.8 09/17/2018   HGB 10.5 (L) 09/17/2018   HCT 31.1 (L) 09/17/2018   PLT 130 (L) 09/17/2018   GLUCOSE 227 (H) 09/17/2018   CHOL 167 08/10/2017   TRIG 117.0 08/10/2017   HDL 31.80 (L) 08/10/2017   LDLCALC 112 (H) 08/10/2017   ALT 19 09/13/2018   AST 22 09/13/2018   NA 139 09/17/2018   K 3.7 09/17/2018   CL 107 09/17/2018   CREATININE 0.84 09/17/2018   BUN 8 09/17/2018   CO2 23 09/17/2018   TSH 1.11 04/12/2012   HGBA1C 15.0 (H) 09/13/2018   MICROALBUR 3.2 (H) 08/10/2017    Ct Head Wo Contrast  Result Date: 09/13/2018 CLINICAL DATA:  Slurred speech EXAM: CT HEAD WITHOUT CONTRAST TECHNIQUE: Contiguous axial images were obtained from the base of the skull through the vertex without intravenous contrast. COMPARISON:  None. FINDINGS: Brain: No evidence of acute infarction, hemorrhage, extra-axial collection, ventriculomegaly, or mass effect. Old right basal ganglia lacunar infarct. Generalized cerebral atrophy. Periventricular white matter low attenuation likely secondary to microangiopathy. Vascular: No hyperdense vessel. No significant intracranial atherosclerotic disease. Skull: Negative for fracture or focal lesion. Sinuses/Orbits: Visualized portions of the orbits are unremarkable. Visualized portions of the paranasal sinuses and mastoid air cells are unremarkable. Other: None. IMPRESSION: No acute intracranial pathology. Electronically Signed   By: Kathreen Devoid   On: 09/13/2018 01:36   Dg Chest Port 1 View  Result Date: 09/13/2018 CLINICAL DATA:  Slurred speech, hand  cramping for 48 hours EXAM: PORTABLE CHEST 1 VIEW COMPARISON:  None. FINDINGS: The heart size and mediastinal contours are within normal limits. Both lungs are clear. The visualized skeletal structures are unremarkable. IMPRESSION: No active disease. Electronically Signed   By: Kathreen Devoid   On: 09/13/2018 01:34     Assessment & Plan:  Plan  I am having Hoyle Sauer T. Behney "Angelicia Esch" maintain her blood glucose meter kit and supplies, aspirin, atorvastatin, isosorbide mononitrate, Metoprolol Tartrate, metFORMIN, ticagrelor, lisinopril-hydrochlorothiazide, blood glucose meter kit and supplies, and insulin NPH-regular Human.  No orders of the defined types were placed in this encounter.   Problem List Items Addressed This Visit      Unprioritized   CAD in native artery/Stent 09/16/2018    On brilinta and asa F/u cardiology Check labs 3 months      Relevant Orders  Ambulatory referral to Cardiology   DM (diabetes mellitus) type II uncontrolled, periph vascular disorder ([place]) - Primary   Relevant Orders   POCT Glucose (CBG) (Completed)   Essential hypertension    , no changes to meds. Encouraged heart healthy diet such as the DASH diet and exercise as tolerated. ---- slightly elevated today        NSTEMI (non-ST elevated myocardial infarction) ([place])    Pt has f/u with cardiology next week On brilinta and asa      Relevant Orders   Ambulatory referral to Cardiology      Follow-up: Return in about 3 months (around 01/11/2019), or if symptoms worsen or fail to improve, for hypertension, hyperlipidemia, diabetes II.  Ann Held, DO

## 2018-10-11 NOTE — Assessment & Plan Note (Signed)
,   no changes to meds. Encouraged heart healthy diet such as the DASH diet and exercise as tolerated. ---- slightly elevated today

## 2018-10-14 ENCOUNTER — Telehealth: Payer: Self-pay | Admitting: Family Medicine

## 2018-10-14 NOTE — Telephone Encounter (Signed)
Pt dropped off document to be filled out by provider ( 5 pages [employer]) Pt would like document to be faxed when ready at [phone] and ok to call pt to let her know that document is faxed at (pt tel) [phone]. Document put at front office tray under provider name.

## 2018-10-16 ENCOUNTER — Other Ambulatory Visit: Payer: Self-pay | Admitting: Family Medicine

## 2018-10-16 NOTE — Telephone Encounter (Signed)
Copied from CRM [id number]. Topic: Quick Communication - Rx Refill/Question >> Oct 16, 2018  7:44 AM Crist Infante wrote: Medication: ticagrelor (BRILINTA) 90 MG TABS tablet ainsulin NPH-regular Human (NOVOLIN 70/30 RELION) (70-30) 100 UNIT/ML injection atorvastatin (LIPITOR) 80 MG tablet isosorbide mononitrate (IMDUR) 30 MG 24 hr tablet lisinopril-hydrochlorothiazide (ZESTORETIC) 10-12.5 MG tablet aspirin EC 81 MG EC tablet  Pt states she is on her last day of all her meds.  They should have been refilled 09/17/2018. But the pharmacy never got. Pt was very upset that she may run out. Hopes she can get today  [facility] [address] ([address] [place] - [address] [phone] (Phone) [phone] (Fax)

## 2018-10-16 NOTE — Telephone Encounter (Signed)
Received PAP Form from [employer] for Brilinta. completed as much as possible for prescription and provider's information; forwarded to provider/SLS 03/11

## 2018-10-16 NOTE — Telephone Encounter (Signed)
Patient returned call- she states that all her medications were to be transferred and put under Dr Zola Button at her appointment 10/11/18- she wanted all of them sent to [facility]. She made this very clear at the appointment and she does not understand why it was not done.  Told patient I would forward her request to PCP to change Rx to her name- I can not do that. Please send Rx to [facility] for her.

## 2018-10-16 NOTE — Telephone Encounter (Signed)
In review of medications- patient has 1 year of refills ordered in February from another provider. Left message for patient that [facility] Rx is at [facility] and she can have them transferred to [facility] by calling pharmacy- will be glad to review Rx locations with her if she will call office back.

## 2018-10-17 ENCOUNTER — Other Ambulatory Visit: Payer: Self-pay

## 2018-10-17 MED ORDER — ATORVASTATIN CALCIUM 80 MG PO TABS: 80.0000 mg | ORAL_TABLET | Freq: Every evening | ORAL | 11 refills | Status: DC

## 2018-10-17 MED ORDER — ISOSORBIDE MONONITRATE ER 30 MG PO TB24: 30.0000 mg | ORAL_TABLET | Freq: Every day | ORAL | 11 refills | Status: DC

## 2018-10-17 MED ORDER — METOPROLOL TARTRATE 75 MG PO TABS: 150.0000 mg | ORAL_TABLET | Freq: Two times a day (BID) | ORAL | 11 refills | Status: DC

## 2018-10-17 MED ORDER — TICAGRELOR 90 MG PO TABS: 90.0000 mg | ORAL_TABLET | Freq: Two times a day (BID) | ORAL | 6 refills | Status: DC

## 2018-10-17 MED ORDER — INSULIN NPH ISOPHANE & REGULAR (70-30) 100 UNIT/ML ~~LOC~~ SUSP: 22.0000 [IU] | Freq: Two times a day (BID) | SUBCUTANEOUS | 11 refills | Status: DC

## 2018-10-17 MED ORDER — LISINOPRIL-HYDROCHLOROTHIAZIDE 10-12.5 MG PO TABS: 1.0000 | ORAL_TABLET | Freq: Every day | ORAL | 11 refills | Status: DC

## 2018-10-17 NOTE — Telephone Encounter (Signed)
Pt checking status on form being completed.

## 2018-10-18 ENCOUNTER — Other Ambulatory Visit: Payer: Self-pay | Admitting: Family Medicine

## 2018-10-18 ENCOUNTER — Telehealth: Payer: Self-pay | Admitting: Family Medicine

## 2018-10-18 MED ORDER — TICAGRELOR 90 MG PO TABS: 90.0000 mg | ORAL_TABLET | Freq: Two times a day (BID) | ORAL | 6 refills | Status: AC

## 2018-10-18 NOTE — Telephone Encounter (Signed)
She needs to call her cardiologist-- they prescribe this

## 2018-10-18 NOTE — Telephone Encounter (Signed)
Copied from CRM [id number]. Topic: Quick Communication - Rx Refill/Question >> Oct 18, 2018  9:30 AM Frances Furbish L wrote: Medication: ticagrelor (BRILINTA) 90 MG TABS tablet [[id number]]  Pt called and stated that medication is to expensive and would like to know what she should do. Pt states that she has not had any medication for 2 days. Please advise

## 2018-10-18 NOTE — Telephone Encounter (Signed)
She needed to fill out the paperwork and bring it in to cardiology --- cardiology writes this med

## 2018-10-18 NOTE — Telephone Encounter (Signed)
Spoke w/ Pt- recommend she call cardiology- sometimes they have samples of medications that they can give her. Pt verbalized understanding.

## 2018-10-18 NOTE — Telephone Encounter (Signed)
Patient states she contacted her Cardiologist & they advised her that since she has not been seen there they can not give her samples. Also, they said that the cardiologist did not write the script - that she needs to follow back up with Dr Zola Button. They said this has to be managed by Dr Zola Button. She said she is very upset about all of this because she needs this medication.

## 2018-10-18 NOTE — Telephone Encounter (Signed)
Make sure it is refillled for her She was supposed to see cardiology within 2 weeks of being d/c ---

## 2018-10-18 NOTE — Telephone Encounter (Signed)
Pt calling back and stated that she has had medication [facility] and they will not have medication until Monday. Pt would like to know if something else can be called in today so that she can have something for the weekend.  Pt states that she does not have appointment with cardiology until April 30th.    AQ#[phone]  Please advise

## 2018-10-18 NOTE — Telephone Encounter (Signed)
Please advise 

## 2018-10-18 NOTE — Telephone Encounter (Signed)
Pt called stating the Brilinta is over $800 at the pharmacy and she cannot afford that. Pt notes that on Monday she brought [place] patient [place] for [place] [employer] [place]. She called [place] [employer] [place] and was told nothing has been received for her. Pt asking if this can be taken care of today for her to qualify as she needs this medication since her surgery.

## 2018-10-18 NOTE — Telephone Encounter (Signed)
Rx's sent on 10/17/2018 to [facility].

## 2018-10-21 NOTE — Telephone Encounter (Signed)
Paperwork is done and was in [employer] ---  P put on [employer]

## 2018-10-21 NOTE — Telephone Encounter (Signed)
Left message on machine that medication was sent in on Friday (not sure if pharmacy called her) and also Dr. Laury Axon said she had faxed paperwork on late Friday afternoon.

## 2018-10-21 NOTE — Telephone Encounter (Addendum)
Unsure of what is going on with this after forwarding to provider on 10/16/18: SLS 03/16 Conversation: Medication Problem  (Newest Message First)  October 21, 2018   7:57 AM  Conrad Burlingame, CMA routed this conversation to Blima Ledger, CMA   October 18, 2018  Zola Button, [place]ling Congress, DO  to [place], CMA       12:55 PM  Note    Make sure it is refillled for her She was supposed to see cardiology within 2 weeks of being d/c ---       12:53 PM  Conrad Carpio, CMA routed this conversation to Zola Button, [place] R, DO    12:29 PM  Jolayne Haines L routed this conversation to [facility]   Ladona Ridgel, [place] L    12:29 PM  Note    Patient states she contacted her Cardiologist & they advised her that since she has not been seen there they can not give her samples. Also, they said that the cardiologist did not write the script - that she needs to follow back up with Dr Zola Button. They said this has to be managed by Dr Zola Button. She said she is very upset about all of this because she needs this medication.    Conrad Quinton, CMA    12:04 PM  Note    Spoke w/ Pt- recommend she call cardiology- sometimes they have samples of medications that they can give her. Pt verbalized understanding.     Zola Button, Grayling Congress, DO  to [place], [place], CMA      11:38 AM  Note    She needs to call her cardiologist-- they prescribe this     11:15 AM  Conrad [place], CMA routed this conversation to Zola Button, [place] R, DO    11:08 AM  Frances Furbish L routed this conversation to [facility]   Fanny Bien   11:08 AM  Note    Pt calling back and stated that she has had medication [facility] and they will not have medication until Monday. Pt would like to know if something else can be called in today so that she can have something for the weekend.  Pt states that she does not have appointment with cardiology  until April 30th.    [phone]  Please advise     Zola Button, [place]ling Congress, DO  to Blima Ledger, CMA . Conrad , CMA      10:40 AM  Note    She needed to fill out the paperwork and bring it in to cardiology --- cardiology writes this med       10:27 AM  Jones Bales, CMA routed this conversation to Blima Ledger, CMA    10:02 AM  Rosario Jacks S routed this conversation to [facility]   Rosario Jacks S    10:02 AM  Note    Pt called stating the Marden Noble is over $800 at the pharmacy and she cannot afford that. Pt notes that on Monday she brought in patient assistance form for [place] [employer] [place]. She called [employer] [place] and was told nothing has been received for her. Pt asking if this can be taken care of today for her to qualify as she needs this medication since her surgery.     9:59 AM    [name] T "Jakhia Seydel" contacted Lamonte Richer, CMA  to [facility]  Irish Elders, DO       9:38 AM  Note    Please advise.      9:32 AM  Frances Furbish L routed this conversation to [facility]   Fanny Bien    9:32 AM  Note    Copied from CRM [id number]. Topic: Quick Communication - Rx Refill/Question >> Oct 18, 2018  9:30 AM Frances Furbish L wrote: Medication: ticagrelor (BRILINTA) 90 MG TABS tablet [[id number]]  Pt called and stated that medication is to expensive and would like to know what she should do. Pt states that she has not had any medication for 2 days. Please advise       9:30 AM   Eber Jones T. Casaus "[employer]" contacted Fanny Bien

## 2018-10-21 NOTE — Telephone Encounter (Signed)
Left message on machine that medication was sent in on Friday (not sure if pharmacy called her already) and also Dr. Laury Axon stated that she faxed form on late Friday.

## 2018-10-22 NOTE — Telephone Encounter (Signed)
Resent [place] [employer] [place] paperwork with fax confirmation for assurance that it was received. Patient informed, understood & agreed to call [employer] later and confirm they received fax and/or she will call me back so that I can call and speak with a representative/SLS 03/16

## 2018-10-24 ENCOUNTER — Telehealth (HOSPITAL_COMMUNITY): Payer: Self-pay

## 2018-10-24 NOTE — Telephone Encounter (Signed)
Attempted to call patient in regards to Cardiac Rehab - to let pt know we are closed at this time due to the COVID-19 and will contact once we have resume scheduling.  °LMTCB °

## 2018-10-25 ENCOUNTER — Ambulatory Visit: Payer: Self-pay | Admitting: Family Medicine

## 2018-11-04 ENCOUNTER — Encounter: Payer: Self-pay | Admitting: *Deleted

## 2018-12-03 ENCOUNTER — Telehealth: Payer: Self-pay

## 2018-12-03 ENCOUNTER — Telehealth (HOSPITAL_COMMUNITY): Payer: Self-pay | Admitting: *Deleted

## 2018-12-03 NOTE — Telephone Encounter (Signed)
Called pt to set up possible evisit, left message asking pt to call the office.  

## 2018-12-04 NOTE — Telephone Encounter (Signed)
Left message for patient to call back  

## 2018-12-04 NOTE — Telephone Encounter (Signed)
Left message for patient to call back. I need to speak to this patient. Please let me know when she calls back.

## 2018-12-04 NOTE — Telephone Encounter (Signed)
Virtual Visit Pre-Appointment Phone Call TELEPHONE CALL NOTE  Danielle Flores has been deemed a candidate for a follow-up tele-health visit to limit community exposure during the Covid-19 pandemic. I spoke with the patient via phone to ensure availability of phone/video source, confirm preferred email & phone number, and discuss instructions and expectations.  I reminded Danielle Flores to be prepared with any vital sign and/or heart rhythm information that could potentially be obtained via home monitoring, at the time of her visit. I reminded Danielle Flores to expect a phone call prior to her visit.  Patient agrees to consent below.  Lattie Haw, RN 12/04/2018 2:40 PM   FULL LENGTH CONSENT FOR TELE-HEALTH VISIT   I hereby voluntarily request, consent and authorize [facility] and its employed or contracted physicians, physician assistants, nurse practitioners or other licensed health care professionals (the Practitioner), to provide me with telemedicine health care services (the "Services") as deemed necessary by the treating Practitioner. I acknowledge and consent to receive the Services by the Practitioner via telemedicine. I understand that the telemedicine visit will involve communicating with the Practitioner through live audiovisual communication technology and the disclosure of certain medical information by electronic transmission. I acknowledge that I have been given the opportunity to request an in-person assessment or other available alternative prior to the telemedicine visit and am voluntarily participating in the telemedicine visit.  I understand that I have the right to withhold or withdraw my consent to the use of telemedicine in the course of my care at any time, without affecting my right to future care or treatment, and that the Practitioner or I may terminate the telemedicine visit at any time. I understand that I have the right to inspect all information  obtained and/or recorded in the course of the telemedicine visit and may receive copies of available information for a reasonable fee.  I understand that some of the potential risks of receiving the Services via telemedicine include:  Marland Kitchen Delay or interruption in medical evaluation due to technological equipment failure or disruption; . Information transmitted may not be sufficient (e.g. poor resolution of images) to allow for appropriate medical decision making by the Practitioner; and/or  . In rare instances, security protocols could fail, causing a breach of personal health information.  Furthermore, I acknowledge that it is my responsibility to provide information about my medical history, conditions and care that is complete and accurate to the best of my ability. I acknowledge that Practitioner's advice, recommendations, and/or decision may be based on factors not within their control, such as incomplete or inaccurate data provided by me or distortions of diagnostic images or specimens that may result from electronic transmissions. I understand that the practice of medicine is not an exact science and that Practitioner makes no warranties or guarantees regarding treatment outcomes. I acknowledge that I will receive a copy of this consent concurrently upon execution via email to the email address I last provided but may also request a printed copy by calling the office of [facility].    I understand that my insurance will be billed for this visit.   I have read or had this consent read to me. . I understand the contents of this consent, which adequately explains the benefits and risks of the Services being provided via telemedicine.  . I have been provided ample opportunity to ask questions regarding this consent and the Services and have had my questions answered to my satisfaction. . I give  my informed consent for the services to be provided through the use of telemedicine in my medical care   By participating in this telemedicine visit I agree to the above.

## 2018-12-05 ENCOUNTER — Telehealth (INDEPENDENT_AMBULATORY_CARE_PROVIDER_SITE_OTHER): Payer: Self-pay | Admitting: Interventional Cardiology

## 2018-12-05 ENCOUNTER — Telehealth: Payer: Self-pay | Admitting: Interventional Cardiology

## 2018-12-05 ENCOUNTER — Other Ambulatory Visit: Payer: Self-pay

## 2018-12-05 ENCOUNTER — Encounter: Payer: Self-pay | Admitting: Interventional Cardiology

## 2018-12-05 VITALS — BP 161/88 | HR 69 | Ht 66.0 in | Wt 170.0 lb

## 2018-12-05 DIAGNOSIS — I119 Hypertensive heart disease without heart failure: Secondary | ICD-10-CM

## 2018-12-05 DIAGNOSIS — I252 Old myocardial infarction: Secondary | ICD-10-CM

## 2018-12-05 DIAGNOSIS — E1159 Type 2 diabetes mellitus with other circulatory complications: Secondary | ICD-10-CM

## 2018-12-05 DIAGNOSIS — I2510 Atherosclerotic heart disease of native coronary artery without angina pectoris: Secondary | ICD-10-CM

## 2018-12-05 DIAGNOSIS — I10 Essential (primary) hypertension: Secondary | ICD-10-CM

## 2018-12-05 MED ORDER — LISINOPRIL 20 MG PO TABS: 20.0000 mg | ORAL_TABLET | Freq: Every day | ORAL | 3 refills | Status: DC

## 2018-12-05 NOTE — Telephone Encounter (Signed)
Follow up   Please return call to patient on the home phone # on file.

## 2018-12-05 NOTE — Telephone Encounter (Signed)
The home number [phone] is not working and states that it is not accepting calls at this time. I specifically asked to be called when she called back. Left another message on cell VM for patient to call back.  CALL OR SECURE CHAT MESSAGE ME WHEN THE PATIENT CALLS BACK.

## 2018-12-05 NOTE — Telephone Encounter (Signed)
Follow Up:; ° ° °Returning your call. °

## 2018-12-05 NOTE — Progress Notes (Signed)
Virtual Visit via Video Note   This visit type was conducted due to national recommendations for restrictions regarding the COVID-19 Pandemic (e.g. social distancing) in an effort to limit this patient's exposure and mitigate transmission in our community.  Due to her co-morbid illnesses, this patient is at least at moderate risk for complications without adequate follow up.  This format is felt to be most appropriate for this patient at this time.  All issues noted in this document were discussed and addressed.  A limited physical exam was performed with this format.  Please refer to the patient's chart for her consent to telehealth for [facility]: Lambert Rhonda W.   Evaluation Performed:  Follow-up visit  Date:  12/05/2018   ID:  Danielle, Flores 12/07/1965, MRN [id number]  Patient Location: Home Provider Location: Home  PCP:  Ann Held, DO  Cardiologist:  No primary care provider on file. [place] Electrophysiologist:  None   Chief Complaint:  CAD  History of Present Illness:    Danielle Flores is a 53 y.o. female with a hx of HTN, HLD and uncontrolled DM.  "Cath in 09/2018 for NSTEMI showed:  Prox RCA to Mid RCA lesion is 25% stenosed.  Prox Cx to Mid Cx lesion is 25% stenosed.  Ost LAD lesion is 25% stenosed.  RPDA lesion is 95% stenosed.  A drug-eluting stent was successfully placed using a STENT SYNERGY DES 2.5X16.  Post intervention, there is a 0% residual stenosis.  The left ventricular ejection fraction is greater than 65% by visual estimate.  There is hyperdynamic left ventricular systolic function.  LV end diastolic pressure is normal. LVEDP 7 mm Hg.  There is no aortic valve stenosis.   Continue dual antiplatelet therapy."  Denies : Chest pain. Dizziness. Leg edema. Nitroglycerin use. Orthopnea. Palpitations. Paroxysmal nocturnal dyspnea. Shortness of breath. Syncope.   She was laid off.  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    BP at  home has been high.  It has been in the 488Q-916X systolic.  The patient does not have symptoms concerning for COVID-19 infection (fever, chills, cough, or new shortness of breath).    Past Medical History:  Diagnosis Date  . Hyperglycemia   . Hyperlipidemia   . Hypertension    Past Surgical History:  Procedure Laterality Date  . CESAREAN SECTION    . CORONARY STENT INTERVENTION N/A 09/16/2018   Procedure: CORONARY STENT INTERVENTION;  Surgeon: Jettie Booze, MD;  Location: [place] [facility];  Service: Cardiovascular;  Laterality: N/A;  . LEFT HEART CATH AND CORONARY ANGIOGRAPHY N/A 09/16/2018   Procedure: LEFT HEART CATH AND CORONARY ANGIOGRAPHY;  Surgeon: Jettie Booze, MD;  Location: [place] [facility];  Service: Cardiovascular;  Laterality: N/A;     Current Meds  Medication Sig  . aspirin EC 81 MG EC tablet Take 1 tablet (81 mg total) by mouth daily with breakfast.  . atorvastatin (LIPITOR) 80 MG tablet Take 1 tablet (80 mg total) by mouth every evening.  . blood glucose meter kit and supplies KIT Dispense based on patient and insurance preference. Use up to four times daily as directed. (FOR ICD-9 250.00, 250.01).  . blood glucose meter kit and supplies Relion Prime or Dispense other brand based on patient and insurance preference. Use up to four times daily as directed. (FOR ICD-9 250.00, 250.01).  . insulin NPH-regular Human (NOVOLIN 70/30 RELION) (70-30) 100 UNIT/ML injection Inject 22 Units into the skin 2 (two) times  daily with a meal.  . isosorbide mononitrate (IMDUR) 30 MG 24 hr tablet Take 1 tablet (30 mg total) by mouth daily.  Marland Kitchen lisinopril-hydrochlorothiazide (ZESTORETIC) 10-12.5 MG tablet Take 1 tablet by mouth daily.  . metFORMIN (GLUCOPHAGE) 1000 MG tablet Take 1 tablet (1,000 mg total) by mouth 2 (two) times daily with a meal.  . Metoprolol Tartrate 75 MG TABS Take 1.5 tablets by mouth 2 (two) times a day.  . ticagrelor (BRILINTA) 90 MG TABS tablet Take  1 tablet (90 mg total) by mouth 2 (two) times daily.     Allergies:   Patient has no known allergies.   Social History   Tobacco Use  . Smoking status: Never Smoker  . Smokeless tobacco: Never Used  Substance Use Topics  . Alcohol use: Yes  . Drug use: No     Family Hx: The patient's family history includes Diabetes in an other family member; Heart disease in her maternal grandmother; Sickle cell anemia in her paternal aunt.  ROS:   Please see the history of present illness.     All other systems reviewed and are negative.   Prior CV studies:   The following studies were reviewed today:  Cath study  Labs/Other Tests and Data Reviewed:    EKG:  An ECG dated 09/17/18 was personally reviewed today and demonstrated:  NSR, LVH  Recent Labs: 09/13/2018: ALT 19; Magnesium 3.0 09/17/2018: BUN 8; Creatinine, Ser 0.84; Hemoglobin 10.5; Platelets 130; Potassium 3.7; Sodium 139   Recent Lipid Panel Lab Results  Component Value Date/Time   CHOL 167 08/10/2017 08:47 AM   TRIG 117.0 08/10/2017 08:47 AM   HDL 31.80 (L) 08/10/2017 08:47 AM   CHOLHDL 5 08/10/2017 08:47 AM   LDLCALC 112 (H) 08/10/2017 08:47 AM    Wt Readings from Last 3 Encounters:  12/05/18 170 lb (77.1 kg)  10/11/18 180 lb (81.6 kg)  09/13/18 189 lb (85.7 kg)     Objective:    Vital Signs:  BP (!) 161/88   Pulse 69   Ht '5\' 6"'  (1.676 m)   Wt 170 lb (77.1 kg)   BMI 27.44 kg/m    VITAL SIGNS:  reviewed GEN:  no acute distress RESPIRATORY:  normal respiratory effort, symmetric expansion PSYCH:  normal affect exam limited due to video format  ASSESSMENT & PLAN:    1. CAD/Old MI:  No angina or CHF. Continue aggressive secondary prevention.  COntinue DAPT given recent stent.  No x at this time.  2. HTN: Increase to lisinopril 20.  Stop HCTZ.  Stop Isosorbide given that she is not having angina. 3. DM:  COntinue healthy diet and exercise.  Has had DKA in the past with dehydration.  Avoid diuretics at this  time.   COVID-19 Education: The signs and symptoms of COVID-19 were discussed with the patient and how to seek care for testing (follow up with PCP or arrange E-visit).  The importance of social distancing was discussed today.  Time:   Today, I have spent 25 minutes with the patient with telehealth technology discussing the above problems.     Medication Adjustments/Labs and Tests Ordered: Current medicines are reviewed at length with the patient today.  Concerns regarding medicines are outlined above.   Tests Ordered: No orders of the defined types were placed in this encounter.   Medication Changes: No orders of the defined types were placed in this encounter.   Disposition:  Follow up in 2 week(s)  Signed, Larae Grooms, MD  12/05/2018 11:31 AM    [facility]

## 2018-12-05 NOTE — Telephone Encounter (Signed)
Follow up:    Patient would like for you to call her at [phone]. Patient is upset she states they are using the wrong number. Pleas call patient back.

## 2018-12-05 NOTE — Telephone Encounter (Signed)
I have attempted multiple times to call patient. Left VM on cell #. Home # not accepting calls. Please let me know when patient calls back and I will take the call.

## 2018-12-05 NOTE — Patient Instructions (Addendum)
Medication Instructions:  Your physician has recommended you make the following change in your medication:   1. STOP: lisinopril-hydrochlorothiazide 10-12.5 mg tablet  2. STOP: isosorbide mononitrate (imdur) 30 mg tablet  3. START: lisinopril 20 mg tablet: Take 1 tablet by mouth once a day  Lab work: Your physician recommends that you return for lab work in: 1 week for BMET   If you have labs (blood work) drawn today and your tests are completely normal, you will receive your results only by: Marland Kitchen MyChart Message (if you have MyChart) OR . A paper copy in the mail If you have any lab test that is abnormal or we need to change your treatment, we will call you to review the results.  Testing/Procedures: None ordered  Follow-Up: . Follow up with Dr. Eldridge Dace via VIDEO Visit in 7-10 days  Any Other Special Instructions Will Be Listed Below (If Applicable).

## 2018-12-11 MED ORDER — LISINOPRIL 20 MG PO TABS: 20.0000 mg | ORAL_TABLET | Freq: Every day | ORAL | 3 refills | Status: DC

## 2018-12-11 NOTE — Telephone Encounter (Signed)
Called and spoke to patient. She states that her phone was messed up. Patient has not started the lisinopril 20 mg QD yet because she wants it sent to a different pharmacy. Rx sent to preferred pharmacy. Arranged for patient to have BMET drawn on 5/15 and Video Visit with Dr. Eldridge Dace on 5/20.

## 2018-12-11 NOTE — Telephone Encounter (Signed)
Follow up    Patient is returning call. She can only be reached at [phone]. I did a test call and the call went through.

## 2018-12-20 ENCOUNTER — Other Ambulatory Visit: Payer: Self-pay | Admitting: *Deleted

## 2018-12-20 ENCOUNTER — Other Ambulatory Visit: Payer: Self-pay

## 2018-12-20 DIAGNOSIS — I10 Essential (primary) hypertension: Secondary | ICD-10-CM

## 2018-12-20 LAB — BASIC METABOLIC PANEL
BUN/Creatinine Ratio: 15 (ref 9–23)
BUN: 13 mg/dL (ref 6–24)
CO2: 25 mmol/L (ref 20–29)
Calcium: 9.5 mg/dL (ref 8.7–10.2)
Chloride: 103 mmol/L (ref 96–106)
Creatinine, Ser: 0.89 mg/dL (ref 0.57–1.00)
GFR calc Af Amer: 84 mL/min/{1.73_m2} (ref >59)
GFR calc non Af Amer: 73 mL/min/{1.73_m2} (ref >59)
Glucose: 114 mg/dL — ABNORMAL HIGH (ref 65–99)
Potassium: 3.7 mmol/L (ref 3.5–5.2)
Sodium: 144 mmol/L (ref 134–144)

## 2018-12-25 ENCOUNTER — Telehealth: Payer: Self-pay | Admitting: Interventional Cardiology

## 2019-01-01 ENCOUNTER — Telehealth: Payer: Self-pay | Admitting: Family Medicine

## 2019-01-01 DIAGNOSIS — E1151 Type 2 diabetes mellitus with diabetic peripheral angiopathy without gangrene: Secondary | ICD-10-CM

## 2019-01-01 DIAGNOSIS — IMO0002 Reserved for concepts with insufficient information to code with codable children: Secondary | ICD-10-CM

## 2019-01-01 NOTE — Telephone Encounter (Signed)
Copied from CRM [id number]. Topic: Quick Communication - Rx Refill/Question >> Jan 01, 2019  3:46 PM Jaquita Rector A wrote: Medication: metFORMIN (GLUCOPHAGE) 1000 MG tablet, Metoprolol Tartrate 75 MG TABS   Has the patient contacted their pharmacy? Yes (Agent: If no, request that the patient contact the pharmacy for the refill.) (Agent: If yes, when and what did the pharmacy advise?)  Preferred Pharmacy (with phone number or street name): [id number] - [place], [place] - [address] AT [facility] [address] [facility] [address] [phone] (Phone) [phone] (Fax)    Agent: Please be advised that RX refills may take up to 3 business days. We ask that you follow-up with your pharmacy.

## 2019-01-01 NOTE — Telephone Encounter (Signed)
Dr Zola Button -- please advise Metoprolol request below. RX history looks different than current strength / directions. 10/16/18 refill encounter said pt requested all RXs be transferred to her PCP. Please advise if ok to send RX as listed historically on med list?

## 2019-01-02 MED ORDER — METOPROLOL TARTRATE 75 MG PO TABS: 1.5000 | ORAL_TABLET | Freq: Two times a day (BID) | ORAL | 1 refills | Status: DC

## 2019-01-02 MED ORDER — METFORMIN HCL 1000 MG PO TABS: 1000.0000 mg | ORAL_TABLET | Freq: Two times a day (BID) | ORAL | 1 refills | Status: DC

## 2019-01-02 NOTE — Telephone Encounter (Signed)
Sheketia-- Rxs have been sent. Pt has an appt scheduled with Dr Zola Button on 01/23/19. I attempted to call her to see how her BP is running but her line was busy.

## 2019-01-02 NOTE — Telephone Encounter (Signed)
Ok to send-- 3 months with 1 refill Is her bp still running high at home-- may need f/u / in office or virtual

## 2019-01-10 ENCOUNTER — Encounter: Payer: Self-pay | Admitting: Family Medicine

## 2019-01-10 ENCOUNTER — Ambulatory Visit: Payer: Self-pay

## 2019-01-10 NOTE — Telephone Encounter (Signed)
Call placed to patient. Message states not taking calls at this time

## 2019-01-13 NOTE — Telephone Encounter (Signed)
We can do virtual visit today or she can come in tomorrow bp does not really make sense

## 2019-01-14 NOTE — Telephone Encounter (Signed)
Can you schedule an appt with Dr. Etter Sjogren

## 2019-01-15 ENCOUNTER — Telehealth: Payer: Self-pay | Admitting: Family Medicine

## 2019-01-15 NOTE — Telephone Encounter (Signed)
LVM for pt to call the office and have schedule VOV with provider regarding about her medication for BP, pt send a message still having elevated BP and needing medication.

## 2019-01-16 ENCOUNTER — Encounter: Payer: Self-pay | Admitting: Family Medicine

## 2019-01-16 ENCOUNTER — Ambulatory Visit (INDEPENDENT_AMBULATORY_CARE_PROVIDER_SITE_OTHER): Payer: PRIVATE HEALTH INSURANCE | Admitting: Family Medicine

## 2019-01-16 ENCOUNTER — Other Ambulatory Visit: Payer: Self-pay

## 2019-01-16 VITALS — BP 171/88 | HR 70

## 2019-01-16 DIAGNOSIS — I10 Essential (primary) hypertension: Secondary | ICD-10-CM

## 2019-01-16 MED ORDER — LISINOPRIL 40 MG PO TABS: 40.0000 mg | ORAL_TABLET | Freq: Every day | ORAL | 3 refills | Status: DC

## 2019-01-16 NOTE — Progress Notes (Signed)
Virtual Visit via Video Note  I connected with Danielle Flores on 01/16/19 at  1:15 PM EDT by a video enabled telemedicine application and verified that I am speaking with the correct person using two identifiers.  Location: Patient: home Provider: home   I discussed the limitations of evaluation and management by telemedicine and the availability of in person appointments. The patient expressed understanding and agreed to proceed.  History of Present Illness: Pt is home with c/o bp running high No cp, no sob, no palpitations    Observations/Objective: Vitals:   01/16/19 1305  BP: (!) 171/88  Pulse: 70   Pt in NAD  Assessment and Plan: 1. Essential hypertension Poorly controlled will alter medications, encouraged DASH diet, minimize caffeine and obtain adequate sleep. Report concerning symptoms and follow up as directed and as needed - lisinopril (ZESTRIL) 40 MG tablet; Take 1 tablet (40 mg total) by mouth daily.  Dispense: 90 tablet; Refill: 3   lisinopril inc to 40 mg daily--- rto 2-3 weeks  Follow Up Instructions:    I discussed the assessment and treatment plan with the patient. The patient was provided an opportunity to ask questions and all were answered. The patient agreed with the plan and demonstrated an understanding of the instructions.   The patient was advised to call back or seek an in-person evaluation if the symptoms worsen or if the condition fails to improve as anticipated.  I provided 15 minutes of non-face-to-face time during this encounter.   Ann Held, DO

## 2019-01-23 ENCOUNTER — Other Ambulatory Visit: Payer: Self-pay

## 2019-01-23 ENCOUNTER — Encounter: Payer: PRIVATE HEALTH INSURANCE | Admitting: Family Medicine

## 2019-01-23 ENCOUNTER — Encounter: Payer: Self-pay | Admitting: Family Medicine

## 2019-02-16 NOTE — Progress Notes (Signed)
Patient cancelled.  Jettie Booze, MD

## 2019-02-18 ENCOUNTER — Encounter: Payer: Self-pay | Admitting: Interventional Cardiology

## 2019-02-18 ENCOUNTER — Other Ambulatory Visit: Payer: Self-pay

## 2019-02-18 ENCOUNTER — Telehealth: Payer: PRIVATE HEALTH INSURANCE | Admitting: Interventional Cardiology

## 2019-02-18 VITALS — BP 188/121 | HR 72 | Ht 66.0 in | Wt 175.0 lb

## 2019-02-19 ENCOUNTER — Encounter: Payer: Self-pay | Admitting: Interventional Cardiology

## 2019-02-19 ENCOUNTER — Other Ambulatory Visit: Payer: Self-pay

## 2019-02-19 ENCOUNTER — Telehealth (INDEPENDENT_AMBULATORY_CARE_PROVIDER_SITE_OTHER): Payer: PRIVATE HEALTH INSURANCE | Admitting: Interventional Cardiology

## 2019-02-19 DIAGNOSIS — E1110 Type 2 diabetes mellitus with ketoacidosis without coma: Secondary | ICD-10-CM | POA: Diagnosis not present

## 2019-02-19 DIAGNOSIS — I252 Old myocardial infarction: Secondary | ICD-10-CM

## 2019-02-19 DIAGNOSIS — I119 Hypertensive heart disease without heart failure: Secondary | ICD-10-CM | POA: Diagnosis not present

## 2019-02-19 DIAGNOSIS — I2510 Atherosclerotic heart disease of native coronary artery without angina pectoris: Secondary | ICD-10-CM | POA: Diagnosis not present

## 2019-02-19 MED ORDER — AMLODIPINE BESYLATE 10 MG PO TABS: ORAL_TABLET | ORAL | 3 refills | Status: DC

## 2019-02-19 NOTE — Progress Notes (Signed)
Virtual Visit via Video Note   This visit type was conducted due to national recommendations for restrictions regarding the COVID-19 Pandemic (e.g. social distancing) in an effort to limit this patient's exposure and mitigate transmission in our community.  Due to her co-morbid illnesses, this patient is at least at moderate risk for complications without adequate follow up.  This format is felt to be most appropriate for this patient at this time.  All issues noted in this document were discussed and addressed.  A limited physical exam was performed with this format.  Please refer to the patient's chart for her consent to telehealth for [facility].   Date:  02/19/2019   ID:  Danielle Flores, DOB 11/08/1965, MRN [id number]  Patient Location: Home Provider Location: Home  PCP:  Ann Held, DO  Cardiologist:  No primary care provider on file. [place] Electrophysiologist:  None   Evaluation Performed:  Follow-Up Visit  Chief Complaint:  CAD/HTN  History of Present Illness:    Danielle Flores is a 53 y.o. female with a hx of HTN, HLD and uncontrolled DM.  "Cath in 09/2018 for NSTEMI showed:  Prox RCA to Mid RCA lesion is 25% stenosed.  Prox Cx to Mid Cx lesion is 25% stenosed.  Ost LAD lesion is 25% stenosed.  RPDA lesion is 95% stenosed.  A drug-eluting stent was successfully placed using a STENT SYNERGY DES 2.5X16.  Post intervention, there is a 0% residual stenosis.  The left ventricular ejection fraction is greater than 65% by visual estimate.  There is hyperdynamic left ventricular systolic function.  LV end diastolic pressure is normal. LVEDP 7 mm Hg.  There is no aortic valve stenosis.  Continue dual antiplatelet therapy."  At last visit: "She was laid off.  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    BP at home has been high.  It has been in the 646O-032Z systolic."  Lisinopril was increased.   DM:  COntinue healthy diet and  exercise.  Has had DKA in the past with dehydration.  Avoid diuretics at this time.   BP  The patient does not have symptoms concerning for COVID-19 infection (fever, chills, cough, or new shortness of breath).    Past Medical History:  Diagnosis Date  . Hyperglycemia   . Hyperlipidemia   . Hypertension    Past Surgical History:  Procedure Laterality Date  . CESAREAN SECTION    . CORONARY STENT INTERVENTION N/A 09/16/2018   Procedure: CORONARY STENT INTERVENTION;  Surgeon: Jettie Booze, MD;  Location: [facility];  Service: Cardiovascular;  Laterality: N/A;  . LEFT HEART CATH AND CORONARY ANGIOGRAPHY N/A 09/16/2018   Procedure: LEFT HEART CATH AND CORONARY ANGIOGRAPHY;  Surgeon: Jettie Booze, MD;  Location: [facility];  Service: Cardiovascular;  Laterality: N/A;     Current Meds  Medication Sig  . aspirin EC 81 MG EC tablet Take 1 tablet (81 mg total) by mouth daily with breakfast.  . atorvastatin (LIPITOR) 80 MG tablet Take 1 tablet (80 mg total) by mouth every evening.  . blood glucose meter kit and supplies KIT Dispense based on patient and insurance preference. Use up to four times daily as directed. (FOR ICD-9 250.00, 250.01).  . blood glucose meter kit and supplies Relion Prime or Dispense other brand based on patient and insurance preference. Use up to four times daily as directed. (FOR ICD-9 250.00, 250.01).  Marland Kitchen lisinopril (ZESTRIL) 40 MG tablet Take  1 tablet (40 mg total) by mouth daily. (Patient taking differently: Take 40 mg by mouth 2 (two) times a day. )  . metFORMIN (GLUCOPHAGE) 1000 MG tablet Take 1 tablet (1,000 mg total) by mouth 2 (two) times daily with a meal.  . Metoprolol Tartrate 75 MG TABS Take 1.5 tablets by mouth 2 (two) times a day. (Patient taking differently: Take 1.5 tablets by mouth 3 (three) times daily. )  . OVER THE COUNTER MEDICATION Take 1 tablet by mouth daily. amberen for menopause  . ticagrelor (BRILINTA) 90 MG TABS  tablet Take 90 mg by mouth 2 (two) times daily.     Allergies:   Patient has no known allergies.   Social History   Tobacco Use  . Smoking status: Never Smoker  . Smokeless tobacco: Never Used  Substance Use Topics  . Alcohol use: Yes  . Drug use: No     Family Hx: The patient's family history includes Diabetes in an other family member; Heart disease in her maternal grandmother; Sickle cell anemia in her paternal aunt.  ROS:   Please see the history of present illness.    Improved lifestyle, but frustration over BP results.  All other systems reviewed and are negative.   Prior CV studies:   The following studies were reviewed today:  Labs reviewed  Labs/Other Tests and Data Reviewed:    EKG:  09/2018 NSR, LVH  Recent Labs: 09/13/2018: ALT 19; Magnesium 3.0 09/17/2018: Hemoglobin 10.5; Platelets 130 12/20/2018: BUN 13; Creatinine, Ser 0.89; Potassium 3.7; Sodium 144   Recent Lipid Panel Lab Results  Component Value Date/Time   CHOL 167 08/10/2017 08:47 AM   TRIG 117.0 08/10/2017 08:47 AM   HDL 31.80 (L) 08/10/2017 08:47 AM   CHOLHDL 5 08/10/2017 08:47 AM   LDLCALC 112 (H) 08/10/2017 08:47 AM    Wt Readings from Last 3 Encounters:  02/19/19 175 lb (79.4 kg)  02/18/19 175 lb (79.4 kg)  12/05/18 170 lb (77.1 kg)     Objective:    Vital Signs:  BP (!) 229/138   Pulse 69   Ht '5\' 6"'  (1.676 m)   Wt 175 lb (79.4 kg)   LMP  (LMP Unknown)   BMI 28.25 kg/m    VITAL SIGNS:  reviewed GEN:  no acute distress RESPIRATORY:  normal respiratory effort, symmetric expansion PSYCH:  normal affect exam limited  ASSESSMENT & PLAN:    1. CAD/Old MI: No angina. COntinue aggressive secondary prevention.  WIll need lipids rechecked.  LDL was above target.  She started red yeast rice.  Need to see LFTs.  COnsider Zetia if LDL still above 70.  2. HTN: Readings are still high.  WOuld add amlodipine. Start at 5 mg daily for 3 days , then increase to 10 mg if BP not controlled.   If BP still high after 1 week, will switch metoprolol to Carvedilol 25 mg BID. If amlodipine works for BP, could consider weaning off metoprolol.  3. DM: Much improved daily sugars.  Off insulin.   COVID-19 Education: The signs and symptoms of COVID-19 were discussed with the patient and how to seek care for testing (follow up with PCP or arrange E-visit).  The importance of social distancing was discussed today.  Time:   Today, I have spent 25 minutes with the patient with telehealth technology discussing the above problems.     Medication Adjustments/Labs and Tests Ordered: Current medicines are reviewed at length with the patient today.  Concerns regarding medicines  are outlined above.   Tests Ordered: No orders of the defined types were placed in this encounter.   Medication Changes: No orders of the defined types were placed in this encounter.   Follow Up:  Virtual Visit in 3 month(s)  Signed, Larae Grooms, MD  02/19/2019 12:41 PM    [place]

## 2019-02-19 NOTE — Patient Instructions (Signed)
Medication Instructions:  Your physician has recommended you make the following change in your medication:   START: amlodipine 10 mg tablet: Take 1/2 tablet (5 mg) for 3 days, then increase to 1 tablet (10 mg) once a day   Lab work: None Ordered  If you have labs (blood work) drawn today and your tests are completely normal, you will receive your results only by: Marland Kitchen MyChart Message (if you have MyChart) OR . A paper copy in the mail If you have any lab test that is abnormal or we need to change your treatment, we will call you to review the results.  Testing/Procedures: None ordered  Follow-Up: . None arranged at this time . Call in 1 week to report Blood Pressure readings  Any Other Special Instructions Will Be Listed Below (If Applicable).

## 2019-02-20 NOTE — Telephone Encounter (Signed)
Patient's appt changed to 7/15

## 2019-02-24 ENCOUNTER — Telehealth: Payer: PRIVATE HEALTH INSURANCE | Admitting: Interventional Cardiology

## 2019-03-19 ENCOUNTER — Telehealth: Payer: Self-pay | Admitting: Family Medicine

## 2019-03-19 NOTE — Telephone Encounter (Signed)
Medication Refill - Medication:  ticagrelor (BRILINTA) 90 MG TABS tablet   Has the patient contacted their pharmacy? Yes advised to call.   Preferred Pharmacy (with phone number or street name):  [facility]  Phone [phone]  Agent: Please be advised that RX refills may take up to 3 business days. We ask that you follow-up with your pharmacy.

## 2019-03-19 NOTE — Telephone Encounter (Signed)
Please advise if okay to refill Brilinta.

## 2019-03-20 NOTE — Telephone Encounter (Signed)
Cardiology does that

## 2019-03-25 ENCOUNTER — Telehealth: Payer: Self-pay | Admitting: Interventional Cardiology

## 2019-03-25 NOTE — Telephone Encounter (Signed)
**Note De-Identified Aydrian Halpin Obfuscation** We have completed the provider part of a [place] [facility] [place] pt asst application (which includes the Brilinta prescription), Dr Marlou Porch (DOD) has signed it and we faxed it to [facility] and [place] with the pts ID written on the cover sheet.Marland Kitchen

## 2019-03-25 NOTE — Telephone Encounter (Signed)
Left message on machine to call cardiology

## 2019-03-25 NOTE — Telephone Encounter (Signed)
**Note De-Identified [name] Obfuscation** I called the pt back and she states that she applied for Pt Asst with [place] [facility] [place] through her PCP. She is requesting that we take it over and call to order her next shipment of Brilinta now.  I called [place] [facility] [place] and s/w Tammy who advised that the pts ID # is [id number] and that she was approved for pt asst with them until March 2021.  Per Tammy they need a Brilinta RX faxed to them at [phone] with the pts ID written on the cover letter and the RX.

## 2019-03-25 NOTE — Telephone Encounter (Signed)
New Message     *STAT* If patient is at the pharmacy, call can be transferred to refill team.   1. Which medications need to be refilled? (please list name of each medication and dose if known) Brillinta  90mg    2. Which pharmacy/location (including street and city if local pharmacy) is medication to be sent to? [place] [phone]  3. Do they need a 30 day or 90 day supply? 90 day

## 2019-04-10 LAB — HM MAMMOGRAPHY

## 2019-05-12 ENCOUNTER — Other Ambulatory Visit: Payer: Self-pay | Admitting: Family Medicine

## 2019-05-12 ENCOUNTER — Telehealth: Payer: Self-pay | Admitting: Interventional Cardiology

## 2019-05-12 NOTE — Telephone Encounter (Signed)
Pt calling requesting a refillon metoprolol. Pt's PCP has been refilling this medication. Dr. Irish Lack has never refill this medication. Would Dr. Irish Lack like to refill this medication? Please address

## 2019-05-12 NOTE — Telephone Encounter (Signed)
The patient stated she took her last tablet of her Metoprolol Tartrate 75 MG TABS medication this morning.

## 2019-05-12 NOTE — Telephone Encounter (Signed)
Requested medication (s) are due for refill today: yes  Requested medication (s) are on the active medication list: yes  Last refill:  01/01/2019  Future visit scheduled: no  Notes to clinic:  Review for refill   Requested Prescriptions  Pending Prescriptions Disp Refills   Metoprolol Tartrate 75 MG TABS 270 tablet 1    Sig: Take 1.5 tablets by mouth.     Cardiovascular:  Beta Blockers Failed - 05/12/2019  8:32 AM      Failed - Last BP in normal range    BP Readings from Last 1 Encounters:  02/19/19 (!) 229/138         Passed - Last Heart Rate in normal range    Pulse Readings from Last 1 Encounters:  02/19/19 69         Passed - Valid encounter within last 6 months    Recent Outpatient Visits          3 months ago    [facility] [place], DO   3 months ago Essential hypertension   [facility] [place], DO   7 months ago DM (diabetes mellitus) type II uncontrolled, periph vascular disorder ([place])   [facility] [place], DO   1 year ago DM (diabetes mellitus) type II uncontrolled, periph vascular disorder ([place])   [facility] [place], DO   1 year ago Lower urinary tract symptoms   [facility] at [place], [place]

## 2019-05-12 NOTE — Telephone Encounter (Signed)
Medication Refill - Medication:  Metoprolol Tartrate 75 MG TABS   Has the patient contacted their pharmacy?  Yes advised to call. Pt is on last pill.   Preferred Pharmacy (with phone number or street name):  [id number] - [place], [place] - [address] [phone] (Phone) [phone] (Fax)   Agent: Please be advised that RX refills may take up to 3 business days. We ask that you follow-up with your pharmacy.

## 2019-05-12 NOTE — Telephone Encounter (Signed)
Requested medications are due for refill today?  Yes  Requested medications are on the active medication list?  Yes  Last refill - 01/02/2019, #270, 1 refill  Future visit scheduled?  No  Notes to clinic - Directions are for twice daily, patient reported taking three times daily.  Requested Prescriptions  Pending Prescriptions Disp Refills   Metoprolol Tartrate 75 MG TABS 270 tablet 1    Sig: Take 1.5 tablets by mouth.     Cardiovascular:  Beta Blockers Failed - 05/12/2019  4:29 PM      Failed - Last BP in normal range    BP Readings from Last 1 Encounters:  02/19/19 (!) 229/138         Passed - Last Heart Rate in normal range    Pulse Readings from Last 1 Encounters:  02/19/19 69         Passed - Valid encounter within last 6 months    Recent Outpatient Visits          3 months ago    [facility] at [place], DO   3 months ago Essential hypertension   [facility] [place], DO   7 months ago DM (diabetes mellitus) type II uncontrolled, periph vascular disorder ([place])   [facility] at [place], DO   1 year ago DM (diabetes mellitus) type II uncontrolled, periph vascular disorder ([place])   [facility] at [place], DO   1 year ago Lower urinary tract symptoms   [facility] at [place], [place]

## 2019-05-13 MED ORDER — METOPROLOL TARTRATE 75 MG PO TABS: 1.5000 | ORAL_TABLET | Freq: Three times a day (TID) | ORAL | 1 refills | Status: DC

## 2019-05-13 MED ORDER — TICAGRELOR 90 MG PO TABS: 90.0000 mg | ORAL_TABLET | Freq: Two times a day (BID) | ORAL | 1 refills | Status: DC

## 2019-05-13 NOTE — Telephone Encounter (Signed)
OK to refill

## 2019-05-13 NOTE — Telephone Encounter (Signed)
Called and spoke to patient. Patient's PCP has already sent in an Rx for her metoprolol.   Patient is asking about a refill for her Brilinta at [facility] and [place]. Called and spoke to [place] at [place] [facility] [place]. She states that the patient has 1 refill left for her Brilinta set to be sent out on 10/25. Butch Penny states that she is approved through February 2021 and that I can fax her refill to [phone]. Refill faxed and confirmation received that fax was successful.

## 2019-06-09 ENCOUNTER — Telehealth: Payer: Self-pay | Admitting: Interventional Cardiology

## 2019-06-09 ENCOUNTER — Telehealth: Payer: Self-pay | Admitting: *Deleted

## 2019-06-09 MED ORDER — METFORMIN HCL 500 MG PO TABS: 1000.0000 mg | ORAL_TABLET | Freq: Two times a day (BID) | ORAL | 0 refills | Status: DC

## 2019-06-09 NOTE — Telephone Encounter (Signed)
Pharmacy sent fax stating patient cannot swallow the 1000mg  tabs and is asking for the 500mg  tab.    Rx sent for 500mg  2tabs bid.

## 2019-06-09 NOTE — Telephone Encounter (Signed)
Pt calling stating that she needs a refill on Brilinta sent to [facility]. I see [place], RN, that you stated that you faxed this to this company and that pt stated that they told her that she does not have anymore refills. Please address

## 2019-06-09 NOTE — Telephone Encounter (Signed)
Called and spoke to [place] at [employer]. She states that the patient's RX was sent to [facility] [place]. She states that they received the RX for Brilinta 90 mg tablets  #180 with 3 refills sent on 05/13/19 and the next delivery will be in January 2021. Called and spoke to patient. She states that she received the shipment from [place] and wanted to make sure that she had refills on file. Made patient aware that Yves Dill stated that her next delivery would be in January 2021. Instructed for patient to let us know if she had any further issues.

## 2019-06-09 NOTE — Telephone Encounter (Signed)
New Message   *STAT* If patient is at the pharmacy, call can be transferred to refill team.   1. Which medications need to be refilled? (please list name of each medication and dose if known) ticagrelor (BRILINTA) 90 MG TABS tablet  2. Which pharmacy/location (including street and city if local pharmacy) is medication to be sent to? [place] [address] [phone]     [phone]  3. Do they need a 30 day or 90 day supply? 90 day

## 2019-07-23 NOTE — Progress Notes (Signed)
This encounter was created in error - please disregard.  This encounter was created in error - please disregard.

## 2019-08-08 DIAGNOSIS — I2510 Atherosclerotic heart disease of native coronary artery without angina pectoris: Secondary | ICD-10-CM

## 2019-08-08 HISTORY — DX: Atherosclerotic heart disease of native coronary artery without angina pectoris: I25.10

## 2019-09-08 ENCOUNTER — Encounter: Payer: Self-pay | Admitting: Family Medicine

## 2019-09-09 ENCOUNTER — Encounter: Payer: Self-pay | Admitting: Family Medicine

## 2019-09-09 ENCOUNTER — Other Ambulatory Visit: Payer: Self-pay | Admitting: Family Medicine

## 2019-09-09 DIAGNOSIS — Z1211 Encounter for screening for malignant neoplasm of colon: Secondary | ICD-10-CM

## 2019-09-10 ENCOUNTER — Encounter: Payer: Self-pay | Admitting: Family Medicine

## 2019-09-26 ENCOUNTER — Other Ambulatory Visit: Payer: Self-pay | Admitting: Interventional Cardiology

## 2019-09-26 DIAGNOSIS — I10 Essential (primary) hypertension: Secondary | ICD-10-CM

## 2019-09-26 MED ORDER — LISINOPRIL 40 MG PO TABS: 40.0000 mg | ORAL_TABLET | Freq: Every day | ORAL | 1 refills | Status: DC

## 2019-10-01 ENCOUNTER — Other Ambulatory Visit: Payer: Self-pay | Admitting: *Deleted

## 2019-10-01 MED ORDER — METOPROLOL TARTRATE 75 MG PO TABS: 1.5000 | ORAL_TABLET | Freq: Three times a day (TID) | ORAL | 1 refills | Status: DC

## 2019-10-07 ENCOUNTER — Other Ambulatory Visit: Payer: Self-pay | Admitting: *Deleted

## 2019-10-07 MED ORDER — METFORMIN HCL 500 MG PO TABS: 1000.0000 mg | ORAL_TABLET | Freq: Two times a day (BID) | ORAL | 0 refills | Status: DC

## 2019-10-14 ENCOUNTER — Encounter: Payer: PRIVATE HEALTH INSURANCE | Admitting: Family Medicine

## 2019-10-16 ENCOUNTER — Encounter: Payer: PRIVATE HEALTH INSURANCE | Admitting: Family Medicine

## 2019-10-20 ENCOUNTER — Other Ambulatory Visit: Payer: Self-pay

## 2019-10-21 ENCOUNTER — Other Ambulatory Visit: Payer: Self-pay

## 2019-10-21 ENCOUNTER — Encounter: Payer: Self-pay | Admitting: Family Medicine

## 2019-10-21 ENCOUNTER — Ambulatory Visit (INDEPENDENT_AMBULATORY_CARE_PROVIDER_SITE_OTHER): Payer: PRIVATE HEALTH INSURANCE | Admitting: Family Medicine

## 2019-10-21 VITALS — BP 150/100 | HR 75 | Temp 97.7°F | Resp 18 | Ht 66.0 in | Wt 204.6 lb

## 2019-10-21 DIAGNOSIS — E1165 Type 2 diabetes mellitus with hyperglycemia: Secondary | ICD-10-CM

## 2019-10-21 DIAGNOSIS — Z1211 Encounter for screening for malignant neoplasm of colon: Secondary | ICD-10-CM

## 2019-10-21 DIAGNOSIS — Z1159 Encounter for screening for other viral diseases: Secondary | ICD-10-CM | POA: Diagnosis not present

## 2019-10-21 DIAGNOSIS — R809 Proteinuria, unspecified: Secondary | ICD-10-CM | POA: Diagnosis not present

## 2019-10-21 DIAGNOSIS — I10 Essential (primary) hypertension: Secondary | ICD-10-CM

## 2019-10-21 DIAGNOSIS — Z0000 Encounter for general adult medical examination without abnormal findings: Secondary | ICD-10-CM

## 2019-10-21 MED ORDER — METOPROLOL TARTRATE 75 MG PO TABS: 1.5000 | ORAL_TABLET | Freq: Three times a day (TID) | ORAL | 1 refills | Status: DC

## 2019-10-21 MED ORDER — METOPROLOL TARTRATE 100 MG PO TABS: 100.0000 mg | ORAL_TABLET | Freq: Three times a day (TID) | ORAL | 3 refills | Status: DC

## 2019-10-21 NOTE — Patient Instructions (Signed)

## 2019-10-21 NOTE — Progress Notes (Signed)
Subjective:     Danielle Flores is a 54 y.o. female and is here for a comprehensive physical exam. The patient reports no problems.  HYPERTENSION   Blood pressure range-not checking   Chest pain- no      Dyspnea- no Lightheadedness- no   Edema- no  Other side effects - no   Medication compliance: good Low salt diet- yes     DIABETES    Blood Sugar ranges-77-144  Polyuria- no New Visual problems- no  Hypoglycemic symptoms- no  Other side effects-no Medication compliance - good Last eye exam- due Foot exam- today   HYPERLIPIDEMIA  Medication compliance- good RUQ pain- no  Muscle aches- no Other side effects-no     Social History   Socioeconomic History  . Marital status: Married    Spouse name: Not on file  . Number of children: Not on file  . Years of education: Not on file  . Highest education level: Not on file  Occupational History  . Occupation: cleans houses  Tobacco Use  . Smoking status: Never Smoker  . Smokeless tobacco: Never Used  Substance and Sexual Activity  . Alcohol use: Yes  . Drug use: No  . Sexual activity: Not on file  Other Topics Concern  . Not on file  Social History Narrative  . Not on file   Social Determinants of Health   Financial Resource Strain:   . Difficulty of Paying Living Expenses:   Food Insecurity:   . Worried About Charity fundraiser in the Last Year:   . Arboriculturist in the Last Year:   Transportation Needs:   . Film/video editor (Medical):   Marland Kitchen Lack of Transportation (Non-Medical):   Physical Activity:   . Days of Exercise per Week:   . Minutes of Exercise per Session:   Stress:   . Feeling of Stress :   Social Connections:   . Frequency of Communication with Friends and Family:   . Frequency of Social Gatherings with Friends and Family:   . Attends Religious Services:   . Active Member of Clubs or Organizations:   . Attends Archivist Meetings:   Marland Kitchen Marital Status:   Intimate Partner  Violence:   . Fear of Current or Ex-Partner:   . Emotionally Abused:   Marland Kitchen Physically Abused:   . Sexually Abused:    Health Maintenance  Topic Date Due  . Hepatitis C Screening  Never done  . OPHTHALMOLOGY EXAM  Never done  . PAP SMEAR-Modifier  08/23/2013  . COLONOSCOPY  Never done  . INFLUENZA VACCINE  03/08/2019  . HEMOGLOBIN A1C  03/14/2019  . PNEUMOCOCCAL POLYSACCHARIDE VACCINE AGE 33-64 HIGH RISK  10/20/2020 (Originally 09/12/1967)  . MAMMOGRAM  04/09/2020  . TETANUS/TDAP  07/20/2020  . FOOT EXAM  10/20/2020  . HIV Screening  Completed    The following portions of the patient's history were reviewed and updated as appropriate:  She  has a past medical history of Hyperglycemia, Hyperlipidemia, and Hypertension. She does not have any pertinent problems on file. She  has a past surgical history that includes Cesarean section; LEFT HEART CATH AND CORONARY ANGIOGRAPHY (N/A, 09/16/2018); and CORONARY STENT INTERVENTION (N/A, 09/16/2018). Her family history includes Diabetes in an other family member; Heart disease in her maternal grandmother; Sickle cell anemia in her paternal aunt. She  reports that she has never smoked. She has never used smokeless tobacco. She reports current alcohol use. She reports that she  does not use drugs. She has a current medication list which includes the following prescription(s): amlodipine, aspirin, blood glucose meter kit and supplies, blood glucose meter kit and supplies, lisinopril, metformin, ticagrelor, and metoprolol tartrate. Current Outpatient Medications on File Prior to Visit  Medication Sig Dispense Refill  . amLODipine (NORVASC) 10 MG tablet Take 1/2 tablet (5 mg) for 3 days, then increase to 1 tablet (10 mg) once a day 180 tablet 3  . aspirin EC 81 MG EC tablet Take 1 tablet (81 mg total) by mouth daily with breakfast. 30 tablet 11  . blood glucose meter kit and supplies KIT Dispense based on patient and insurance preference. Use up to four times  daily as directed. (FOR ICD-9 250.00, 250.01). 1 each 0  . blood glucose meter kit and supplies Relion Prime or Dispense other brand based on patient and insurance preference. Use up to four times daily as directed. (FOR ICD-9 250.00, 250.01). 1 each 11  . lisinopril (ZESTRIL) 40 MG tablet Take 1 tablet (40 mg total) by mouth daily. 90 tablet 1  . metFORMIN (GLUCOPHAGE) 500 MG tablet Take 2 tablets (1,000 mg total) by mouth 2 (two) times daily with a meal. 360 tablet 0  . ticagrelor (BRILINTA) 90 MG TABS tablet Take 1 tablet (90 mg total) by mouth 2 (two) times daily. 180 tablet 1   No current facility-administered medications on file prior to visit.   She has No Known Allergies..  Review of Systems Review of Systems  Constitutional: Negative for activity change, appetite change and fatigue.  HENT: Negative for hearing loss, congestion, tinnitus and ear discharge.  dentist q79mEyes: Negative for visual disturbance (see optho q1y -- vision corrected to 20/20 with glasses).  Respiratory: Negative for cough, chest tightness and shortness of breath.   Cardiovascular: Negative for chest pain, palpitations and leg swelling.  Gastrointestinal: Negative for abdominal pain, diarrhea, constipation and abdominal distention.  Genitourinary: Negative for urgency, frequency, decreased urine volume and difficulty urinating.  Musculoskeletal: Negative for back pain, arthralgias and gait problem.  Skin: Negative for color change, pallor and rash.  Neurological: Negative for dizziness, light-headedness, numbness and headaches.  Hematological: Negative for adenopathy. Does not bruise/bleed easily.  Psychiatric/Behavioral: Negative for suicidal ideas, confusion, sleep disturbance, self-injury, dysphoric mood, decreased concentration and agitation.       Objective:    BP (!) 150/100   Pulse 75   Temp 97.7 F (36.5 C) (Temporal)   Resp 18   Ht '5\' 6"'  (1.676 m)   Wt 204 lb 9.6 oz (92.8 kg)   LMP  (LMP  Unknown)   SpO2 98%   BMI 33.02 kg/m  General appearance: alert, cooperative and no distress Head: Normocephalic, without obvious abnormality, atraumatic Eyes: conjunctivae/corneas clear. PERRL, EOM's intact. Fundi benign. Ears: normal TM's and external ear canals both ears Neck: no adenopathy, no carotid bruit, no JVD, supple, symmetrical, trachea midline and thyroid not enlarged, symmetric, no tenderness/mass/nodules Back: symmetric, no curvature. ROM normal. No CVA tenderness. Lungs: clear to auscultation bilaterally Breasts: normal appearance, no masses or tenderness Heart: regular rate and rhythm, S1, S2 normal, no murmur, click, rub or gallop Abdomen: soft, non-tender; bowel sounds normal; no masses,  no organomegaly Pelvic: deferred Extremities: extremities normal, atraumatic, no cyanosis or edema Pulses: 2+ and symmetric Skin: Skin color, texture, turgor normal. No rashes or lesions Lymph nodes: Cervical, supraclavicular, and axillary nodes normal. Neurologic: Alert and oriented X 3, normal strength and tone. Normal symmetric reflexes. Normal coordination and gait  Diabetic Foot Exam - Simple   Simple Foot Form Diabetic Foot exam was performed with the following findings: Yes 10/21/2019  2:30 PM  Visual Inspection No deformities, no ulcerations, no other skin breakdown bilaterally: Yes Sensation Testing Intact to touch and monofilament testing bilaterally: Yes Pulse Check Posterior Tibialis and Dorsalis pulse intact bilaterally: Yes Comments     Assessment:    Healthy female exam.      Plan:    ghm utd  Check labs  See After Visit Summary for Counseling Recommendations    1. Type 2 diabetes mellitus with hyperglycemia, without long-term current use of insulin (HCC) Check labs today . - Metoprolol Tartrate 75 MG TABS; Take 1.5 tablets by mouth 3 (three) times daily.  Dispense: 270 tablet; Refill: 1 - Lipid panel - CBC with Differential/Platelet -  Comprehensive metabolic panel - TSH - Microalbumin / creatinine urine ratio  2. Essential hypertension  - Lipid panel - CBC with Differential/Platelet - Comprehensive metabolic panel - TSH - Microalbumin / creatinine urine ratio  3. Preventative health care See above  - Lipid panel - CBC with Differential/Platelet - Comprehensive metabolic panel - TSH - Microalbumin / creatinine urine ratio  4. Need for hepatitis C screening test  - Hepatitis C antibody  5. Colon cancer screenin  - Ambulatory referral to Gastroenterology

## 2019-10-22 ENCOUNTER — Other Ambulatory Visit (INDEPENDENT_AMBULATORY_CARE_PROVIDER_SITE_OTHER): Payer: PRIVATE HEALTH INSURANCE

## 2019-10-22 DIAGNOSIS — E1165 Type 2 diabetes mellitus with hyperglycemia: Secondary | ICD-10-CM

## 2019-10-22 DIAGNOSIS — E1110 Type 2 diabetes mellitus with ketoacidosis without coma: Secondary | ICD-10-CM | POA: Diagnosis not present

## 2019-10-22 DIAGNOSIS — E1151 Type 2 diabetes mellitus with diabetic peripheral angiopathy without gangrene: Secondary | ICD-10-CM | POA: Diagnosis not present

## 2019-10-22 DIAGNOSIS — IMO0002 Reserved for concepts with insufficient information to code with codable children: Secondary | ICD-10-CM

## 2019-10-22 LAB — CBC WITH DIFFERENTIAL/PLATELET
Basophils Absolute: 0.1 10*3/uL (ref 0.0–0.1)
Basophils Relative: 1.3 % (ref 0.0–3.0)
Eosinophils Absolute: 0.1 10*3/uL (ref 0.0–0.7)
Eosinophils Relative: 1.9 % (ref 0.0–5.0)
HCT: 41.6 % (ref 36.0–46.0)
Hemoglobin: 13.5 g/dL (ref 12.0–15.0)
Lymphocytes Relative: 32.3 % (ref 12.0–46.0)
Lymphs Abs: 2.3 10*3/uL (ref 0.7–4.0)
MCHC: 32.4 g/dL (ref 30.0–36.0)
MCV: 75.5 fl — ABNORMAL LOW (ref 78.0–100.0)
Monocytes Absolute: 0.5 10*3/uL (ref 0.1–1.0)
Monocytes Relative: 7.4 % (ref 3.0–12.0)
Neutro Abs: 4 10*3/uL (ref 1.4–7.7)
Neutrophils Relative %: 57.1 % (ref 43.0–77.0)
Platelets: 287 10*3/uL (ref 150.0–400.0)
RBC: 5.51 Mil/uL — ABNORMAL HIGH (ref 3.87–5.11)
RDW: 15.7 % — ABNORMAL HIGH (ref 11.5–15.5)
WBC: 7 10*3/uL (ref 4.0–10.5)

## 2019-10-22 LAB — COMPREHENSIVE METABOLIC PANEL
ALT: 46 U/L — ABNORMAL HIGH (ref 0–35)
AST: 32 U/L (ref 0–37)
Albumin: 4.5 g/dL (ref 3.5–5.2)
Alkaline Phosphatase: 102 U/L (ref 39–117)
BUN: 13 mg/dL (ref 6–23)
CO2: 27 mEq/L (ref 19–32)
Calcium: 10.4 mg/dL (ref 8.4–10.5)
Chloride: 102 mEq/L (ref 96–112)
Creatinine, Ser: 0.86 mg/dL (ref 0.40–1.20)
GFR: 82.56 mL/min (ref >60.00)
Glucose, Bld: 122 mg/dL — ABNORMAL HIGH (ref 70–99)
Potassium: 4.1 mEq/L (ref 3.5–5.1)
Sodium: 140 mEq/L (ref 135–145)
Total Bilirubin: 0.5 mg/dL (ref 0.2–1.2)
Total Protein: 7.9 g/dL (ref 6.0–8.3)

## 2019-10-22 LAB — LIPID PANEL
Cholesterol: 250 mg/dL — ABNORMAL HIGH (ref 0–200)
HDL: 42.5 mg/dL (ref >39.00)
LDL Cholesterol: 178 mg/dL — ABNORMAL HIGH (ref 0–99)
NonHDL: 207.65
Total CHOL/HDL Ratio: 6
Triglycerides: 146 mg/dL (ref 0.0–149.0)
VLDL: 29.2 mg/dL (ref 0.0–40.0)

## 2019-10-22 LAB — HEPATITIS C ANTIBODY
Hepatitis C Ab: NONREACTIVE
SIGNAL TO CUT-OFF: 0.02 (ref <1.00)

## 2019-10-22 LAB — MICROALBUMIN / CREATININE URINE RATIO
Creatinine,U: 99.3 mg/dL
Microalb Creat Ratio: 31 mg/g — ABNORMAL HIGH (ref 0.0–30.0)
Microalb, Ur: 30.8 mg/dL — ABNORMAL HIGH (ref 0.0–1.9)

## 2019-10-22 LAB — TSH: TSH: 1.66 u[IU]/mL (ref 0.35–4.50)

## 2019-10-23 ENCOUNTER — Other Ambulatory Visit: Payer: Self-pay | Admitting: Family Medicine

## 2019-10-23 ENCOUNTER — Other Ambulatory Visit: Payer: Self-pay | Admitting: Emergency Medicine

## 2019-10-23 DIAGNOSIS — E1110 Type 2 diabetes mellitus with ketoacidosis without coma: Secondary | ICD-10-CM

## 2019-10-23 DIAGNOSIS — I10 Essential (primary) hypertension: Secondary | ICD-10-CM

## 2019-10-23 DIAGNOSIS — IMO0002 Reserved for concepts with insufficient information to code with codable children: Secondary | ICD-10-CM

## 2019-10-23 DIAGNOSIS — E1151 Type 2 diabetes mellitus with diabetic peripheral angiopathy without gangrene: Secondary | ICD-10-CM

## 2019-10-23 DIAGNOSIS — E1165 Type 2 diabetes mellitus with hyperglycemia: Secondary | ICD-10-CM

## 2019-10-23 LAB — HEMOGLOBIN A1C: Hgb A1c MFr Bld: 7.5 % — ABNORMAL HIGH (ref 4.6–6.5)

## 2019-10-23 MED ORDER — SITAGLIPTIN PHOSPHATE 100 MG PO TABS: 100.0000 mg | ORAL_TABLET | Freq: Every day | ORAL | 2 refills | Status: DC

## 2019-10-23 NOTE — Addendum Note (Signed)
Addended byConrad [place] D on: 10/23/2019 03:18 PM   Modules accepted: Orders

## 2019-10-27 ENCOUNTER — Telehealth: Payer: Self-pay | Admitting: Interventional Cardiology

## 2019-10-27 MED ORDER — TICAGRELOR 90 MG PO TABS: 90.0000 mg | ORAL_TABLET | Freq: Two times a day (BID) | ORAL | 1 refills | Status: DC

## 2019-10-27 NOTE — Telephone Encounter (Signed)
Pt's Rx for Brilinta was faxed to [facility] at [phone] and [phone]. Confirmation for both fax.

## 2019-10-27 NOTE — Telephone Encounter (Signed)
*  STAT* If patient is at the pharmacy, call can be transferred to refill team.   1. Which medications need to be refilled? (please list name of each medication and dose if known) [facility]  2. Which pharmacy/location (including street and city if local pharmacy) is medication to be sent to? ticagrelor (BRILINTA) 90 MG TABS tablet   3. Do they need a 30 day or 90 day supply? 90 day supply

## 2019-11-18 ENCOUNTER — Telehealth: Payer: Self-pay | Admitting: Interventional Cardiology

## 2019-11-18 NOTE — Telephone Encounter (Signed)
*  STAT* If patient is at the pharmacy, call can be transferred to refill team.   1. Which medications need to be refilled? (please list name of each medication and dose if known)  ticagrelor (BRILINTA) 90 MG TABS tablet  2. Which pharmacy/location (including street and city if local pharmacy) is medication to be sent to? [facility]  3. Do they need a 30 day or 90 day supply? 90 day supply

## 2019-11-18 NOTE — Telephone Encounter (Signed)
Called [facility] at [phone] and they informed me that they have the Rx that was sent on 10/27/19 for Brilinta, but pt needed to renew her patient assistant. I called pt to informed her that [facility] has her Rx for Brilinta, but they are unable to refill her Rx for Brilinta because her patient assistant expired on 10-22-19 and that she needed to give them a call to renew it. I advised pt that if she has any other problems, questions or concerns, to give our office a call back. Pt verbalized understanding.

## 2020-01-08 ENCOUNTER — Encounter: Payer: Self-pay | Admitting: Family Medicine

## 2020-01-08 ENCOUNTER — Other Ambulatory Visit: Payer: Self-pay

## 2020-01-08 ENCOUNTER — Ambulatory Visit (INDEPENDENT_AMBULATORY_CARE_PROVIDER_SITE_OTHER): Payer: PRIVATE HEALTH INSURANCE | Admitting: Family Medicine

## 2020-01-08 VITALS — BP 142/90 | HR 83 | Temp 97.5°F | Resp 18 | Ht 66.0 in | Wt 207.0 lb

## 2020-01-08 DIAGNOSIS — E1165 Type 2 diabetes mellitus with hyperglycemia: Secondary | ICD-10-CM

## 2020-01-08 DIAGNOSIS — IMO0002 Reserved for concepts with insufficient information to code with codable children: Secondary | ICD-10-CM

## 2020-01-08 DIAGNOSIS — I10 Essential (primary) hypertension: Secondary | ICD-10-CM

## 2020-01-08 DIAGNOSIS — E1151 Type 2 diabetes mellitus with diabetic peripheral angiopathy without gangrene: Secondary | ICD-10-CM | POA: Diagnosis not present

## 2020-01-08 DIAGNOSIS — E785 Hyperlipidemia, unspecified: Secondary | ICD-10-CM | POA: Diagnosis not present

## 2020-01-08 LAB — LIPID PANEL
Cholesterol: 203 mg/dL — ABNORMAL HIGH (ref 0–200)
HDL: 41.4 mg/dL (ref >39.00)
NonHDL: 161.87
Total CHOL/HDL Ratio: 5
Triglycerides: 206 mg/dL — ABNORMAL HIGH (ref 0.0–149.0)
VLDL: 41.2 mg/dL — ABNORMAL HIGH (ref 0.0–40.0)

## 2020-01-08 LAB — COMPREHENSIVE METABOLIC PANEL
ALT: 45 U/L — ABNORMAL HIGH (ref 0–35)
AST: 28 U/L (ref 0–37)
Albumin: 4.4 g/dL (ref 3.5–5.2)
Alkaline Phosphatase: 105 U/L (ref 39–117)
BUN: 15 mg/dL (ref 6–23)
CO2: 25 mEq/L (ref 19–32)
Calcium: 9.8 mg/dL (ref 8.4–10.5)
Chloride: 106 mEq/L (ref 96–112)
Creatinine, Ser: 1.03 mg/dL (ref 0.40–1.20)
GFR: 66.99 mL/min (ref >60.00)
Glucose, Bld: 106 mg/dL — ABNORMAL HIGH (ref 70–99)
Potassium: 4.7 mEq/L (ref 3.5–5.1)
Sodium: 140 mEq/L (ref 135–145)
Total Bilirubin: 0.3 mg/dL (ref 0.2–1.2)
Total Protein: 7.2 g/dL (ref 6.0–8.3)

## 2020-01-08 LAB — HEMOGLOBIN A1C: Hgb A1c MFr Bld: 7.6 % — ABNORMAL HIGH (ref 4.6–6.5)

## 2020-01-08 LAB — LDL CHOLESTEROL, DIRECT: Direct LDL: 131 mg/dL

## 2020-01-08 MED ORDER — METFORMIN HCL 500 MG PO TABS: 1000.0000 mg | ORAL_TABLET | Freq: Two times a day (BID) | ORAL | 0 refills | Status: DC

## 2020-01-08 MED ORDER — NONFORMULARY OR COMPOUNDED ITEM: 2 refills | Status: DC

## 2020-01-08 NOTE — Assessment & Plan Note (Signed)
hgba1c to be checked, minimize simple carbs. Increase exercise as tolerated. Continue current meds  

## 2020-01-08 NOTE — Progress Notes (Signed)
Patient ID: Danielle Flores, female    DOB: 03/18/1966  Age: 54 y.o. MRN: [id number]    Subjective:  Subjective  HPI Danielle Flores presents for f/u bp, chol and dm  HYPERTENSION   Blood pressure range-not checking   Chest pain- no      Dyspnea- no Lightheadedness- no   Edema- no  Other side effects - no   Medication compliance: good Low salt diet- yes     DIABETES    Blood Sugar ranges-112-115  Polyuria- no New Visual problems- no  Hypoglycemic symptoms- no  Other side effects-no Medication compliance - good Last eye exam- due  Foot exam- today   HYPERLIPIDEMIA  Medication compliance- good RUQ pain- no  Muscle aches- no Other side effects-no     Review of Systems  Constitutional: Negative for appetite change, diaphoresis, fatigue and unexpected weight change.  Eyes: Negative for pain, redness and visual disturbance.  Respiratory: Negative for cough, chest tightness, shortness of breath and wheezing.   Cardiovascular: Negative for chest pain, palpitations and leg swelling.  Endocrine: Negative for cold intolerance, heat intolerance, polydipsia, polyphagia and polyuria.  Genitourinary: Negative for difficulty urinating, dysuria and frequency.  Neurological: Negative for dizziness, light-headedness, numbness and headaches.    History Past Medical History:  Diagnosis Date  . Hyperglycemia   . Hyperlipidemia   . Hypertension     She has a past surgical history that includes Cesarean section; LEFT HEART CATH AND CORONARY ANGIOGRAPHY (N/A, 09/16/2018); and CORONARY STENT INTERVENTION (N/A, 09/16/2018).   Her family history includes Diabetes in an other family member; Heart disease in her maternal grandmother; Sickle cell anemia in her paternal aunt.She reports that she has never smoked. She has never used smokeless tobacco. She reports current alcohol use. She reports that she does not use drugs.  Current Outpatient Medications on File Prior to Visit  Medication Sig  Dispense Refill  . amLODipine (NORVASC) 10 MG tablet Take 1/2 tablet (5 mg) for 3 days, then increase to 1 tablet (10 mg) once a day 180 tablet 3  . aspirin EC 81 MG EC tablet Take 1 tablet (81 mg total) by mouth daily with breakfast. 30 tablet 11  . blood glucose meter kit and supplies KIT Dispense based on patient and insurance preference. Use up to four times daily as directed. (FOR ICD-9 250.00, 250.01). 1 each 0  . blood glucose meter kit and supplies Relion Prime or Dispense other brand based on patient and insurance preference. Use up to four times daily as directed. (FOR ICD-9 250.00, 250.01). 1 each 11  . lisinopril (ZESTRIL) 40 MG tablet Take 1 tablet (40 mg total) by mouth daily. 90 tablet 1  . metoprolol tartrate (LOPRESSOR) 100 MG tablet Take 1 tablet (100 mg total) by mouth in the morning, at noon, and at bedtime. 270 tablet 3  . ticagrelor (BRILINTA) 90 MG TABS tablet Take 1 tablet (90 mg total) by mouth 2 (two) times daily. Please make yearly appt with Dr. Irish Lack for July for future refills. 1st attempt 180 tablet 1   No current facility-administered medications on file prior to visit.     Objective:  Objective  Physical Exam Vitals and nursing note reviewed.  Constitutional:      Appearance: She is well-developed.  HENT:     Head: Normocephalic and atraumatic.  Eyes:     Conjunctiva/sclera: Conjunctivae normal.  Neck:     Thyroid: No thyromegaly.     Vascular: No carotid bruit or JVD.  Cardiovascular:     Rate and Rhythm: Normal rate and regular rhythm.     Heart sounds: Normal heart sounds. No murmur.  Pulmonary:     Effort: Pulmonary effort is normal. No respiratory distress.     Breath sounds: Normal breath sounds. No wheezing or rales.  Chest:     Chest wall: No tenderness.  Musculoskeletal:     Cervical back: Normal range of motion and neck supple.  Neurological:     Mental Status: She is alert and oriented to person, place, and time.    Diabetic Foot  Exam - Simple   Simple Foot Form Diabetic Foot exam was performed with the following findings: Yes 01/08/2020  1:50 PM  Visual Inspection No deformities, no ulcerations, no other skin breakdown bilaterally: Yes Sensation Testing Intact to touch and monofilament testing bilaterally: Yes Pulse Check Posterior Tibialis and Dorsalis pulse intact bilaterally: Yes Comments     BP (!) 142/90 (BP Location: Right Arm, Patient Position: Sitting, Cuff Size: Normal)   Pulse 83   Temp (!) 97.5 F (36.4 C) (Temporal)   Resp 18   Ht _0  (1.676 m)   Wt 207 lb (93.9 kg)   LMP  (LMP Unknown)   SpO2 99%   BMI 33.41 kg/m  Wt Readings from Last 3 Encounters:  01/08/20 207 lb (93.9 kg)  10/21/19 204 lb 9.6 oz (92.8 kg)  02/19/19 175 lb (79.4 kg)     Lab Results  Component Value Date   WBC 7.0 10/21/2019   HGB 13.5 10/21/2019   HCT 41.6 10/21/2019   PLT 287.0 10/21/2019   GLUCOSE 122 (H) 10/21/2019   CHOL 250 (H) 10/21/2019   TRIG 146.0 10/21/2019   HDL 42.50 10/21/2019   LDLCALC 178 (H) 10/21/2019   ALT 46 (H) 10/21/2019   AST 32 10/21/2019   NA 140 10/21/2019   K 4.1 10/21/2019   CL 102 10/21/2019   CREATININE 0.86 10/21/2019   BUN 13 10/21/2019   CO2 27 10/21/2019   TSH 1.66 10/21/2019   HGBA1C 7.5 (H) 10/23/2019   MICROALBUR 30.8 (H) 10/21/2019    CT Head Wo Contrast  Result Date: 09/13/2018 CLINICAL DATA:  Slurred speech EXAM: CT HEAD WITHOUT CONTRAST TECHNIQUE: Contiguous axial images were obtained from the base of the skull through the vertex without intravenous contrast. COMPARISON:  None. FINDINGS: Brain: No evidence of acute infarction, hemorrhage, extra-axial collection, ventriculomegaly, or mass effect. Old right basal ganglia lacunar infarct. Generalized cerebral atrophy. Periventricular white matter low attenuation likely secondary to microangiopathy. Vascular: No hyperdense vessel. No significant intracranial atherosclerotic disease. Skull: Negative for fracture or  focal lesion. Sinuses/Orbits: Visualized portions of the orbits are unremarkable. Visualized portions of the paranasal sinuses and mastoid air cells are unremarkable. Other: None. IMPRESSION: No acute intracranial pathology. Electronically Signed   By: Kathreen Devoid   On: 09/13/2018 01:36   DG Chest Port 1 View  Result Date: 09/13/2018 CLINICAL DATA:  Slurred speech, hand cramping for 48 hours EXAM: PORTABLE CHEST 1 VIEW COMPARISON:  None. FINDINGS: The heart size and mediastinal contours are within normal limits. Both lungs are clear. The visualized skeletal structures are unremarkable. IMPRESSION: No active disease. Electronically Signed   By: Kathreen Devoid   On: 09/13/2018 01:34   ECHOCARDIOGRAM COMPLETE  Result Date: 09/13/2018   ECHOCARDIOGRAM REPORT   Patient Name:   AZAYLA POLO Date of Exam: 09/13/2018 Medical Rec #:  [id number]        Height:  66.0 in Accession #:    [id number]       Weight:       189.0 lb Date of Birth:  04-14-1966         BSA:          1.95 m Patient Age:    76 years         BP:           141/75 mmHg Patient Gender: F                HR:           76 bpm. Exam Location:  Inpatient  Procedure: 2D Echo and Strain Analysis Indications:    Elevated Troponin R79.89  History:        Patient has no prior history of Echocardiogram examinations.                 Family History of Cardiomegly, Chronic Kidney Disease II, Sickle                 Cell Anemia, Elevated Troponins, Hyperglycemia, Chest Pain.  Sonographer:    Madelaine Etienne RDCS (AE) Referring Phys: [id number] Doreatha Lew [facility]  Sonographer Comments: Uncooperative. IMPRESSIONS  1. The left ventricle has normal systolic function of 93-26%. The cavity size was normal. There is severe septal hypertrophy with otherwise moderate concentric LVH. Echo evidence of pseudonormalization in diastolic relaxation Elevated left ventricular end-diastolic pressure.  2. 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 There is a mid  cavitary gradient with a peak velocity 2.18 m/s and peak gradient 19 mmHg.  3. The right ventricle has normal systolic function. The cavity was normal. There is no increase in right ventricular wall thickness.  4. The mitral valve is normal in structure.  5. The tricuspid valve is normal in structure.  6. The aortic valve is normal in structure.  7. The pulmonic valve was normal in structure.  8. Grade 2 diastolic dysfunction. FINDINGS  Left Ventricle: The left ventricle has normal systolic function of 71-24%. The cavity size was normal. There is severe septal hypertrophy with otherwise moderate concentric LVH. 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 There is a mid cavitary gradient with a peak velocity 2.18 m/s and peak gradient 19 mmHg. Right Ventricle: The right ventricle has normal systolic function. The cavity was normal. There is no increase in right ventricular wall thickness. Left Atrium: left atrial size was normal in size Right Atrium: right atrial size was normal in size Interatrial Septum: No atrial level shunt detected by color flow Doppler. Pericardium: There is no evidence of pericardial effusion. Mitral Valve: The mitral valve is normal in structure. Mitral valve regurgitation is trivial by color flow Doppler. Tricuspid Valve: The tricuspid valve is normal in structure. Tricuspid valve regurgitation was not visualized by color flow Doppler. Aortic Valve: The aortic valve is normal in structure. Aortic valve regurgitation was not visualized by color flow Doppler. Pulmonic Valve: The pulmonic valve was normal in structure. Pulmonic valve regurgitation is trivial by color flow Doppler. Venous: The inferior vena cava is normal in size with greater than 50% respiratory variability.  LEFT VENTRICLE PLAX 2D (Teich)              Biplane EF (MOD) LV EF:          73.7 %        LV Biplane EF:  56.7 % LVIDd:          2.80 cm      LV A4C EF:       56.8 % LVIDs:          1.65 cm      LV A2C EF:       56.8 % LV PW:          1.25 cm LV IVS:         1.70 cm      Diastology LVOT diam:      1.70 cm      LV e' lateral:   7.94 cm/s LV SV:          22 ml        LV E/e' lateral: 12.0 LVOT Area:      2.27 cm     LV e' medial:    3.81 cm/s                              LV E/e' medial:  25.1 LV Volumes (MOD) LV area d, A2C:    31.70 cm LV area d, A4C:    32.10 cm LV area s, A2C:    18.90 cm LV area s, A4C:    19.30 cm LV major d, A2C:   8.43 cm LV major d, A4C:   8.07 cm LV major s, A2C:   7.12 cm LV major s, A4C:   6.81 cm LV vol d, MOD A2C: 95.6 ml LV vol d, MOD A4C: 103.0 ml LV vol s, MOD A2C: 41.3 ml LV vol s, MOD A4C: 44.5 ml LV SV MOD A2C:     54.3 ml LV SV MOD A4C:     103.0 ml LV SV MOD BP:      57.3 ml RIGHT VENTRICLE RV Basal diam:  3.10 cm RV Mid diam:    2.50 cm RV S prime:     10.30 cm/s TAPSE (M-mode): 2.2 cm RVSP:           17.0 mmHg LEFT ATRIUM             Index       RIGHT ATRIUM           Index LA diam:        3.70 cm 1.90 cm/m  RA Pressure: 3 mmHg LA Vol (A2C):   44.2 ml 22.64 ml/m RA Area:     11.60 cm LA Vol (A4C):   36.9 ml 18.90 ml/m RA Volume:   26.80 ml  13.73 ml/m LA Biplane Vol: 42.5 ml 21.77 ml/m  AORTIC VALVE                    PULMONIC VALVE AV Area (Vmax):    1.76 cm     PV Vmax:       1.06 m/s AV Area (Vmean):   1.73 cm     PV Peak grad:  4.5 mmHg AV Area (VTI):     1.66 cm AV Vmax:           157.00 cm/s AV Vmean:          108.500 cm/s AV VTI:            0.337 m AV Peak Grad:      9.9 mmHg AV Mean Grad:      5.0 mmHg LVOT Vmax:         122.00  cm/s LVOT Vmean:        82.500 cm/s LVOT VTI:          0.246 m LVOT/AV VTI ratio: 0.73  AORTA Ao Root diam: 3.20 cm Ao Asc diam:  3.40 cm MITRAL VALVE              TR Peak grad: 14.0 mmHg MV Area (PHT): 2.96 cm   TR Vmax:      186.77 cm/s MV PHT:        74.24 msec RVSP:         17.0 mmHg MV Decel Time: 256 msec MV E  velocity: 95.50 cm/s MV A velocity: 84.00 cm/s MV E/A ratio:  1.14  Skeet Latch MD Electronically signed by Skeet Latch MD Signature Date/Time: 09/13/2018/2:30:42 PM    Final      Assessment & Plan:  Plan  I have discontinued Danielle Flores sitaGLIPtin. I am also having her start on NONFORMULARY OR COMPOUNDED ITEM. Additionally, I am having her maintain her blood glucose meter kit and supplies, aspirin, blood glucose meter kit and supplies, amLODipine, lisinopril, metoprolol tartrate, ticagrelor, and metFORMIN.  Meds ordered this encounter  Medications  . metFORMIN (GLUCOPHAGE) 500 MG tablet    Sig: Take 2 tablets (1,000 mg total) by mouth 2 (two) times daily with a meal.    Dispense:  360 tablet    Refill:  0    Needs to schedule follow up visit.  . NONFORMULARY OR COMPOUNDED ITEM    Sig: lysulin  1 po tid    Dispense:  90 each    Refill:  2    Problem List Items Addressed This Visit      Unprioritized   DM (diabetes mellitus) type II uncontrolled, periph vascular disorder ([place])    hgba1c to be checked, minimize simple carbs. Increase exercise as tolerated. Continue current meds       Relevant Medications   metFORMIN (GLUCOPHAGE) 500 MG tablet   Essential hypertension    Poorly controlled, encouraged DASH diet, minimize caffeine and obtain adequate sleep. Report concerning symptoms and follow up as directed and as needed con't meds F/u cardiology      Relevant Orders   Comprehensive metabolic panel   Hyperlipidemia LDL goal <100   Relevant Orders   Lipid panel    Other Visit Diagnoses    Uncontrolled type 2 diabetes mellitus with hyperglycemia ([place])    -  Primary   Relevant Medications   metFORMIN (GLUCOPHAGE) 500 MG tablet   NONFORMULARY OR COMPOUNDED ITEM   Other Relevant Orders   Comprehensive metabolic panel   Hemoglobin A1c   Lipid panel      Follow-up: Return in about 6 months (around 07/09/2020), or if symptoms worsen or fail  to improve, for hypertension, hyperlipidemia, diabetes II.  Ann Held, DO

## 2020-01-08 NOTE — Patient Instructions (Signed)
Carbohydrate Counting for Diabetes Mellitus, Adult  Carbohydrate counting is a method of keeping track of how many carbohydrates you eat. Eating carbohydrates naturally increases the amount of sugar (glucose) in the blood. Counting how many carbohydrates you eat helps keep your blood glucose within normal limits, which helps you manage your diabetes (diabetes mellitus). It is important to know how many carbohydrates you can safely have in each meal. This is different for every person. A diet and nutrition specialist (registered dietitian) can help you make a meal plan and calculate how many carbohydrates you should have at each meal and snack. Carbohydrates are found in the following foods:  Grains, such as breads and cereals.  Dried beans and soy products.  Starchy vegetables, such as potatoes, peas, and corn.  Fruit and fruit juices.  Milk and yogurt.  Sweets and snack foods, such as cake, cookies, candy, chips, and soft drinks. How do I count carbohydrates? There are two ways to count carbohydrates in food. You can use either of the methods or a combination of both. Reading "Nutrition Facts" on packaged food The "Nutrition Facts" list is included on the labels of almost all packaged foods and beverages in the [place] It includes:  The serving size.  Information about nutrients in each serving, including the grams (g) of carbohydrate per serving. To use the "Nutrition Facts":  Decide how many servings you will have.  Multiply the number of servings by the number of carbohydrates per serving.  The resulting number is the total amount of carbohydrates that you will be having. Learning standard serving sizes of other foods When you eat carbohydrate foods that are not packaged or do not include "Nutrition Facts" on the label, you need to measure the servings in order to count the amount of carbohydrates:  Measure the foods that you will eat with a food scale or measuring cup, if  needed.  Decide how many standard-size servings you will eat.  Multiply the number of servings by 15. Most carbohydrate-rich foods have about 15 g of carbohydrates per serving. ? For example, if you eat 8 oz (170 g) of strawberries, you will have eaten 2 servings and 30 g of carbohydrates (2 servings x 15 g = 30 g).  For foods that have more than one food mixed, such as soups and casseroles, you must count the carbohydrates in each food that is included. The following list contains standard serving sizes of common carbohydrate-rich foods. Each of these servings has about 15 g of carbohydrates:   hamburger bun or  English muffin.   oz (15 mL) syrup.   oz (14 g) jelly.  1 slice of bread.  1 six-inch tortilla.  3 oz (85 g) cooked rice or pasta.  4 oz (113 g) cooked dried beans.  4 oz (113 g) starchy vegetable, such as peas, corn, or potatoes.  4 oz (113 g) hot cereal.  4 oz (113 g) mashed potatoes or  of a large baked potato.  4 oz (113 g) canned or frozen fruit.  4 oz (120 mL) fruit juice.  4-6 crackers.  6 chicken nuggets.  6 oz (170 g) unsweetened dry cereal.  6 oz (170 g) plain fat-free yogurt or yogurt sweetened with artificial sweeteners.  8 oz (240 mL) milk.  8 oz (170 g) fresh fruit or one small piece of fruit.  24 oz (680 g) popped popcorn. Example of carbohydrate counting Sample meal  3 oz (85 g) chicken breast.  6 oz (170 g)   brown rice.  4 oz (113 g) corn.  8 oz (240 mL) milk.  8 oz (170 g) strawberries with sugar-free whipped topping. Carbohydrate calculation 1. Identify the foods that contain carbohydrates: ? Rice. ? Corn. ? Milk. ? Strawberries. 2. Calculate how many servings you have of each food: ? 2 servings rice. ? 1 serving corn. ? 1 serving milk. ? 1 serving strawberries. 3. Multiply each number of servings by 15 g: ? 2 servings rice x 15 g = 30 g. ? 1 serving corn x 15 g = 15 g. ? 1 serving milk x 15 g = 15 g. ? 1  serving strawberries x 15 g = 15 g. 4. Add together all of the amounts to find the total grams of carbohydrates eaten: ? 30 g + 15 g + 15 g + 15 g = 75 g of carbohydrates total. Summary  Carbohydrate counting is a method of keeping track of how many carbohydrates you eat.  Eating carbohydrates naturally increases the amount of sugar (glucose) in the blood.  Counting how many carbohydrates you eat helps keep your blood glucose within normal limits, which helps you manage your diabetes.  A diet and nutrition specialist (registered dietitian) can help you make a meal plan and calculate how many carbohydrates you should have at each meal and snack. This information is not intended to replace advice given to you by your health care provider. Make sure you discuss any questions you have with your health care provider. Document Revised: 02/15/2017 Document Reviewed: 01/05/2016 Elsevier Patient Education  2020 Elsevier Inc.  

## 2020-01-08 NOTE — Assessment & Plan Note (Signed)
Poorly controlled, encouraged DASH diet, minimize caffeine and obtain adequate sleep. Report concerning symptoms and follow up as directed and as needed con't meds F/u cardiology

## 2020-01-10 ENCOUNTER — Other Ambulatory Visit: Payer: Self-pay | Admitting: Family Medicine

## 2020-01-10 DIAGNOSIS — E1165 Type 2 diabetes mellitus with hyperglycemia: Secondary | ICD-10-CM

## 2020-01-10 DIAGNOSIS — E785 Hyperlipidemia, unspecified: Secondary | ICD-10-CM

## 2020-01-13 ENCOUNTER — Encounter: Payer: Self-pay | Admitting: Family Medicine

## 2020-01-15 NOTE — Telephone Encounter (Signed)
Please see result note encounter-she states she didn't call her insurance but pharmacy prices seem to be ok for her.

## 2020-01-15 NOTE — Telephone Encounter (Signed)
Try rybelsus 3 mg #30  1 po qd--- will need to increase after 1 month

## 2020-01-16 ENCOUNTER — Other Ambulatory Visit: Payer: Self-pay

## 2020-01-16 DIAGNOSIS — I10 Essential (primary) hypertension: Secondary | ICD-10-CM

## 2020-01-16 MED ORDER — RYBELSUS 3 MG PO TABS: 3.0000 mg | ORAL_TABLET | Freq: Every day | ORAL | 0 refills | Status: DC

## 2020-01-16 MED ORDER — LISINOPRIL 40 MG PO TABS: 40.0000 mg | ORAL_TABLET | Freq: Every day | ORAL | 0 refills | Status: DC

## 2020-01-16 NOTE — Telephone Encounter (Signed)
This is why I wanted her to check with her ins company --- on what they would cover See previous note or note on lab

## 2020-01-16 NOTE — Telephone Encounter (Signed)
Please advise 

## 2020-01-29 ENCOUNTER — Other Ambulatory Visit: Payer: Self-pay

## 2020-01-29 DIAGNOSIS — I10 Essential (primary) hypertension: Secondary | ICD-10-CM

## 2020-01-29 MED ORDER — LISINOPRIL 40 MG PO TABS: 40.0000 mg | ORAL_TABLET | Freq: Every day | ORAL | 0 refills | Status: DC

## 2020-02-25 ENCOUNTER — Other Ambulatory Visit: Payer: Self-pay | Admitting: Interventional Cardiology

## 2020-02-25 DIAGNOSIS — I10 Essential (primary) hypertension: Secondary | ICD-10-CM

## 2020-03-02 ENCOUNTER — Ambulatory Visit: Payer: PRIVATE HEALTH INSURANCE | Admitting: Interventional Cardiology

## 2020-03-04 ENCOUNTER — Other Ambulatory Visit: Payer: Self-pay | Admitting: Interventional Cardiology

## 2020-03-08 ENCOUNTER — Other Ambulatory Visit: Payer: Self-pay | Admitting: *Deleted

## 2020-03-08 MED ORDER — AMLODIPINE BESYLATE 10 MG PO TABS: 10.0000 mg | ORAL_TABLET | Freq: Every day | ORAL | 0 refills | Status: DC

## 2020-03-24 ENCOUNTER — Other Ambulatory Visit: Payer: Self-pay | Admitting: Interventional Cardiology

## 2020-03-24 DIAGNOSIS — I10 Essential (primary) hypertension: Secondary | ICD-10-CM

## 2020-03-27 ENCOUNTER — Other Ambulatory Visit: Payer: Self-pay | Admitting: Interventional Cardiology

## 2020-03-27 DIAGNOSIS — I10 Essential (primary) hypertension: Secondary | ICD-10-CM

## 2020-03-30 ENCOUNTER — Ambulatory Visit: Payer: PRIVATE HEALTH INSURANCE | Admitting: Interventional Cardiology

## 2020-04-05 ENCOUNTER — Encounter: Payer: Self-pay | Admitting: Family Medicine

## 2020-04-05 ENCOUNTER — Telehealth: Payer: Self-pay | Admitting: Interventional Cardiology

## 2020-04-05 DIAGNOSIS — I10 Essential (primary) hypertension: Secondary | ICD-10-CM

## 2020-04-05 MED ORDER — LISINOPRIL 40 MG PO TABS: 40.0000 mg | ORAL_TABLET | Freq: Every day | ORAL | 0 refills | Status: DC

## 2020-04-05 MED ORDER — AMLODIPINE BESYLATE 10 MG PO TABS: ORAL_TABLET | ORAL | 0 refills | Status: DC

## 2020-04-05 NOTE — Telephone Encounter (Signed)
Called and scheduled patient to be seen for her overdue follow up on Friday.

## 2020-04-05 NOTE — Telephone Encounter (Signed)
*  STAT* If patient is at the pharmacy, call can be transferred to refill team.   1. Which medications need to be refilled? (please list name of each medication and dose if known)  amLODipine (NORVASC) 10 MG tablet  lisinopril (ZESTRIL) 40 MG tablet     2. Which pharmacy/location (including street and city if local pharmacy) is medication to be sent to? [id number] - [place], [place] - [address] AT [facility] [address]  3. Do they need a 30 day or 90 day supply? 90 day supply

## 2020-04-05 NOTE — Telephone Encounter (Signed)
Spoke with the patient earlier today. She is scheduled to see Dr. Eldridge Dace on Friday and her refills have been sent in.

## 2020-04-05 NOTE — Telephone Encounter (Signed)
Patient is called and wanted to speak with Dr. Eldridge Dace or nurse regarding an appt. Please call to discuss

## 2020-04-05 NOTE — Telephone Encounter (Signed)
Pt's medication was sent to pt's pharmacy as requested. Confirmation received.  °

## 2020-04-06 ENCOUNTER — Encounter: Payer: Self-pay | Admitting: Family Medicine

## 2020-04-06 ENCOUNTER — Other Ambulatory Visit: Payer: Self-pay | Admitting: Interventional Cardiology

## 2020-04-06 DIAGNOSIS — I10 Essential (primary) hypertension: Secondary | ICD-10-CM

## 2020-04-06 DIAGNOSIS — E1165 Type 2 diabetes mellitus with hyperglycemia: Secondary | ICD-10-CM

## 2020-04-07 MED ORDER — METFORMIN HCL 500 MG PO TABS: 1000.0000 mg | ORAL_TABLET | Freq: Two times a day (BID) | ORAL | 1 refills | Status: DC

## 2020-04-08 NOTE — Progress Notes (Signed)
Cardiology Office Note   Date:  04/09/2020   ID:  Danielle Flores, DOB Apr 29, 1966, MRN [id number]  PCP:  Carollee Herter, Alferd Apa, DO    No chief complaint on file.  CAD  Wt Readings from Last 3 Encounters:  04/09/20 204 lb 9.6 oz (92.8 kg)  01/08/20 207 lb (93.9 kg)  10/21/19 204 lb 9.6 oz (92.8 kg)       History of Present Illness: Danielle Flores is a 54 y.o. female  with a hx of HTN, HLD, CAD and uncontrolled DM.  "Cath in 09/2018 for NSTEMI showed:  Prox RCA to Mid RCA lesion is 25% stenosed.  Prox Cx to Mid Cx lesion is 25% stenosed.  Ost LAD lesion is 25% stenosed.  RPDA lesion is 95% stenosed.  A drug-eluting stent was successfully placed using a STENT SYNERGY DES 2.5X16.  Post intervention, there is a 0% residual stenosis.  The left ventricular ejection fraction is greater than 65% by visual estimate.  There is hyperdynamic left ventricular systolic function.  LV end diastolic pressure is normal. LVEDP 7 mm Hg.  There is no aortic valve stenosis.  Continue dual antiplatelet therapy."  In 2020: "She was laid off. 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   BP at home has been high. It has been in the [id number] systolic."  Lisinopril was increased.   RA:[id number] healthy diet and exercise. Has had DKA in the past with dehydration. Avoid diuretics at this time.   Since the last visit, she has done well.  No sx like her prior angina.  She felt cramps in both hands at the time of her MI and felt unwell generally.  She was thought to be dehydrated.    Denies : Chest pain. Dizziness. Leg edema. Nitroglycerin use. Orthopnea. Palpitations. Paroxysmal nocturnal dyspnea. Shortness of breath. Syncope.   She walks regularly.  No bleeding problems, but does bruise easliy.     Past Medical History:  Diagnosis Date  . Hyperglycemia   . Hyperlipidemia   . Hypertension     Past Surgical History:  Procedure Laterality Date  . CESAREAN  SECTION    . CORONARY STENT INTERVENTION N/A 09/16/2018   Procedure: CORONARY STENT INTERVENTION;  Surgeon: Jettie Booze, MD;  Location: [place] [facility];  Service: Cardiovascular;  Laterality: N/A;  . LEFT HEART CATH AND CORONARY ANGIOGRAPHY N/A 09/16/2018   Procedure: LEFT HEART CATH AND CORONARY ANGIOGRAPHY;  Surgeon: Jettie Booze, MD;  Location: [place] [facility];  Service: Cardiovascular;  Laterality: N/A;     Current Outpatient Medications  Medication Sig Dispense Refill  . amLODipine (NORVASC) 10 MG tablet TAKE 1 TABLET BY MOUTH DAILY 30 tablet 2  . aspirin EC 81 MG EC tablet Take 1 tablet (81 mg total) by mouth daily with breakfast. 30 tablet 11  . blood glucose meter kit and supplies KIT Dispense based on patient and insurance preference. Use up to four times daily as directed. (FOR ICD-9 250.00, 250.01). 1 each 0  . blood glucose meter kit and supplies Relion Prime or Dispense other brand based on patient and insurance preference. Use up to four times daily as directed. (FOR ICD-9 250.00, 250.01). 1 each 11  . lisinopril (ZESTRIL) 40 MG tablet TAKE 1 TABLET BY MOUTH DAILY 30 tablet 2  . metFORMIN (GLUCOPHAGE) 500 MG tablet Take 2 tablets (1,000 mg total) by mouth 2 (two) times daily with a meal. 360 tablet 1  . metoprolol tartrate (  LOPRESSOR) 100 MG tablet Take 1 tablet (100 mg total) by mouth in the morning, at noon, and at bedtime. 270 tablet 3  . NONFORMULARY OR COMPOUNDED ITEM lysulin  1 po tid 90 each 2  . Semaglutide (RYBELSUS) 3 MG TABS Take 3 mg by mouth daily. 30 tablet 0  . ticagrelor (BRILINTA) 90 MG TABS tablet Take 1 tablet (90 mg total) by mouth 2 (two) times daily. Please make yearly appt with Dr. Irish Lack for July for future refills. 1st attempt 180 tablet 1   No current facility-administered medications for this visit.    Allergies:   Patient has no known allergies.    Social History:  The patient  reports that she has never smoked. She has never  used smokeless tobacco. She reports current alcohol use. She reports that she does not use drugs.   Family History:  The patient's *family history includes Diabetes in an other family member; Heart disease in her maternal grandmother; Sickle cell anemia in her paternal aunt.    ROS:  Please see the history of present illness.   Otherwise, review of systems are positive for easy bruising.   All other systems are reviewed and negative.    PHYSICAL EXAM: VS:  BP (!) 146/82   Pulse 86   Ht _0  (1.676 m)   Wt 204 lb 9.6 oz (92.8 kg)   LMP  (LMP Unknown)   SpO2 95%   BMI 33.02 kg/m  , BMI Body mass index is 33.02 kg/m. GEN: Well nourished, well developed, in no acute distress  HEENT: normal  Neck: no JVD, carotid bruits, or masses Cardiac: RRR; no murmurs, rubs, or gallops,no edema  Respiratory:  clear to auscultation bilaterally, normal work of breathing GI: soft, nontender, nondistended, + BS MS: no deformity or atrophy  Skin: warm and dry, no rash Neuro:  Strength and sensation are intact Psych: euthymic mood, full affect   EKG:   The ekg ordered today demonstrates NSR, inferior ST changes   Recent Labs: 10/21/2019: Hemoglobin 13.5; Platelets 287.0; TSH 1.66 01/08/2020: ALT 45; BUN 15; Creatinine, Ser 1.03; Potassium 4.7; Sodium 140   Lipid Panel    Component Value Date/Time   CHOL 203 (H) 01/08/2020 1342   TRIG 206.0 (H) 01/08/2020 1342   HDL 41.40 01/08/2020 1342   CHOLHDL 5 01/08/2020 1342   VLDL 41.2 (H) 01/08/2020 1342   LDLCALC 178 (H) 10/21/2019 1441   LDLDIRECT 131.0 01/08/2020 1342     Other studies Reviewed: Additional studies/ records that were reviewed today with results demonstrating: LDL 131 in 6/21.   ASSESSMENT AND PLAN:  1.   CAD: No angina.  COntinue aggressive secondary prevention.  Stop Brilinta.  Start clopidogrel 75 mg daily.  In the future, could stop aspirin and continue clopidogrel monotherapy. 2.   Hyperlipidemia: Did not tolerate  atorvastatin.  SHe wanted to try red yeast rice, but has not started.  Start rosuvastatin.  Check lipids and liver in 2-3 months.  3.   HTN: Low salt diet.  Home readings are in the 120s /80s. 4.   DM2: A1C 7.6.  Whole food plant based diet recommended. She works with [employer].   Current medicines are reviewed at length with the patient today.  The patient concerns regarding her medicines were addressed.  The following changes have been made: As above  Labs/ tests ordered today include: Lipids in 2 to 3 months No orders of the defined types were placed in this encounter.  Recommend 150 minutes/week of aerobic exercise Low fat, low carb, high fiber diet recommended  Disposition:   FU in 1 year   Signed, Larae Grooms, MD  04/09/2020 2:58 PM    [facility] [address], [place], [place]  [address] Phone: [phone]; Fax: [phone]

## 2020-04-09 ENCOUNTER — Other Ambulatory Visit: Payer: Self-pay

## 2020-04-09 ENCOUNTER — Ambulatory Visit (INDEPENDENT_AMBULATORY_CARE_PROVIDER_SITE_OTHER): Payer: PRIVATE HEALTH INSURANCE | Admitting: Interventional Cardiology

## 2020-04-09 ENCOUNTER — Encounter: Payer: Self-pay | Admitting: Interventional Cardiology

## 2020-04-09 VITALS — BP 146/82 | HR 86 | Ht 66.0 in | Wt 204.6 lb

## 2020-04-09 DIAGNOSIS — I2510 Atherosclerotic heart disease of native coronary artery without angina pectoris: Secondary | ICD-10-CM

## 2020-04-09 DIAGNOSIS — I119 Hypertensive heart disease without heart failure: Secondary | ICD-10-CM

## 2020-04-09 DIAGNOSIS — E1110 Type 2 diabetes mellitus with ketoacidosis without coma: Secondary | ICD-10-CM

## 2020-04-09 DIAGNOSIS — I252 Old myocardial infarction: Secondary | ICD-10-CM

## 2020-04-09 MED ORDER — CLOPIDOGREL BISULFATE 75 MG PO TABS
75.0000 mg | ORAL_TABLET | Freq: Every day | ORAL | 3 refills | Status: DC
Start: 2020-04-09 — End: 2020-05-07

## 2020-04-09 MED ORDER — ROSUVASTATIN CALCIUM 20 MG PO TABS: 20.0000 mg | ORAL_TABLET | Freq: Every day | ORAL | 3 refills | Status: DC

## 2020-04-09 NOTE — Patient Instructions (Signed)
Medication Instructions:  Your physician has recommended you make the following change in your medication:   1. STOP: Brilinta  2. START: clopidogrel (plavix) 75 mg: Take 1 tablet by mouth once a day  3. START: rosuvastatin (crestor) 20 mg: Take 1 tablet by mouth once a day  *If you need a refill on your cardiac medications before your next appointment, please call your pharmacy*   Lab Work: Your physician recommends that you return for a FASTING lipid profile and liver function panel in 2-3 months  If you have labs (blood work) drawn today and your tests are completely normal, you will receive your results only by: Marland Kitchen MyChart Message (if you have MyChart) OR . A paper copy in the mail If you have any lab test that is abnormal or we need to change your treatment, we will call you to review the results.   Testing/Procedures: None   Follow-Up: At [facility], you and your health needs are our priority.  As part of our continuing mission to provide you with exceptional heart care, we have created designated Provider Care Teams.  These Care Teams include your primary Cardiologist (physician) and Advanced Practice Providers (APPs -  Physician Assistants and Nurse Practitioners) who all work together to provide you with the care you need, when you need it.  We recommend signing up for the patient portal called "MyChart".  Sign up information is provided on this After Visit Summary.  MyChart is used to connect with patients for Virtual Visits (Telemedicine).  Patients are able to view lab/test results, encounter notes, upcoming appointments, etc.  Non-urgent messages can be sent to your provider as well.   To learn more about what you can do with MyChart, go to [email or web address].    Your next appointment:   12 month(s)  The format for your next appointment:   In Person  Provider:   You may see Lance Muss, MD or one of the following Advanced Practice Providers on your  designated Care Team:    Ronie Spies, PA-C  Jacolyn Reedy, PA-C    Other Instructions None

## 2020-05-06 LAB — HM MAMMOGRAPHY

## 2020-05-07 ENCOUNTER — Other Ambulatory Visit: Payer: Self-pay | Admitting: Interventional Cardiology

## 2020-05-07 ENCOUNTER — Telehealth: Payer: Self-pay | Admitting: Interventional Cardiology

## 2020-05-07 MED ORDER — TICAGRELOR 60 MG PO TABS
60.0000 mg | ORAL_TABLET | Freq: Two times a day (BID) | ORAL | 3 refills | Status: DC
Start: 2020-05-07 — End: 2020-05-07

## 2020-05-07 MED ORDER — TICAGRELOR 60 MG PO TABS
60.0000 mg | ORAL_TABLET | Freq: Two times a day (BID) | ORAL | 3 refills | Status: DC
Start: 2020-05-07 — End: 2020-12-16

## 2020-05-07 MED ORDER — TICAGRELOR 60 MG PO TABS: 60.0000 mg | ORAL_TABLET | Freq: Two times a day (BID) | ORAL | 3 refills | Status: DC

## 2020-05-07 NOTE — Telephone Encounter (Signed)
*  STAT* If patient is at the pharmacy, call can be transferred to refill team.   1. Which medications need to be refilled? (please list name of each medication and dose if known) Brilinta 90 mg (Patient states prefers to stop taking Plavix and to switch to Brilinta)  2. Which pharmacy/location (including street and city if local pharmacy) is medication to be sent to? [facility] (phone number: [phone])  3. Do they need a 30 day or 90 day supply? 90 day supply

## 2020-05-07 NOTE — Telephone Encounter (Signed)
Patient called stating her prescribed for ticagrelor (BRILINTA) 60 MG TABS tablet, needs to go to [facility].  It was sent to [facility] in error. She needs to today to [facility], so she had have medication by tomorrow

## 2020-05-07 NOTE — Telephone Encounter (Signed)
Called and spoke to patient. She states that she has been having intermittent palpitations and has felt out of sort since starting the plavix and she prefers to go back on Brilinta. Made patient aware that I will send message to Dr. Eldridge Dace for review and recommendation.

## 2020-05-07 NOTE — Telephone Encounter (Signed)
Can stop plavix and start Brilinta 60 mg BID. JV

## 2020-05-07 NOTE — Telephone Encounter (Signed)
Called and spoke to patient. Made her aware that I have faxed her Rx for brilinta 60 mg BID to [facility] with confirmation that the fax was successful. She states that she will continue taking the plavix until she receives her supply of brilinta.

## 2020-05-07 NOTE — Telephone Encounter (Signed)
Spoke with the patient and advised her that per Dr. Eldridge Dace she can stop Plavix and start on Brilinta 60 mg twice daily.

## 2020-05-08 ENCOUNTER — Other Ambulatory Visit: Payer: Self-pay | Admitting: Interventional Cardiology

## 2020-05-08 ENCOUNTER — Other Ambulatory Visit: Payer: Self-pay | Admitting: Family Medicine

## 2020-05-08 DIAGNOSIS — I10 Essential (primary) hypertension: Secondary | ICD-10-CM

## 2020-05-14 NOTE — Telephone Encounter (Addendum)
**Note De-Identified Fabrizzio Marcella Obfuscation** Letter received from [facility] asst Sophonie Goforth fax stating that the have shipped the pts Brilinta to her home.  [employer] tracking #: [id number]. Patient ID: [id number]

## 2020-05-18 ENCOUNTER — Telehealth: Payer: Self-pay | Admitting: Interventional Cardiology

## 2020-05-18 DIAGNOSIS — I10 Essential (primary) hypertension: Secondary | ICD-10-CM

## 2020-05-18 MED ORDER — LISINOPRIL 40 MG PO TABS: 40.0000 mg | ORAL_TABLET | Freq: Every day | ORAL | 3 refills | Status: DC

## 2020-05-18 NOTE — Telephone Encounter (Signed)
*  STAT* If patient is at the pharmacy, call can be transferred to refill team.   1. Which medications need to be refilled? (please list name of each medication and dose if known) lisinopril (ZESTRIL) 40 MG tablet  2. Which pharmacy/location (including street and city if local pharmacy) is medication to be sent to? [id number] - [place], [place] - [address] AT [facility] [address]  3. Do they need a 30 day or 90 day supply? 90 day  Patient is out of medication

## 2020-05-18 NOTE — Telephone Encounter (Signed)
Pt's medication was sent to pt's pharmacy as requested. Confirmation received.  °

## 2020-06-10 ENCOUNTER — Other Ambulatory Visit: Payer: PRIVATE HEALTH INSURANCE

## 2020-06-10 ENCOUNTER — Encounter: Payer: Self-pay | Admitting: Family Medicine

## 2020-06-10 ENCOUNTER — Other Ambulatory Visit: Payer: Self-pay

## 2020-06-10 DIAGNOSIS — E1110 Type 2 diabetes mellitus with ketoacidosis without coma: Secondary | ICD-10-CM

## 2020-06-10 DIAGNOSIS — I252 Old myocardial infarction: Secondary | ICD-10-CM

## 2020-06-10 DIAGNOSIS — I119 Hypertensive heart disease without heart failure: Secondary | ICD-10-CM

## 2020-06-10 DIAGNOSIS — I2510 Atherosclerotic heart disease of native coronary artery without angina pectoris: Secondary | ICD-10-CM

## 2020-06-10 LAB — HEPATIC FUNCTION PANEL
ALT: 106 IU/L — ABNORMAL HIGH (ref 0–32)
AST: 43 IU/L — ABNORMAL HIGH (ref 0–40)
Albumin: 4.7 g/dL (ref 3.8–4.9)
Alkaline Phosphatase: 132 IU/L — ABNORMAL HIGH (ref 44–121)
Bilirubin Total: 0.3 mg/dL (ref 0.0–1.2)
Bilirubin, Direct: 0.12 mg/dL (ref 0.00–0.40)
Total Protein: 7.5 g/dL (ref 6.0–8.5)

## 2020-06-10 LAB — LIPID PANEL
Chol/HDL Ratio: 3.3 ratio (ref 0.0–4.4)
Cholesterol, Total: 127 mg/dL (ref 100–199)
HDL: 38 mg/dL — ABNORMAL LOW (ref >39)
LDL Chol Calc (NIH): 68 mg/dL (ref 0–99)
Triglycerides: 118 mg/dL (ref 0–149)
VLDL Cholesterol Cal: 21 mg/dL (ref 5–40)

## 2020-06-10 NOTE — Telephone Encounter (Signed)
Please advise 

## 2020-06-10 NOTE — Telephone Encounter (Signed)
She is supposed to be on rybelsus and metformin ----insulin is not on her list and she is overdue for labs She needs ov

## 2020-06-11 ENCOUNTER — Other Ambulatory Visit: Payer: Self-pay

## 2020-06-11 DIAGNOSIS — E1165 Type 2 diabetes mellitus with hyperglycemia: Secondary | ICD-10-CM

## 2020-06-11 NOTE — Telephone Encounter (Signed)
Left message for patient to call back. See result note.

## 2020-06-11 NOTE — Telephone Encounter (Signed)
View the second message below at 9:31 am

## 2020-06-11 NOTE — Telephone Encounter (Signed)
Left message for patient to call back. See result note. °

## 2020-06-11 NOTE — Telephone Encounter (Signed)
Ok to go back on novulin 70/30 at the same dose she was on Refer to endo

## 2020-06-14 ENCOUNTER — Other Ambulatory Visit: Payer: Self-pay

## 2020-06-14 ENCOUNTER — Encounter: Payer: Self-pay | Admitting: Family Medicine

## 2020-06-14 ENCOUNTER — Telehealth: Payer: Self-pay | Admitting: Interventional Cardiology

## 2020-06-14 DIAGNOSIS — I2510 Atherosclerotic heart disease of native coronary artery without angina pectoris: Secondary | ICD-10-CM

## 2020-06-14 DIAGNOSIS — E1165 Type 2 diabetes mellitus with hyperglycemia: Secondary | ICD-10-CM

## 2020-06-14 DIAGNOSIS — R748 Abnormal levels of other serum enzymes: Secondary | ICD-10-CM

## 2020-06-14 MED ORDER — ROSUVASTATIN CALCIUM 20 MG PO TABS: 10.0000 mg | ORAL_TABLET | Freq: Every day | ORAL | 3 refills | Status: DC

## 2020-06-14 NOTE — Telephone Encounter (Signed)
-----   Message from Corky Crafts, MD sent at 06/11/2020  5:34 PM EDT ----- OK to decrease to 10 mg daily. Recheck labs in 2-3 months, liver and lipids.  JV ----- Message ----- From: Olene Floss, RPH-CPP Sent: 06/11/2020  12:31 PM EDT To: Corky Crafts, MD, Lattie Haw, RN  I wouldn't jump the statin ship yet. I would try decreasing the rosuvastatin to 10mg . Technically they are not 3X ULN but I can see the pt is freaking out. So decreasing dose is probably a good idea. ----- Message ----- From: , MD Sent: 06/11/2020  10:58 AM EDT To: 13/12/2019, RPH-CPP, #  Lipids controlled, but LFTs increased.  ? If we should consider PCSK-9 inhibitor.  Also, would have her minimize alcohol and tylenol.

## 2020-06-14 NOTE — Telephone Encounter (Signed)
Follow Up:    Pt is returning your call from Friday, concerning her results.

## 2020-06-14 NOTE — Telephone Encounter (Signed)
The patient has been notified of the result and recommendations to decrease crestor to 10 mg QD, avoid alcohol and tylenol, and repeat labs in 2-3 months. Patient verbalized understanding. Labs scheduled for 1/10. All questions (if any) were answered. Lattie Haw, RN 06/14/2020 2:36 PM

## 2020-06-14 NOTE — Telephone Encounter (Signed)
Ok to send in--- is this in place of something?   She is overdue for ov and labs

## 2020-06-15 MED ORDER — NOVOLIN 70/30 FLEXPEN (70-30) 100 UNIT/ML ~~LOC~~ SUPN: 22.0000 [IU] | PEN_INJECTOR | Freq: Two times a day (BID) | SUBCUTANEOUS | 11 refills | Status: DC

## 2020-06-15 NOTE — Telephone Encounter (Signed)
Spoke with patient, verified her dose. She states she injects 22 units bid. She is requesting flex pen be sent in to the pharmacy for her. I have send in prescription for patient.

## 2020-06-15 NOTE — Addendum Note (Signed)
Addended by: Steve Rattler A on: 06/15/2020 11:37 AM   Modules accepted: Orders

## 2020-07-13 ENCOUNTER — Encounter: Payer: Self-pay | Admitting: Internal Medicine

## 2020-07-15 ENCOUNTER — Telehealth: Payer: Self-pay

## 2020-07-15 NOTE — Telephone Encounter (Signed)
Letter received from [facility] via fax stating that the have shipped the pts Brilinta to her home.  [employer] tracking #: [id number]. Patient ID: [id number]

## 2020-08-16 ENCOUNTER — Other Ambulatory Visit: Payer: PRIVATE HEALTH INSURANCE | Admitting: *Deleted

## 2020-08-16 ENCOUNTER — Other Ambulatory Visit: Payer: Self-pay

## 2020-08-16 DIAGNOSIS — I2510 Atherosclerotic heart disease of native coronary artery without angina pectoris: Secondary | ICD-10-CM

## 2020-08-16 DIAGNOSIS — R748 Abnormal levels of other serum enzymes: Secondary | ICD-10-CM

## 2020-08-16 LAB — LIPID PANEL
Chol/HDL Ratio: 5.5 ratio — ABNORMAL HIGH (ref 0.0–4.4)
Cholesterol, Total: 244 mg/dL — ABNORMAL HIGH (ref 100–199)
HDL: 44 mg/dL (ref >39)
LDL Chol Calc (NIH): 176 mg/dL — ABNORMAL HIGH (ref 0–99)
Triglycerides: 132 mg/dL (ref 0–149)
VLDL Cholesterol Cal: 24 mg/dL (ref 5–40)

## 2020-08-16 LAB — HEPATIC FUNCTION PANEL
ALT: 40 IU/L — ABNORMAL HIGH (ref 0–32)
AST: 30 IU/L (ref 0–40)
Albumin: 4.5 g/dL (ref 3.8–4.9)
Alkaline Phosphatase: 109 IU/L (ref 44–121)
Bilirubin Total: 0.3 mg/dL (ref 0.0–1.2)
Bilirubin, Direct: <0.1 mg/dL (ref 0.00–0.40)
Total Protein: 7.7 g/dL (ref 6.0–8.5)

## 2020-08-18 ENCOUNTER — Telehealth: Payer: Self-pay

## 2020-08-18 DIAGNOSIS — I2510 Atherosclerotic heart disease of native coronary artery without angina pectoris: Secondary | ICD-10-CM

## 2020-08-18 DIAGNOSIS — R748 Abnormal levels of other serum enzymes: Secondary | ICD-10-CM

## 2020-08-18 DIAGNOSIS — E785 Hyperlipidemia, unspecified: Secondary | ICD-10-CM

## 2020-08-18 MED ORDER — EZETIMIBE 10 MG PO TABS: 10.0000 mg | ORAL_TABLET | Freq: Every day | ORAL | 3 refills | Status: DC

## 2020-08-18 NOTE — Telephone Encounter (Signed)
-----   Message from Corky Crafts, MD sent at 08/18/2020  3:14 PM EST ----- LDL much higher.  Did she start the rosuvastatin?  LFTs improved.

## 2020-08-18 NOTE — Telephone Encounter (Signed)
Patient called notified of results pt stated "she's taking crestor 10mg  as ordered. This was recently decreased due to increased LFT's.   D consulted regarding labs recommended adding Zetia 10 mg daily and recheck lipids and LFT in 6 weeks.  Patient agreeable to recommendation labs ordered and scheduled and medication sent to pharmacy.

## 2020-08-19 NOTE — Telephone Encounter (Signed)
I'll keep my eye out for her lipid panel results next month and follow up with her accordingly.

## 2020-08-19 NOTE — Telephone Encounter (Signed)
I think she should f/u in the lipid clinic after the next lipid test.   JV

## 2020-08-30 ENCOUNTER — Ambulatory Visit: Payer: 59 | Admitting: Family Medicine

## 2020-09-20 ENCOUNTER — Other Ambulatory Visit: Payer: Self-pay

## 2020-09-20 DIAGNOSIS — I10 Essential (primary) hypertension: Secondary | ICD-10-CM

## 2020-09-20 MED ORDER — LISINOPRIL 40 MG PO TABS: 40.0000 mg | ORAL_TABLET | Freq: Every day | ORAL | 2 refills | Status: DC

## 2020-09-20 MED ORDER — AMLODIPINE BESYLATE 10 MG PO TABS: 10.0000 mg | ORAL_TABLET | Freq: Every day | ORAL | 2 refills | Status: DC

## 2020-09-20 NOTE — Telephone Encounter (Signed)
Pt's medications were sent to pt's pharmacy as requested. Confirmation received.  

## 2020-10-01 ENCOUNTER — Other Ambulatory Visit: Payer: Self-pay | Admitting: Family Medicine

## 2020-10-01 DIAGNOSIS — E1165 Type 2 diabetes mellitus with hyperglycemia: Secondary | ICD-10-CM

## 2020-10-04 ENCOUNTER — Other Ambulatory Visit: Payer: 59 | Admitting: *Deleted

## 2020-10-04 ENCOUNTER — Other Ambulatory Visit: Payer: Self-pay

## 2020-10-04 DIAGNOSIS — I2510 Atherosclerotic heart disease of native coronary artery without angina pectoris: Secondary | ICD-10-CM

## 2020-10-04 DIAGNOSIS — E785 Hyperlipidemia, unspecified: Secondary | ICD-10-CM

## 2020-10-05 LAB — LIPID PANEL
Chol/HDL Ratio: 3 ratio (ref 0.0–4.4)
Cholesterol, Total: 124 mg/dL (ref 100–199)
HDL: 42 mg/dL (ref >39)
LDL Chol Calc (NIH): 62 mg/dL (ref 0–99)
Triglycerides: 112 mg/dL (ref 0–149)
VLDL Cholesterol Cal: 20 mg/dL (ref 5–40)

## 2020-10-05 LAB — HEPATIC FUNCTION PANEL
ALT: 52 IU/L — ABNORMAL HIGH (ref 0–32)
AST: 33 IU/L (ref 0–40)
Albumin: 4.5 g/dL (ref 3.8–4.9)
Alkaline Phosphatase: 107 IU/L (ref 44–121)
Bilirubin Total: 0.2 mg/dL (ref 0.0–1.2)
Bilirubin, Direct: 0.1 mg/dL (ref 0.00–0.40)
Total Protein: 7.6 g/dL (ref 6.0–8.5)

## 2020-10-06 DIAGNOSIS — E785 Hyperlipidemia, unspecified: Secondary | ICD-10-CM

## 2020-10-06 DIAGNOSIS — R748 Abnormal levels of other serum enzymes: Secondary | ICD-10-CM

## 2020-10-12 ENCOUNTER — Encounter: Payer: Self-pay | Admitting: Family Medicine

## 2020-10-12 DIAGNOSIS — E1165 Type 2 diabetes mellitus with hyperglycemia: Secondary | ICD-10-CM

## 2020-10-12 MED ORDER — METFORMIN HCL 500 MG PO TABS: 1000.0000 mg | ORAL_TABLET | Freq: Two times a day (BID) | ORAL | 0 refills | Status: DC

## 2020-11-10 ENCOUNTER — Other Ambulatory Visit: Payer: Self-pay | Admitting: Family Medicine

## 2020-11-10 DIAGNOSIS — E1165 Type 2 diabetes mellitus with hyperglycemia: Secondary | ICD-10-CM

## 2020-12-15 ENCOUNTER — Other Ambulatory Visit: Payer: 59

## 2020-12-16 ENCOUNTER — Other Ambulatory Visit: Payer: Self-pay

## 2020-12-16 ENCOUNTER — Other Ambulatory Visit: Payer: 59

## 2020-12-16 DIAGNOSIS — E785 Hyperlipidemia, unspecified: Secondary | ICD-10-CM

## 2020-12-16 DIAGNOSIS — R748 Abnormal levels of other serum enzymes: Secondary | ICD-10-CM

## 2020-12-16 LAB — HEPATIC FUNCTION PANEL
ALT: 42 IU/L — ABNORMAL HIGH (ref 0–32)
AST: 37 IU/L (ref 0–40)
Albumin: 4.6 g/dL (ref 3.8–4.9)
Alkaline Phosphatase: 113 IU/L (ref 44–121)
Bilirubin Total: 0.2 mg/dL (ref 0.0–1.2)
Bilirubin, Direct: 0.12 mg/dL (ref 0.00–0.40)
Total Protein: 7.4 g/dL (ref 6.0–8.5)

## 2020-12-16 MED ORDER — TICAGRELOR 60 MG PO TABS
60.0000 mg | ORAL_TABLET | Freq: Two times a day (BID) | ORAL | 3 refills | Status: DC
Start: 2020-12-16 — End: 2020-12-20

## 2020-12-16 NOTE — Telephone Encounter (Signed)
I spoke with [facility]  and patient has been approved from 12/16/20-12/15/21 for Brilinta assistance. Prescription should be faxed to [phone]. Include ID number  [id number]

## 2020-12-17 NOTE — Telephone Encounter (Signed)
I agree with Wynona Canes.  42 is just barely out of normal range.  Continue to avoid alcohol and tylenol.   JV

## 2020-12-20 ENCOUNTER — Other Ambulatory Visit: Payer: Self-pay

## 2020-12-20 ENCOUNTER — Telehealth: Payer: Self-pay

## 2020-12-20 MED ORDER — TICAGRELOR 60 MG PO TABS: 60.0000 mg | ORAL_TABLET | Freq: Two times a day (BID) | ORAL | 3 refills | Status: DC

## 2020-12-20 NOTE — Telephone Encounter (Signed)
**Note De-Identified Stanislaus Kaltenbach Obfuscation** Letter received from [employer] stating they approved the pt for Brilinta assistance through May 11,2023.  I have e-scribed the pts Brilinta RX to [facility] (Pharmacy for [place]) just in case it is needed even though there is a valid RX included with the pts application that was approved by [place] and [place]. Pt ID: [id number]

## 2021-01-11 ENCOUNTER — Encounter: Payer: Self-pay | Admitting: Family Medicine

## 2021-01-14 ENCOUNTER — Telehealth (INDEPENDENT_AMBULATORY_CARE_PROVIDER_SITE_OTHER): Payer: Self-pay | Admitting: Family Medicine

## 2021-01-14 ENCOUNTER — Other Ambulatory Visit: Payer: Self-pay

## 2021-01-14 VITALS — BP 144/88 | HR 80 | Ht 65.0 in

## 2021-01-14 DIAGNOSIS — M2550 Pain in unspecified joint: Secondary | ICD-10-CM

## 2021-01-14 DIAGNOSIS — I10 Essential (primary) hypertension: Secondary | ICD-10-CM

## 2021-01-14 DIAGNOSIS — E1165 Type 2 diabetes mellitus with hyperglycemia: Secondary | ICD-10-CM

## 2021-01-14 DIAGNOSIS — E785 Hyperlipidemia, unspecified: Secondary | ICD-10-CM

## 2021-01-14 DIAGNOSIS — E1169 Type 2 diabetes mellitus with other specified complication: Secondary | ICD-10-CM

## 2021-01-14 NOTE — Progress Notes (Signed)
h   MyChart Video Visit    Virtual Visit via Video Note   This visit type was conducted due to national recommendations for restrictions regarding the COVID-19 Pandemic (e.g. social distancing) in an effort to limit this patient's exposure and mitigate transmission in our community. This patient is at least at moderate risk for complications without adequate follow up. This format is felt to be most appropriate for this patient at this time. Physical exam was limited by quality of the video and audio technology used for the visit. Danielle Flores was able to get the patient set up on a video visit.  Patient location: Home Patient and provider in visit Provider location: Office  I discussed the limitations of evaluation and management by telemedicine and the availability of in person appointments. The patient expressed understanding and agreed to proceed.  Visit Date: 01/14/2021  Today's healthcare provider: Ann Held, DO     Subjective:    Patient ID: Danielle Flores, female    DOB: 1966/01/05, 55 y.o.   MRN: [id number]  Chief Complaint  Patient presents with   Joint Pain    HPI Patient is in today for wrist pain ulnar side both sides and she has knot on L thumb --- x 5 days   Past Medical History:  Diagnosis Date   Hyperglycemia    Hyperlipidemia    Hypertension     Past Surgical History:  Procedure Laterality Date   CESAREAN SECTION     CORONARY STENT INTERVENTION N/A 09/16/2018   Procedure: CORONARY STENT INTERVENTION;  Surgeon: Jettie Booze, MD;  Location: [facility];  Service: Cardiovascular;  Laterality: N/A;   LEFT HEART CATH AND CORONARY ANGIOGRAPHY N/A 09/16/2018   Procedure: LEFT HEART CATH AND CORONARY ANGIOGRAPHY;  Surgeon: Jettie Booze, MD;  Location: [facility];  Service: Cardiovascular;  Laterality: N/A;    Family History  Problem Relation Age of Onset   Diabetes Other        1st degree relative   Heart  disease Maternal Grandmother        heart enlargement   Sickle cell anemia Paternal Aunt     Social History   Socioeconomic History   Marital status: Married    Spouse name: Not on file   Number of children: Not on file   Years of education: Not on file   Highest education level: Not on file  Occupational History   Occupation: cleans houses  Tobacco Use   Smoking status: Never   Smokeless tobacco: Never  Substance and Sexual Activity   Alcohol use: Yes   Drug use: No   Sexual activity: Not on file  Other Topics Concern   Not on file  Social History Narrative   Not on file   Social Determinants of Health   Financial Resource Strain: Not on file  Food Insecurity: Not on file  Transportation Needs: Not on file  Physical Activity: Not on file  Stress: Not on file  Social Connections: Not on file  Intimate Partner Violence: Not on file    Outpatient Medications Prior to Visit  Medication Sig Dispense Refill   amLODipine (NORVASC) 10 MG tablet Take 1 tablet (10 mg total) by mouth daily. 90 tablet 2   aspirin EC 81 MG EC tablet Take 1 tablet (81 mg total) by mouth daily with breakfast. 30 tablet 11   blood glucose meter kit and supplies KIT Dispense based on patient and insurance preference. Use  up to four times daily as directed. (FOR ICD-9 250.00, 250.01). 1 each 0   blood glucose meter kit and supplies Relion Prime or Dispense other brand based on patient and insurance preference. Use up to four times daily as directed. (FOR ICD-9 250.00, 250.01). 1 each 11   ezetimibe (ZETIA) 10 MG tablet Take 1 tablet (10 mg total) by mouth daily. 90 tablet 3   insulin isophane & regular human (NOVOLIN 70/30 FLEXPEN) (70-30) 100 UNIT/ML KwikPen Inject 22 Units into the skin in the morning and at bedtime. 15 mL 11   lisinopril (ZESTRIL) 40 MG tablet Take 1 tablet (40 mg total) by mouth daily. 90 tablet 2   metFORMIN (GLUCOPHAGE) 500 MG tablet TAKE 2 TABLETS(1000 MG) BY MOUTH TWICE DAILY  WITH A MEAL 120 tablet 0   metoprolol tartrate (LOPRESSOR) 100 MG tablet TAKE 1 TABLET BY MOUTH IN THE MORNING, AT NOON AND AT BEDTIME 270 tablet 3   rosuvastatin (CRESTOR) 20 MG tablet Take 0.5 tablets (10 mg total) by mouth daily. 45 tablet 3   ticagrelor (BRILINTA) 60 MG TABS tablet Take 1 tablet (60 mg total) by mouth 2 (two) times daily. 180 tablet 3   NONFORMULARY OR COMPOUNDED ITEM lysulin  1 po tid 90 each 2   Semaglutide (RYBELSUS) 3 MG TABS Take 3 mg by mouth daily. 30 tablet 0   No facility-administered medications prior to visit.    No Known Allergies  Review of Systems  Constitutional:  Negative for fever, malaise/fatigue and weight loss.  HENT:  Negative for congestion, ear pain, sinus pain and sore throat.   Respiratory:  Negative for sputum production, shortness of breath, wheezing and stridor.   Gastrointestinal:  Negative for abdominal pain, constipation, diarrhea, nausea and vomiting.  Genitourinary:  Negative for flank pain, frequency, hematuria and urgency.  Skin:  Negative for itching and rash.  Neurological:  Negative for dizziness, tremors and headaches.      Objective:    Physical Exam  BP (!) 144/88   Pulse 80   Ht _0  (1.651 m)   LMP  (LMP Unknown)   BMI 34.05 kg/m  Wt Readings from Last 3 Encounters:  04/09/20 204 lb 9.6 oz (92.8 kg)  01/08/20 207 lb (93.9 kg)  10/21/19 204 lb 9.6 oz (92.8 kg)    Diabetic Foot Exam - Simple   No data filed    Lab Results  Component Value Date   WBC 7.0 10/21/2019   HGB 13.5 10/21/2019   HCT 41.6 10/21/2019   PLT 287.0 10/21/2019   GLUCOSE 106 (H) 01/08/2020   CHOL 124 10/04/2020   TRIG 112 10/04/2020   HDL 42 10/04/2020   LDLDIRECT 131.0 01/08/2020   LDLCALC 62 10/04/2020   ALT 42 (H) 12/16/2020   AST 37 12/16/2020   NA 140 01/08/2020   K 4.7 01/08/2020   CL 106 01/08/2020   CREATININE 1.03 01/08/2020   BUN 15 01/08/2020   CO2 25 01/08/2020   TSH 1.66 10/21/2019   HGBA1C 7.6 (H) 01/08/2020    MICROALBUR 30.8 (H) 10/21/2019    Lab Results  Component Value Date   TSH 1.66 10/21/2019   Lab Results  Component Value Date   WBC 7.0 10/21/2019   HGB 13.5 10/21/2019   HCT 41.6 10/21/2019   MCV 75.5 (L) 10/21/2019   PLT 287.0 10/21/2019   Lab Results  Component Value Date   NA 140 01/08/2020   K 4.7 01/08/2020   CO2 25 01/08/2020  GLUCOSE 106 (H) 01/08/2020   BUN 15 01/08/2020   CREATININE 1.03 01/08/2020   BILITOT 0.2 12/16/2020   ALKPHOS 113 12/16/2020   AST 37 12/16/2020   ALT 42 (H) 12/16/2020   PROT 7.4 12/16/2020   ALBUMIN 4.6 12/16/2020   CALCIUM 9.8 01/08/2020   ANIONGAP 9 09/17/2018   GFR 66.99 01/08/2020   Lab Results  Component Value Date   CHOL 124 10/04/2020   Lab Results  Component Value Date   HDL 42 10/04/2020   Lab Results  Component Value Date   LDLCALC 62 10/04/2020   Lab Results  Component Value Date   TRIG 112 10/04/2020   Lab Results  Component Value Date   CHOLHDL 3.0 10/04/2020   Lab Results  Component Value Date   HGBA1C 7.6 (H) 01/08/2020       Assessment & Plan:   Problem List Items Addressed This Visit       Unprioritized   Essential hypertension    Well controlled, no changes to meds. Encouraged heart healthy diet such as the DASH diet and exercise as tolerated.        Hyperlipidemia associated with type 2 diabetes mellitus ([place])    Tolerating statin, encouraged heart healthy diet, avoid trans fats, minimize simple carbs and saturated fats. Increase exercise as tolerated       Relevant Orders   Hemoglobin A1c   Lipid panel   Comprehensive metabolic panel   Hyperlipidemia LDL goal <100    Encourage heart healthy diet such as MIND or DASH diet, increase exercise, avoid trans fats, simple carbohydrates and processed foods, consider a krill or fish or flaxseed oil cap daily.        Pain in joint, multiple sites - Primary    Tylenol prn Check labs  voltaren gell        Relevant Orders    Rheumatoid Factor   Antinuclear Antib (ANA)   Sedimentation rate   Hemoglobin A1c   Lipid panel   Comprehensive metabolic panel   DG Hand Complete Left   Uncontrolled type 2 diabetes mellitus with hyperglycemia (HCC)    hgba1c to be done, minimize simple carbs. Increase exercise as tolerated. Continue current meds        Relevant Orders   Hemoglobin A1c   Lipid panel   Comprehensive metabolic panel   Other Visit Diagnoses     Primary hypertension             No orders of the defined types were placed in this encounter.   I discussed the assessment and treatment plan with the patient. The patient was provided an opportunity to ask questions and all were answered. The patient agreed with the plan and demonstrated an understanding of the instructions.   The patient was advised to call back or seek an in-person evaluation if the symptoms worsen or if the condition fails to improve as anticipated   Ann Held, [place] at [employer] [facility] [phone] (phone) [phone] (fax)  [place]

## 2021-01-14 NOTE — Assessment & Plan Note (Signed)
Tylenol prn Check labs  voltaren gell

## 2021-01-14 NOTE — Assessment & Plan Note (Signed)
Well controlled, no changes to meds. Encouraged heart healthy diet such as the DASH diet and exercise as tolerated.  °

## 2021-01-14 NOTE — Assessment & Plan Note (Signed)
hgba1c to be done, minimize simple carbs. Increase exercise as tolerated. Continue current meds  

## 2021-01-14 NOTE — Assessment & Plan Note (Signed)
Encourage heart healthy diet such as MIND or DASH diet, increase exercise, avoid trans fats, simple carbohydrates and processed foods, consider a krill or fish or flaxseed oil cap daily.  °

## 2021-01-14 NOTE — Assessment & Plan Note (Signed)
Tolerating statin, encouraged heart healthy diet, avoid trans fats, minimize simple carbs and saturated fats. Increase exercise as tolerated 

## 2021-01-17 ENCOUNTER — Other Ambulatory Visit: Payer: Self-pay | Admitting: Family Medicine

## 2021-01-17 ENCOUNTER — Encounter: Payer: Self-pay | Admitting: Family Medicine

## 2021-01-17 DIAGNOSIS — M25531 Pain in right wrist: Secondary | ICD-10-CM

## 2021-01-27 ENCOUNTER — Ambulatory Visit: Payer: Self-pay

## 2021-01-27 ENCOUNTER — Other Ambulatory Visit: Payer: Self-pay

## 2021-01-27 ENCOUNTER — Ambulatory Visit (INDEPENDENT_AMBULATORY_CARE_PROVIDER_SITE_OTHER): Payer: 59 | Admitting: Surgery

## 2021-01-27 DIAGNOSIS — M2550 Pain in unspecified joint: Secondary | ICD-10-CM

## 2021-01-27 DIAGNOSIS — M25531 Pain in right wrist: Secondary | ICD-10-CM | POA: Diagnosis not present

## 2021-01-27 DIAGNOSIS — M25512 Pain in left shoulder: Secondary | ICD-10-CM | POA: Diagnosis not present

## 2021-01-27 DIAGNOSIS — G8929 Other chronic pain: Secondary | ICD-10-CM

## 2021-01-27 DIAGNOSIS — M25511 Pain in right shoulder: Secondary | ICD-10-CM | POA: Diagnosis not present

## 2021-01-27 DIAGNOSIS — M25532 Pain in left wrist: Secondary | ICD-10-CM

## 2021-01-27 NOTE — Progress Notes (Signed)
Office Visit Note   Patient: Danielle Flores           Date of Birth: 1966-07-18           MRN: [id number] Visit Date: 01/27/2021              Requested by: [address] [place], [place] [address] [place],  [place] [address] PCP: Carollee Herter, Alferd Apa, DO   Assessment & Plan: Visit Diagnoses:  1. Chronic right shoulder pain   2. Chronic left shoulder pain   3. Pain in right wrist   4. Pain in left wrist   5. Polyarthralgia     Plan: With patient's polyarthralgias I ordered a CBC and arthritis panel.  Patient also has a history of sickle cell trait.  I will have her follow-up in the office with Dr. Lorin Mercy in 3 weeks to discuss lab results.  Patient unable to take oral NSAIDs due to being on Brilinta.  Follow-Up Instructions: Return in about 3 weeks (around 02/17/2021) for with dr yates to review labs and recheck polyarthralgia.   Orders:  Orders Placed This Encounter  Procedures   XR Shoulder Right   XR Shoulder Left   XR Wrist 2 Views Right   XR Wrist 2 Views Left   CBC with Differential   Antinuclear Antib (ANA)   Sed Rate (ESR)   Rheumatoid Factor   Uric acid   Anti-nuclear ab-titer (ANA titer)   No orders of the defined types were placed in this encounter.     Procedures: No procedures performed   Clinical Data: No additional findings.   Subjective: Chief Complaint  Patient presents with   Left Wrist - Pain   Right Wrist - Pain    HPI 55 year old white female comes in today with complaints of bilateral wrist and bilateral shoulder pain.  States that she had pain off and on about 2 years.  No specific injury.  Pain when she lifts objects.  Problems mentioned to her primary care provider and patient states that she recently had labs.  I do not see that she had an arthritis panel.  Patient has a history of sickle cell trait.  Patient is also currently taking Brilinta and not able to use oral NSAIDs. Review of Systems No current cardiac  pulmonary GI GU  Objective: Vital Signs: LMP  (LMP Unknown)   Physical Exam HENT:     Head: Normocephalic.  Eyes:     Extraocular Movements: Extraocular movements intact.  Pulmonary:     Effort: No respiratory distress.  Musculoskeletal:     Comments: Gait is normal.  Good cervical spine range of motion.  Mild bilateral shoulder impingement testing.  Negative drop arm test.  Good cuff strength.  Bilateral wrist no swelling.  Both wrists are tender to palpation.  Right elbow good range of motion.  Elbow joint somewhat tender to palpation.  No swelling.  Neurological:     Mental Status: She is alert and oriented to person, place, and time.  Psychiatric:        Mood and Affect: Mood normal.    Ortho Exam  Specialty Comments:  No specialty comments available.  Imaging: No results found.   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   GENERALIZED ANXIETY DISORDER 10/13/2010   CARDIAC MURMUR 10/13/2010   CHEST PAIN, HX OF 02/28/2008   ELBOW PAIN, RIGHT 11/01/2007   Hyperlipidemia LDL goal <100 10/14/2007   HYPERGLYCEMIA 10/14/2007   SICKLE CELL TRAIT 01/24/2007   Essential hypertension 01/24/2007   POSTPROCEDURAL STATUS NEC 01/24/2007   Past Medical History:  Diagnosis Date   Hyperglycemia    Hyperlipidemia    Hypertension     Family History  Problem Relation Age of Onset   Diabetes Other        1st  degree relative   Heart disease Maternal Grandmother        heart enlargement   Sickle cell anemia Paternal Aunt     Past Surgical History:  Procedure Laterality Date   CESAREAN SECTION     CORONARY STENT INTERVENTION N/A 09/16/2018   Procedure: CORONARY STENT INTERVENTION;  Surgeon: Varanasi, Jayadeep S, MD;  Location: [facility];  Service: Cardiovascular;  Laterality: N/A;   LEFT HEART CATH AND CORONARY ANGIOGRAPHY N/A 09/16/2018   Procedure: LEFT HEART CATH AND CORONARY ANGIOGRAPHY;  Surgeon: Varanasi, Jayadeep S, MD;  Location: [facility];  Service: Cardiovascular;  Laterality: N/A;   Social History   Occupational History   Occupation: cleans houses  Tobacco Use   Smoking status: Never   Smokeless tobacco: Never  Substance and Sexual Activity   Alcohol use: Yes   Drug use: No   Sexual activity: Not on file        

## 2021-01-29 LAB — CBC WITH DIFFERENTIAL/PLATELET
Absolute Monocytes: 817 cells/uL (ref 200–950)
Basophils Absolute: 34 cells/uL (ref 0–200)
Basophils Relative: 0.4 %
Eosinophils Absolute: 275 cells/uL (ref 15–500)
Eosinophils Relative: 3.2 %
HCT: 42.7 % (ref 35.0–45.0)
Hemoglobin: 13.5 g/dL (ref 11.7–15.5)
Lymphs Abs: 3268 cells/uL (ref 850–3900)
MCH: 23.5 pg — ABNORMAL LOW (ref 27.0–33.0)
MCHC: 31.6 g/dL — ABNORMAL LOW (ref 32.0–36.0)
MCV: 74.4 fL — ABNORMAL LOW (ref 80.0–100.0)
MPV: 11 fL (ref 7.5–12.5)
Monocytes Relative: 9.5 %
Neutro Abs: 4205 cells/uL (ref 1500–7800)
Neutrophils Relative %: 48.9 %
Platelets: 337 10*3/uL (ref 140–400)
RBC: 5.74 10*6/uL — ABNORMAL HIGH (ref 3.80–5.10)
RDW: 16.2 % — ABNORMAL HIGH (ref 11.0–15.0)
Total Lymphocyte: 38 %
WBC: 8.6 10*3/uL (ref 3.8–10.8)

## 2021-01-29 LAB — URIC ACID: Uric Acid, Serum: 7.2 mg/dL — ABNORMAL HIGH (ref 2.5–7.0)

## 2021-01-29 LAB — RHEUMATOID FACTOR: Rheumatoid fact SerPl-aCnc: <14 IU/mL (ref <14)

## 2021-01-29 LAB — SEDIMENTATION RATE: Sed Rate: 25 mm/h (ref 0–30)

## 2021-01-29 LAB — ANTI-NUCLEAR AB-TITER (ANA TITER): ANA Titer 1: 1:320 {titer} — ABNORMAL HIGH

## 2021-01-29 LAB — ANA: Anti Nuclear Antibody (ANA): POSITIVE — AB

## 2021-01-30 ENCOUNTER — Other Ambulatory Visit: Payer: Self-pay | Admitting: Family Medicine

## 2021-01-30 DIAGNOSIS — E790 Hyperuricemia without signs of inflammatory arthritis and tophaceous disease: Secondary | ICD-10-CM

## 2021-02-02 ENCOUNTER — Other Ambulatory Visit: Payer: Self-pay

## 2021-02-02 DIAGNOSIS — R768 Other specified abnormal immunological findings in serum: Secondary | ICD-10-CM

## 2021-02-02 MED ORDER — ALLOPURINOL 100 MG PO TABS: 100.0000 mg | ORAL_TABLET | Freq: Every day | ORAL | 0 refills | Status: DC

## 2021-02-10 ENCOUNTER — Other Ambulatory Visit: Payer: Self-pay | Admitting: Interventional Cardiology

## 2021-02-10 DIAGNOSIS — I10 Essential (primary) hypertension: Secondary | ICD-10-CM

## 2021-02-18 ENCOUNTER — Ambulatory Visit (INDEPENDENT_AMBULATORY_CARE_PROVIDER_SITE_OTHER): Payer: 59 | Admitting: Orthopaedic Surgery

## 2021-02-18 ENCOUNTER — Other Ambulatory Visit: Payer: Self-pay

## 2021-02-18 DIAGNOSIS — M25531 Pain in right wrist: Secondary | ICD-10-CM | POA: Diagnosis not present

## 2021-02-18 DIAGNOSIS — M25532 Pain in left wrist: Secondary | ICD-10-CM

## 2021-02-18 NOTE — Progress Notes (Signed)
Office Visit Note   Patient: Danielle Flores           Date of Birth: 06/01/1966           MRN: [id number] Visit Date: 02/18/2021              Requested by: [address] [place], [place] [address] [place],  [place] [address] PCP: Zola Button, Grayling Congress, DO   Assessment & Plan: Visit Diagnoses:  1. Pain in left wrist   2. Pain in right wrist   3.  Shoulder pain  Plan: We reviewed the patient's labs.  She has slight elevation of uric acid but her history and symptoms would not suggest that this is gout.  She has some elevation of her ANA titer.  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  Currently she states she is feeling much better is gotten good relief of her symptoms.  If she has persistent multijoint complaints then rheumatologic referral be recommended.  Follow-Up Instructions: No follow-ups on file.   Orders:  No orders of the defined types were placed in this encounter.  No orders of the defined types were placed in this encounter.     Procedures: No procedures performed   Clinical Data: No additional findings.   Subjective: Chief Complaint  Patient presents with   Right Shoulder - Follow-up   Left Shoulder - Follow-up   Right Wrist - Follow-up   Left Wrist - Follow-up    HPI 55 year old female returns with multijoint complaints with bilateral shoulder and bilateral wrist pain.  ANA titer was 1-3 20 with a nuclear speckled pattern.  Uric acid slightly elevated at 7.2 normal is 2.5-7.0.      Sedimentation rate 25.  She was started on allopurinol 100 mg 1 p.o. daily and recheck uric acid in 1 month.  She denies any foot or ankle or knee problems.  Review of Systems all other systems noncontributory to HPI.  I have noted her sickle cell trait diabetes type 2 hyperglycemia.   Objective: Vital Signs: BP (!) 163/93   Pulse 73   LMP  (LMP Unknown)   Physical Exam Constitutional:      Appearance: She is well-developed.  HENT:     Head:  Normocephalic.     Right Ear: External ear normal.     Left Ear: External ear normal. There is no impacted cerumen.  Eyes:     Pupils: Pupils are equal, round, and reactive to light.  Neck:     Thyroid: No thyromegaly.     Trachea: No tracheal deviation.  Cardiovascular:     Rate and Rhythm: Normal rate.  Pulmonary:     Effort: Pulmonary effort is normal.  Abdominal:     Palpations: Abdomen is soft.  Musculoskeletal:     Cervical back: No rigidity.  Skin:    General: Skin is warm and dry.  Neurological:     Mental Status: She is alert and oriented to person, place, and time.  Psychiatric:        Behavior: Behavior normal.    Ortho Exam good cervical range of motion negative impingement right and left shoulders.  No synovitis of the wrist.  TFCC is normal.  Dorsal compartments are normal right and left wrist.  Specialty Comments:  No specialty comments available.  Imaging: No results found.   PMFS History: Patient Active Problem List   Diagnosis Date Noted   Pain in left wrist 02/20/2021  Pain in right wrist 02/20/2021   Pain in joint, multiple sites 01/14/2021   Hyperlipidemia associated with type 2 diabetes mellitus (HCC) 01/14/2021   Uncontrolled type 2 diabetes mellitus with hyperglycemia (HCC) 01/14/2021   CAD in native artery/Stent 09/16/2018 09/16/2018   DKA, type 2 (HCC) 09/13/2018   NSTEMI (non-ST elevated myocardial infarction) (HCC) 09/13/2018   DKA (diabetic ketoacidoses) 09/13/2018   Elevated troponin    DM (diabetes mellitus) type II uncontrolled, periph vascular disorder (HCC) 08/06/2017   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   GENERALIZED ANXIETY DISORDER 10/13/2010   CARDIAC MURMUR 10/13/2010   CHEST PAIN, HX OF 02/28/2008   ELBOW PAIN, RIGHT 11/01/2007   Hyperlipidemia LDL goal <100 10/14/2007   HYPERGLYCEMIA 10/14/2007   SICKLE CELL TRAIT 01/24/2007   Essential hypertension 01/24/2007   POSTPROCEDURAL STATUS NEC 01/24/2007    Past Medical History:  Diagnosis Date   Hyperglycemia    Hyperlipidemia    Hypertension     Family History  Problem Relation Age of Onset   Diabetes Other        1st degree relative   Heart disease Maternal Grandmother        heart enlargement   Sickle cell anemia Paternal Aunt     Past Surgical History:  Procedure Laterality Date   CESAREAN SECTION     CORONARY STENT INTERVENTION N/A 09/16/2018   Procedure: CORONARY STENT INTERVENTION;  Surgeon: Corky Crafts, MD;  Location: [facility];  Service: Cardiovascular;  Laterality: N/A;   LEFT HEART CATH AND CORONARY ANGIOGRAPHY N/A 09/16/2018   Procedure: LEFT HEART CATH AND CORONARY ANGIOGRAPHY;  Surgeon: Corky Crafts, MD;  Location: [facility];  Service: Cardiovascular;  Laterality: N/A;   Social History   Occupational History   Occupation: cleans houses  Tobacco Use   Smoking status: Never   Smokeless tobacco: Never  Substance and Sexual Activity   Alcohol use: Yes   Drug use: No   Sexual activity: Not on file

## 2021-02-20 DIAGNOSIS — M25531 Pain in right wrist: Secondary | ICD-10-CM | POA: Insufficient documentation

## 2021-02-20 DIAGNOSIS — M25532 Pain in left wrist: Secondary | ICD-10-CM | POA: Insufficient documentation

## 2021-03-04 ENCOUNTER — Other Ambulatory Visit: Payer: Self-pay

## 2021-03-04 ENCOUNTER — Encounter: Payer: Self-pay | Admitting: Family Medicine

## 2021-03-04 ENCOUNTER — Ambulatory Visit (INDEPENDENT_AMBULATORY_CARE_PROVIDER_SITE_OTHER): Payer: 59 | Admitting: Family Medicine

## 2021-03-04 VITALS — BP 160/84 | HR 70 | Temp 98.6°F | Resp 16 | Ht 65.0 in | Wt 218.0 lb

## 2021-03-04 DIAGNOSIS — E1169 Type 2 diabetes mellitus with other specified complication: Secondary | ICD-10-CM | POA: Diagnosis not present

## 2021-03-04 DIAGNOSIS — I10 Essential (primary) hypertension: Secondary | ICD-10-CM | POA: Diagnosis not present

## 2021-03-04 DIAGNOSIS — E1151 Type 2 diabetes mellitus with diabetic peripheral angiopathy without gangrene: Secondary | ICD-10-CM

## 2021-03-04 DIAGNOSIS — Z23 Encounter for immunization: Secondary | ICD-10-CM

## 2021-03-04 DIAGNOSIS — E1165 Type 2 diabetes mellitus with hyperglycemia: Secondary | ICD-10-CM | POA: Diagnosis not present

## 2021-03-04 DIAGNOSIS — E785 Hyperlipidemia, unspecified: Secondary | ICD-10-CM | POA: Diagnosis not present

## 2021-03-04 DIAGNOSIS — IMO0002 Reserved for concepts with insufficient information to code with codable children: Secondary | ICD-10-CM

## 2021-03-04 DIAGNOSIS — M25532 Pain in left wrist: Secondary | ICD-10-CM

## 2021-03-04 DIAGNOSIS — R768 Other specified abnormal immunological findings in serum: Secondary | ICD-10-CM

## 2021-03-04 DIAGNOSIS — Z1211 Encounter for screening for malignant neoplasm of colon: Secondary | ICD-10-CM

## 2021-03-04 DIAGNOSIS — M25531 Pain in right wrist: Secondary | ICD-10-CM

## 2021-03-04 LAB — LIPID PANEL
Cholesterol: 198 mg/dL (ref 0–200)
HDL: 40.8 mg/dL (ref >39.00)
LDL Cholesterol: 124 mg/dL — ABNORMAL HIGH (ref 0–99)
NonHDL: 157.3
Total CHOL/HDL Ratio: 5
Triglycerides: 166 mg/dL — ABNORMAL HIGH (ref 0.0–149.0)
VLDL: 33.2 mg/dL (ref 0.0–40.0)

## 2021-03-04 LAB — CBC WITH DIFFERENTIAL/PLATELET
Basophils Absolute: 0 10*3/uL (ref 0.0–0.1)
Basophils Relative: 0.7 % (ref 0.0–3.0)
Eosinophils Absolute: 0.2 10*3/uL (ref 0.0–0.7)
Eosinophils Relative: 3.2 % (ref 0.0–5.0)
HCT: 39.8 % (ref 36.0–46.0)
Hemoglobin: 12.8 g/dL (ref 12.0–15.0)
Lymphocytes Relative: 40.1 % (ref 12.0–46.0)
Lymphs Abs: 2.6 10*3/uL (ref 0.7–4.0)
MCHC: 32.2 g/dL (ref 30.0–36.0)
MCV: 72.6 fl — ABNORMAL LOW (ref 78.0–100.0)
Monocytes Absolute: 0.5 10*3/uL (ref 0.1–1.0)
Monocytes Relative: 8.5 % (ref 3.0–12.0)
Neutro Abs: 3 10*3/uL (ref 1.4–7.7)
Neutrophils Relative %: 47.5 % (ref 43.0–77.0)
Platelets: 271 10*3/uL (ref 150.0–400.0)
RBC: 5.49 Mil/uL — ABNORMAL HIGH (ref 3.87–5.11)
RDW: 16.4 % — ABNORMAL HIGH (ref 11.5–15.5)
WBC: 6.4 10*3/uL (ref 4.0–10.5)

## 2021-03-04 LAB — COMPREHENSIVE METABOLIC PANEL
ALT: 55 U/L — ABNORMAL HIGH (ref 0–35)
AST: 45 U/L — ABNORMAL HIGH (ref 0–37)
Albumin: 4.4 g/dL (ref 3.5–5.2)
Alkaline Phosphatase: 109 U/L (ref 39–117)
BUN: 12 mg/dL (ref 6–23)
CO2: 28 mEq/L (ref 19–32)
Calcium: 10 mg/dL (ref 8.4–10.5)
Chloride: 101 mEq/L (ref 96–112)
Creatinine, Ser: 0.9 mg/dL (ref 0.40–1.20)
GFR: 70.98 mL/min (ref >60.00)
Glucose, Bld: 165 mg/dL — ABNORMAL HIGH (ref 70–99)
Potassium: 3.9 mEq/L (ref 3.5–5.1)
Sodium: 140 mEq/L (ref 135–145)
Total Bilirubin: 0.3 mg/dL (ref 0.2–1.2)
Total Protein: 7.6 g/dL (ref 6.0–8.3)

## 2021-03-04 LAB — HEMOGLOBIN A1C: Hgb A1c MFr Bld: 8.2 % — ABNORMAL HIGH (ref 4.6–6.5)

## 2021-03-04 NOTE — Assessment & Plan Note (Signed)
Pain resolved.   

## 2021-03-04 NOTE — Assessment & Plan Note (Signed)
Resolved

## 2021-03-04 NOTE — Assessment & Plan Note (Signed)
hgba1c to be checked, minimize simple carbs. Increase exercise as tolerated. Continue current meds  

## 2021-03-04 NOTE — Progress Notes (Signed)
Patient ID: Danielle Flores, female    DOB: [birth year]  Age: 55 y.o. MRN: [id number]    Subjective:  Subjective  HPI Danielle Flores presents for an office visit and f/u for gout and uric acid today. She reports feeling well and has no complaints. She denies any arthralgia, myalgia, or any pain related sxs.  She endorses taking 500 mg GLUCOPHAGE PO BID and NOVOLIN 70/30 FLEXPEN SQ QD for her dx of T2DM.  She notes that her at home blood sugar level is "good".  Lab Results  Component Value Date   HGBA1C 7.6 (H) 01/08/2020  She notes that she is trying to lose weight and is planning on attending healthy weight and wellness.  Wt Readings from Last 3 Encounters:  03/04/21 218 lb (98.9 kg)  04/09/20 204 lb 9.6 oz (92.8 kg)  01/08/20 207 lb (93.9 kg)    Pt is planning on receiving a colonoscopy. She denies any GI sxs.  Pt doesn't wish to receive a pneumonia vaccine.  Pt agree to Tdap vaccine. Pt is not UTD with her ophthalmologist.    Review of Systems  Constitutional:  Negative for chills, fatigue and fever.  HENT:  Negative for ear pain, rhinorrhea, sinus pressure, sinus pain, sore throat and tinnitus.   Eyes:  Negative for pain.  Respiratory:  Negative for cough, shortness of breath and wheezing.   Cardiovascular:  Negative for chest pain.  Gastrointestinal:  Negative for abdominal pain, anal bleeding, constipation, diarrhea, nausea and vomiting.  Genitourinary:  Negative for flank pain.  Musculoskeletal:  Negative for arthralgias, back pain, gait problem, joint swelling, myalgias and neck pain.  Skin:  Negative for rash.  Neurological:  Negative for seizures, weakness, light-headedness, numbness and headaches.   History Past Medical History:  Diagnosis Date   Hyperglycemia    Hyperlipidemia    Hypertension     She has a past surgical history that includes Cesarean section; LEFT HEART CATH AND CORONARY ANGIOGRAPHY (N/A, 09/16/2018); and CORONARY STENT INTERVENTION (N/A,  09/16/2018).   Her family history includes Diabetes in an other family member; Heart disease in her maternal grandmother; Sickle cell anemia in her paternal aunt.She reports that she has never smoked. She has never used smokeless tobacco. She reports current alcohol use. She reports that she does not use drugs.  Current Outpatient Medications on File Prior to Visit  Medication Sig Dispense Refill   amLODipine (NORVASC) 10 MG tablet TAKE 1 TABLET(10 MG) BY MOUTH DAILY 90 tablet 0   aspirin EC 81 MG EC tablet Take 1 tablet (81 mg total) by mouth daily with breakfast. 30 tablet 11   blood glucose meter kit and supplies KIT Dispense based on patient and insurance preference. Use up to four times daily as directed. (FOR ICD-9 250.00, 250.01). 1 each 0   blood glucose meter kit and supplies Relion Prime or Dispense other brand based on patient and insurance preference. Use up to four times daily as directed. (FOR ICD-9 250.00, 250.01). 1 each 11   ezetimibe (ZETIA) 10 MG tablet Take 1 tablet (10 mg total) by mouth daily. 90 tablet 3   insulin isophane & regular human (NOVOLIN 70/30 FLEXPEN) (70-30) 100 UNIT/ML KwikPen Inject 22 Units into the skin in the morning and at bedtime. 15 mL 11   lisinopril (ZESTRIL) 40 MG tablet TAKE 1 TABLET(40 MG) BY MOUTH DAILY 90 tablet 0   metFORMIN (GLUCOPHAGE) 500 MG tablet TAKE 2 TABLETS(1000 MG) BY MOUTH TWICE DAILY WITH A MEAL 120  tablet 0   metoprolol tartrate (LOPRESSOR) 100 MG tablet TAKE 1 TABLET BY MOUTH IN THE MORNING, AT NOON AND AT BEDTIME 270 tablet 3   rosuvastatin (CRESTOR) 20 MG tablet Take 0.5 tablets (10 mg total) by mouth daily. 45 tablet 3   ticagrelor (BRILINTA) 60 MG TABS tablet Take 1 tablet (60 mg total) by mouth 2 (two) times daily. 180 tablet 3   No current facility-administered medications on file prior to visit.     Objective:  Objective  Physical Exam Vitals and nursing note reviewed.  Constitutional:      General: She is not in acute  distress.    Appearance: Normal appearance. She is well-developed. She is not ill-appearing.  HENT:     Head: Normocephalic and atraumatic.     Right Ear: External ear normal.     Left Ear: External ear normal.     Nose: Nose normal.  Eyes:     General:        Right eye: No discharge.        Left eye: No discharge.     Extraocular Movements: Extraocular movements intact.     Pupils: Pupils are equal, round, and reactive to light.  Cardiovascular:     Rate and Rhythm: Normal rate and regular rhythm.     Pulses: Normal pulses.     Heart sounds: Normal heart sounds. No murmur heard.   No friction rub. No gallop.  Pulmonary:     Effort: Pulmonary effort is normal. No respiratory distress.     Breath sounds: Normal breath sounds. No stridor. No wheezing, rhonchi or rales.  Chest:     Chest wall: No tenderness.  Abdominal:     General: Bowel sounds are normal. There is no distension.     Palpations: Abdomen is soft. There is no mass.     Tenderness: There is no abdominal tenderness. There is no guarding or rebound.     Hernia: No hernia is present.  Musculoskeletal:        General: Normal range of motion.     Cervical back: Normal range of motion and neck supple.     Right lower leg: No edema.     Left lower leg: No edema.  Skin:    General: Skin is warm and dry.  Neurological:     Mental Status: She is alert and oriented to person, place, and time.  Psychiatric:        Behavior: Behavior normal.        Thought Content: Thought content normal.   BP (!) 160/84 (BP Location: Right Arm, Patient Position: Sitting, Cuff Size: Large)   Pulse 70   Temp 98.6 F (37 C) (Oral)   Resp 16   Ht '5\' 5"'  (1.651 m)   Wt 218 lb (98.9 kg)   LMP  (LMP Unknown)   SpO2 100%   BMI 36.28 kg/m  Wt Readings from Last 3 Encounters:  03/04/21 218 lb (98.9 kg)  04/09/20 204 lb 9.6 oz (92.8 kg)  01/08/20 207 lb (93.9 kg)     Lab Results  Component Value Date   WBC 8.6 01/27/2021   HGB 13.5  01/27/2021   HCT 42.7 01/27/2021   PLT 337 01/27/2021   GLUCOSE 106 (H) 01/08/2020   CHOL 124 10/04/2020   TRIG 112 10/04/2020   HDL 42 10/04/2020   LDLDIRECT 131.0 01/08/2020   LDLCALC 62 10/04/2020   ALT 42 (H) 12/16/2020   AST 37 12/16/2020  NA 140 01/08/2020   K 4.7 01/08/2020   CL 106 01/08/2020   CREATININE 1.03 01/08/2020   BUN 15 01/08/2020   CO2 25 01/08/2020   TSH 1.66 10/21/2019   HGBA1C 7.6 (H) 01/08/2020   MICROALBUR 30.8 (H) 10/21/2019    CT Head Wo Contrast  Result Date: 09/13/2018 CLINICAL DATA:  Slurred speech EXAM: CT HEAD WITHOUT CONTRAST TECHNIQUE: Contiguous axial images were obtained from the base of the skull through the vertex without intravenous contrast. COMPARISON:  None. FINDINGS: Brain: No evidence of acute infarction, hemorrhage, extra-axial collection, ventriculomegaly, or mass effect. Old right basal ganglia lacunar infarct. Generalized cerebral atrophy. Periventricular white matter low attenuation likely secondary to microangiopathy. Vascular: No hyperdense vessel. No significant intracranial atherosclerotic disease. Skull: Negative for fracture or focal lesion. Sinuses/Orbits: Visualized portions of the orbits are unremarkable. Visualized portions of the paranasal sinuses and mastoid air cells are unremarkable. Other: None. IMPRESSION: No acute intracranial pathology. Electronically Signed   By: Kathreen Devoid   On: 09/13/2018 01:36   DG Chest Port 1 View  Result Date: 09/13/2018 CLINICAL DATA:  Slurred speech, hand cramping for 48 hours EXAM: PORTABLE CHEST 1 VIEW COMPARISON:  None. FINDINGS: The heart size and mediastinal contours are within normal limits. Both lungs are clear. The visualized skeletal structures are unremarkable. IMPRESSION: No active disease. Electronically Signed   By: Kathreen Devoid   On: 09/13/2018 01:34   ECHOCARDIOGRAM COMPLETE  Result Date: 09/13/2018   ECHOCARDIOGRAM REPORT   Patient Name:   Danielle Flores Date of Exam: 09/13/2018  Medical Rec #:  [id number]        Height:       66.0 in Accession #:    [id number]       Weight:       189.0 lb Date of Birth:  1966/01/04         BSA:          1.95 m Patient Age:    15 years         BP:           141/75 mmHg Patient Gender: F                HR:           76 bpm. Exam Location:  Inpatient  Procedure: 2D Echo and Strain Analysis Indications:    Elevated Troponin R79.89  History:        Patient has no prior history of Echocardiogram examinations.                 Family History of Cardiomegly, Chronic Kidney Disease II, Sickle                 Cell Anemia, Elevated Troponins, Hyperglycemia, Chest Pain.  Sonographer:    Madelaine Etienne RDCS (AE) Referring Phys: [id number] [facility]  Sonographer Comments: Uncooperative. IMPRESSIONS  1. The left ventricle has normal systolic function of 99-83%. The cavity size was normal. There is severe septal hypertrophy with otherwise moderate concentric LVH. Echo evidence of pseudonormalization in diastolic relaxation Elevated left ventricular end-diastolic pressure.  2. 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 There is a mid cavitary gradient with a peak velocity 2.18 m/s and peak gradient 19 mmHg.  3. The right ventricle has normal systolic function. The cavity was normal. There is no increase in right ventricular wall thickness.  4. The mitral valve is normal in structure.  5. The tricuspid valve is normal  in structure.  6. The aortic valve is normal in structure.  7. The pulmonic valve was normal in structure.  8. Grade 2 diastolic dysfunction. FINDINGS  Left Ventricle: The left ventricle has normal systolic function of 84-69%. The cavity size was normal. There is severe septal hypertrophy with otherwise moderate concentric LVH. 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 There is a mid cavitary gradient with  a peak velocity 2.18 m/s and peak gradient 19 mmHg. Right Ventricle: The right ventricle has normal systolic function. The cavity was normal. There is no increase in right ventricular wall thickness. Left Atrium: left atrial size was normal in size Right Atrium: right atrial size was normal in size Interatrial Septum: No atrial level shunt detected by color flow Doppler. Pericardium: There is no evidence of pericardial effusion. Mitral Valve: The mitral valve is normal in structure. Mitral valve regurgitation is trivial by color flow Doppler. Tricuspid Valve: The tricuspid valve is normal in structure. Tricuspid valve regurgitation was not visualized by color flow Doppler. Aortic Valve: The aortic valve is normal in structure. Aortic valve regurgitation was not visualized by color flow Doppler. Pulmonic Valve: The pulmonic valve was normal in structure. Pulmonic valve regurgitation is trivial by color flow Doppler. Venous: The inferior vena cava is normal in size with greater than 50% respiratory variability.  LEFT VENTRICLE PLAX 2D (Te[place])              Biplane EF (MOD) LV EF:          73.7 %       LV Biplane EF:   56.7 % LVIDd:          2.80 cm      LV A4C EF:       56.8 % LVIDs:          1.65 cm      LV A2C EF:       56.8 % LV PW:          1.25 cm LV IVS:         1.70 cm      Diastology LVOT diam:      1.70 cm      LV e' lateral:   7.94 cm/s LV SV:          22 ml        LV E/e' lateral: 12.0 LVOT Area:      2.27 cm     LV e' medial:    3.81 cm/s                              LV E/e' medial:  25.1 LV Volumes (MOD) LV area d, A2C:    31.70 cm LV area d, A4C:    32.10 cm LV area s, A2C:    18.90 cm LV area s, A4C:    19.30 cm LV major d, A2C:   8.43 cm LV major d, A4C:   8.07 cm LV major s, A2C:   7.12 cm LV major s, A4C:   6.81 cm LV vol d, MOD A2C: 95.6 ml LV vol d, MOD A4C: 103.0 ml LV vol s, MOD A2C: 41.3 ml LV vol s, MOD A4C: 44.5 ml LV SV MOD A2C:     54.3 ml LV SV MOD A4C:     103.0 ml LV SV MOD BP:  57.3 ml RIGHT VENTRICLE RV Basal diam:  3.10 cm RV Mid diam:    2.50 cm RV S prime:     10.30 cm/s TAPSE (M-mode): 2.2 cm RVSP:           17.0 mmHg LEFT ATRIUM             Index       RIGHT ATRIUM           Index LA diam:        3.70 cm 1.90 cm/m  RA Pressure: 3 mmHg LA Vol (A2C):   44.2 ml 22.64 ml/m RA Area:     11.60 cm LA Vol (A4C):   36.9 ml 18.90 ml/m RA Volume:   26.80 ml  13.73 ml/m LA Biplane Vol: 42.5 ml 21.77 ml/m  AORTIC VALVE                    PULMONIC VALVE AV Area (Vmax):    1.76 cm     PV Vmax:       1.06 m/s AV Area (Vmean):   1.73 cm     PV Peak grad:  4.5 mmHg AV Area (VTI):     1.66 cm AV Vmax:           157.00 cm/s AV Vmean:          108.500 cm/s AV VTI:            0.337 m AV Peak Grad:      9.9 mmHg AV Mean Grad:      5.0 mmHg LVOT Vmax:         122.00 cm/s LVOT Vmean:        82.500 cm/s LVOT VTI:          0.246 m LVOT/AV VTI ratio: 0.73  AORTA Ao Root diam: 3.20 cm Ao Asc diam:  3.40 cm MITRAL VALVE              TR Peak grad: 14.0 mmHg MV Area (PHT): 2.96 cm   TR Vmax:      186.77 cm/s MV PHT:        74.24 msec RVSP:         17.0 mmHg MV Decel Time: 256 msec MV E velocity: 95.50 cm/s MV A velocity: 84.00 cm/s MV E/A ratio:  1.14  Skeet Latch MD Electronically signed by Skeet Latch MD Signature Date/Time: 09/13/2018/2:30:42 PM    Final      Assessment & Plan:  Plan    No orders of the defined types were placed in this encounter.   Problem List Items Addressed This Visit       Unprioritized   DM (diabetes mellitus) type II uncontrolled, periph vascular disorder ([place])    hgba1c to be checked, minimize simple carbs. Increase exercise as tolerated. Continue current meds        Hyperlipidemia associated with type 2 diabetes mellitus ([place])    Encourage heart healthy diet such as MIND or DASH diet, increase exercise, avoid trans fats, simple carbohydrates and processed foods, consider a krill or fish or flaxseed oil cap daily.        Relevant Orders   Lipid  panel   Hemoglobin A1c   CBC with Differential/Platelet   Comprehensive metabolic panel   Pain in left wrist    Pain resolved        Pain in right wrist    Resolved        Uncontrolled type  2 diabetes mellitus with hyperglycemia ([place])    Check labs today       Other Visit Diagnoses     Elevated antinuclear antibody (ANA) level    -  Primary   Relevant Orders   Antinuclear Antib (ANA)   Lupus anticoagulant panel( [facility] [facility])   Colon cancer screening       Relevant Orders   Ambulatory referral to Gastroenterology   Type 2 diabetes mellitus with hyperglycemia, without long-term current use of insulin ([facility])       Relevant Orders   Lipid panel   Hemoglobin A1c   CBC with Differential/Platelet   Comprehensive metabolic panel   Amb Ref to Medical Weight Management   Primary hypertension       Relevant Orders   Lipid panel   Hemoglobin A1c   CBC with Differential/Platelet   Comprehensive metabolic panel   Morbid obesity ([place])       Relevant Orders   Amb Ref to Medical Weight Management   Need for tetanus booster       Relevant Orders   Tdap vaccine greater than or equal to 7yo IM (Completed)        Follow-up: Return if symptoms worsen or fail to improve, for annual exam, fasting.   I,Gordon Zheng,acting as a Education administrator for [employer], DO.,have documented all relevant documentation on the behalf of Danielle Held, DO,as directed by  Danielle Held, DO while in the presence of [place], DO, have reviewed all documentation for this visit. The documentation on 03/04/21 for the exam, diagnosis, procedures, and orders are all accurate and complete.

## 2021-03-04 NOTE — Assessment & Plan Note (Signed)
Encourage heart healthy diet such as MIND or DASH diet, increase exercise, avoid trans fats, simple carbohydrates and processed foods, consider a krill or fish or flaxseed oil cap daily.  °

## 2021-03-04 NOTE — Patient Instructions (Signed)
Antinuclear Antibody Test Why am I having this test? This is a test that is used to help diagnose systemic lupus erythematosus (SLE) and other autoimmune diseases. An autoimmune disease is a disease in which the body's own defense (immune)system attacks its organs. What is being tested? This test checks for antinuclear antibodies (ANA) in the blood. The presence of ANA is associated with several autoimmune diseases. It is seen in almost all patients with lupus. What kind of sample is taken? A blood sample is required for this test. It is usually collected by inserting a needle into a blood vessel. How are the results reported? Your test results will be reported as either positive or negative. A false-positive result can occur. A false positive is incorrect because it means that a condition is present when it is not. What do the results mean? A positive test result may mean that you have: Lupus. Other autoimmune diseases, such as rheumatoid arthritis, scleroderma, or Sjgren syndrome. Conditions that may cause a false-positive result include: Liver dysfunction. Myasthenia gravis. Infectious mononucleosis. Talk with your health care provider about what your results mean. Questions to ask your health care provider Ask your health care provider, or the department that is doing the test: When will my results be ready? How will I get my results? What are my treatment options? What other tests do I need? What are my next steps? Summary This is a test that is used to help diagnose systemic lupus erythematosus (SLE) and other autoimmune diseases. An autoimmune disease is a disease in which the body's own defense (immune)system attacks the body. This test checks for antinuclear antibodies (ANA) in the blood. The presence of ANA is associated with several autoimmune diseases. It is seen in almost all patients with lupus. Your test results will be reported as either positive or negative. Talk with  your health care provider about what your results mean. This information is not intended to replace advice given to you by your health care provider. Make sure you discuss any questions you have with your health care provider. Document Revised: 03/26/2020 Document Reviewed: 03/26/2020 Elsevier Patient Education  2022 [employer]  

## 2021-03-04 NOTE — Assessment & Plan Note (Signed)
Check labs today.

## 2021-03-06 LAB — ANA: Anti Nuclear Antibody (ANA): POSITIVE — AB

## 2021-03-06 LAB — ANTI-NUCLEAR AB-TITER (ANA TITER): ANA Titer 1: 1:160 {titer} — ABNORMAL HIGH

## 2021-03-07 ENCOUNTER — Encounter: Payer: Self-pay | Admitting: Family Medicine

## 2021-03-07 ENCOUNTER — Other Ambulatory Visit: Payer: Self-pay

## 2021-03-07 ENCOUNTER — Other Ambulatory Visit: Payer: Self-pay | Admitting: Family Medicine

## 2021-03-07 DIAGNOSIS — E1165 Type 2 diabetes mellitus with hyperglycemia: Secondary | ICD-10-CM

## 2021-03-07 DIAGNOSIS — E1169 Type 2 diabetes mellitus with other specified complication: Secondary | ICD-10-CM

## 2021-03-07 DIAGNOSIS — K7689 Other specified diseases of liver: Secondary | ICD-10-CM

## 2021-03-07 DIAGNOSIS — E785 Hyperlipidemia, unspecified: Secondary | ICD-10-CM

## 2021-03-07 MED ORDER — EMPAGLIFLOZIN 10 MG PO TABS: 10.0000 mg | ORAL_TABLET | Freq: Every day | ORAL | 2 refills | Status: DC

## 2021-03-08 ENCOUNTER — Encounter: Payer: Self-pay | Admitting: Family Medicine

## 2021-03-08 ENCOUNTER — Telehealth: Payer: Self-pay | Admitting: Pharmacist

## 2021-03-08 ENCOUNTER — Ambulatory Visit: Payer: 59 | Admitting: Pharmacist

## 2021-03-08 DIAGNOSIS — E785 Hyperlipidemia, unspecified: Secondary | ICD-10-CM

## 2021-03-08 DIAGNOSIS — E1165 Type 2 diabetes mellitus with hyperglycemia: Secondary | ICD-10-CM

## 2021-03-08 DIAGNOSIS — E1169 Type 2 diabetes mellitus with other specified complication: Secondary | ICD-10-CM

## 2021-03-08 DIAGNOSIS — I2510 Atherosclerotic heart disease of native coronary artery without angina pectoris: Secondary | ICD-10-CM

## 2021-03-08 DIAGNOSIS — E782 Mixed hyperlipidemia: Secondary | ICD-10-CM

## 2021-03-08 DIAGNOSIS — R768 Other specified abnormal immunological findings in serum: Secondary | ICD-10-CM

## 2021-03-08 NOTE — Chronic Care Management (AMB) (Signed)
Care Management   Pharmacy Note  03/08/2021 Name: Danielle Flores MRN: [id number] DOB: 1966/06/24  Subjective: Danielle Flores is a 55 y.o. year old female who is a primary care patient of Ann Held, DO. The Care Management team was consulted for assistance with care management and care coordination needs.    Engaged with patient by telephone for initial visit in response to provider referral for pharmacy case management and/or care coordination services.   The patient was given information about Care Management services today including:  Care Management services includes personalized support from designated clinical staff supervised by the patient's primary care provider, including individualized plan of care and coordination with other care providers. 24/7 contact phone numbers for assistance for urgent and routine care needs. The patient may stop case management services at any time by phone call to the office staff.  Patient agreed to services and consent obtained.  Assessment:  Review of patient status, including review of consultants reports, laboratory and other test data, was performed as part of comprehensive evaluation and provision of chronic care management services.   SDOH (Social Determinants of Health) assessments and interventions performed:  SDOH Interventions    Flowsheet Row Most Recent Value  SDOH Interventions   Financial Strain Interventions Other (Comment)  [checking on PAP for either Jaridnace or Farxiga and also Novolin or similar insulin]        Objective:  Lab Results  Component Value Date   CREATININE 0.90 03/04/2021   CREATININE 1.03 01/08/2020   CREATININE 0.86 10/21/2019    Lab Results  Component Value Date   HGBA1C 8.2 (H) 03/04/2021       Component Value Date/Time   CHOL 198 03/04/2021 1337   CHOL 124 10/04/2020 0854   TRIG 166.0 (H) 03/04/2021 1337   HDL 40.80 03/04/2021 1337   HDL 42 10/04/2020 0854   CHOLHDL 5  03/04/2021 1337   VLDL 33.2 03/04/2021 1337   LDLCALC 124 (H) 03/04/2021 1337   LDLCALC 62 10/04/2020 0854   LDLDIRECT 131.0 01/08/2020 1342    Other: (TSH, CBC, Vit D, etc.)  Clinical ASCVD: Yes  The ASCVD Risk score Mikey Bussing DC Jr., et al., 2013) failed to calculate for the following reasons:   The patient has a prior MI or stroke diagnosis    Other: (CHADS2VASc if Afib, PHQ9 if depression, MMRC or CAT for COPD, ACT, DEXA)  BP Readings from Last 3 Encounters:  03/04/21 (!) 160/84  02/18/21 (!) 163/93  01/14/21 (!) 144/88    Care Plan  No Known Allergies  Medications Reviewed Today     Reviewed by Cherre Robins, PharmD (Pharmacist) on 03/08/21 at 1716  Med List Status: <None>   Medication Order Taking? Sig Documenting Provider Last Dose Status Informant  amLODipine (NORVASC) 10 MG tablet [id number] Yes TAKE 1 TABLET(10 MG) BY MOUTH DAILY Jettie Booze, MD Taking Active   aspirin EC 81 MG EC tablet [id number] Yes Take 1 tablet (81 mg total) by mouth daily with breakfast. Roxan Hockey, MD Taking Active   blood glucose meter kit and supplies [id number] Yes Relion Prime or Dispense other brand based on patient and insurance preference. Use up to four times daily as directed. (FOR ICD-9 250.00, 250.01). Roxan Hockey, MD Taking Active   blood glucose meter kit and supplies KIT [id number] Yes Dispense based on patient and insurance preference. Use up to four times daily as directed. (FOR ICD-9 250.00, 250.01). Ann Held, DO Taking Active Self  empagliflozin (JARDIANCE) 10 MG TABS tablet [id number] No Take 1 tablet (10 mg total) by mouth daily before breakfast.  Patient not taking: Reported on 03/08/2021   Ann Held, DO Not Taking Active   ezetimibe (ZETIA) 10 MG tablet [id number] Yes Take 1 tablet (10 mg total) by mouth daily. Jettie Booze, MD Taking Active   insulin isophane & regular human (NOVOLIN 70/30 FLEXPEN) (70-30) 100 UNIT/ML KwikPen  [id number] Yes Inject 22 Units into the skin in the morning and at bedtime. Carollee Herter, Kendrick Fries R, DO Taking Active   lisinopril (ZESTRIL) 40 MG tablet [id number] Yes TAKE 1 TABLET(40 MG) BY MOUTH DAILY Jettie Booze, MD Taking Active   metFORMIN (GLUCOPHAGE) 500 MG tablet [id number] Yes TAKE 2 TABLETS(1000 MG) BY MOUTH TWICE DAILY WITH A MEAL Roma Schanz R, DO Taking Active   metoprolol tartrate (LOPRESSOR) 100 MG tablet [id number] Yes TAKE 1 TABLET BY MOUTH IN THE MORNING, AT NOON AND AT BEDTIME Ann Held, DO Taking Active   rosuvastatin (CRESTOR) 20 MG tablet [id number] Yes Take 0.5 tablets (10 mg total) by mouth daily. Jettie Booze, MD Taking Active   ticagrelor (BRILINTA) 60 MG TABS tablet [id number] Yes Take 1 tablet (60 mg total) by mouth 2 (two) times daily. Jettie Booze, MD Taking Active             Patient Active Problem List   Diagnosis Date Noted   Pain in left wrist 02/20/2021   Pain in right wrist 02/20/2021   Pain in joint, multiple sites 01/14/2021   Hyperlipidemia associated with type 2 diabetes mellitus ([place]) 01/14/2021   Uncontrolled type 2 diabetes mellitus with hyperglycemia ([place]) 01/14/2021   CAD in native artery/Stent 09/16/2018 09/16/2018   DKA, type 2 ([place]) 09/13/2018   NSTEMI (non-ST elevated myocardial infarction) ([place]) 09/13/2018   DKA (diabetic ketoacidoses) 09/13/2018   Elevated troponin    DM (diabetes mellitus) type II uncontrolled, periph vascular disorder ([place]) 08/06/2017   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   GENERALIZED ANXIETY DISORDER 10/13/2010   CARDIAC MURMUR 10/13/2010   CHEST PAIN, HX OF 02/28/2008   ELBOW PAIN, RIGHT 11/01/2007   Hyperlipidemia LDL goal <100 10/14/2007   HYPERGLYCEMIA 10/14/2007   SICKLE CELL TRAIT 01/24/2007   Essential hypertension 01/24/2007   POSTPROCEDURAL STATUS NEC 01/24/2007    Conditions to be addressed/monitored: HTN, HLD, Hypertriglyceridemia, and  DMII  Care Plan : General Pharmacy (Adult)  Updates made by Cherre Robins, PHARMD since 03/08/2021 12:00 AM     Problem: type 2 DM; hyperlipidemia; HTN   Priority: High  Onset Date: 03/08/2021  Note:   Current Barriers:  Unable to independently afford treatment regimen Unable to achieve control of type 2 DM   Pharmacist Clinical Goal(s):  Over the next 90 days, patient will verbalize ability to afford treatment regimen achieve control of diabetes as evidenced by A1c < 7.0%  through collaboration with PharmD and provider.   Interventions: 1:1 collaboration with Carollee Herter, Alferd Apa, DO regarding development and update of comprehensive plan of care as evidenced by provider attestation and co-signature Inter-disciplinary care team collaboration (see longitudinal plan of care) Comprehensive medication review performed; medication list updated in electronic medical record  Diabetes: Uncontrolled; A1c goal < 7.0%  Current treatment:  Novoloin 70/30 Flex Pens - inject 22 units twice a day Metformin 538m - take 2 tablets twice a day Jardiance 197mdaily (hasn't started due to cost) Current glucose readings: checking about once  daily - usually 150 to 250 Patient asked about CGM DexCom Interventions:  Checked with her insurance Friday Health Plan regarding DexCom DexCom is tier 3 but requires that patient is no short acting insulin Also patient has $2300 deductible which means monthly cost could be about $150 to 200 for sensors until she meets deductible and she states she cannot afford this. She will continue with finger sticks for now.  Coordinated with PCP to change Jardiance to Farxiga 13m daily since patient already is approved to get Brilenta from [employer] Warner[employer] and FWilder Gladeis also an AFirefighterproduct.  Started process for NEastman ChemicalPAP to get insulin for patient (will likely saver her about $25 to 50 per month) Reviewed home blood glucose readings and reviewed goals  Fasting  blood glucose goal (before meals) = 80 to 130 Blood glucose goal after a meal = less than 180   Hypertension: Uncontrolled; BP goal <140/90 Current treatment: Amlodipine 158mdaily Lisinopril 4019maily  Metoprolol 100m30mtimes a day Denies/reports hypotensive/hypertensive symptoms Interventions:  Educated about BP goals Discussed importance of BP control to prevent stroke and kidney disease.   Hyperlipidemia: Uncontrolled; LDL goal <100 Current treatment: Rosuvastatin 20mg36make 0.5 tablet (=10mg)86mly Ezetmibe 10mg d27m  Lipid Panel     Component Value Date/Time   CHOL 198 03/04/2021 1337   CHOL 124 10/04/2020 0854   TRIG 166.0 (H) 03/04/2021 1337   HDL 40.80 03/04/2021 1337   HDL 42 10/04/2020 0854   CHOLHDL 5 03/04/2021 1337   VLDL 33.2 03/04/2021 1337   LDLCALC 124 (H) 03/04/2021 1337   LDLCALC 62 10/04/2020 0854   LDLDIRECT 131.0 01/08/2020 1342   LABVLDL 20 10/04/2020 0854   Interventions:  Discussed LDL and Tg goals Education provided about importance of statin therapy in prevention of heart disease  Patient Goals/Self-Care Activities Over the next 90 days, patient will:  take medications as prescribed, check glucose 2 times a day, document, and provide at future appointments, and collaborate with provider on medication access solutions  Follow Up Plan: Telephone follow up appointment with care management team member scheduled for:  2 weeks        Medication Assistance:  Application for Novo [place] / insuiln  medication assistance program. in process.  Will also add Farxiga to [place] and [facility] program which patient has already been approved. Anticipated assistance start date 03/26/2021.  See plan of care for additional detail.  Follow Up:  Patient agrees to Care Plan and Follow-up.  Plan: Telephone follow up appointment with care management team member scheduled for:  2 weeks  Ladene Allocca ECherre RobinsD Clinical Pharmacist [facility]

## 2021-03-08 NOTE — Patient Instructions (Signed)
Visit Information   PATIENT GOALS:   Goals Addressed             This Visit's Progress    Pharmacy Care Plan - Care Coordination       Current Barriers:  Unable to independently afford treatment regimen Unable to achieve control of type 2 diabetes  Interventions: 1:1 collaboration with Carollee Herter, Alferd Apa, DO regarding development and update of comprehensive plan of care as evidenced by provider attestation and co-signature Inter-disciplinary care team collaboration (see longitudinal plan of care) Comprehensive medication review performed; medication list updated in electronic medical record  Diabetes: Uncontrolled; A1c goal < 7.0%   Lab Results  Component Value Date   HGBA1C 8.2 (H) 03/04/2021  Current treatment:  Novoloin 70/30 Flex Pen - inject 22 units twice a day Metformin 594m - take 2 tablets twice a day Jardiance 165mdaily (hasn't started due to cost) Interventions:  Checked with insurance Friday Health Plan regarding DexCom DexCom is tier 3 but requires that patient is on short acting insulin Also has $2300 deductible which means monthly cost could be about $150 to 200 for sensors until deductible is met. DexCom not a good option due to cost. Coordinated with Dr LoCarollee Hertero change Jardiance to FaIranm58maily since patient already is approved to get [employer] assistance program and FarWilder Glade also an AstField seismologistStart Farxiga 5mg63mily. Starting process for NovoEastman Chemicalient assistance to get insulin for patient (will likely save about $25 to 50 per month). Complete application and return to TammCherre Robinsinical pharmacist at [facility] [place] blood glucose readings and reviewed goals  Fasting blood glucose goal (before meals) = 80 to 130 Blood glucose goal after a meal = less than 180   Hypertension: Uncontrolled; BP goal <140/90  BP Readings from Last 3  Encounters:  03/04/21 (!) 160/84  02/18/21 (!) 163/93  01/14/21 (!) 144/88   Current treatment: Amlodipine 10mg56mly Lisinopril 40mg 54my  Metoprolol 100mg 341mes a day Interventions:  Educated about blood pressure goals Blood pressure control is very important to  prevent stroke and kidney disease.   Hyperlipidemia: Uncontrolled; LDL goal <100 and triglyceride goal is <150 Current treatment: Rosuvastatin 20mg - 60m 0.5 tablet (=10mg) da81mEzetmibe 10mg dail68mipid Panel     Component Value Date/Time   CHOL 198 03/04/2021 1337   CHOL 124 10/04/2020 0854   TRIG 166.0 (H) 03/04/2021 1337   HDL 40.80 03/04/2021 1337   HDL 42 10/04/2020 0854   CHOLHDL 5 03/04/2021 1337   VLDL 33.2 03/04/2021 1337   LDLCALC 124 (H) 03/04/2021 1337   LDLCALC 62 10/04/2020 0854   LDLDIRECT 131.0 01/08/2020 1342   LABVLDL 20 10/04/2020 0854   Interventions:  Discussed LDL and triglyceride goals Important to continue to take rosuvastatin and meet cholesterol goal to prevention of heart disease, stroke and heart attack.   Patient Goals/Self-Care Activities Over the next 90 days, patient will:  take medications as prescribed check glucose 2 times a day, document, and provide at future appointments collaborate with provider on medication access solutions  Follow Up Plan: Telephone follow up appointment with care management team member scheduled for:  2 weeks         Consent to CCM Services: Danielle Flores wasGlatfelter information about Chronic Care Management services including:  CCM service includes personalized support from designated clinical staff supervised by her physician, including  individualized plan of care and coordination with other care providers 24/7 contact phone numbers for assistance for urgent and routine care needs. Service will only be billed when office clinical staff spend 20 minutes or more in a month to coordinate care. Only one practitioner may furnish and bill the  service in a calendar month. The patient may stop CCM services at any time (effective at the end of the month) by phone call to the office staff. The patient will be responsible for cost sharing (co-pay) of up to 20% of the service fee (after annual deductible is met).  Patient agreed to services and verbal consent obtained.   Patient verbalizes understanding of instructions provided today and agrees to view in [place].   Telephone follow up appointment with care management team member scheduled for: 2 weeks  Danielle Flores, Danielle Flores Clinical Pharmacist [facility]

## 2021-03-08 NOTE — Telephone Encounter (Signed)
Checked with patient's insurance - Friday health Plan. They only cover DexCom CGM if patient is taking short acting insulin (Regular, R, Novolog, etc) she would have to change from 70/30 to long acting + short acting before meals.  DexCom is also a tier 3 medication which means patient would have to meet $2300 deductible. I spoke with patient and she is unable to afford deductible.  Therefore due to cost, DexCom is not a good option for her at this time.  When I reviewed patient's med, I noticed she is getting Brilenta thru the [employer] patient assistance program. Patient states she was waiting to hear about if she was to start Jardiance or not. She might be able to add [place] instead and also get through [employer] - would only need to send in new Rx to program. I can also check into getting Novolin through [employer] for her (she is currently paying cash price of $25 per vial at [employer]). Patient would like to use pens if available.

## 2021-03-10 ENCOUNTER — Encounter: Payer: Self-pay | Admitting: Family Medicine

## 2021-03-11 ENCOUNTER — Ambulatory Visit: Payer: 59 | Admitting: Pharmacist

## 2021-03-11 DIAGNOSIS — E1165 Type 2 diabetes mellitus with hyperglycemia: Secondary | ICD-10-CM

## 2021-03-11 DIAGNOSIS — I2510 Atherosclerotic heart disease of native coronary artery without angina pectoris: Secondary | ICD-10-CM

## 2021-03-11 LAB — LUPUS ANTICOAGULANT PANEL
Dilute Viper Venom Time: 51.7 s — ABNORMAL HIGH (ref 0.0–47.0)
PTT Lupus Anticoagulant: 34.3 s (ref 0.0–51.9)

## 2021-03-11 LAB — DRVVT MIX: dRVVT Mix: 43 s — ABNORMAL HIGH (ref 0.0–40.4)

## 2021-03-11 LAB — DRVVT CONFIRM: dRVVT Confirm: 1.3 ratio — ABNORMAL HIGH (ref 0.8–1.2)

## 2021-03-11 NOTE — Patient Instructions (Signed)
Visit Information  PATIENT GOALS:  Goals Addressed             This Visit's Progress    Pharmacy Care Plan - Care Coordination       Current Barriers:  Unable to independently afford treatment regimen Unable to achieve control of type 2 diabetes  Interventions: 1:1 collaboration with Carollee Herter, Alferd Apa, DO regarding development and update of comprehensive plan of care as evidenced by provider attestation and co-signature Inter-disciplinary care team collaboration (see longitudinal plan of care) Comprehensive medication review performed; medication list updated in electronic medical record  Diabetes: Uncontrolled; A1c goal < 7.0%   Lab Results  Component Value Date   HGBA1C 8.2 (H) 03/04/2021  Current treatment:  Novoloin 70/30 Flex Pen - inject 22 units twice a day Metformin 556m - take 2 tablets twice a day Jardiance 171mdaily (hasn't started due to cost) Interventions:  Checked with insurance Friday Health Plan regarding DexCom DexCom is tier 3 but requires that patient is on short acting insulin Also has $2300 deductible which means monthly cost could be about $150 to 200 for sensors until deductible is met. DexCom not a good option due to cost. Start Farxiga 59m62maily. [facility] with coupon card. Please call me if you have any issues.  Starting process for NovEastman Chemicaltient assistance to get insulin for patient (will likely save about $25 to 50 per month). Complete application and return to TamCherre Robinslinical pharmacist [facility] blood glucose readings and reviewed goals  Fasting blood glucose goal (before meals) = 80 to 130 Blood glucose goal after a meal = less than 180   Hypertension: Uncontrolled; BP goal <140/90  BP Readings from Last 3 Encounters:  03/04/21 (!) 160/84  02/18/21 (!) 163/93  01/14/21 (!) 144/88   Current  treatment: Amlodipine 28m46mily Lisinopril 40mg21mly  Metoprolol 100mg 46mmes a day Interventions:  Educated about blood pressure goals Blood pressure control is very important to  prevent stroke and kidney disease.   Hyperlipidemia: Uncontrolled; LDL goal <100 and triglyceride goal is <150 Current treatment: Rosuvastatin 20mg -67me 0.5 tablet (=28mg) d46m Ezetmibe 28mg dai259mLipid Panel     Component Value Date/Time   CHOL 198 03/04/2021 1337   CHOL 124 10/04/2020 0854   TRIG 166.0 (H) 03/04/2021 1337   HDL 40.80 03/04/2021 1337   HDL 42 10/04/2020 0854   CHOLHDL 5 03/04/2021 1337   VLDL 33.2 03/04/2021 1337   LDLCALC 124 (H) 03/04/2021 1337   LDLCALC 62 10/04/2020 0854   LDLDIRECT 131.0 01/08/2020 1342   LABVLDL 20 10/04/2020 0854   Interventions:  Discussed LDL and triglyceride goals Important to continue to take rosuvastatin and meet cholesterol goal to prevention of heart disease, stroke and heart attack.   Patient Goals/Self-Care Activities Over the next 90 days, patient will:  take medications as prescribed check glucose 2 times a day, document, and provide at future appointments collaborate with provider on medication access solutions  Follow Up Plan: Telephone follow up appointment with care management team member scheduled for:  2 weeks         The patient verbalized understanding of instructions, educational materials, and care plan provided today and declined offer to receive copy of patient instructions, educational materials, and care plan.   Telephone follow up appointment with care management team member scheduled for: 2 weeks  Cherre Robins, PharmD Clinical Pharmacist [facility]

## 2021-03-11 NOTE — Chronic Care Management (AMB) (Signed)
Care Management   Pharmacy Note  03/11/2021 Name: Danielle Flores MRN: [id number] DOB: Dec 09, 1965  Subjective: Danielle Flores is a 55 y.o. year old female who is a primary care patient of Ann Held, DO. The Care Management team was consulted for assistance with care management and care coordination needs.    Coordinated with PCP, pharmacy and patient to start [place]  for  medication access assistance  in response to provider referral for pharmacy case management and/or care coordination services.   The patient was given information about Care Management services today including:  Care Management services includes personalized support from designated clinical staff supervised by the patient's primary care provider, including individualized plan of care and coordination with other care providers. 24/7 contact phone numbers for assistance for urgent and routine care needs. The patient may stop case management services at any time by phone call to the office staff.  Patient agreed to services and consent obtained.  Assessment:  Review of patient status, including review of consultants reports, laboratory and other test data, was performed as part of comprehensive evaluation and provision of chronic care management services.   SDOH (Social Determinants of Health) assessments and interventions performed:    Objective:  Lab Results  Component Value Date   CREATININE 0.90 03/04/2021   CREATININE 1.03 01/08/2020   CREATININE 0.86 10/21/2019    Lab Results  Component Value Date   HGBA1C 8.2 (H) 03/04/2021       Component Value Date/Time   CHOL 198 03/04/2021 1337   CHOL 124 10/04/2020 0854   TRIG 166.0 (H) 03/04/2021 1337   HDL 40.80 03/04/2021 1337   HDL 42 10/04/2020 0854   CHOLHDL 5 03/04/2021 1337   VLDL 33.2 03/04/2021 1337   LDLCALC 124 (H) 03/04/2021 1337   LDLCALC 62 10/04/2020 0854   LDLDIRECT 131.0 01/08/2020 1342    Other: (TSH, CBC, Vit D,  etc.)  Clinical ASCVD: Yes  The ASCVD Risk score Mikey Bussing DC Jr., et al., 2013) failed to calculate for the following reasons:   The patient has a prior MI or stroke diagnosis     BP Readings from Last 3 Encounters:  03/04/21 (!) 160/84  02/18/21 (!) 163/93  01/14/21 (!) 144/88    Care Plan  No Known Allergies  Medications Reviewed Today     Reviewed by Cherre Robins, PharmD (Pharmacist) on 03/11/21 at 1330  Med List Status: <None>   Medication Order Taking? Sig Documenting Provider Last Dose Status Informant  amLODipine (NORVASC) 10 MG tablet [id number] Yes TAKE 1 TABLET(10 MG) BY MOUTH DAILY Jettie Booze, MD Taking Active   aspirin EC 81 MG EC tablet [id number] Yes Take 1 tablet (81 mg total) by mouth daily with breakfast. Roxan Hockey, MD Taking Active   blood glucose meter kit and supplies [id number] Yes Relion Prime or Dispense other brand based on patient and insurance preference. Use up to four times daily as directed. (FOR ICD-9 250.00, 250.01). Roxan Hockey, MD Taking Active   blood glucose meter kit and supplies KIT [id number] Yes Dispense based on patient and insurance preference. Use up to four times daily as directed. (FOR ICD-9 250.00, 250.01). Ann Held, DO Taking Active Self  dapagliflozin propanediol (FARXIGA) 5 MG TABS tablet [id number] Yes Take 5 mg by mouth daily. [provider]  Active            Med Note Antony Contras, Rhyland Hinderliter B   Fri Mar 11, 2021  1:29 PM) 30  day free trial card [facility]; [place]  ezetimibe (ZETIA) 10 MG tablet [id number] Yes Take 1 tablet (10 mg total) by mouth daily. Jettie Booze, MD Taking Active   insulin isophane & regular human (NOVOLIN 70/30 FLEXPEN) (70-30) 100 UNIT/ML KwikPen [id number] Yes Inject 22 Units into the skin in the morning and at bedtime. Carollee Herter, Kendrick Fries R, DO Taking Active   lisinopril (ZESTRIL) 40 MG tablet [id number] Yes TAKE 1 TABLET(40 MG) BY MOUTH DAILY Jettie Booze, MD Taking Active   metFORMIN (GLUCOPHAGE) 500 MG tablet [id number] Yes TAKE 2 TABLETS(1000 MG) BY MOUTH TWICE DAILY WITH A MEAL Roma Schanz R, DO Taking Active   metoprolol tartrate (LOPRESSOR) 100 MG tablet [id number] Yes TAKE 1 TABLET BY MOUTH IN THE MORNING, AT NOON AND AT BEDTIME Ann Held, DO Taking Active   rosuvastatin (CRESTOR) 20 MG tablet [id number] Yes Take 0.5 tablets (10 mg total) by mouth daily. Jettie Booze, MD Taking Active   ticagrelor (BRILINTA) 60 MG TABS tablet [id number] Yes Take 1 tablet (60 mg total) by mouth 2 (two) times daily. Jettie Booze, MD Taking Active             Patient Active Problem List   Diagnosis Date Noted   Pain in left wrist 02/20/2021   Pain in right wrist 02/20/2021   Pain in joint, multiple sites 01/14/2021   Hyperlipidemia associated with type 2 diabetes mellitus ([place]) 01/14/2021   Uncontrolled type 2 diabetes mellitus with hyperglycemia ([place]) 01/14/2021   CAD in native artery/Stent 09/16/2018 09/16/2018   DKA, type 2 ([place]) 09/13/2018   NSTEMI (non-ST elevated myocardial infarction) ([place]) 09/13/2018   DKA (diabetic ketoacidoses) 09/13/2018   Elevated troponin    DM (diabetes mellitus) type II uncontrolled, periph vascular disorder ([place]) 08/06/2017   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   GENERALIZED ANXIETY DISORDER 10/13/2010   CARDIAC MURMUR 10/13/2010   CHEST PAIN, HX OF 02/28/2008   ELBOW PAIN, RIGHT 11/01/2007   Hyperlipidemia LDL goal <100 10/14/2007   HYPERGLYCEMIA 10/14/2007   SICKLE CELL TRAIT 01/24/2007   Essential hypertension 01/24/2007   POSTPROCEDURAL STATUS NEC 01/24/2007    Conditions to be addressed/monitored: HTN, HLD, and DMII  Care Plan : General Pharmacy (Adult)  Updates made by Cherre Robins, PHARMD since 03/11/2021 12:00 AM     Problem: type 2 DM; hyperlipidemia; HTN   Priority: High  Onset Date: 03/08/2021  Note:   Current Barriers:  Unable to  independently afford treatment regimen Unable to achieve control of type 2 DM   Pharmacist Clinical Goal(s):  Over the next 90 days, patient will verbalize ability to afford treatment regimen achieve control of diabetes as evidenced by A1c < 7.0%  through collaboration with PharmD and provider.   Interventions: 1:1 collaboration with Carollee Herter, Alferd Apa, DO regarding development and update of comprehensive plan of care as evidenced by provider attestation and co-signature Inter-disciplinary care team collaboration (see longitudinal plan of care) Comprehensive medication review performed; medication list updated in electronic medical record  Diabetes: Uncontrolled; A1c goal < 7.0%  Current treatment:  Novoloin 70/30 Flex Pens - inject 22 units twice a day Metformin 593m - take 2 tablets twice a day Jardiance 136mdaily (hasn't started due to cost) Current glucose readings: checking about once daily - usually 150 to 250 Patient asked about CGM DexCom Interventions:  Checked with her insurance Friday Health Plan regarding [facility] tier  3 but requires that patient is no short acting insulin Also patient has $2300 deductible which means monthly cost could be about $150 to 200 for sensors until she meets deductible and she states she cannot afford this. She will continue with finger sticks for now.  Coordinated with PCP to change Jardiance to Farxiga 77m daily since patient already is approved to get Brilenta from [employer]AP and FWilder Gladeis also an AFirefighterproduct.  Coordinated with [facility] - Rx for Farxiga [id number] #[id number] called in and gave coupon card for 30 days free until we hear from PAP program.  Started process for Novo Nordisk PAP to get insulin for patient (will likely saver her about $25 to 50 per month) - application mailed to patient to complete. Reviewed home blood glucose readings and reviewed goals  Fasting blood glucose goal (before meals) = 80 to 130 Blood  glucose goal after a meal = less than 180   Hypertension: Uncontrolled; BP goal <140/90 Current treatment: Amlodipine 1096maily Lisinopril 58m60mily  Metoprolol 100mg74mimes a day Denies/reports hypotensive/hypertensive symptoms Interventions:  Educated about BP goals Discussed importance of BP control to prevent stroke and kidney disease.   Hyperlipidemia: Uncontrolled; LDL goal <100 Current treatment: Rosuvastatin 20mg 42mke 0.5 tablet (=10mg) 19my Ezetmibe 10mg da44m Lipid Panel     Component Value Date/Time   CHOL 198 03/04/2021 1337   CHOL 124 10/04/2020 0854   TRIG 166.0 (H) 03/04/2021 1337   HDL 40.80 03/04/2021 1337   HDL 42 10/04/2020 0854   CHOLHDL 5 03/04/2021 1337   VLDL 33.2 03/04/2021 1337   LDLCALC 124 (H) 03/04/2021 1337   LDLCALC 62 10/04/2020 0854   LDLDIRECT 131.0 01/08/2020 1342   LABVLDL 20 10/04/2020 0854   Interventions:  Discussed LDL and Tg goals Education provided about importance of statin therapy in prevention of heart disease  Patient Goals/Self-Care Activities Over the next 90 days, patient will:  take medications as prescribed, check glucose 2 times a day, document, and provide at future appointments, and collaborate with provider on medication access solutions  Follow Up Plan: Telephone follow up appointment with care management team member scheduled for:  2 weeks        Medication Assistance:   Completed and faxed [place] and [employer] for Farxiga Bulpitta35mnt has already been [place] for [place] and Me[place] receive Brilenta. Called [facility] and provided Rx for Farxiga and coupon card for 30 days free so patient start now. [facility]    Follow Up:  Patient agrees to Care Plan and Follow-up.  Plan: Telephone follow up appointment with care management team member scheduled for:  2 weeks  Latorsha Curling EckCherre RobinsClinical Pharmacist [place] [facility]

## 2021-03-22 ENCOUNTER — Ambulatory Visit: Payer: 59 | Admitting: Pharmacist

## 2021-03-22 DIAGNOSIS — E1165 Type 2 diabetes mellitus with hyperglycemia: Secondary | ICD-10-CM

## 2021-03-22 DIAGNOSIS — Z79899 Other long term (current) drug therapy: Secondary | ICD-10-CM

## 2021-03-22 NOTE — Patient Instructions (Signed)
Visit Information  PATIENT GOALS:  Goals Addressed             This Visit's Progress    Pharmacy Care Plan - Care Coordination   On track    Current Barriers:  Unable to independently afford treatment regimen Unable to achieve control of type 2 diabetes  Interventions: 1:1 collaboration with Zola Button, Grayling Congress, DO regarding development and update of comprehensive plan of care as evidenced by provider attestation and co-signature Inter-disciplinary care team collaboration (see longitudinal plan of care) Comprehensive medication review performed; medication list updated in electronic medical record  Diabetes: Uncontrolled; A1c goal < 7.0%   Lab Results  Component Value Date   HGBA1C 8.2 (H) 03/04/2021  Current treatment:  Novoloin 70/30 Flex Pen - inject 22 units twice a day Metformin 500mg  - take 2 tablets twice a day Farxiga 5mg  daily Interventions:  Verified she was approved for 5mg  daily from PAP. Starting process for patient assistance to get insulin for patient (will likely save about $25 to 50 per month). Reviewed home blood glucose readings and reviewed goals  Fasting blood glucose goal (before meals) = 80 to 130 Blood glucose goal after a meal = less than 180   Hypertension: Uncontrolled; BP goal <140/90  BP Readings from Last 3 Encounters:  03/04/21 (!) 160/84  02/18/21 (!) 163/93  01/14/21 (!) 144/88   Current treatment: Amlodipine 10mg  daily Lisinopril 40mg  daily  Metoprolol 100mg  3 times a day Interventions:  Educated about blood pressure goals Blood pressure control is very important to  prevent stroke and kidney disease.   Hyperlipidemia: Uncontrolled; LDL goal <100 and triglyceride goal is <150 Current treatment: Rosuvastatin 20mg  - take 0.5 tablet (=10mg ) daily Ezetmibe 10mg  daily  Lipid Panel     Component Value Date/Time   CHOL 198 03/04/2021 1337   CHOL 124 10/04/2020 0854   TRIG 166.0 (H) 03/04/2021 1337   HDL  40.80 03/04/2021 1337   HDL 42 10/04/2020 0854   CHOLHDL 5 03/04/2021 1337   VLDL 33.2 03/04/2021 1337   LDLCALC 124 (H) 03/04/2021 1337   LDLCALC 62 10/04/2020 0854   LDLDIRECT 131.0 01/08/2020 1342   LABVLDL 20 10/04/2020 0854   Interventions:  Discussed LDL and triglyceride goals Important to continue to take rosuvastatin and meet cholesterol goal to prevention of heart disease, stroke and heart attack.   Patient Goals/Self-Care Activities Over the next 90 days, patient will:  take medications as prescribed check glucose 2 times a day, document, and provide at future appointments collaborate with provider on medication access solutions  Follow Up Plan: Telephone follow up appointment with care management team member scheduled for:  2 to 4 weeks         The patient verbalized understanding of instructions, educational materials, and care plan provided today and declined offer to receive copy of patient instructions, educational materials, and care plan.   Telephone follow up appointment with care management team member scheduled for: 2 to 4 weeks  03/06/2021, PharmD Clinical Pharmacist [facility] Primary Care [facility]

## 2021-03-22 NOTE — Chronic Care Management (AMB) (Signed)
Care Management   Pharmacy Note  03/22/2021 Name: Danielle Flores MRN: [id number] DOB: Sep 09, 1965  Subjective: Danielle Flores is a 55 y.o. year old female who is a primary care patient of Ann Held, DO. The Care Management team was consulted for assistance with care management and care coordination needs.    Coordinated with PCP, pharmacy and patient for Farxiga and Novolin 70/30 or Novolog 70/30 Mix  for  medication access assistance  in response to provider referral for pharmacy case management and/or care coordination services.   The patient was given information about Care Management services today including:  Care Management services includes personalized support from designated clinical staff supervised by the patient's primary care provider, including individualized plan of care and coordination with other care providers. 24/7 contact phone numbers for assistance for urgent and routine care needs. The patient may stop case management services at any time by phone call to the office staff.  Patient agreed to services and consent obtained.  Assessment:  Review of patient status, including review of consultants reports, laboratory and other test data, was performed as part of comprehensive evaluation and provision of chronic care management services.   SDOH (Social Determinants of Health) assessments and interventions performed:    Objective:  Lab Results  Component Value Date   CREATININE 0.90 03/04/2021   CREATININE 1.03 01/08/2020   CREATININE 0.86 10/21/2019    Lab Results  Component Value Date   HGBA1C 8.2 (H) 03/04/2021       Component Value Date/Time   CHOL 198 03/04/2021 1337   CHOL 124 10/04/2020 0854   TRIG 166.0 (H) 03/04/2021 1337   HDL 40.80 03/04/2021 1337   HDL 42 10/04/2020 0854   CHOLHDL 5 03/04/2021 1337   VLDL 33.2 03/04/2021 1337   LDLCALC 124 (H) 03/04/2021 1337   LDLCALC 62 10/04/2020 0854   LDLDIRECT 131.0 01/08/2020 1342     Other: (TSH, CBC, Vit D, etc.)  Clinical ASCVD: Yes  The ASCVD Risk score Mikey Bussing DC Jr., et al., 2013) failed to calculate for the following reasons:   The patient has a prior MI or stroke diagnosis     BP Readings from Last 3 Encounters:  03/04/21 (!) 160/84  02/18/21 (!) 163/93  01/14/21 (!) 144/88    Care Plan  No Known Allergies  Medications Reviewed Today     Reviewed by Cherre Robins, PharmD (Pharmacist) on 03/22/21 at 1539  Med List Status: <None>   Medication Order Taking? Sig Documenting Provider Last Dose Status Informant  amLODipine (NORVASC) 10 MG tablet [id number] Yes TAKE 1 TABLET(10 MG) BY MOUTH DAILY Jettie Booze, MD Taking Active   aspirin EC 81 MG EC tablet [id number] Yes Take 1 tablet (81 mg total) by mouth daily with breakfast. Roxan Hockey, MD Taking Active   blood glucose meter kit and supplies [id number] Yes Relion Prime or Dispense other brand based on patient and insurance preference. Use up to four times daily as directed. (FOR ICD-9 250.00, 250.01). Roxan Hockey, MD Taking Active   blood glucose meter kit and supplies KIT [id number] Yes Dispense based on patient and insurance preference. Use up to four times daily as directed. (FOR ICD-9 250.00, 250.01). Ann Held, DO Taking Active Self  dapagliflozin propanediol (FARXIGA) 5 MG TABS tablet [id number] Yes Take 5 mg by mouth daily. [provider] Taking Active            Med Note Antony Contras, [place] B   Tue Mar 22, 2021  3:39 PM) 03/22/2021 - approved thru 08/06/2021 [place]  ezetimibe (ZETIA) 10 MG tablet [id number] Yes Take 1 tablet (10 mg total) by mouth daily. Jettie Booze, MD Taking Active   insulin isophane & regular human (NOVOLIN 70/30 FLEXPEN) (70-30) 100 UNIT/ML KwikPen [id number] Yes Inject 22 Units into the skin in the morning and at bedtime. Carollee Herter, Kendrick Fries R, DO Taking Active   lisinopril (ZESTRIL) 40 MG tablet [id number] Yes TAKE 1 TABLET(40 MG)  BY MOUTH DAILY Jettie Booze, MD Taking Active   metFORMIN (GLUCOPHAGE) 500 MG tablet [id number] Yes TAKE 2 TABLETS(1000 MG) BY MOUTH TWICE DAILY WITH A MEAL Roma Schanz R, DO Taking Active   metoprolol tartrate (LOPRESSOR) 100 MG tablet [id number] Yes TAKE 1 TABLET BY MOUTH IN THE MORNING, AT NOON AND AT BEDTIME Ann Held, DO Taking Active   rosuvastatin (CRESTOR) 20 MG tablet [id number] Yes Take 0.5 tablets (10 mg total) by mouth daily. Jettie Booze, MD Taking Active   ticagrelor (BRILINTA) 60 MG TABS tablet [id number] Yes Take 1 tablet (60 mg total) by mouth 2 (two) times daily. Jettie Booze, MD Taking Active            Med Note Theodosia Blender Mar 22, 2021  3:39 PM) Thru 08/06/2021 - [place]            Patient Active Problem List   Diagnosis Date Noted   Pain in left wrist 02/20/2021   Pain in right wrist 02/20/2021   Pain in joint, multiple sites 01/14/2021   Hyperlipidemia associated with type 2 diabetes mellitus ([place]) 01/14/2021   Uncontrolled type 2 diabetes mellitus with hyperglycemia ([place]) 01/14/2021   CAD in native artery/Stent 09/16/2018 09/16/2018   DKA, type 2 ([place]) 09/13/2018   NSTEMI (non-ST elevated myocardial infarction) ([place]) 09/13/2018   DKA (diabetic ketoacidoses) 09/13/2018   Elevated troponin    DM (diabetes mellitus) type II uncontrolled, periph vascular disorder ([place]) 08/06/2017   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   GENERALIZED ANXIETY DISORDER 10/13/2010   CARDIAC MURMUR 10/13/2010   CHEST PAIN, HX OF 02/28/2008   ELBOW PAIN, RIGHT 11/01/2007   Hyperlipidemia LDL goal <100 10/14/2007   HYPERGLYCEMIA 10/14/2007   SICKLE CELL TRAIT 01/24/2007   Essential hypertension 01/24/2007   POSTPROCEDURAL STATUS NEC 01/24/2007    Conditions to be addressed/monitored: HTN, HLD, and DMII  Care Plan : General Pharmacy (Adult)  Updates made by Cherre Robins, PHARMD since 03/22/2021 12:00 AM      Problem: type 2 DM; hyperlipidemia; HTN   Priority: High  Onset Date: 03/08/2021  Note:   Current Barriers:  Unable to independently afford treatment regimen Unable to achieve control of type 2 DM   Pharmacist Clinical Goal(s):  Over the next 90 days, patient will verbalize ability to afford treatment regimen achieve control of diabetes as evidenced by A1c < 7.0%  through collaboration with PharmD and provider.   Interventions: 1:1 collaboration with Carollee Herter, Alferd Apa, DO regarding development and update of comprehensive plan of care as evidenced by provider attestation and co-signature Inter-disciplinary care team collaboration (see longitudinal plan of care) Comprehensive medication review performed; medication list updated in electronic medical record  Diabetes: Uncontrolled; A1c goal < 7.0%   Lab Results  Component Value Date   HGBA1C 8.2 (H) 03/04/2021   Current treatment:  Novoloin 70/30 Flex Pens - inject 22 units twice a day  Metformin 561m - take 2 tablets twice a day Farxiga 54mdaily - started 03/2021 Current glucose readings: patient states she has not checked BG since she started FaIranecause she was giving the new medication time to work.  She checked BG while on the phone and BG was 150 (ate stringed cheese about 20 minutes prior)  At last appt checked on coverage of CGM DexCom for patient Patient asked about CGM DexCom. DexCom is tier 3 but requires that patient is no short acting insulin. Also patient has $2300 deductible which means monthly cost could be about $150 to 200 for sensors until she meets deductible and she states she cannot afford this. She will continue with finger sticks for now.  Interventions:  Verified that patient has been approved thru 08/06/2021 to received [facility]iga [facility].    Completed Novo Nordisk PAP to get insulin for patient (will likely save her about $25 to 50 per month). Forwarded to PCP for review and signature.   Reviewed home blood glucose readings and reviewed goals  Fasting blood glucose goal (before meals) = 80 to 130 Blood glucose goal after a meal = less than 180   Hypertension: Uncontrolled; BP goal <140/90 Current treatment: Amlodipine 1055maily Lisinopril 4m58mily  Metoprolol 100mg28mimes a day Denies/reports hypotensive/hypertensive symptoms Interventions: (addressed at previous appointment)  Educated about BP goals Discussed importance of BP control to prevent stroke and kidney disease.   Hyperlipidemia: Uncontrolled; LDL goal <100 Current treatment: (addressed at previous appointment)  Rosuvastatin 20mg 17mke 0.5 tablet (=10mg) 34my Ezetmibe 10mg da38m Lipid Panel     Component Value Date/Time   CHOL 198 03/04/2021 1337   CHOL 124 10/04/2020 0854   TRIG 166.0 (H) 03/04/2021 1337   HDL 40.80 03/04/2021 1337   HDL 42 10/04/2020 0854   CHOLHDL 5 03/04/2021 1337   VLDL 33.2 03/04/2021 1337   LDLCALC 124 (H) 03/04/2021 1337   LDLCALC 62 10/04/2020 0854   LDLDIRECT 131.0 01/08/2020 1342   LABVLDL 20 10/04/2020 0854   Interventions:  Discussed LDL and Tg goals Education provided about importance of statin therapy in prevention of heart disease  Patient Goals/Self-Care Activities Over the next 90 days, patient will:  take medications as prescribed, check glucose 2 times a day, document, and provide at future appointments, and collaborate with provider on medication access solutions  Follow Up Plan: Telephone follow up appointment with care management team member scheduled for:  2 to 4 weeks.        Medication Assistance:   Farxiga [place] Gl[place] through [place] and [facility] assistance program.  Enrollment ends 08/06/2021. Also completed application for NovoNordisk for Novolin / Novolog 70/30 mix.   Follow Up:  Patient agrees to Care Plan and Follow-up.  Plan: Telephone follow up appointment with care management team member scheduled for:  2 to 4 weeks  Carmisha Larusso  ECherre Robins Clinical Pharmacist  [facility] Care SW [facility]

## 2021-04-04 ENCOUNTER — Encounter: Payer: Self-pay | Admitting: Family Medicine

## 2021-04-04 ENCOUNTER — Other Ambulatory Visit: Payer: Self-pay | Admitting: Family Medicine

## 2021-04-04 DIAGNOSIS — E1165 Type 2 diabetes mellitus with hyperglycemia: Secondary | ICD-10-CM

## 2021-04-04 MED ORDER — METFORMIN HCL 500 MG PO TABS
ORAL_TABLET | ORAL | 1 refills | Status: DC
Start: 2021-04-04 — End: 2021-05-11

## 2021-04-08 ENCOUNTER — Ambulatory Visit: Payer: 59 | Admitting: Pharmacist

## 2021-04-08 DIAGNOSIS — E1165 Type 2 diabetes mellitus with hyperglycemia: Secondary | ICD-10-CM

## 2021-04-08 DIAGNOSIS — Z79899 Other long term (current) drug therapy: Secondary | ICD-10-CM

## 2021-04-08 NOTE — Chronic Care Management (AMB) (Signed)
Care Management   Pharmacy Note  04/08/2021 Name: Danielle Flores MRN: [id number] DOB: November 24, 1965  Subjective: Danielle Flores is a 55 y.o. year old female who is a primary care patient of Ann Held, DO. The Care Management team was consulted for assistance with care management and care coordination needs.    Coordinated with PCP, pharmacy and patient for Farxiga and Novolin 70/30 or Novolog 70/30 Mix  for  medication access assistance  in response to provider referral for pharmacy case management and/or care coordination services.   The patient was given information about Care Management services today including:  Care Management services includes personalized support from designated clinical staff supervised by the patient's primary care provider, including individualized plan of care and coordination with other care providers. 24/7 contact phone numbers for assistance for urgent and routine care needs. The patient may stop case management services at any time by phone call to the office staff.  Patient agreed to services and consent obtained.  Assessment:  Review of patient status, including review of consultants reports, laboratory and other test data, was performed as part of comprehensive evaluation and provision of chronic care management services.   SDOH (Social Determinants of Health) assessments and interventions performed:    Objective:  Lab Results  Component Value Date   CREATININE 0.90 03/04/2021   CREATININE 1.03 01/08/2020   CREATININE 0.86 10/21/2019    Lab Results  Component Value Date   HGBA1C 8.2 (H) 03/04/2021       Component Value Date/Time   CHOL 198 03/04/2021 1337   CHOL 124 10/04/2020 0854   TRIG 166.0 (H) 03/04/2021 1337   HDL 40.80 03/04/2021 1337   HDL 42 10/04/2020 0854   CHOLHDL 5 03/04/2021 1337   VLDL 33.2 03/04/2021 1337   LDLCALC 124 (H) 03/04/2021 1337   LDLCALC 62 10/04/2020 0854   LDLDIRECT 131.0 01/08/2020 1342     Other: (TSH, CBC, Vit D, etc.)  Clinical ASCVD: Yes  The ASCVD Risk score Mikey Bussing DC Jr., et al., 2013) failed to calculate for the following reasons:   The patient has a prior MI or stroke diagnosis     BP Readings from Last 3 Encounters:  03/04/21 (!) 160/84  02/18/21 (!) 163/93  01/14/21 (!) 144/88    Care Plan  No Known Allergies  Medications Reviewed Today     Reviewed by Cherre Robins, PharmD (Pharmacist) on 03/22/21 at 1539  Med List Status: <None>   Medication Order Taking? Sig Documenting Provider Last Dose Status Informant  amLODipine (NORVASC) 10 MG tablet [id number] Yes TAKE 1 TABLET(10 MG) BY MOUTH DAILY Jettie Booze, MD Taking Active   aspirin EC 81 MG EC tablet [id number] Yes Take 1 tablet (81 mg total) by mouth daily with breakfast. Roxan Hockey, MD Taking Active   blood glucose meter kit and supplies [id number] Yes Relion Prime or Dispense other brand based on patient and insurance preference. Use up to four times daily as directed. (FOR ICD-9 250.00, 250.01). Roxan Hockey, MD Taking Active   blood glucose meter kit and supplies KIT [id number] Yes Dispense based on patient and insurance preference. Use up to four times daily as directed. (FOR ICD-9 250.00, 250.01). Ann Held, DO Taking Active Self  dapagliflozin propanediol (FARXIGA) 5 MG TABS tablet [id number] Yes Take 5 mg by mouth daily. [provider] Taking Active            Med Note Antony Contras, [place] B   Tue Mar 22, 2021  3:39 PM) 03/22/2021 - approved thru 08/06/2021 [place]  ezetimibe (ZETIA) 10 MG tablet [id number] Yes Take 1 tablet (10 mg total) by mouth daily. Jettie Booze, MD Taking Active   insulin isophane & regular human (NOVOLIN 70/30 FLEXPEN) (70-30) 100 UNIT/ML KwikPen [id number] Yes Inject 22 Units into the skin in the morning and at bedtime. Carollee Herter, Kendrick Fries R, DO Taking Active   lisinopril (ZESTRIL) 40 MG tablet [id number] Yes TAKE 1 TABLET(40 MG)  BY MOUTH DAILY Jettie Booze, MD Taking Active   metFORMIN (GLUCOPHAGE) 500 MG tablet [id number] Yes TAKE 2 TABLETS(1000 MG) BY MOUTH TWICE DAILY WITH A MEAL Roma Schanz R, DO Taking Active   metoprolol tartrate (LOPRESSOR) 100 MG tablet [id number] Yes TAKE 1 TABLET BY MOUTH IN THE MORNING, AT NOON AND AT BEDTIME Ann Held, DO Taking Active   rosuvastatin (CRESTOR) 20 MG tablet [id number] Yes Take 0.5 tablets (10 mg total) by mouth daily. Jettie Booze, MD Taking Active   ticagrelor (BRILINTA) 60 MG TABS tablet [id number] Yes Take 1 tablet (60 mg total) by mouth 2 (two) times daily. Jettie Booze, MD Taking Active            Med Note Theodosia Blender Mar 22, 2021  3:39 PM) Thru 08/06/2021 - [place]            Patient Active Problem List   Diagnosis Date Noted   Pain in left wrist 02/20/2021   Pain in right wrist 02/20/2021   Pain in joint, multiple sites 01/14/2021   Hyperlipidemia associated with type 2 diabetes mellitus ([place]) 01/14/2021   Uncontrolled type 2 diabetes mellitus with hyperglycemia ([place]) 01/14/2021   CAD in native artery/Stent 09/16/2018 09/16/2018   DKA, type 2 ([place]) 09/13/2018   NSTEMI (non-ST elevated myocardial infarction) ([place]) 09/13/2018   DKA (diabetic ketoacidoses) 09/13/2018   Elevated troponin    DM (diabetes mellitus) type II uncontrolled, periph vascular disorder ([place]) 08/06/2017   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   GENERALIZED ANXIETY DISORDER 10/13/2010   CARDIAC MURMUR 10/13/2010   CHEST PAIN, HX OF 02/28/2008   ELBOW PAIN, RIGHT 11/01/2007   Hyperlipidemia LDL goal <100 10/14/2007   HYPERGLYCEMIA 10/14/2007   SICKLE CELL TRAIT 01/24/2007   Essential hypertension 01/24/2007   POSTPROCEDURAL STATUS NEC 01/24/2007    Conditions to be addressed/monitored: HTN, HLD, and DMII  Care Plan : General Pharmacy (Adult)  Updates made by Cherre Robins, PHARMD since 04/08/2021 12:00 AM      Problem: type 2 DM; hyperlipidemia; HTN   Priority: High  Onset Date: 03/08/2021  Note:   Current Barriers:  Unable to independently afford treatment regimen Unable to achieve control of type 2 DM   Pharmacist Clinical Goal(s):  Over the next 90 days, patient will verbalize ability to afford treatment regimen achieve control of diabetes as evidenced by A1c < 7.0%  through collaboration with PharmD and provider.   Interventions: 1:1 collaboration with Carollee Herter, Alferd Apa, DO regarding development and update of comprehensive plan of care as evidenced by provider attestation and co-signature Inter-disciplinary care team collaboration (see longitudinal plan of care) Comprehensive medication review performed; medication list updated in electronic medical record  Diabetes: Uncontrolled; A1c goal < 7.0%   Lab Results  Component Value Date   HGBA1C 8.2 (H) 03/04/2021   Current treatment:  Novoloin 70/30 Flex Pens - inject 22 units twice a day  Metformin 516m - take 2 tablets twice a day Farxiga 550mdaily - started 03/2021 Current glucose readings: patient states she has not checked BG since she started Fa[place]ecause she was giving the new medication time to work.  She checked BG while on the phone and BG was 150 (ate stringed cheese about 20 minutes prior)  At last appt checked on coverage of CGM DexCom for patient Patient asked about CGM DexCom. DexCom is tier 3 but requires that patient is no short acting insulin. Also patient has $2300 deductible which means monthly cost could be about $150 to 200 for sensors until she meets deductible and she states she cannot afford this. She will continue with finger sticks for now.  Interventions:  Verified that patient has been approved thru 08/06/2021 to [facility].    NovoNordisk PAP needed additional information regarding patient's household income. Need copy of proof of income. Discussed with patient that she can  submit W2, federal tax records, pay stubs. Patient thought that since she has submitted for Brinlinta PAP that she did not have to include with Novo PAP. She will bring by office.  Reviewed home blood glucose readings and reviewed goals  Fasting blood glucose goal (before meals) = 80 to 130 Blood glucose goal after a meal = less than 180   Hypertension: Uncontrolled; BP goal <140/90 Current treatment: Amlodipine 1061maily Lisinopril 48m11mily  Metoprolol 100mg63mimes a day Denies/reports hypotensive/hypertensive symptoms Interventions: (addressed at previous appointment)  Educated about BP goals Discussed importance of BP control to prevent stroke and kidney disease.   Hyperlipidemia: Uncontrolled; LDL goal <100 Current treatment: (addressed at previous appointment)  Rosuvastatin 20mg 78mke 0.5 tablet (=10mg) 89my Ezetmibe 10mg da19m Lipid Panel     Component Value Date/Time   CHOL 198 03/04/2021 1337   CHOL 124 10/04/2020 0854   TRIG 166.0 (H) 03/04/2021 1337   HDL 40.80 03/04/2021 1337   HDL 42 10/04/2020 0854   CHOLHDL 5 03/04/2021 1337   VLDL 33.2 03/04/2021 1337   LDLCALC 124 (H) 03/04/2021 1337   LDLCALC 62 10/04/2020 0854   LDLDIRECT 131.0 01/08/2020 1342   LABVLDL 20 10/04/2020 0854   Interventions:  Discussed LDL and Tg goals Education provided about importance of statin therapy in prevention of heart disease  Patient Goals/Self-Care Activities Over the next 90 days, patient will:  take medications as prescribed, check glucose 2 times a day, document, and provide at future appointments, and collaborate with provider on medication access solutions  Follow Up Plan: Telephone follow up appointment with care management team member scheduled for:  2 to 4 weeks.        Medication Assistance:   Far[facility]iga [place] [facility] [place] [facility].  Enrollment ends 08/06/2021. Also completed application for NovoNordisk for Novolin / Novolog 70/30  mix.   Follow Up:  Patient agrees to Care Plan and Follow-up.  Plan: Telephone follow up appointment with care management team member scheduled for:  2 to 4 weeks  Krisy Dix EcCherre Robins Clinical Pharmacist [facility]

## 2021-04-08 NOTE — Patient Instructions (Signed)
Visit Information  PATIENT GOALS:  Goals Addressed             This Visit's Progress    Pharmacy Care Plan - Care Coordination       Current Barriers:  Unable to independently afford treatment regimen Unable to achieve control of type 2 diabetes  Interventions: 1:1 collaboration with Zola Button, Grayling Congress, DO regarding development and update of comprehensive plan of care as evidenced by provider attestation and co-signature Inter-disciplinary care team collaboration (see longitudinal plan of care) Comprehensive medication review performed; medication list updated in electronic medical record  Diabetes: Uncontrolled; A1c goal < 7.0%   Lab Results  Component Value Date   HGBA1C 8.2 (H) 03/04/2021  Current treatment:  Novoloin 70/30 Flex Pen - inject 22 units twice a day Metformin 500mg  - take 2 tablets twice a day Farxiga 5mg  daily Interventions:  Verified she was approved for 5mg  daily from PAP. Bring in proof of household income for - W2, federal tax return, pay stubs.  Reviewed home blood glucose readings and reviewed goals  Fasting blood glucose goal (before meals) = 80 to 130 Blood glucose goal after a meal = less than 180   Hypertension: Uncontrolled; BP goal <140/90  BP Readings from Last 3 Encounters:  03/04/21 (!) 160/84  02/18/21 (!) 163/93  01/14/21 (!) 144/88  Current treatment: Amlodipine 10mg  daily Lisinopril 40mg  daily  Metoprolol 100mg  3 times a day Interventions:  Educated about blood pressure goals Blood pressure control is very important to  prevent stroke and kidney disease.   Hyperlipidemia: Uncontrolled; LDL goal <100 and triglyceride goal is <150 Current treatment: Rosuvastatin 20mg  - take 0.5 tablet (=10mg ) daily Ezetmibe 10mg  daily  Lipid Panel     Component Value Date/Time   CHOL 198 03/04/2021 1337   CHOL 124 10/04/2020 0854   TRIG 166.0 (H) 03/04/2021 1337   HDL 40.80 03/04/2021 1337   HDL 42  10/04/2020 0854   CHOLHDL 5 03/04/2021 1337   VLDL 33.2 03/04/2021 1337   LDLCALC 124 (H) 03/04/2021 1337   LDLCALC 62 10/04/2020 0854   LDLDIRECT 131.0 01/08/2020 1342   LABVLDL 20 10/04/2020 0854   Interventions:  Discussed LDL and triglyceride goals Important to continue to take rosuvastatin and meet cholesterol goal to prevent heart disease, stroke and heart attack.   Patient Goals/Self-Care Activities Over the next 90 days, patient will:  take medications as prescribed check glucose 2 times a day, document, and provide at future appointments collaborate with provider on medication access solutions  Follow Up Plan: Telephone follow up appointment with care management team member scheduled for:  2 to 4 weeks         The patient verbalized understanding of instructions, educational materials, and care plan provided today and declined offer to receive copy of patient instructions, educational materials, and care plan.   Telephone follow up appointment with care management team member scheduled for: 2 to 4 weeks  03/06/2021, PharmD Clinical Pharmacist [facility]

## 2021-04-13 ENCOUNTER — Other Ambulatory Visit: Payer: Self-pay | Admitting: Interventional Cardiology

## 2021-04-25 ENCOUNTER — Encounter: Payer: Self-pay | Admitting: Family Medicine

## 2021-05-01 NOTE — Progress Notes (Signed)
Office Visit Note  Patient: Danielle Flores             Date of Birth: Feb 28, 1966           MRN: [id number]             PCP: Ann Held, DO Referring: Ann Held, * Visit Date: 05/02/2021  Subjective:  New Patient (Initial Visit) (No joint pain since patient started taking Omega 3-GL)  History of Present Illness: Danielle Flores is a 55 y.o. female here for positive ANA with bilateral shoulder and bilateral wrist pains.  Pain in both shoulders and wrists have been ongoing for months but she started taking omega-3 GL supplement in June subsequently feels that her symptoms are now completely improved.  Before this was having pain on a daily basis particularly with certain movements and lifting overhead.  She has noticed some swelling in the wrists and hands but this also improved.  She denies any skin rashes, alopecia, oral ulcers, lymphadenopathy, Raynaud's, or history of blood clots. She was also found to have mild hyperuricemia at 7.2 but no recent known history of gouty arthritis. Lupus anticoagulant test was positive in July. She takes 81 mg ASA daily and on ticagrelor 60 mg BID.  She has no personal history of blood clots no miscarriage history.  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   Labs reviewed 02/2021 AAN 1:160 speckled LA pos CBC MCV 72.6 CMP AST 45 ALT 55  01/2021 ANA 1:320 speckled RF neg Uric acid 7.2 ESR 25   Activities of Daily Living:  Patient reports morning stiffness for 0  none .   Patient Denies nocturnal pain.  Difficulty dressing/grooming: Denies Difficulty climbing stairs: Denies Difficulty getting out of chair: Denies Difficulty using hands for taps, buttons, cutlery, and/or writing: Denies  Review of Systems  Constitutional:  Negative for fatigue.  HENT:  Negative for  mouth dryness.   Eyes:  Negative for dryness.  Respiratory:  Negative for shortness of breath.   Cardiovascular:  Negative for swelling in legs/feet.  Gastrointestinal:  Negative for constipation.  Endocrine: Negative for excessive thirst.  Genitourinary:  Negative for difficulty urinating.  Musculoskeletal:  Negative for joint pain and joint pain.  Skin:  Negative for rash.  Allergic/Immunologic: Negative for susceptible to infections.  Neurological:  Negative for weakness.  Hematological:  Negative for bruising/bleeding tendency.  Psychiatric/Behavioral:  Negative for sleep disturbance.    PMFS History:  Patient Active Problem List   Diagnosis Date Noted   Positive ANA (antinuclear antibody) 05/02/2021   Lupus anticoagulant positive 05/02/2021   Pain in left wrist 02/20/2021   Pain in right wrist 02/20/2021   Pain in joint, multiple sites 01/14/2021   Hyperlipidemia associated with type 2 diabetes mellitus ([place]) 01/14/2021   Uncontrolled type 2 diabetes mellitus with hyperglycemia ([place]) 01/14/2021   CAD in native artery/Stent 09/16/2018 09/16/2018   DKA, type 2 ([place]) 09/13/2018   NSTEMI (non-ST elevated myocardial infarction) ([place]) 09/13/2018   DKA (diabetic ketoacidoses) 09/13/2018   Elevated troponin    DM (diabetes mellitus) type II uncontrolled, periph vascular disorder ([place]) 08/06/2017   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   GENERALIZED ANXIETY DISORDER 10/13/2010   CARDIAC MURMUR 10/13/2010   CHEST PAIN, HX OF 02/28/2008   ELBOW PAIN, RIGHT 11/01/2007   Hyperlipidemia LDL goal <100  10/14/2007   HYPERGLYCEMIA 10/14/2007   SICKLE CELL TRAIT 01/24/2007   Essential hypertension 01/24/2007   POSTPROCEDURAL STATUS NEC 01/24/2007    Past Medical History:  Diagnosis Date   Hyperglycemia    Hyperlipidemia    Hypertension     Family History  Problem Relation Age of Onset   Diabetes Mother    Diabetes Sister    Sickle cell anemia Paternal Aunt     Heart disease Maternal Grandmother        heart enlargement   Diabetes Other        1st degree relative   Past Surgical History:  Procedure Laterality Date   CESAREAN SECTION     CORONARY STENT INTERVENTION N/A 09/16/2018   Procedure: CORONARY STENT INTERVENTION;  Surgeon: Jettie Booze, MD;  Location: [place] [facility];  Service: Cardiovascular;  Laterality: N/A;   LEFT HEART CATH AND CORONARY ANGIOGRAPHY N/A 09/16/2018   Procedure: LEFT HEART CATH AND CORONARY ANGIOGRAPHY;  Surgeon: Jettie Booze, MD;  Location: [place] [facility];  Service: Cardiovascular;  Laterality: N/A;   Social History   Social History Narrative   Not on file   Immunization History  Administered Date(s) Administered   Engineer, 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  HENT:     Mouth/Throat:     Mouth: Mucous membranes are moist.     Pharynx: Oropharynx is clear.  Cardiovascular:     Rate and Rhythm: Normal rate and regular rhythm.  Pulmonary:     Effort: Pulmonary effort is normal.     Breath sounds: Normal breath sounds.  Musculoskeletal:     Right lower leg: No edema.     Left lower leg: No edema.  Skin:    General: Skin is dry.     Findings: No rash.  Neurological:     Mental Status: She is alert.     Deep Tendon Reflexes: Reflexes normal.  Psychiatric:        Mood and Affect: Mood normal.     Musculoskeletal Exam:  Neck full ROM no tenderness Shoulders full ROM no tenderness or swelling Elbows full ROM no tenderness or swelling Wrists full ROM no tenderness or swelling Fingers full ROM no tenderness or swelling, nodule on the left 1st  IP joint Knees full ROM no tenderness or swelling Ankles full ROM no tenderness or swelling   Investigation: No additional findings.  Imaging: No results found.  Recent Labs: Lab Results  Component Value Date   WBC 6.4 03/04/2021   HGB 12.8 03/04/2021   PLT 271.0 03/04/2021   NA 140 03/04/2021   K 3.9 03/04/2021   CL 101 03/04/2021   CO2 28 03/04/2021   GLUCOSE 165 (H) 03/04/2021   BUN 12 03/04/2021   CREATININE 0.90 03/04/2021   BILITOT 0.3 03/04/2021   ALKPHOS 109 03/04/2021   AST 45 (H) 03/04/2021   ALT 55 (H) 03/04/2021   PROT 7.6 03/04/2021   ALBUMIN 4.4 03/04/2021   CALCIUM 10.0 03/04/2021   GFRAA 84 12/20/2018    Speciality  Comments: No specialty comments available.  Procedures:  No procedures performed Allergies: Patient has no known allergies.   Assessment / Plan:     Visit Diagnoses: Positive ANA (antinuclear antibody)  Positive ANA to moderately high titer but no specific clinical signs or symptoms.  No joint inflammation on exam and symptoms reportedly improved with addition of over-the-counter supplement.  Recommend against more extensive serology work-up in the absence of clinical picture but she can follow-up as needed if symptoms recur to repeat exam and more workup.  Pain in joint, multiple sites  Symptoms now improved she attributes this to addition of over-the-counter supplement omega-3 GL.  Lupus anticoagulant positive  Positive serology no clinical history of thrombotic events or obstetric APS criteria.  Positive results during dual antiplatelet therapy but no recent heparin or direct oral anticoagulant exposure.  Based on this I suspect a false positive but this should be followed up with repeat test at least 12 weeks after the initial on 7/29.  Orders: No orders of the defined types were placed in this encounter.  No orders of the defined types were placed in this encounter.    Follow-Up Instructions: Return if symptoms worsen or fail  to improve.   Collier Salina, MD  Note - This record has been created using [employer].  Chart creation errors have been sought, but may not always  have been located. Such creation errors do not reflect on  the standard of medical care.

## 2021-05-02 ENCOUNTER — Encounter: Payer: Self-pay | Admitting: Internal Medicine

## 2021-05-02 ENCOUNTER — Other Ambulatory Visit: Payer: Self-pay

## 2021-05-02 ENCOUNTER — Ambulatory Visit (INDEPENDENT_AMBULATORY_CARE_PROVIDER_SITE_OTHER): Payer: 59 | Admitting: Internal Medicine

## 2021-05-02 VITALS — BP 151/77 | HR 66 | Resp 16 | Ht 65.0 in | Wt 214.8 lb

## 2021-05-02 DIAGNOSIS — R760 Raised antibody titer: Secondary | ICD-10-CM | POA: Insufficient documentation

## 2021-05-02 DIAGNOSIS — M2550 Pain in unspecified joint: Secondary | ICD-10-CM | POA: Diagnosis not present

## 2021-05-02 DIAGNOSIS — R768 Other specified abnormal immunological findings in serum: Secondary | ICD-10-CM | POA: Insufficient documentation

## 2021-05-02 NOTE — Patient Instructions (Signed)
Lupus Anticoagulant Panel Test Why am I having this test? The lupus anticoagulant panel test can be used to help find the cause of abnormal blood clotting. Your health care provider may recommend this test if: You are a woman and have had repeated miscarriages, preterm labor, or high blood pressure in pregnancy (preeclampsia). You have had previous blood tests showing a longer-than-normal blood clotting time or a lower-than-normal number of platelets (thrombocytopenia). Your health care provider suspects that you have a condition called antiphospholipid syndrome. You have systemic lupus erythematosus (SLE, or lupus) or your health care provider suspects that you have this condition. Your health care provider suspects that you have some type of rheumatic disease. What is being tested? This test checks your blood for certain autoantibodies (lupus anticoagulant autoantibodies) that can interfere with the normal blood clotting process. Autoantibodies are proteins that your body's defense system (immune system) makes to help fight invading germs, such as bacteria and viruses. Sometimes, the body mistakes normal tissues for abnormal ones, and it attacks those tissues as if they were invading germs. This is called an autoimmune response. The autoantibodies checked for in this test can mistakenly attack phospholipids, which are a normal part of many types of cells in the body. When blood clotting cells (platelets) are attacked by antiphospholipid antibodies, abnormal blood clotting can occur. When this happens, you are at an increased risk of developing repeated blood clots in your arteries and veins. You are also at an increased risk for heart attack or stroke. What kind of sample is taken? A blood sample is required for this test. It is usually collected by inserting a needle into a blood vessel or by sticking a finger with a small needle. Tell a health care provider about: All medicines you are taking,  including vitamins, herbs, eye drops, creams, and over-the-counter medicines. Any medical conditions you have. How are the results reported? Your test results will be reported as values that indicate whether lupus anticoagulant autoantibodies were found in your blood. Your health care provider will compare your results to normal values that were established after testing a large group of people (reference values). Reference values may vary among labs and hospitals. For this test, common normal reference values are: Less than 11 MPL (IgM phospholipid units). Less than 23 GPL (IgG phospholipid units). What do the results mean? Results that are higher than the reference values may indicate the following health conditions: Lupus. Antiphospholipid syndrome. Talk with your health care provider about what your results mean. Questions to ask your health care provider Ask your health care provider, or the department that is doing the test: When will my results be ready? How will I get my results? What are my treatment options? What other tests do I need? What are my next steps? Summary The lupus anticoagulant panel test can be used to help find the cause of abnormal blood clotting. This test checks your blood for certain proteins (autoantibodies) that can interfere with the normal blood clotting process. The autoantibodies checked for in this test can mistakenly attack phospholipids, which are a normal part of many types of cells in the body. If the autoantibodies attack blood clotting cells (platelets), you may develop frequent blood clots. Talk with your health care provider about what your test results mean.  Antinuclear Antibody Test Why am I having this test? This is a test that is used to help diagnose systemic lupus erythematosus (SLE) and other autoimmune diseases. An autoimmune disease is a disease in which  the body's own defense (immune)system attacks its organs. What is being  tested? This test checks for antinuclear antibodies (ANA) in the blood. The presence of ANA is associated with several autoimmune diseases. It is seen in almost all patients with lupus. What kind of sample is taken? A blood sample is required for this test. It is usually collected by inserting a needle into a blood vessel. How are the results reported? Your test results will be reported as either positive or negative. A false-positive result can occur. A false positive is incorrect because it means that a condition is present when it is not. What do the results mean? A positive test result may mean that you have: Lupus. Other autoimmune diseases, such as rheumatoid arthritis, scleroderma, or Sjgren syndrome. Conditions that may cause a false-positive result include: Liver dysfunction. Myasthenia gravis. Infectious mononucleosis. Talk with your health care provider about what your results mean. Questions to ask your health care provider Ask your health care provider, or the department that is doing the test: When will my results be ready? How will I get my results? What are my treatment options? What other tests do I need? What are my next steps? Summary This is a test that is used to help diagnose systemic lupus erythematosus (SLE) and other autoimmune diseases. An autoimmune disease is a disease in which the body's own defense (immune)system attacks the body. This test checks for antinuclear antibodies (ANA) in the blood. The presence of ANA is associated with several autoimmune diseases. It is seen in almost all patients with lupus. Your test results will be reported as either positive or negative. Talk with your health care provider about what your results mean. This information is not intended to replace advice given to you by your health care provider. Make sure you discuss any questions you have with your health care provider. Document Revised: 03/26/2020 Document Reviewed:  03/26/2020 Elsevier Patient Education  2022 [employer].

## 2021-05-10 ENCOUNTER — Other Ambulatory Visit: Payer: Self-pay | Admitting: Interventional Cardiology

## 2021-05-10 DIAGNOSIS — I10 Essential (primary) hypertension: Secondary | ICD-10-CM

## 2021-05-11 ENCOUNTER — Other Ambulatory Visit: Payer: Self-pay | Admitting: Family Medicine

## 2021-05-11 DIAGNOSIS — E1165 Type 2 diabetes mellitus with hyperglycemia: Secondary | ICD-10-CM

## 2021-05-12 LAB — HM MAMMOGRAPHY

## 2021-05-13 ENCOUNTER — Ambulatory Visit: Payer: 59 | Admitting: Pharmacist

## 2021-05-13 DIAGNOSIS — Z79899 Other long term (current) drug therapy: Secondary | ICD-10-CM

## 2021-05-13 DIAGNOSIS — E1165 Type 2 diabetes mellitus with hyperglycemia: Secondary | ICD-10-CM

## 2021-05-13 NOTE — Patient Instructions (Signed)
Visit Information  PATIENT GOALS:  Goals Addressed             This Visit's Progress    Pharmacy Care Plan - Care Coordination       Current Barriers:  Unable to independently afford treatment regimen Unable to achieve control of type 2 diabetes  Interventions: 1:1 collaboration with Zola Button, Grayling Congress, DO regarding development and update of comprehensive plan of care as evidenced by provider attestation and co-signature Inter-disciplinary care team collaboration (see longitudinal plan of care) Comprehensive medication review performed; medication list updated in electronic medical record  Diabetes: Uncontrolled; A1c goal < 7.0%   Lab Results  Component Value Date   HGBA1C 8.2 (H) 03/04/2021  Current treatment:  Novoloin 70/30 Flex Pen - inject 22 units twice a day Metformin 500mg  - take 2 tablets twice a day Farxiga 5mg  daily Interventions:  Verified she was approved for 5mg  daily from PAP Completed application for / Novolin 70/30 insullin.  Reviewed home blood glucose readings and reviewed goals  Fasting blood glucose goal (before meals) = 80 to 130 Blood glucose goal after a meal = less than 180   Hypertension: Uncontrolled; BP goal <140/90  BP Readings from Last 3 Encounters:  05/02/21 (!) 151/77  03/04/21 (!) 160/84  02/18/21 (!) 163/93  Current treatment: Amlodipine 10mg  daily Lisinopril 40mg  daily  Metoprolol 100mg  3 times a day Interventions:  Educated about blood pressure goals Blood pressure control is very important to  prevent stroke and kidney disease.   Hyperlipidemia: Uncontrolled; LDL goal <100 and triglyceride goal is <150 Current treatment: Rosuvastatin 20mg  - take 0.5 tablet (=10mg ) daily Ezetmibe 10mg  daily  Lipid Panel     Component Value Date/Time   CHOL 198 03/04/2021 1337   CHOL 124 10/04/2020 0854   TRIG 166.0 (H) 03/04/2021 1337   HDL 40.80 03/04/2021 1337   HDL 42 10/04/2020 0854   CHOLHDL 5  03/04/2021 1337   VLDL 33.2 03/04/2021 1337   LDLCALC 124 (H) 03/04/2021 1337   LDLCALC 62 10/04/2020 0854   LDLDIRECT 131.0 01/08/2020 1342   LABVLDL 20 10/04/2020 0854   Interventions:  Discussed LDL and triglyceride goals Important to continue to take rosuvastatin and meet cholesterol goal to prevent heart disease, stroke and heart attack.   Patient Goals/Self-Care Activities Over the next 90 days, patient will:  take medications as prescribed check glucose 2 times a day, document, and provide at future appointments collaborate with provider on medication access solutions  Follow Up Plan: Telephone follow up appointment with care management team member scheduled for:  2 to 4 weeks         The patient verbalized understanding of instructions, educational materials, and care plan provided today and declined offer to receive copy of patient instructions, educational materials, and care plan.   Telephone follow up appointment with care management team member scheduled for: 2 to 4 weeks  03/06/2021, PharmD Clinical Pharmacist [facility]

## 2021-05-13 NOTE — Chronic Care Management (AMB) (Signed)
Care Management   Pharmacy Note  05/13/2021 Name: Danielle Flores MRN: [id number] DOB: April 17, 1966  Subjective: Danielle Flores is a 55 y.o. year old female who is a primary care patient of Ann Held, DO. The Care Management team was consulted for assistance with care management and care coordination needs.    Coordinated with PCP, pharmacy and patient for Farxiga and Novolin 70/30 or Novolog 70/30 Mix  for  medication access assistance  in response to provider referral for pharmacy case management and/or care coordination services.   The patient was given information about Care Management services today including:  Care Management services includes personalized support from designated clinical staff supervised by the patient's primary care provider, including individualized plan of care and coordination with other care providers. 24/7 contact phone numbers for assistance for urgent and routine care needs. The patient may stop case management services at any time by phone call to the office staff.  Patient agreed to services and consent obtained.  Assessment:  Review of patient status, including review of consultants reports, laboratory and other test data, was performed as part of comprehensive evaluation and provision of chronic care management services.   SDOH (Social Determinants of Health) assessments and interventions performed:    Objective:  Lab Results  Component Value Date   CREATININE 0.90 03/04/2021   CREATININE 1.03 01/08/2020   CREATININE 0.86 10/21/2019    Lab Results  Component Value Date   HGBA1C 8.2 (H) 03/04/2021       Component Value Date/Time   CHOL 198 03/04/2021 1337   CHOL 124 10/04/2020 0854   TRIG 166.0 (H) 03/04/2021 1337   HDL 40.80 03/04/2021 1337   HDL 42 10/04/2020 0854   CHOLHDL 5 03/04/2021 1337   VLDL 33.2 03/04/2021 1337   LDLCALC 124 (H) 03/04/2021 1337   LDLCALC 62 10/04/2020 0854   LDLDIRECT 131.0 01/08/2020 1342     Other: (TSH, CBC, Vit D, etc.)  Clinical ASCVD: Yes  The ASCVD Risk score (Arnett DK, et al., 2019) failed to calculate for the following reasons:   The patient has a prior MI or stroke diagnosis     BP Readings from Last 3 Encounters:  05/02/21 (!) 151/77  03/04/21 (!) 160/84  02/18/21 (!) 163/93    Care Plan  No Known Allergies  Medications Reviewed Today     Reviewed by Cherre Robins, PharmD (Pharmacist) on 05/13/21 at 1544  Med List Status: <None>   Medication Order Taking? Sig Documenting Provider Last Dose Status Informant  amLODipine (NORVASC) 10 MG tablet [id number] Yes TAKE 1 TABLET(10 MG) BY MOUTH DAILY Jettie Booze, MD Taking Active   aspirin EC 81 MG EC tablet [id number] Yes Take 1 tablet (81 mg total) by mouth daily with breakfast. Roxan Hockey, MD Taking Active   blood glucose meter kit and supplies [id number] Yes Relion Prime or Dispense other brand based on patient and insurance preference. Use up to four times daily as directed. (FOR ICD-9 250.00, 250.01). Roxan Hockey, MD Taking Active   blood glucose meter kit and supplies KIT [id number] Yes Dispense based on patient and insurance preference. Use up to four times daily as directed. (FOR ICD-9 250.00, 250.01). Ann Held, DO Taking Active Self  dapagliflozin propanediol (FARXIGA) 5 MG TABS tablet [id number] Yes Take 5 mg by mouth daily. [provider] Taking Active            Med Note Antony Contras, [place] Mar 22, 2021  3:39 PM) 03/22/2021 - [place] thru 08/06/[place]  ezetimibe (ZETIA) 10 MG tablet [id number] Yes Take 1 tablet (10 mg total) by mouth daily. Jettie Booze, MD Taking Active   insulin isophane & regular human (NOVOLIN 70/30 FLEXPEN) (70-30) 100 UNIT/ML KwikPen [id number] Yes Inject 22 Units into the skin in the morning and at bedtime. Carollee Herter, Kendrick Fries R, DO Taking Active   lisinopril (ZESTRIL) 40 MG tablet [id number] Yes TAKE 1 TABLET(40 MG)  BY MOUTH DAILY Jettie Booze, MD Taking Active   metFORMIN (GLUCOPHAGE) 500 MG tablet [id number] Yes TAKE 2 TABLETS(1000 MG) BY MOUTH TWICE DAILY WITH A MEAL Roma Schanz R, DO Taking Active   metoprolol tartrate (LOPRESSOR) 100 MG tablet [id number] Yes TAKE 1 TABLET BY MOUTH IN THE MORNING, AT NOON AND AT BEDTIME Ann Held, DO Taking Active   rosuvastatin (CRESTOR) 20 MG tablet [id number] Yes Take 1 tablet (20 mg total) by mouth daily. Please make overdue appt with Dr. Irish Lack before anymore refills. Thank you 1st attempt Jettie Booze, MD Taking Active   ticagrelor (BRILINTA) 60 MG TABS tablet [id number] Yes Take 1 tablet (60 mg total) by mouth 2 (two) times daily. Jettie Booze, MD Taking Active            Med Note Theodosia Blender Mar 22, 2021  3:39 PM) Thru 08/06/2021 - [place]            Patient Active Problem List   Diagnosis Date Noted   Positive ANA (antinuclear antibody) 05/02/2021   Lupus anticoagulant positive 05/02/2021   Pain in left wrist 02/20/2021   Pain in right wrist 02/20/2021   Pain in joint, multiple sites 01/14/2021   Hyperlipidemia associated with type 2 diabetes mellitus ([place]) 01/14/2021   Uncontrolled type 2 diabetes mellitus with hyperglycemia ([place]) 01/14/2021   CAD in native artery/Stent 09/16/2018 09/16/2018   DKA, type 2 ([place]) 09/13/2018   NSTEMI (non-ST elevated myocardial infarction) ([place]) 09/13/2018   DKA (diabetic ketoacidoses) 09/13/2018   Elevated troponin    DM (diabetes mellitus) type II uncontrolled, periph vascular disorder 08/06/2017   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   GENERALIZED ANXIETY DISORDER 10/13/2010   CARDIAC MURMUR 10/13/2010   CHEST PAIN, HX OF 02/28/2008   ELBOW PAIN, RIGHT 11/01/2007   Hyperlipidemia LDL goal <100 10/14/2007   HYPERGLYCEMIA 10/14/2007   SICKLE CELL TRAIT 01/24/2007   Essential hypertension 01/24/2007   POSTPROCEDURAL STATUS NEC 01/24/2007     Conditions to be addressed/monitored: HTN, HLD, and DMII  Care Plan : General Pharmacy (Adult)  Updates made by Cherre Robins, PHARMD since 05/13/2021 12:00 AM     Problem: type 2 DM; hyperlipidemia; HTN   Priority: High  Onset Date: 03/08/2021  Note:   Current Barriers:  Unable to independently afford treatment regimen Unable to achieve control of type 2 DM   Pharmacist Clinical Goal(s):  Over the next 90 days, patient will verbalize ability to afford treatment regimen achieve control of diabetes as evidenced by A1c < 7.0%  through collaboration with PharmD and provider.   Interventions: 1:1 collaboration with Carollee Herter, Alferd Apa, DO regarding development and update of comprehensive plan of care as evidenced by provider attestation and co-signature Inter-disciplinary care team collaboration (see longitudinal plan of care) Comprehensive medication review performed; medication list updated in electronic medical record  Diabetes: Uncontrolled; A1c goal < 7.0%   Lab Results  Component Value Date   HGBA1C 8.2 (H) 03/04/2021   Current treatment:  Novoloin 70/30 Flex Pens - inject 22 units twice a day Metformin 573m - take 2 tablets twice a day Farxiga 583mdaily - started 03/2021 At last appt checked on coverage of CGM DexCom for patient Patient asked about CGM DexCom. DexCom is tier 3 but requires that patient is no short acting insulin. Also patient has $2300 deductible which means monthly cost could be about $150 to 200 for sensors until she meets deductible and she states she cannot afford this. She will continue with finger sticks for now.  Patient has returned financial records and application for [employer] assistance program.  Interventions:  Patient has been approved thru 08/06/2021 to [facility].    [employer] PAP needed additional information regarding patient's household income. She brought in today. Completed application and  forwarded to PCP to review and sign.  Reviewed home blood glucose readings and reviewed goals  Fasting blood glucose goal (before meals) = 80 to 130 Blood glucose goal after a meal = less than 180   Hypertension: Uncontrolled; BP goal <140/90 Current treatment: Amlodipine 1026maily Lisinopril 48m28mily  Metoprolol 100mg31mimes a day Denies/reports hypotensive/hypertensive symptoms Interventions: (addressed at previous appointment)  Educated about BP goals Discussed importance of BP control to prevent stroke and kidney disease.   Hyperlipidemia: Uncontrolled; LDL goal <100 Current treatment: (addressed at previous appointment)  Rosuvastatin 20mg 27mke 0.5 tablet (=10mg) 1my Ezetmibe 10mg da12m Lipid Panel     Component Value Date/Time   CHOL 198 03/04/2021 1337   CHOL 124 10/04/2020 0854   TRIG 166.0 (H) 03/04/2021 1337   HDL 40.80 03/04/2021 1337   HDL 42 10/04/2020 0854   CHOLHDL 5 03/04/2021 1337   VLDL 33.2 03/04/2021 1337   LDLCALC 124 (H) 03/04/2021 1337   LDLCALC 62 10/04/2020 0854   LDLDIRECT 131.0 01/08/2020 1342   LABVLDL 20 10/04/2020 0854   Interventions:  Discussed LDL and Tg goals Education provided about importance of statin therapy in prevention of heart disease  Patient Goals/Self-Care Activities Over the next 90 days, patient will:  take medications as prescribed, check glucose 2 times a day, document, and provide at future appointments, and collaborate with provider on medication access solutions  Follow Up Plan: Telephone follow up appointment with care management team member scheduled for:  2 to 4 weeks.        Medication Assistance:   [facility] [place] [facility] [place] [facility].  Enrollment ends 08/06/2021. Also completed application for [employer] for Novolin 70/30 mix.- application will be faxed today   Follow Up:  Patient agrees to Care Plan and Follow-up.  Plan: Telephone follow up appointment with care  management team member scheduled for:  2 to 4 weeks  Luvena Wentling EcCherre Robins Clinical Pharmacist [place] [facility]

## 2021-05-27 ENCOUNTER — Telehealth: Payer: 59

## 2021-06-07 ENCOUNTER — Other Ambulatory Visit: Payer: Self-pay

## 2021-06-07 ENCOUNTER — Other Ambulatory Visit (INDEPENDENT_AMBULATORY_CARE_PROVIDER_SITE_OTHER): Payer: 59

## 2021-06-07 DIAGNOSIS — E1169 Type 2 diabetes mellitus with other specified complication: Secondary | ICD-10-CM

## 2021-06-07 DIAGNOSIS — E785 Hyperlipidemia, unspecified: Secondary | ICD-10-CM | POA: Diagnosis not present

## 2021-06-07 DIAGNOSIS — E1165 Type 2 diabetes mellitus with hyperglycemia: Secondary | ICD-10-CM

## 2021-06-07 LAB — LIPID PANEL
Cholesterol: 100 mg/dL (ref 0–200)
HDL: 32.2 mg/dL — ABNORMAL LOW (ref >39.00)
LDL Cholesterol: 48 mg/dL (ref 0–99)
NonHDL: 67.67
Total CHOL/HDL Ratio: 3
Triglycerides: 96 mg/dL (ref 0.0–149.0)
VLDL: 19.2 mg/dL (ref 0.0–40.0)

## 2021-06-07 LAB — COMPREHENSIVE METABOLIC PANEL
ALT: 43 U/L — ABNORMAL HIGH (ref 0–35)
AST: 38 U/L — ABNORMAL HIGH (ref 0–37)
Albumin: 4.4 g/dL (ref 3.5–5.2)
Alkaline Phosphatase: 102 U/L (ref 39–117)
BUN: 10 mg/dL (ref 6–23)
CO2: 27 mEq/L (ref 19–32)
Calcium: 10 mg/dL (ref 8.4–10.5)
Chloride: 103 mEq/L (ref 96–112)
Creatinine, Ser: 1.11 mg/dL (ref 0.40–1.20)
GFR: 55.09 mL/min — ABNORMAL LOW (ref >60.00)
Glucose, Bld: 127 mg/dL — ABNORMAL HIGH (ref 70–99)
Potassium: 4 mEq/L (ref 3.5–5.1)
Sodium: 139 mEq/L (ref 135–145)
Total Bilirubin: 0.5 mg/dL (ref 0.2–1.2)
Total Protein: 7.5 g/dL (ref 6.0–8.3)

## 2021-06-07 LAB — HEMOGLOBIN A1C: Hgb A1c MFr Bld: 8.4 % — ABNORMAL HIGH (ref 4.6–6.5)

## 2021-06-10 ENCOUNTER — Other Ambulatory Visit: Payer: Self-pay | Admitting: Interventional Cardiology

## 2021-06-10 DIAGNOSIS — I10 Essential (primary) hypertension: Secondary | ICD-10-CM

## 2021-06-14 ENCOUNTER — Other Ambulatory Visit: Payer: Self-pay

## 2021-06-14 MED ORDER — DAPAGLIFLOZIN PROPANEDIOL 10 MG PO TABS: 10.0000 mg | ORAL_TABLET | Freq: Every day | ORAL | 2 refills | Status: DC

## 2021-06-27 ENCOUNTER — Other Ambulatory Visit: Payer: Self-pay | Admitting: Family Medicine

## 2021-06-27 ENCOUNTER — Encounter: Payer: Self-pay | Admitting: Family Medicine

## 2021-06-27 DIAGNOSIS — I10 Essential (primary) hypertension: Secondary | ICD-10-CM

## 2021-06-28 ENCOUNTER — Encounter: Payer: Self-pay | Admitting: Family Medicine

## 2021-06-28 ENCOUNTER — Telehealth (INDEPENDENT_AMBULATORY_CARE_PROVIDER_SITE_OTHER): Payer: 59 | Admitting: Family Medicine

## 2021-06-28 ENCOUNTER — Other Ambulatory Visit: Payer: Self-pay

## 2021-06-28 DIAGNOSIS — J069 Acute upper respiratory infection, unspecified: Secondary | ICD-10-CM | POA: Diagnosis not present

## 2021-06-28 MED ORDER — BENZONATATE 200 MG PO CAPS: 200.0000 mg | ORAL_CAPSULE | Freq: Two times a day (BID) | ORAL | 0 refills | Status: DC | PRN

## 2021-06-28 NOTE — Progress Notes (Signed)
Chief Complaint  Patient presents with   Cough    Delray Alt Hara here for URI complaints. Due to COVID-19 pandemic, we are interacting via web portal for an electronic face-to-face visit. I verified patient's ID using 2 identifiers. Patient agreed to proceed with visit via this method. Patient is at home, I am at office. Patient and I are present for visit.   Duration: 9 days  Getting better.  Associated symptoms: sinus congestion and chest congestion, coughing Denies: sinus pain, rhinorrhea, itchy watery eyes, ear pain, ear drainage, sore throat, wheezing, shortness of breath, myalgia, and fevers, N/V/D,  loss of smell/taste Treatment to date: tea, supportive care Sick contacts: None known Tested neg for covid.   Past Medical History:  Diagnosis Date   Hyperglycemia    Hyperlipidemia    Hypertension     Objective No conversational dyspnea Age appropriate judgment and insight Nml affect and mood  Viral URI with cough - Plan: benzonatate (TESSALON) 200 MG capsule  Improving, Tessalon Perles prn. Continue to push fluids, practice good hand hygiene, cover mouth when coughing. F/u prn. If starting to experience irreplaceable fluid loss, shaking, or shortness of breath, seek immediate care. Pt voiced understanding and agreement to the plan.  Jilda Roche [place], DO 06/28/21 2:10 PM

## 2021-06-29 ENCOUNTER — Other Ambulatory Visit: Payer: Self-pay | Admitting: Family Medicine

## 2021-06-29 ENCOUNTER — Encounter: Payer: Self-pay | Admitting: Family Medicine

## 2021-06-29 DIAGNOSIS — I10 Essential (primary) hypertension: Secondary | ICD-10-CM

## 2021-07-04 ENCOUNTER — Other Ambulatory Visit: Payer: Self-pay | Admitting: Family Medicine

## 2021-07-04 DIAGNOSIS — I10 Essential (primary) hypertension: Secondary | ICD-10-CM

## 2021-07-04 MED ORDER — METOPROLOL TARTRATE 100 MG PO TABS: ORAL_TABLET | ORAL | 1 refills | Status: DC

## 2021-07-06 ENCOUNTER — Other Ambulatory Visit: Payer: Self-pay

## 2021-07-06 ENCOUNTER — Encounter (INDEPENDENT_AMBULATORY_CARE_PROVIDER_SITE_OTHER): Payer: Self-pay | Admitting: Ophthalmology

## 2021-07-06 ENCOUNTER — Encounter: Payer: Self-pay | Admitting: Interventional Cardiology

## 2021-07-06 ENCOUNTER — Ambulatory Visit (INDEPENDENT_AMBULATORY_CARE_PROVIDER_SITE_OTHER): Payer: 59 | Admitting: Ophthalmology

## 2021-07-06 DIAGNOSIS — H34832 Tributary (branch) retinal vein occlusion, left eye, with macular edema: Secondary | ICD-10-CM

## 2021-07-06 DIAGNOSIS — H25813 Combined forms of age-related cataract, bilateral: Secondary | ICD-10-CM

## 2021-07-06 DIAGNOSIS — E119 Type 2 diabetes mellitus without complications: Secondary | ICD-10-CM

## 2021-07-06 DIAGNOSIS — H3581 Retinal edema: Secondary | ICD-10-CM

## 2021-07-06 DIAGNOSIS — H35033 Hypertensive retinopathy, bilateral: Secondary | ICD-10-CM | POA: Diagnosis not present

## 2021-07-06 DIAGNOSIS — I10 Essential (primary) hypertension: Secondary | ICD-10-CM | POA: Diagnosis not present

## 2021-07-06 MED ORDER — BEVACIZUMAB CHEMO INJECTION 1.25MG/0.05ML SYRINGE FOR KALEIDOSCOPE
1.2500 mg | INTRAVITREAL | Status: AC | PRN
  Administered 2021-07-06: 1.25 mg via INTRAVITREAL

## 2021-07-06 NOTE — Progress Notes (Addendum)
[facility] Note  07/06/2021     CHIEF COMPLAINT Patient presents for Retina Evaluation   HISTORY OF PRESENT ILLNESS: Danielle Flores is a 55 y.o. female who presents to the clinic today for:   HPI     Retina Evaluation   In left eye.  This started 2 weeks ago.  I, the attending physician,  performed the HPI with the patient and updated documentation appropriately.        Comments   Retina eval per Dr. Delman Cheadle for BRVO OS- A little gray area in vision OS x2 weeks.  It is getting better. Vision seems ok.  Denies FOLs.  DM II x4 years, BS 125 about a week ago (doesn't check everyday).  A1C 8.4      Last edited by Bernarda Caffey, MD on 07/06/2021 11:59 AM.    Pt states about 2 weeks she started seeing grey spots in her vision, she went to see Dr. Delman Cheadle who told her a vessel ruptured in her left eye, pt has high blood pressure and takes 3 medications, she states she checks her BP at home, but has checked it in a couple weeks, last time she checked it was 125/82, pt sees a rheumatologist who discovered she has ANA, she does not have hx of blood clots, but has been taking a blood thinner (Brilinta) since 2019, pt states right before this episode happened her dosage of Brillinta was lowered from 62m to 643mand she was taken off aspirin bc she had a nose bleed that took 3 hours to stop, pt states she is also diabetic and takes insulin and oral medication  Referring physician: GoSharyne PeachMD [address] [place]  [id number] INFORMATION:   Selected notes from the MEDICAL RECORD NUMBER Referred by Dr. LETruman Hayward Ocular Hx- PMH-    CURRENT MEDICATIONS: No current outpatient medications on file. (Ophthalmic Drugs)   No current facility-administered medications for this visit. (Ophthalmic Drugs)   Current Outpatient Medications (Other)  Medication Sig   amLODipine (NORVASC) 10 MG tablet TAKE 1 TABLET(10 MG) BY MOUTH DAILY    dapagliflozin propanediol (FARXIGA) 10 MG TABS tablet Take 1 tablet (10 mg total) by mouth daily before breakfast.   ezetimibe (ZETIA) 10 MG tablet Take 1 tablet (10 mg total) by mouth daily.   insulin isophane & regular human (NOVOLIN 70/30 FLEXPEN) (70-30) 100 UNIT/ML KwikPen Inject 22 Units into the skin in the morning and at bedtime.   lisinopril (ZESTRIL) 40 MG tablet TAKE 1 TABLET(40 MG) BY MOUTH DAILY   metFORMIN (GLUCOPHAGE) 500 MG tablet TAKE 2 TABLETS(1000 MG) BY MOUTH TWICE DAILY WITH A MEAL   metoprolol tartrate (LOPRESSOR) 100 MG tablet TAKE 1 TABLET BY MOUTH IN THE MORNING, AT NOON AND AT BEDTIME   rosuvastatin (CRESTOR) 20 MG tablet Take 1 tablet (20 mg total) by mouth daily. Please make overdue appt with Dr. VaIrish Lackefore anymore refills. Thank you 1st attempt   benzonatate (TESSALON) 200 MG capsule Take 1 capsule (200 mg total) by mouth 2 (two) times daily as needed for cough. (Patient not taking: Reported on 07/06/2021)   blood glucose meter kit and supplies KIT Dispense based on patient and insurance preference. Use up to four times daily as directed. (FOR ICD-9 250.00, 250.01).   blood glucose meter kit and supplies Relion Prime or Dispense other brand based on patient and insurance preference. Use up to four times daily as directed. (FOR  ICD-9 250.00, 250.01).   ticagrelor (BRILINTA) 60 MG TABS tablet Take 1 tablet (60 mg total) by mouth 2 (two) times daily. (Patient not taking: Reported on 07/06/2021)   No current facility-administered medications for this visit. (Other)   REVIEW OF SYSTEMS: ROS   Positive for: Endocrine, Cardiovascular, Eyes Negative for: Constitutional, Gastrointestinal, Neurological, Skin, Genitourinary, Musculoskeletal, HENT, Respiratory, Psychiatric, Allergic/Imm, Heme/Lymph Last edited by Leonie Douglas, COA on 07/06/2021  8:27 AM.     ALLERGIES No Known Allergies  PAST MEDICAL HISTORY Past Medical History:  Diagnosis Date   Hyperglycemia     Hyperlipidemia    Hypertension    Past Surgical History:  Procedure Laterality Date   CESAREAN SECTION     CORONARY STENT INTERVENTION N/A 09/16/2018   Procedure: CORONARY STENT INTERVENTION;  Surgeon: Jettie Booze, MD;  Location: [facility];  Service: Cardiovascular;  Laterality: N/A;   LEFT HEART CATH AND CORONARY ANGIOGRAPHY N/A 09/16/2018   Procedure: LEFT HEART CATH AND CORONARY ANGIOGRAPHY;  Surgeon: Jettie Booze, MD;  Location: [facility];  Service: Cardiovascular;  Laterality: N/A;    FAMILY HISTORY Family History  Problem Relation Age of Onset   Diabetes Mother    Diabetes Sister    Sickle cell anemia Paternal Aunt    Heart disease Maternal Grandmother        heart enlargement   Diabetes Other        1st degree relative    SOCIAL HISTORY Social History   Tobacco Use   Smoking status: Never   Smokeless tobacco: Never  Vaping Use   Vaping Use: Never used  Substance Use Topics   Alcohol use: Yes    Comment: twice montly   Drug use: No       OPHTHALMIC EXAM:  Base Eye Exam     Visual Acuity (Snellen - Linear)       Right Left   Dist [place] 20/40 20/60 +2   Dist ph [place] 20/25 +2 20/40         Tonometry (Tonopen, 8:39 AM)       Right Left   Pressure 16 13         Pupils       Dark Light Shape React APD   Right 3 2 Round Brisk None   Left 3 2 Round Brisk None         Visual Fields (Counting fingers)       Left Right    Full Full         Extraocular Movement       Right Left    Full Full         Neuro/Psych     Oriented x3: Yes   Mood/Affect: Normal         Dilation     Both eyes: 1.0% Mydriacyl, 2.5% Phenylephrine @ 8:39 AM           Slit Lamp and Fundus Exam     External Exam       Right Left   External Normal Normal         Slit Lamp Exam       Right Left   Lids/Lashes Normal Normal   Conjunctiva/Sclera mild melanosis mild melanosis   Cornea trace PEE trace PEE   Anterior  Chamber Deep and quiet Deep and quiet   Iris Round and dilated Round and dilated   Lens 1-2+ Nuclear sclerosis, 1-2+ Cortical cataract 1-2+ Nuclear sclerosis, 1-2+  Cortical cataract   Anterior Vitreous Vitreous syneresis Vitreous syneresis         Fundus Exam       Right Left   Disc Pink and Sharp, +cupping Inferior hyperemia, +heme, +cupping, Thin supeior rim   C/D Ratio 0.7 0.75   Macula Flat, Good foveal reflex, no heme Blunted foveal reflex, central CME, confluent IRH inferior macula respecting horizontal raphe   Vessels mild attenuation, Tortuous attenuated, Tortuous, inferior HRVO   Periphery Attached, no heme, mild WWOP Attached, inferior DBH, mild temporal WWOP           Refraction     Manifest Refraction       Sphere Cylinder Axis Dist VA   Right -1.00 +0.50 165 20/20   Left -0.50 +0.50 045 NI           IMAGING AND PROCEDURES  Imaging and Procedures for 07/06/2021  OCT, Retina - OU - Both Eyes       Right Eye Quality was good. Central Foveal Thickness: 269. Progression has no prior data. Findings include normal foveal contour, no IRF, no SRF, vitreomacular adhesion .   Left Eye Quality was good. Central Foveal Thickness: 508. Progression has no prior data. Findings include abnormal foveal contour, intraretinal fluid, subretinal fluid, intraretinal hyper-reflective material.   Notes *Images captured and stored on drive  Diagnosis / Impression:  OD: NFP, no IRF/SRF OS: inferior HRVO with central CME, inferior IRHM  Clinical management:  See below  Abbreviations: NFP - Normal foveal profile. CME - cystoid macular edema. PED - pigment epithelial detachment. IRF - intraretinal fluid. SRF - subretinal fluid. EZ - ellipsoid zone. ERM - epiretinal membrane. ORA - outer retinal atrophy. ORT - outer retinal tubulation. SRHM - subretinal hyper-reflective material. IRHM - intraretinal hyper-reflective material      Intravitreal Injection, Pharmacologic Agent  - OS - Left Eye       Time Out 07/06/2021. 9:58 AM. Confirmed correct patient, procedure, site, and patient consented.   Anesthesia Topical anesthesia was used. Anesthetic medications included Lidocaine 2%, Proparacaine 0.5%.   Procedure Preparation included 5% betadine to ocular surface, eyelid speculum. A (32g) needle was used.   Injection: 1.25 mg Bevacizumab 1.38m/0.05ml   Route: Intravitreal, Site: Left Eye   NDC:: 46659-935-70 Lot: [id number], Expiration date: 08/10/2021, Waste: 0.05 mL   Post-op Post injection exam found visual acuity of at least counting fingers. The patient tolerated the procedure well. There were no complications. The patient received written and verbal post procedure care education.            ASSESSMENT/PLAN:    ICD-10-CM   1. Branch retinal vein occlusion of left eye with macular edema  H34.8320 Intravitreal Injection, Pharmacologic Agent - OS - Left Eye    Bevacizumab (AVASTIN) SOLN 1.25 mg    2. Retinal edema  H35.81 OCT, Retina - OU - Both Eyes    3. Essential hypertension  I10     4. Hypertensive retinopathy of both eyes  H35.033     5. Diabetes mellitus type 2 without retinopathy ([place]  E11.9     6. Combined forms of age-related cataract of both eyes  H25.813      1,2. Inferior HRVO w/ CME, OS  - pt reports onset of decreased vision OS around mid Nov 2022 -- had just recently stopped ASA and decreased Brillinta dosage due to 3hr nose-bleed  - history of +Lupus anticoagulant and +ANA  - The natural history of retinal vein occlusion and  macular edema and treatment options including observation, laser photocoagulation, and intravitreal antiVEGF injection with Avastin and Lucentis and Eylea and intravitreal injection of steroids with triamcinolone and Ozurdex and the complications of these procedures including loss of vision, infection, cataract, glaucoma, and retinal detachment were discussed with patient.  - Specifically discussed findings  from [place] study regarding patient stabilization with anti-VEGF agents and increased potential for visual improvements.  Also discussed need for frequent follow up and potentially multiple injections given the chronic nature of the disease process  - recommend IVA OS #1 today, 11.30.2022  - pt wishes to proceed with injection  - RBA of procedure discussed, questions answered - informed consent obtained and signed  - see procedure note - f/u 4 wks -- DFE/OCT, possible injection  3,4. Hypertensive retinopathy OU - discussed importance of tight BP control - monitor  5. Diabetes mellitus, type 2 without retinopathy - The incidence, risk factors for progression, natural history and treatment options for diabetic retinopathy  were discussed with patient.   - The need for close monitoring of blood glucose, blood pressure, and serum lipids, avoiding cigarette or any type of tobacco, and the need for long term follow up was also discussed with patient. - although no retinopathy, history of DM may have contributed to HRVO  6. Mixed Cataract OU - The symptoms of cataract, surgical options, and treatments and risks were discussed with patient. - discussed diagnosis and progression - not yet visually significant - monitor for now  Ophthalmic Meds Ordered this visit:  Meds ordered this encounter  Medications   Bevacizumab (AVASTIN) SOLN 1.25 mg     Return in about 4 weeks (around 08/03/2021) for f/u HRVO OS, DFE, OCT.  There are no Patient Instructions on file for this visit.   Explained the diagnoses, plan, and follow up with the patient and they expressed understanding.  Patient expressed understanding of the importance of proper follow up care.   This document serves as a record of services personally performed by Gardiner Sleeper, MD, PhD. It was created on their behalf by San Jetty. Owens Shark, OA an ophthalmic technician. The creation of this record is the provider's dictation and/or  activities during the visit.    Electronically signed by: San Jetty. Owens Shark, [place] 11.30.2022 12:16 PM   Gardiner Sleeper, M.D., Ph.D. Diseases & Surgery of the Retina and Vitreous [facility]  I have reviewed the above documentation for accuracy and completeness, and I agree with the above. Gardiner Sleeper, M.D., Ph.D. 07/06/21 12:16 PM   Abbreviations: M myopia (nearsighted); A astigmatism; H hyperopia (farsighted); P presbyopia; Mrx spectacle prescription;  CTL contact lenses; OD right eye; OS left eye; OU both eyes  XT exotropia; ET esotropia; PEK punctate epithelial keratitis; PEE punctate epithelial erosions; DES dry eye syndrome; MGD meibomian gland dysfunction; ATs artificial tears; PFAT's preservative free artificial tears; [place] nuclear sclerotic cataract; PSC posterior subcapsular cataract; ERM epi-retinal membrane; PVD posterior vitreous detachment; RD retinal detachment; DM diabetes mellitus; DR diabetic retinopathy; NPDR non-proliferative diabetic retinopathy; PDR proliferative diabetic retinopathy; CSME clinically significant macular edema; DME diabetic macular edema; dbh dot blot hemorrhages; CWS cotton wool spot; POAG primary open angle glaucoma; C/D cup-to-disc ratio; HVF humphrey visual field; GVF goldmann visual field; OCT optical coherence tomography; IOP intraocular pressure; BRVO Branch retinal vein occlusion; CRVO central retinal vein occlusion; CRAO central retinal artery occlusion; BRAO branch retinal artery occlusion; RT retinal tear; SB scleral buckle; PPV pars plana vitrectomy;  VH Vitreous hemorrhage; PRP panretinal laser photocoagulation; IVK intravitreal kenalog; VMT vitreomacular traction; MH Macular hole;  NVD neovascularization of the disc; NVE neovascularization elsewhere; AREDS age related eye disease study; ARMD age related macular degeneration; POAG primary open angle glaucoma; EBMD epithelial/anterior basement membrane dystrophy; ACIOL anterior  chamber intraocular lens; IOL intraocular lens; PCIOL posterior chamber intraocular lens; Phaco/IOL phacoemulsification with intraocular lens placement; [place] photorefractive keratectomy; LASIK laser assisted in situ keratomileusis; HTN hypertension; DM diabetes mellitus; COPD chronic obstructive pulmonary disease

## 2021-07-13 ENCOUNTER — Encounter: Payer: Self-pay | Admitting: Interventional Cardiology

## 2021-07-13 ENCOUNTER — Encounter: Payer: Self-pay | Admitting: Family Medicine

## 2021-07-18 ENCOUNTER — Encounter: Payer: Self-pay | Admitting: Internal Medicine

## 2021-07-18 ENCOUNTER — Telehealth (INDEPENDENT_AMBULATORY_CARE_PROVIDER_SITE_OTHER): Payer: 59 | Admitting: Internal Medicine

## 2021-07-18 ENCOUNTER — Other Ambulatory Visit: Payer: Self-pay

## 2021-07-18 VITALS — BP 143/87

## 2021-07-18 DIAGNOSIS — E1110 Type 2 diabetes mellitus with ketoacidosis without coma: Secondary | ICD-10-CM

## 2021-07-18 DIAGNOSIS — I10 Essential (primary) hypertension: Secondary | ICD-10-CM | POA: Diagnosis not present

## 2021-07-18 DIAGNOSIS — E785 Hyperlipidemia, unspecified: Secondary | ICD-10-CM

## 2021-07-18 DIAGNOSIS — I2510 Atherosclerotic heart disease of native coronary artery without angina pectoris: Secondary | ICD-10-CM

## 2021-07-18 NOTE — Progress Notes (Signed)
Virtual Visit via Video Note   This visit type was conducted due to national recommendations for restrictions regarding the COVID-19 Pandemic (e.g. social distancing) in an effort to limit this patient's exposure and mitigate transmission in our community.  Due to her co-morbid illnesses, this patient is at least at moderate risk for complications without adequate follow up.  This format is felt to be most appropriate for this patient at this time.  All issues noted in this document were discussed and addressed.  A limited physical exam was performed with this format.  Please refer to the patient's chart for her consent to telehealth for [facility]      Date:  07/18/2021   ID:  Danielle Flores, DOB 1966/07/31, MRN [id number] The patient was identified using 2 identifiers.  Evaluation Performed:  New Patient Evaluation  Patient Location:  [address] [place] [address]  Provider location:   [address] [place], [place] [address]  PCP:  Carollee Herter, Alferd Apa, DO  Cardiologist:  Larae Grooms, MD Electrophysiologist:  None   Chief Complaint: Statin intolerance  History of Present Illness:    Danielle Flores is a 55 y.o. female who presents via audio/video conferencing for a telehealth visit today.  This is a pleasant 55 year old female kindly referred by Dr. Etter Sjogren for statin intolerance.  [name] history of coronary artery disease and had PCI to the posterior descending artery in 2020.  She had some residual coronary disease.  Other risk factors include diabetes and hypertension.  Her cholesterol was not at target when she was seen by her PCP and therefore referred to the lipid clinic.  Subsequently she had been seen by Dr. Irish Lack who noted she was not able to tolerate atorvastatin and switched her to rosuvastatin 20 mg daily and ezetimibe 10 mg daily.  She has been taking half of the rosuvastatin or 10 mg daily in addition to the ezetimibe  10 mg daily.  She says this is really well-tolerated.  Recent cholesterol on November 1 is excellent which showed total cholesterol 100, triglycerides 96, HDL 32 and LDL 48.  The patient does not have symptoms concerning for COVID-19 infection (fever, chills, cough, or new SHORTNESS OF BREATH).    Prior CV studies:   The following studies were reviewed today:  Chart reviewed, lab work  PMHx:  Past Medical History:  Diagnosis Date   Hyperglycemia    Hyperlipidemia    Hypertension     Past Surgical History:  Procedure Laterality Date   CESAREAN SECTION     CORONARY STENT INTERVENTION N/A 09/16/2018   Procedure: CORONARY STENT INTERVENTION;  Surgeon: Jettie Booze, MD;  Location: [facility];  Service: Cardiovascular;  Laterality: N/A;   LEFT HEART CATH AND CORONARY ANGIOGRAPHY N/A 09/16/2018   Procedure: LEFT HEART CATH AND CORONARY ANGIOGRAPHY;  Surgeon: Jettie Booze, MD;  Location: [facility];  Service: Cardiovascular;  Laterality: N/A;    FAMHx:  Family History  Problem Relation Age of Onset   Diabetes Mother    Diabetes Sister    Sickle cell anemia Paternal Aunt    Heart disease Maternal Grandmother        heart enlargement   Diabetes Other        1st degree relative    SOCHx:   reports that she has never smoked. She has never used smokeless tobacco. She reports current alcohol use. She reports that she does not use drugs.  ALLERGIES:  Allergies  Allergen Reactions   Atorvastatin Other (See Comments)    Myalgias    MEDS:  Current Meds  Medication Sig   amLODipine (NORVASC) 10 MG tablet TAKE 1 TABLET(10 MG) BY MOUTH DAILY   benzonatate (TESSALON) 200 MG capsule Take 1 capsule (200 mg total) by mouth 2 (two) times daily as needed for cough.   blood glucose meter kit and supplies KIT Dispense based on patient and insurance preference. Use up to four times daily as directed. (FOR ICD-9 250.00, 250.01).   blood glucose meter kit and  supplies Relion Prime or Dispense other brand based on patient and insurance preference. Use up to four times daily as directed. (FOR ICD-9 250.00, 250.01).   dapagliflozin propanediol (FARXIGA) 10 MG TABS tablet Take 1 tablet (10 mg total) by mouth daily before breakfast.   ezetimibe (ZETIA) 10 MG tablet Take 1 tablet (10 mg total) by mouth daily.   insulin isophane & regular human (NOVOLIN 70/30 FLEXPEN) (70-30) 100 UNIT/ML KwikPen Inject 22 Units into the skin in the morning and at bedtime.   lisinopril (ZESTRIL) 40 MG tablet TAKE 1 TABLET(40 MG) BY MOUTH DAILY   metFORMIN (GLUCOPHAGE) 500 MG tablet TAKE 2 TABLETS(1000 MG) BY MOUTH TWICE DAILY WITH A MEAL   metoprolol tartrate (LOPRESSOR) 100 MG tablet TAKE 1 TABLET BY MOUTH IN THE MORNING, AT NOON AND AT BEDTIME   rosuvastatin (CRESTOR) 20 MG tablet Take 1 tablet (20 mg total) by mouth daily. Please make overdue appt with Dr. Irish Lack before anymore refills. Thank you 1st attempt   ticagrelor (BRILINTA) 60 MG TABS tablet Take 1 tablet (60 mg total) by mouth 2 (two) times daily.     ROS: Pertinent items noted in HPI and remainder of comprehensive ROS otherwise negative.  Labs/Other Tests and Data Reviewed:    Recent Labs: 03/04/2021: Hemoglobin 12.8; Platelets 271.0 06/07/2021: ALT 43; BUN 10; Creatinine, Ser 1.11; Potassium 4.0; Sodium 139   Recent Lipid Panel Lab Results  Component Value Date/Time   CHOL 100 06/07/2021 12:58 PM   CHOL 124 10/04/2020 08:54 AM   TRIG 96.0 06/07/2021 12:58 PM   HDL 32.20 (L) 06/07/2021 12:58 PM   HDL 42 10/04/2020 08:54 AM   CHOLHDL 3 06/07/2021 12:58 PM   LDLCALC 48 06/07/2021 12:58 PM   LDLCALC 62 10/04/2020 08:54 AM   LDLDIRECT 131.0 01/08/2020 01:42 PM    Wt Readings from Last 3 Encounters:  05/02/21 214 lb 12.8 oz (97.4 kg)  03/04/21 218 lb (98.9 kg)  04/09/20 204 lb 9.6 oz (92.8 kg)     Exam:    Vital Signs:  BP (!) 143/87   LMP  (LMP Unknown)    General appearance: alert and no  distress Lungs: No visual respiratory difficulty Abdomen: Mildly overweight Extremities: extremities normal, atraumatic, no cyanosis or edema Skin: Skin color, texture, turgor normal. No rashes or lesions Neurologic: Grossly normal Psych: Pleasant  ASSESSMENT & PLAN:    Mixed dyslipidemia, goal LDL less than 70 Coronary artery disease status post PCI to the PDA in 2020 Hypertension Atorvastatin intolerance  Ms. Charna Archer was intolerant to atorvastatin, but is tolerating 10 mg rosuvastatin and 10 mg of ezetimibe daily.  She is now at goal LDL on this combination.  I would continue it as she is tolerating well and is reach targets.  In fact she reaches the less than 55 LDL target for "very high risk individuals".  She can follow-up with me on an as-needed basis and continue  to follow with Dr. Emeterio Reeve and her primary care provider.  COVID-19 Education: The signs and symptoms of COVID-19 were discussed with the patient and how to seek care for testing (follow up with PCP or arrange E-visit).  The importance of social distancing was discussed today.  Patient Risk:   After full review of this patients clinical status, I feel that they are at least moderate risk at this time.  Time:   Today, I have spent 25 minutes with the patient with telehealth technology discussing dyslipidemia.     Medication Adjustments/Labs and Tests Ordered: Current medicines are reviewed at length with the patient today.  Concerns regarding medicines are outlined above.   Tests Ordered: No orders of the defined types were placed in this encounter.   Medication Changes: No orders of the defined types were placed in this encounter.   Disposition:  prn  Pixie Casino, MD, FACC, [place] Director of the Advanced Lipid Disorders &  Cardiovascular Risk Reduction Clinic Diplomate of the American Board of Clinical Lipidology Attending Cardiologist  Direct Dial: [phone]   Fax: [phone]  Website:  [email or web address]  Pixie Casino, MD  07/18/2021 8:35 AM

## 2021-07-18 NOTE — Patient Instructions (Signed)
Medication Instructions:  NO CHANGES  *If you need a refill on your cardiac medications before your next appointment, please call your pharmacy*   Follow-Up: At [facility], you and your health needs are our priority.  As part of our continuing mission to provide you with exceptional heart care, we have created designated Provider Care Teams.  These Care Teams include your primary Cardiologist (physician) and Advanced Practice Providers (APPs -  Physician Assistants and Nurse Practitioners) who all work together to provide you with the care you need, when you need it.  We recommend signing up for the patient portal called "MyChart".  Sign up information is provided on this After Visit Summary.  MyChart is used to connect with patients for Virtual Visits (Telemedicine).  Patients are able to view lab/test results, encounter notes, upcoming appointments, etc.  Non-urgent messages can be sent to your provider as well.   To learn more about what you can do with MyChart, go to [email or web address].    Your next appointment:   AS NEEDED with Dr. Rennis Golden for lipid management

## 2021-07-25 ENCOUNTER — Encounter: Payer: Self-pay | Admitting: Interventional Cardiology

## 2021-07-25 ENCOUNTER — Encounter: Payer: Self-pay | Admitting: Family Medicine

## 2021-07-25 MED ORDER — TICAGRELOR 60 MG PO TABS: 60.0000 mg | ORAL_TABLET | Freq: Two times a day (BID) | ORAL | 1 refills | Status: DC

## 2021-07-25 MED ORDER — DAPAGLIFLOZIN PROPANEDIOL 10 MG PO TABS: 10.0000 mg | ORAL_TABLET | Freq: Every day | ORAL | 2 refills | Status: DC

## 2021-07-26 ENCOUNTER — Encounter: Payer: Self-pay | Admitting: Interventional Cardiology

## 2021-07-28 ENCOUNTER — Encounter: Payer: Self-pay | Admitting: Interventional Cardiology

## 2021-07-29 NOTE — Progress Notes (Signed)
[facility] Note  08/03/2021     CHIEF COMPLAINT Patient presents for Retina Follow Up    HISTORY OF PRESENT ILLNESS: Danielle Flores is a 55 y.o. female who presents to the clinic today for:   HPI     Retina Follow Up   Patient presents with  Other.  In left eye.  Duration of 4 weeks.  Since onset it is gradually improving.  I, the attending physician,  performed the HPI with the patient and updated documentation appropriately.        Comments   4 week follow up HRVO OS- OS is doing a little better.  BS 125 about a month ago A1C 8.2      Last edited by Bernarda Caffey, MD on 08/03/2021 12:31 PM.      Referring physician: Carollee Herter, Yvonne R, DO [address] [place],  [place] [address]  HISTORICAL INFORMATION:   Selected notes from the MEDICAL RECORD NUMBER Referred by Dr. Truman Hayward:  Ocular Hx- PMH-    CURRENT MEDICATIONS: No current outpatient medications on file. (Ophthalmic Drugs)   No current facility-administered medications for this visit. (Ophthalmic Drugs)   Current Outpatient Medications (Other)  Medication Sig   amLODipine (NORVASC) 10 MG tablet TAKE 1 TABLET(10 MG) BY MOUTH DAILY   dapagliflozin propanediol (FARXIGA) 10 MG TABS tablet Take 1 tablet (10 mg total) by mouth daily before breakfast.   ezetimibe (ZETIA) 10 MG tablet Take 1 tablet (10 mg total) by mouth daily.   insulin isophane & regular human (NOVOLIN 70/30 FLEXPEN) (70-30) 100 UNIT/ML KwikPen Inject 22 Units into the skin in the morning and at bedtime.   lisinopril (ZESTRIL) 40 MG tablet TAKE 1 TABLET(40 MG) BY MOUTH DAILY   metFORMIN (GLUCOPHAGE) 500 MG tablet TAKE 2 TABLETS(1000 MG) BY MOUTH TWICE DAILY WITH A MEAL   metoprolol tartrate (LOPRESSOR) 100 MG tablet TAKE 1 TABLET BY MOUTH IN THE MORNING, AT NOON AND AT BEDTIME   rosuvastatin (CRESTOR) 20 MG tablet Take 1 tablet (20 mg total) by mouth daily. Please make overdue appt with Dr.  Irish Lack before anymore refills. Thank you 1st attempt   ticagrelor (BRILINTA) 60 MG TABS tablet Take 1 tablet (60 mg total) by mouth 2 (two) times daily. Please keep upcoming appt in February 2023 with Dr. Irish Lack before anymore refills. Thank you   benzonatate (TESSALON) 200 MG capsule Take 1 capsule (200 mg total) by mouth 2 (two) times daily as needed for cough. (Patient not taking: Reported on 08/03/2021)   blood glucose meter kit and supplies KIT Dispense based on patient and insurance preference. Use up to four times daily as directed. (FOR ICD-9 250.00, 250.01).   blood glucose meter kit and supplies Relion Prime or Dispense other brand based on patient and insurance preference. Use up to four times daily as directed. (FOR ICD-9 250.00, 250.01).   No current facility-administered medications for this visit. (Other)   REVIEW OF SYSTEMS: ROS   Positive for: Endocrine, Cardiovascular, Eyes Negative for: Constitutional, Gastrointestinal, Neurological, Skin, Genitourinary, Musculoskeletal, HENT, Respiratory, Psychiatric, Allergic/Imm, Heme/Lymph Last edited by Leonie Douglas, COA on 08/03/2021  8:06 AM.      ALLERGIES Allergies  Allergen Reactions   Atorvastatin Other (See Comments)    Myalgias    PAST MEDICAL HISTORY Past Medical History:  Diagnosis Date   Hyperglycemia    Hyperlipidemia    Hypertension    Past Surgical History:  Procedure Laterality Date  CESAREAN SECTION     CORONARY STENT INTERVENTION N/A 09/16/2018   Procedure: CORONARY STENT INTERVENTION;  Surgeon: Jettie Booze, MD;  Location: [facility];  Service: Cardiovascular;  Laterality: N/A;   LEFT HEART CATH AND CORONARY ANGIOGRAPHY N/A 09/16/2018   Procedure: LEFT HEART CATH AND CORONARY ANGIOGRAPHY;  Surgeon: Jettie Booze, MD;  Location: [facility];  Service: Cardiovascular;  Laterality: N/A;    FAMILY HISTORY Family History  Problem Relation Age of Onset   Diabetes Mother     Diabetes Sister    Sickle cell anemia Paternal Aunt    Heart disease Maternal Grandmother        heart enlargement   Diabetes Other        1st degree relative    SOCIAL HISTORY Social History   Tobacco Use   Smoking status: Never   Smokeless tobacco: Never  Vaping Use   Vaping Use: Never used  Substance Use Topics   Alcohol use: Yes    Comment: twice montly   Drug use: No       OPHTHALMIC EXAM:  Base Eye Exam     Visual Acuity (Snellen - Linear)       Right Left   Dist [place] 20/30 -1 20/40 +1   Dist ph [place] 20/25 +1 20/25 +1         Tonometry (Tonopen, 8:13 AM)       Right Left   Pressure 21 18         Pupils       Dark Light Shape React APD   Right 3 2 Round Minimal None   Left 3 2 Round Minimal None         Visual Fields (Counting fingers)       Left Right    Full Full         Extraocular Movement       Right Left    Full Full         Neuro/Psych     Oriented x3: Yes   Mood/Affect: Normal         Dilation     Both eyes: 1.0% Mydriacyl, 2.5% Phenylephrine @ 8:13 AM           Slit Lamp and Fundus Exam     External Exam       Right Left   External Normal Normal         Slit Lamp Exam       Right Left   Lids/Lashes Normal Normal   Conjunctiva/Sclera mild melanosis mild melanosis   Cornea trace PEE trace PEE   Anterior Chamber Deep and quiet Deep and quiet   Iris Round and dilated Round and dilated   Lens 1-2+ Nuclear sclerosis, 1-2+ Cortical cataract 1-2+ Nuclear sclerosis, 1-2+ Cortical cataract   Anterior Vitreous Vitreous syneresis Vitreous syneresis         Fundus Exam       Right Left   Disc Pink and Sharp, +cupping +heme - improved, +cupping, Thin supeior rim, Pink and Sharp   C/D Ratio 0.7 0.75   Macula Flat, Good foveal reflex, no heme Blunted foveal reflex, central CME - improved, confluent IRH inferior macula respecting horizontal raphe -- vastly improved   Vessels mild attenuation, Tortuous  attenuated, Tortuous, inferior HRVO   Periphery Attached, no heme, mild WWOP Attached, inferior DBH, mild temporal WWOP           IMAGING AND PROCEDURES  Imaging and Procedures for 08/03/2021  OCT, Retina - OU - Both Eyes       Right Eye Quality was good. Central Foveal Thickness: 274. Progression has been stable. Findings include normal foveal contour, no IRF, no SRF, vitreomacular adhesion .   Left Eye Quality was good. Central Foveal Thickness: 285. Progression has improved. Findings include intraretinal hyper-reflective material, normal foveal contour, no IRF, no SRF (Interval improvement in IRF/SRF and foveal contour, irregular lamination inferior macula).   Notes *Images captured and stored on drive  Diagnosis / Impression:  OD: NFP, no IRF/SRF OS: Interval improvement in IRF and foveal contour, irregular lamination inferior macula  Clinical management:  See below  Abbreviations: NFP - Normal foveal profile. CME - cystoid macular edema. PED - pigment epithelial detachment. IRF - intraretinal fluid. SRF - subretinal fluid. EZ - ellipsoid zone. ERM - epiretinal membrane. ORA - outer retinal atrophy. ORT - outer retinal tubulation. SRHM - subretinal hyper-reflective material. IRHM - intraretinal hyper-reflective material      Intravitreal Injection, Pharmacologic Agent - OS - Left Eye       Time Out 08/03/2021. 8:59 AM. Confirmed correct patient, procedure, site, and patient consented.   Anesthesia Topical anesthesia was used. Anesthetic medications included Lidocaine 2%, Proparacaine 0.5%.   Procedure Preparation included 5% betadine to ocular surface, eyelid speculum. A supplied needle was used.   Injection: 1.25 mg Bevacizumab 1.66m/0.05ml   Route: Intravitreal, Site: Left Eye   NDC:: 63817-711-65 Lot: [id number] , Expiration date: 09/13/2021, Waste: 0 mL   Post-op Post injection exam found visual acuity of at least counting fingers. The patient tolerated the  procedure well. There were no complications. The patient received written and verbal post procedure care education. Post injection medications were not given.             ASSESSMENT/PLAN:    ICD-10-CM   1. Branch retinal vein occlusion of left eye with macular edema  H34.8320 OCT, Retina - OU - Both Eyes    Intravitreal Injection, Pharmacologic Agent - OS - Left Eye    Bevacizumab (AVASTIN) SOLN 1.25 mg    2. Essential hypertension  I10     3. Hypertensive retinopathy of both eyes  H35.033     4. Diabetes mellitus type 2 without retinopathy ([place]  E11.9     5. Combined forms of age-related cataract of both eyes  H25.813       1. Inferior HRVO w/ CME, OS  - pt reports onset of decreased vision OS around mid Nov 2022 -- had just recently stopped ASA and decreased Brillinta dosage due to 3hr nose-bleed  - history of +Lupus anticoagulant and +ANA  - s/p IVA OS #1 (11.30.22)  - BCVA 20/25 from 20/40 OS - OCT shows Interval improvement in IRF and foveal contour, irregular lamination inferior macula  - recommend IVA OS #2 today, 12.28.22  - pt wishes to proceed with injection  - RBA of procedure discussed, questions answered - informed consent obtained and signed  - see procedure note - f/u 4 wks -- DFE/OCT, possible injection  2,3. Hypertensive retinopathy OU - discussed importance of tight BP control - monitor  4. Diabetes mellitus, type 2 without retinopathy - The incidence, risk factors for progression, natural history and treatment options for diabetic retinopathy  were discussed with patient.   - The need for close monitoring of blood glucose, blood pressure, and serum lipids, avoiding cigarette or any type of tobacco, and the need for long term follow up  was also discussed with patient. - although no retinopathy, history of DM may have contributed to HRVO  5. Mixed Cataract OU - The symptoms of cataract, surgical options, and treatments and risks were discussed with  patient. - discussed diagnosis and progression - not yet visually significant - monitor for now  Ophthalmic Meds Ordered this visit:  Meds ordered this encounter  Medications   Bevacizumab (AVASTIN) SOLN 1.25 mg     Return in about 4 weeks (around 08/31/2021) for f/u inferior HRVO OS, DFE, OCT.  There are no Patient Instructions on file for this visit.   Explained the diagnoses, plan, and follow up with the patient and they expressed understanding.  Patient expressed understanding of the importance of proper follow up care.   This document serves as a record of services personally performed by Gardiner Sleeper, MD, PhD. It was created on their behalf by San Jetty. Owens Shark, OA an ophthalmic technician. The creation of this record is the provider's dictation and/or activities during the visit.    Electronically signed by: San Jetty. Owens Shark, [place] 11.30.2022 12:36 PM  Gardiner Sleeper, M.D., Ph.D. Diseases & Surgery of the Retina and Vitreous [facility]  I have reviewed the above documentation for accuracy and completeness, and I agree with the above. Gardiner Sleeper, M.D., Ph.D. 08/03/21 12:36 PM   Abbreviations: M myopia (nearsighted); A astigmatism; H hyperopia (farsighted); P presbyopia; Mrx spectacle prescription;  CTL contact lenses; OD right eye; OS left eye; OU both eyes  XT exotropia; ET esotropia; PEK punctate epithelial keratitis; PEE punctate epithelial erosions; DES dry eye syndrome; MGD meibomian gland dysfunction; ATs artificial tears; PFAT's preservative free artificial tears; [place] nuclear sclerotic cataract; PSC posterior subcapsular cataract; ERM epi-retinal membrane; PVD posterior vitreous detachment; RD retinal detachment; DM diabetes mellitus; DR diabetic retinopathy; NPDR non-proliferative diabetic retinopathy; PDR proliferative diabetic retinopathy; CSME clinically significant macular edema; DME diabetic macular edema; dbh dot blot hemorrhages; CWS cotton  wool spot; POAG primary open angle glaucoma; C/D cup-to-disc ratio; HVF humphrey visual field; GVF goldmann visual field; OCT optical coherence tomography; IOP intraocular pressure; BRVO Branch retinal vein occlusion; CRVO central retinal vein occlusion; CRAO central retinal artery occlusion; BRAO branch retinal artery occlusion; RT retinal tear; SB scleral buckle; PPV pars plana vitrectomy; VH Vitreous hemorrhage; PRP panretinal laser photocoagulation; IVK intravitreal kenalog; VMT vitreomacular traction; MH Macular hole;  NVD neovascularization of the disc; NVE neovascularization elsewhere; AREDS age related eye disease study; ARMD age related macular degeneration; POAG primary open angle glaucoma; EBMD epithelial/anterior basement membrane dystrophy; ACIOL anterior chamber intraocular lens; IOL intraocular lens; PCIOL posterior chamber intraocular lens; Phaco/IOL phacoemulsification with intraocular lens placement; [place] photorefractive keratectomy; LASIK laser assisted in situ keratomileusis; HTN hypertension; DM diabetes mellitus; COPD chronic obstructive pulmonary disease

## 2021-08-03 ENCOUNTER — Other Ambulatory Visit: Payer: Self-pay

## 2021-08-03 ENCOUNTER — Ambulatory Visit (INDEPENDENT_AMBULATORY_CARE_PROVIDER_SITE_OTHER): Payer: 59 | Admitting: Ophthalmology

## 2021-08-03 ENCOUNTER — Encounter (INDEPENDENT_AMBULATORY_CARE_PROVIDER_SITE_OTHER): Payer: Self-pay | Admitting: Ophthalmology

## 2021-08-03 DIAGNOSIS — E119 Type 2 diabetes mellitus without complications: Secondary | ICD-10-CM

## 2021-08-03 DIAGNOSIS — H34832 Tributary (branch) retinal vein occlusion, left eye, with macular edema: Secondary | ICD-10-CM | POA: Diagnosis not present

## 2021-08-03 DIAGNOSIS — H35033 Hypertensive retinopathy, bilateral: Secondary | ICD-10-CM

## 2021-08-03 DIAGNOSIS — I10 Essential (primary) hypertension: Secondary | ICD-10-CM

## 2021-08-03 DIAGNOSIS — H25813 Combined forms of age-related cataract, bilateral: Secondary | ICD-10-CM | POA: Diagnosis not present

## 2021-08-03 MED ORDER — BEVACIZUMAB CHEMO INJECTION 1.25MG/0.05ML SYRINGE FOR KALEIDOSCOPE
1.2500 mg | INTRAVITREAL | Status: AC | PRN
  Administered 2021-08-03: 13:00:00 1.25 mg via INTRAVITREAL

## 2021-08-13 ENCOUNTER — Encounter: Payer: Self-pay | Admitting: Family Medicine

## 2021-08-13 DIAGNOSIS — E1165 Type 2 diabetes mellitus with hyperglycemia: Secondary | ICD-10-CM

## 2021-08-15 MED ORDER — NOVOLIN 70/30 FLEXPEN (70-30) 100 UNIT/ML ~~LOC~~ SUPN: 22.0000 [IU] | PEN_INJECTOR | Freq: Two times a day (BID) | SUBCUTANEOUS | 3 refills | Status: DC

## 2021-08-15 NOTE — Addendum Note (Signed)
Addended byConrad  D on: 08/15/2021 04:20 PM   Modules accepted: Orders

## 2021-08-18 ENCOUNTER — Telehealth: Payer: Self-pay | Admitting: *Deleted

## 2021-08-18 NOTE — Telephone Encounter (Signed)
° °  Pre-operative Risk Assessment    Patient Name: Danielle Flores  DOB: Oct 10, 1965 MRN: [id number]      Request for Surgical Clearance    Procedure:   COLONOSCOPY  COLON CANCER SCREENING  Date of Surgery:  Clearance 12/01/21                                 Surgeon:  DR. [facility] Surgeon's Group or Practice Name:  [facility] Phone number:  [phone] Fax number:  [phone]   Type of Clearance Requested:   - Medical  - Pharmacy:  Hold Ticagrelor (Brilinta) x 5 DAYS PRIOR TO PROCEDURE   Type of Anesthesia:   PROPOFOL   Additional requests/questions:    Elpidio Anis   08/18/2021, 3:27 PM

## 2021-08-19 NOTE — Telephone Encounter (Signed)
Patient will need appointment to be cleared for colonoscopy.  She has not been seen by Dr. Eldridge Dace in 1.5 years.  Although she was recently seen via virtual visit by Dr. Rennis Golden, however the visit was only for lipid control and not a general cardiology visit.  She is scheduled to see Dr. Eldridge Dace 2/28, cardiac clearance can be addressed at that time.  Please inform the requesting provider's office.

## 2021-08-19 NOTE — Telephone Encounter (Signed)
She is scheduled to see Dr. Irish Lack 2/28, cardiac clearance can be addressed at that time.  Will route back to the requesting surgeon's office to make them aware.

## 2021-08-29 NOTE — Progress Notes (Shared)
[facility] Note  08/31/2021     CHIEF COMPLAINT Patient presents for No chief complaint on file.    HISTORY OF PRESENT ILLNESS: Danielle Flores is a 56 y.o. female who presents to the clinic today for:    Pt states about 2 weeks she started seeing grey spots in her vision, she went to see Dr. Delman Cheadle who told her a vessel ruptured in her left eye, pt has high blood pressure and takes 3 medications, she states she checks her BP at home, but has checked it in a couple weeks, last time she checked it was 125/82, pt sees a rheumatologist who discovered she has ANA, she does not have hx of blood clots, but has been taking a blood thinner (Brilinta) since 2019, pt states right before this episode happened her dosage of Brillinta was lowered from 10m to 672mand she was taken off aspirin bc she had a nose bleed that took 3 hours to stop, pt states she is also diabetic and takes insulin and oral medication  Referring physician: LoAnn HeldDO [address] [place],  [id number] INFORMATION:   Selected notes from the MEDICAL RECORD NUMBER Referred by Dr. LETruman Hayward Ocular Hx- PMH-    CURRENT MEDICATIONS: No current outpatient medications on file. (Ophthalmic Drugs)   No current facility-administered medications for this visit. (Ophthalmic Drugs)   Current Outpatient Medications (Other)  Medication Sig   amLODipine (NORVASC) 10 MG tablet TAKE 1 TABLET(10 MG) BY MOUTH DAILY   benzonatate (TESSALON) 200 MG capsule Take 1 capsule (200 mg total) by mouth 2 (two) times daily as needed for cough. (Patient not taking: Reported on 08/03/2021)   blood glucose meter kit and supplies KIT Dispense based on patient and insurance preference. Use up to four times daily as directed. (FOR ICD-9 250.00, 250.01).   blood glucose meter kit and supplies Relion Prime or Dispense other brand based on patient and insurance preference. Use up to  four times daily as directed. (FOR ICD-9 250.00, 250.01).   dapagliflozin propanediol (FARXIGA) 10 MG TABS tablet Take 1 tablet (10 mg total) by mouth daily before breakfast.   ezetimibe (ZETIA) 10 MG tablet Take 1 tablet (10 mg total) by mouth daily.   insulin isophane & regular human KwikPen (NOVOLIN 70/30 KWIKPEN) (70-30) 100 UNIT/ML KwikPen Inject 22 Units into the skin in the morning and at bedtime.   lisinopril (ZESTRIL) 40 MG tablet TAKE 1 TABLET(40 MG) BY MOUTH DAILY   metFORMIN (GLUCOPHAGE) 500 MG tablet TAKE 2 TABLETS(1000 MG) BY MOUTH TWICE DAILY WITH A MEAL   metoprolol tartrate (LOPRESSOR) 100 MG tablet TAKE 1 TABLET BY MOUTH IN THE MORNING, AT NOON AND AT BEDTIME   rosuvastatin (CRESTOR) 20 MG tablet Take 1 tablet (20 mg total) by mouth daily. Please make overdue appt with Dr. VaIrish Lackefore anymore refills. Thank you 1st attempt   ticagrelor (BRILINTA) 60 MG TABS tablet Take 1 tablet (60 mg total) by mouth 2 (two) times daily. Please keep upcoming appt in February 2023 with Dr. VaIrish Lackefore anymore refills. Thank you   No current facility-administered medications for this visit. (Other)   REVIEW OF SYSTEMS:   ALLERGIES Allergies  Allergen Reactions   Atorvastatin Other (See Comments)    Myalgias    PAST MEDICAL HISTORY Past Medical History:  Diagnosis Date   Hyperglycemia    Hyperlipidemia    Hypertension  Past Surgical History:  Procedure Laterality Date   CESAREAN SECTION     CORONARY STENT INTERVENTION N/A 09/16/2018   Procedure: CORONARY STENT INTERVENTION;  Surgeon: Jettie Booze, MD;  Location: [facility];  Service: Cardiovascular;  Laterality: N/A;   LEFT HEART CATH AND CORONARY ANGIOGRAPHY N/A 09/16/2018   Procedure: LEFT HEART CATH AND CORONARY ANGIOGRAPHY;  Surgeon: Jettie Booze, MD;  Location: [facility];  Service: Cardiovascular;  Laterality: N/A;    FAMILY HISTORY Family History  Problem Relation Age of Onset    Diabetes Mother    Diabetes Sister    Sickle cell anemia Paternal Aunt    Heart disease Maternal Grandmother        heart enlargement   Diabetes Other        1st degree relative    SOCIAL HISTORY Social History   Tobacco Use   Smoking status: Never   Smokeless tobacco: Never  Vaping Use   Vaping Use: Never used  Substance Use Topics   Alcohol use: Yes    Comment: twice montly   Drug use: No       OPHTHALMIC EXAM:  Not recorded    IMAGING AND PROCEDURES  Imaging and Procedures for 08/31/2021          ASSESSMENT/PLAN:    ICD-10-CM   1. Branch retinal vein occlusion of left eye with macular edema  H34.8320     2. Essential hypertension  I10     3. Hypertensive retinopathy of both eyes  H35.033     4. Diabetes mellitus type 2 without retinopathy ([place])  E11.9     5. Combined forms of age-related cataract of both eyes  H25.813      1,2. Inferior HRVO w/ CME, OS  - s/p IVA OS #1 (11.30.22)  - pt reports onset of decreased vision OS around mid Nov 2022 -- had just recently stopped ASA and decreased Brillinta dosage due to 3hr nose-bleed  - history of +Lupus anticoagulant and +ANA  - recommend IVA OS #2 today, 01.25.23  - pt wishes to proceed with injection  - RBA of procedure discussed, questions answered - informed consent obtained and signed  - see procedure note - f/u 4 wks -- DFE/OCT, possible injection  3,4. Hypertensive retinopathy OU - discussed importance of tight BP control - monitor  5. Diabetes mellitus, type 2 without retinopathy - The incidence, risk factors for progression, natural history and treatment options for diabetic retinopathy  were discussed with patient.   - The need for close monitoring of blood glucose, blood pressure, and serum lipids, avoiding cigarette or any type of tobacco, and the need for long term follow up was also discussed with patient. - although no retinopathy, history of DM may have contributed to HRVO  6. Mixed  Cataract OU - The symptoms of cataract, surgical options, and treatments and risks were discussed with patient. - discussed diagnosis and progression - not yet visually significant - monitor for now  Ophthalmic Meds Ordered this visit:  No orders of the defined types were placed in this encounter.    No follow-ups on file.  There are no Patient Instructions on file for this visit.   Explained the diagnoses, plan, and follow up with the patient and they expressed understanding.  Patient expressed understanding of the importance of proper follow up care.   This document serves as a record of services personally performed by Gardiner Sleeper, MD, PhD. It was created  on their behalf by San Jetty. Owens Shark, OA an ophthalmic technician. The creation of this record is the provider's dictation and/or activities during the visit.    Electronically signed by: San Jetty. Marguerita Merles 01.23.2023 12:35 PM    Gardiner Sleeper, M.D., Ph.D. Diseases & Surgery of the Retina and Vitreous [facility]: M myopia (nearsighted); A astigmatism; H hyperopia (farsighted); P presbyopia; Mrx spectacle prescription;  CTL contact lenses; OD right eye; OS left eye; OU both eyes  XT exotropia; ET esotropia; PEK punctate epithelial keratitis; PEE punctate epithelial erosions; DES dry eye syndrome; MGD meibomian gland dysfunction; ATs artificial tears; PFAT's preservative free artificial tears; [place] nuclear sclerotic cataract; PSC posterior subcapsular cataract; ERM epi-retinal membrane; PVD posterior vitreous detachment; RD retinal detachment; DM diabetes mellitus; DR diabetic retinopathy; NPDR non-proliferative diabetic retinopathy; PDR proliferative diabetic retinopathy; CSME clinically significant macular edema; DME diabetic macular edema; dbh dot blot hemorrhages; CWS cotton wool spot; POAG primary open angle glaucoma; C/D cup-to-disc ratio; HVF humphrey visual field; GVF goldmann  visual field; OCT optical coherence tomography; IOP intraocular pressure; BRVO Branch retinal vein occlusion; CRVO central retinal vein occlusion; CRAO central retinal artery occlusion; BRAO branch retinal artery occlusion; RT retinal tear; SB scleral buckle; PPV pars plana vitrectomy; VH Vitreous hemorrhage; PRP panretinal laser photocoagulation; IVK intravitreal kenalog; VMT vitreomacular traction; MH Macular hole;  NVD neovascularization of the disc; NVE neovascularization elsewhere; AREDS age related eye disease study; ARMD age related macular degeneration; POAG primary open angle glaucoma; EBMD epithelial/anterior basement membrane dystrophy; ACIOL anterior chamber intraocular lens; IOL intraocular lens; PCIOL posterior chamber intraocular lens; Phaco/IOL phacoemulsification with intraocular lens placement; [place] photorefractive keratectomy; LASIK laser assisted in situ keratomileusis; HTN hypertension; DM diabetes mellitus; COPD chronic obstructive pulmonary disease

## 2021-08-31 ENCOUNTER — Encounter (INDEPENDENT_AMBULATORY_CARE_PROVIDER_SITE_OTHER): Payer: 59 | Admitting: Ophthalmology

## 2021-08-31 DIAGNOSIS — I10 Essential (primary) hypertension: Secondary | ICD-10-CM

## 2021-08-31 DIAGNOSIS — H34832 Tributary (branch) retinal vein occlusion, left eye, with macular edema: Secondary | ICD-10-CM

## 2021-08-31 DIAGNOSIS — H25813 Combined forms of age-related cataract, bilateral: Secondary | ICD-10-CM

## 2021-08-31 DIAGNOSIS — E119 Type 2 diabetes mellitus without complications: Secondary | ICD-10-CM

## 2021-08-31 DIAGNOSIS — H35033 Hypertensive retinopathy, bilateral: Secondary | ICD-10-CM

## 2021-09-01 ENCOUNTER — Encounter (INDEPENDENT_AMBULATORY_CARE_PROVIDER_SITE_OTHER): Payer: 59 | Admitting: Ophthalmology

## 2021-09-06 ENCOUNTER — Other Ambulatory Visit: Payer: Self-pay | Admitting: Interventional Cardiology

## 2021-09-06 DIAGNOSIS — I2510 Atherosclerotic heart disease of native coronary artery without angina pectoris: Secondary | ICD-10-CM

## 2021-09-06 DIAGNOSIS — I10 Essential (primary) hypertension: Secondary | ICD-10-CM

## 2021-10-03 ENCOUNTER — Other Ambulatory Visit: Payer: Self-pay | Admitting: Family Medicine

## 2021-10-03 ENCOUNTER — Other Ambulatory Visit: Payer: Self-pay | Admitting: Interventional Cardiology

## 2021-10-03 DIAGNOSIS — I10 Essential (primary) hypertension: Secondary | ICD-10-CM

## 2021-10-04 ENCOUNTER — Ambulatory Visit: Payer: 59 | Admitting: Interventional Cardiology

## 2021-10-05 NOTE — Progress Notes (Deleted)
?  ?Cardiology Office Note ? ? ?Date:  10/05/2021  ? ?ID:  Danielle Flores, DOB 1966/02/09, MRN [id number] ? ?PCP:  Danielle Held, DO  ? ? ?No chief complaint on file. ? ? ? ?Wt Readings from Last 3 Encounters:  ?05/02/21 214 lb 12.8 oz (97.4 kg)  ?03/04/21 218 lb (98.9 kg)  ?04/09/20 204 lb 9.6 oz (92.8 kg)  ?  ? ?  ?History of Present Illness: ?Danielle Flores is a 56 y.o. female   with a hx of HTN, HLD, CAD and uncontrolled DM. ?  ?"Cath in 09/2018 for NSTEMI showed: ?Prox RCA to Mid RCA lesion is 25% stenosed. ?Prox Cx to Mid Cx lesion is 25% stenosed. ?Ost LAD lesion is 25% stenosed. ?RPDA lesion is 95% stenosed. ?A drug-eluting stent was successfully placed using a STENT SYNERGY DES 2.5X16. ?Post intervention, there is a 0% residual stenosis. ?The left ventricular ejection fraction is greater than 65% by visual estimate. ?There is hyperdynamic left ventricular systolic function. ?LV end diastolic pressure is normal. LVEDP 7 mm Hg. ?There is no aortic valve stenosis. ?  ?Continue dual antiplatelet therapy." ?  ?In 2020: "She was laid off.  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   ?  ?BP at home has been high.  It has been in the 607P-710G systolic."  Lisinopril was increased.  ?  ?DM:  COntinue healthy diet and exercise.  Has had DKA in the past with dehydration.  Avoid diuretics at this time.  ? ? ? ?Past Medical History:  ?Diagnosis Date  ? Hyperglycemia   ? Hyperlipidemia   ? Hypertension   ? ? ?Past Surgical History:  ?Procedure Laterality Date  ? CESAREAN SECTION    ? CORONARY STENT INTERVENTION N/A 09/16/2018  ? Procedure: CORONARY STENT INTERVENTION;  Surgeon: Jettie Booze, MD;  Location: [facility];  Service: Cardiovascular;  Laterality: N/A;  ? LEFT HEART CATH AND CORONARY ANGIOGRAPHY N/A 09/16/2018  ? Procedure: LEFT HEART CATH AND CORONARY ANGIOGRAPHY;  Surgeon: Jettie Booze, MD;  Location: [facility];  Service: Cardiovascular;  Laterality: N/A;   ? ? ? ?Current Outpatient Medications  ?Medication Sig Dispense Refill  ? metoprolol tartrate (LOPRESSOR) 100 MG tablet TAKE 1 TABLET BY MOUTH EVERY MORNING, AT NOON AND AT BEDTIME. 90 tablet 0  ? amLODipine (NORVASC) 10 MG tablet Take 1 tablet (10 mg total) by mouth daily. Patient needs to keep appointment in March for future refills. 1st attempt. 30 tablet 0  ? benzonatate (TESSALON) 200 MG capsule Take 1 capsule (200 mg total) by mouth 2 (two) times daily as needed for cough. (Patient not taking: Reported on 08/03/2021) 20 capsule 0  ? blood glucose meter kit and supplies KIT Dispense based on patient and insurance preference. Use up to four times daily as directed. (FOR ICD-9 250.00, 250.01). 1 each 0  ? blood glucose meter kit and supplies Relion Prime or Dispense other brand based on patient and insurance preference. Use up to four times daily as directed. (FOR ICD-9 250.00, 250.01). 1 each 11  ? dapagliflozin propanediol (FARXIGA) 10 MG TABS tablet Take 1 tablet (10 mg total) by mouth daily before breakfast. 30 tablet 2  ? ezetimibe (ZETIA) 10 MG tablet TAKE 1 TABLET(10 MG) BY MOUTH DAILY 90 tablet 0  ? insulin isophane & regular human KwikPen (NOVOLIN 70/30 KWIKPEN) (70-30) 100 UNIT/ML KwikPen Inject 22 Units into the skin in the morning and at bedtime. 15 mL  3  ? lisinopril (ZESTRIL) 40 MG tablet Take 1 tablet (40 mg total) by mouth daily. Patient needs to keep appointment in March for future refills. 1st attempt. 30 tablet 0  ? metFORMIN (GLUCOPHAGE) 500 MG tablet TAKE 2 TABLETS(1000 MG) BY MOUTH TWICE DAILY WITH A MEAL 360 tablet 0  ? rosuvastatin (CRESTOR) 20 MG tablet Take 1 tablet (20 mg total) by mouth daily. Patient needs to keep appointment in March for future refills. 1st attempt. 30 tablet 0  ? ticagrelor (BRILINTA) 60 MG TABS tablet Take 1 tablet (60 mg total) by mouth 2 (two) times daily. Please keep upcoming appt in February 2023 with Dr. Irish Lack before anymore refills. Thank you 180 tablet 1   ? ?No current facility-administered medications for this visit.  ? ? ?Allergies:   Atorvastatin  ? ? ?Social History:  The patient  reports that she has never smoked. She has never used smokeless tobacco. She reports current alcohol use. She reports that she does not use drugs.  ? ?Family History:  The patient's ***family history includes Diabetes in her mother, sister, and another family member; Heart disease in her maternal grandmother; Sickle cell anemia in her paternal aunt.  ? ? ?ROS:  Please see the history of present illness.   Otherwise, review of systems are positive for ***.   All other systems are reviewed and negative.  ? ? ?PHYSICAL EXAM: ?VS:  LMP  (LMP Unknown)  , BMI There is no height or weight on file to calculate BMI. ?GEN: Well nourished, well developed, in no acute distress ?HEENT: normal ?Neck: no JVD, carotid bruits, or masses ?Cardiac: ***RRR; no murmurs, rubs, or gallops,no edema  ?Respiratory:  clear to auscultation bilaterally, normal work of breathing ?GI: soft, nontender, nondistended, + BS ?MS: no deformity or atrophy ?Skin: warm and dry, no rash ?Neuro:  Strength and sensation are intact ?Psych: euthymic mood, full affect ? ? ?EKG:   ?The ekg ordered today demonstrates *** ? ? ?Recent Labs: ?03/04/2021: Hemoglobin 12.8; Platelets 271.0 ?06/07/2021: ALT 43; BUN 10; Creatinine, Ser 1.11; Potassium 4.0; Sodium 139  ? ?Lipid Panel ?   ?Component Value Date/Time  ? CHOL 100 06/07/2021 1258  ? CHOL 124 10/04/2020 0854  ? TRIG 96.0 06/07/2021 1258  ? HDL 32.20 (L) 06/07/2021 1258  ? HDL 42 10/04/2020 0854  ? CHOLHDL 3 06/07/2021 1258  ? VLDL 19.2 06/07/2021 1258  ? LDLCALC 48 06/07/2021 1258  ? [place] 62 10/04/2020 0854  ? LDLDIRECT 131.0 01/08/2020 1342  ? ?  ?Other studies Reviewed: ?Additional studies/ records that were reviewed today with results demonstrating: ***. ? ? ?ASSESSMENT AND PLAN: ? ?CAD:  ?Hyperlipidemia: ?Hypertension: ?Diabetes type 2: ?She works with [employer] ? ? ?Current  medicines are reviewed at length with the patient today.  The patient concerns regarding her medicines were addressed. ? ?The following changes have been made:  No change*** ? ?Labs/ tests ordered today include: *** ?No orders of the defined types were placed in this encounter. ? ? ?Recommend 150 minutes/week of aerobic exercise ?Low fat, low carb, high fiber diet recommended ? ?Disposition:   FU in *** ? ? ?Signed, ?Larae Grooms, MD  ?10/05/2021 2:08 PM    ?[facility] ?[address], [place], [place]  [address] ?Phone: [phone]; Fax: [phone]  ? ?

## 2021-10-06 ENCOUNTER — Ambulatory Visit: Payer: 59 | Admitting: Interventional Cardiology

## 2021-10-06 DIAGNOSIS — R748 Abnormal levels of other serum enzymes: Secondary | ICD-10-CM

## 2021-10-06 DIAGNOSIS — I119 Hypertensive heart disease without heart failure: Secondary | ICD-10-CM

## 2021-10-06 DIAGNOSIS — I252 Old myocardial infarction: Secondary | ICD-10-CM

## 2021-10-06 DIAGNOSIS — E785 Hyperlipidemia, unspecified: Secondary | ICD-10-CM

## 2021-10-06 DIAGNOSIS — E1110 Type 2 diabetes mellitus with ketoacidosis without coma: Secondary | ICD-10-CM

## 2021-10-06 DIAGNOSIS — I2510 Atherosclerotic heart disease of native coronary artery without angina pectoris: Secondary | ICD-10-CM

## 2021-10-16 NOTE — Progress Notes (Unsigned)
Cardiology Office Note   Date:  10/16/2021   ID:  Danielle Flores, DOB 1966-04-13, MRN [id number]  PCP:  Carollee Herter, Alferd Apa, DO    No chief complaint on file.  CAD  Wt Readings from Last 3 Encounters:  05/02/21 214 lb 12.8 oz (97.4 kg)  03/04/21 218 lb (98.9 kg)  04/09/20 204 lb 9.6 oz (92.8 kg)       History of Present Illness: Danielle Flores is a 56 y.o. female   with a hx of HTN, HLD, CAD and uncontrolled DM.  2020 MI: " She felt cramps in both hands at the time of her MI and felt unwell generally.  She was thought to be dehydrated."   "Cath in 09/2018 for NSTEMI showed: Prox RCA to Mid RCA lesion is 25% stenosed. Prox Cx to Mid Cx lesion is 25% stenosed. Ost LAD lesion is 25% stenosed. RPDA lesion is 95% stenosed. A drug-eluting stent was successfully placed using a STENT SYNERGY DES 2.5X16. Post intervention, there is a 0% residual stenosis. The left ventricular ejection fraction is greater than 65% by visual estimate. There is hyperdynamic left ventricular systolic function. LV end diastolic pressure is normal. LVEDP 7 mm Hg. There is no aortic valve stenosis.   Continue dual antiplatelet therapy."   In 2020: "She was laid off.  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     BP at home has been high.  It has been in the 767M-094B systolic."  Lisinopril was increased.    DM:  COntinue healthy diet and exercise.  Has had DKA in the past with dehydration.  Avoid diuretics at this time.    Needs cardiology clearance for colonoscopy in 11/2021.    Past Surgical History:  Procedure Laterality Date   CESAREAN SECTION     CORONARY STENT INTERVENTION N/A 09/16/2018   Procedure: CORONARY STENT INTERVENTION;  Surgeon: Jettie Booze, MD;  Location: [facility];  Service: Cardiovascular;  Laterality: N/A;   LEFT HEART CATH AND CORONARY ANGIOGRAPHY N/A 09/16/2018   Procedure: LEFT HEART CATH AND CORONARY ANGIOGRAPHY;  Surgeon: Jettie Booze,  MD;  Location: [facility];  Service: Cardiovascular;  Laterality: N/A;     Current Outpatient Medications  Medication Sig Dispense Refill   metoprolol tartrate (LOPRESSOR) 100 MG tablet TAKE 1 TABLET BY MOUTH EVERY MORNING, AT NOON AND AT BEDTIME. 90 tablet 0   amLODipine (NORVASC) 10 MG tablet Take 1 tablet (10 mg total) by mouth daily. Patient needs to keep appointment in March for future refills. 1st attempt. 30 tablet 0   benzonatate (TESSALON) 200 MG capsule Take 1 capsule (200 mg total) by mouth 2 (two) times daily as needed for cough. (Patient not taking: Reported on 08/03/2021) 20 capsule 0   blood glucose meter kit and supplies KIT Dispense based on patient and insurance preference. Use up to four times daily as directed. (FOR ICD-9 250.00, 250.01). 1 each 0   blood glucose meter kit and supplies Relion Prime or Dispense other brand based on patient and insurance preference. Use up to four times daily as directed. (FOR ICD-9 250.00, 250.01). 1 each 11   dapagliflozin propanediol (FARXIGA) 10 MG TABS tablet Take 1 tablet (10 mg total) by mouth daily before breakfast. 30 tablet 2   ezetimibe (ZETIA) 10 MG tablet TAKE 1 TABLET(10 MG) BY MOUTH DAILY 90 tablet 0   insulin isophane & regular human KwikPen (NOVOLIN 70/30 KWIKPEN) (70-30) 100 UNIT/ML  KwikPen Inject 22 Units into the skin in the morning and at bedtime. 15 mL 3   lisinopril (ZESTRIL) 40 MG tablet Take 1 tablet (40 mg total) by mouth daily. Patient needs to keep appointment in March for future refills. 1st attempt. 30 tablet 0   metFORMIN (GLUCOPHAGE) 500 MG tablet TAKE 2 TABLETS(1000 MG) BY MOUTH TWICE DAILY WITH A MEAL 360 tablet 0   rosuvastatin (CRESTOR) 20 MG tablet Take 1 tablet (20 mg total) by mouth daily. Patient needs to keep appointment in March for future refills. 1st attempt. 30 tablet 0   ticagrelor (BRILINTA) 60 MG TABS tablet Take 1 tablet (60 mg total) by mouth 2 (two) times daily. Please keep upcoming appt in  February 2023 with Dr. Irish Lack before anymore refills. Thank you 180 tablet 1   No current facility-administered medications for this visit.    Allergies:   Atorvastatin    Social History:  The patient  reports that she has never smoked. She has never used smokeless tobacco. She reports current alcohol use. She reports that she does not use drugs.   Family History:  The patient's ***family history includes Diabetes in her mother, sister, and another family member; Heart disease in her maternal grandmother; Sickle cell anemia in her paternal aunt.    ROS:  Please see the history of present illness.   Otherwise, review of systems are positive for ***.   All other systems are reviewed and negative.    PHYSICAL EXAM: VS:  LMP  (LMP Unknown)  , BMI There is no height or weight on file to calculate BMI. GEN: Well nourished, well developed, in no acute distress HEENT: normal Neck: no JVD, carotid bruits, or masses Cardiac: ***RRR; no murmurs, rubs, or gallops,no edema  Respiratory:  clear to auscultation bilaterally, normal work of breathing GI: soft, nontender, nondistended, + BS MS: no deformity or atrophy Skin: warm and dry, no rash Neuro:  Strength and sensation are intact Psych: euthymic mood, full affect   EKG:   The ekg ordered today demonstrates ***   Recent Labs: 03/04/2021: Hemoglobin 12.8; Platelets 271.0 06/07/2021: ALT 43; BUN 10; Creatinine, Ser 1.11; Potassium 4.0; Sodium 139   Lipid Panel    Component Value Date/Time   CHOL 100 06/07/2021 1258   CHOL 124 10/04/2020 0854   TRIG 96.0 06/07/2021 1258   HDL 32.20 (L) 06/07/2021 1258   HDL 42 10/04/2020 0854   CHOLHDL 3 06/07/2021 1258   VLDL 19.2 06/07/2021 1258   LDLCALC 48 06/07/2021 1258   LDLCALC 62 10/04/2020 0854   LDLDIRECT 131.0 01/08/2020 1342     Other studies Reviewed: Additional studies/ records that were reviewed today with results demonstrating: ***.   ASSESSMENT AND  PLAN:  CAD: Preoperative cardiovascular exam:  Hyperlipidemia: HTN: DM2: Worked with [place]   Current medicines are reviewed at length with the patient today.  The patient concerns regarding her medicines were addressed.  The following changes have been made:  No change***  Labs/ tests ordered today include: *** No orders of the defined types were placed in this encounter.   Recommend 150 minutes/week of aerobic exercise Low fat, low carb, high fiber diet recommended  Disposition:   FU in ***   Signed, Larae Grooms, MD  10/16/2021 6:49 PM    [facility] [place], [place], [place]  [address] Phone: [phone]; Fax: [phone]

## 2021-10-18 ENCOUNTER — Ambulatory Visit (INDEPENDENT_AMBULATORY_CARE_PROVIDER_SITE_OTHER): Payer: 59 | Admitting: Interventional Cardiology

## 2021-10-18 ENCOUNTER — Encounter: Payer: Self-pay | Admitting: Interventional Cardiology

## 2021-10-18 ENCOUNTER — Other Ambulatory Visit: Payer: Self-pay

## 2021-10-18 VITALS — BP 138/78 | HR 71 | Ht 66.0 in | Wt 202.0 lb

## 2021-10-18 DIAGNOSIS — I252 Old myocardial infarction: Secondary | ICD-10-CM

## 2021-10-18 DIAGNOSIS — I10 Essential (primary) hypertension: Secondary | ICD-10-CM

## 2021-10-18 DIAGNOSIS — I2510 Atherosclerotic heart disease of native coronary artery without angina pectoris: Secondary | ICD-10-CM

## 2021-10-18 DIAGNOSIS — R748 Abnormal levels of other serum enzymes: Secondary | ICD-10-CM

## 2021-10-18 DIAGNOSIS — E1110 Type 2 diabetes mellitus with ketoacidosis without coma: Secondary | ICD-10-CM

## 2021-10-18 DIAGNOSIS — E785 Hyperlipidemia, unspecified: Secondary | ICD-10-CM

## 2021-10-18 DIAGNOSIS — I119 Hypertensive heart disease without heart failure: Secondary | ICD-10-CM

## 2021-10-18 MED ORDER — TICAGRELOR 60 MG PO TABS: 60.0000 mg | ORAL_TABLET | Freq: Two times a day (BID) | ORAL | 3 refills | Status: DC

## 2021-10-18 MED ORDER — LISINOPRIL 40 MG PO TABS: 40.0000 mg | ORAL_TABLET | Freq: Every day | ORAL | 3 refills | Status: DC

## 2021-10-18 MED ORDER — ROSUVASTATIN CALCIUM 20 MG PO TABS: 20.0000 mg | ORAL_TABLET | Freq: Every day | ORAL | 3 refills | Status: DC

## 2021-10-18 MED ORDER — AMLODIPINE BESYLATE 10 MG PO TABS: 10.0000 mg | ORAL_TABLET | Freq: Every day | ORAL | 3 refills | Status: DC

## 2021-10-18 NOTE — Patient Instructions (Signed)
Medication Instructions:  ? ? ?Your physician recommends that you continue on your current medications as directed. Please refer to the Current Medication list given to you today. ? ?*If you need a refill on your cardiac medications before your next appointment, please call your pharmacy* ? ? ?Lab Work: NONE ORDERED  TODAY ? ? ?If you have labs (blood work) drawn today and your tests are completely normal, you will receive your results only by: ?MyChart Message (if you have MyChart) OR ?A paper copy in the mail ?If you have any lab test that is abnormal or we need to change your treatment, we will call you to review the results. ? ? ?Testing/Procedures: NONE ORDERED  TODAY ?High-Fiber Eating Plan ?Fiber, also called dietary fiber, is a type of carbohydrate. It is found foods such as fruits, vegetables, whole grains, and beans. A high-fiber diet can have many health benefits. Your health care provider may recommend a high-fiber diet to help: ?Prevent constipation. Fiber can make your bowel movements more regular. ?Lower your cholesterol. ?Relieve the following conditions: ?Inflammation of veins in the anus (hemorrhoids). ?Inflammation of specific areas of the digestive tract (uncomplicated diverticulosis). ?A problem of the large intestine, also called the colon, that sometimes causes pain and diarrhea (irritable bowel syndrome, or IBS). ?Prevent overeating as part of a weight-loss plan. ?Prevent heart disease, type 2 diabetes, and certain cancers. ?What are tips for following this plan? ?Reading food labels ? ?Check the nutrition facts label on food products for the amount of dietary fiber. Choose foods that have 5 grams of fiber or more per serving. ?The goals for recommended daily fiber intake include: ?Men (age 42 or younger): 34-38 g. ?Men (over age 30): 28-34 g. ?Women (age 34 or younger): 25-28 g. ?Women (over age 37): 22-25 g. ?Your daily fiber goal is _____________ g. ?Shopping ?Choose whole fruits and  vegetables instead of processed forms, such as apple juice or applesauce. ?Choose a wide variety of high-fiber foods such as avocados, lentils, oats, and kidney beans. ?Read the nutrition facts label of the foods you choose. Be aware of foods with added fiber. These foods often have high sugar and sodium amounts per serving. ?Cooking ?Use whole-grain flour for baking and cooking. ?Cook with brown rice instead of white rice. ?Meal planning ?Start the day with a breakfast that is high in fiber, such as a cereal that contains 5 g of fiber or more per serving. ?Eat breads and cereals that are made with whole-grain flour instead of refined flour or white flour. ?Eat brown rice, bulgur wheat, or millet instead of white rice. ?Use beans in place of meat in soups, salads, and pasta dishes. ?Be sure that half of the grains you eat each day are whole grains. ?General information ?You can get the recommended daily intake of dietary fiber by: ?Eating a variety of fruits, vegetables, grains, nuts, and beans. ?Taking a fiber supplement if you are not able to take in enough fiber in your diet. It is better to get fiber through food than from a supplement. ?Gradually increase how much fiber you consume. If you increase your intake of dietary fiber too quickly, you may have bloating, cramping, or gas. ?Drink plenty of water to help you digest fiber. ?Choose high-fiber snacks, such as berries, raw vegetables, nuts, and popcorn. ?What foods should I eat? ?Fruits ?Berries. Pears. Apples. Oranges. Avocado. Prunes and raisins. Dried figs. ?Vegetables ?Sweet potatoes. Spinach. Kale. Artichokes. Cabbage. Broccoli. Cauliflower. Green peas. Carrots. Squash. ?Grains ?Whole-grain breads.  Multigrain cereal. Oats and oatmeal. Brown rice. Barley. Bulgur wheat. Millet. Quinoa. Bran muffins. Popcorn. Rye wafer crackers. ?Meats and other proteins ?Navy beans, kidney beans, and pinto beans. Soybeans. Split peas. Lentils. Nuts and  seeds. ?Dairy ?Fiber-fortified yogurt. ?Beverages ?Fiber-fortified soy milk. Fiber-fortified orange juice. ?Other foods ?Fiber bars. ?The items listed above may not be a complete list of recommended foods and beverages. Contact a dietitian for more information. ?What foods should I avoid? ?Fruits ?Fruit juice. Cooked, strained fruit. ?Vegetables ?Fried potatoes. Canned vegetables. Well-cooked vegetables. ?Grains ?White bread. Pasta made with refined flour. White rice. ?Meats and other proteins ?Fatty cuts of meat. Fried chicken or fried fish. ?Dairy ?Milk. Yogurt. Cream cheese. Sour cream. ?Fats and oils ?Butters. ?Beverages ?Soft drinks. ?Other foods ?Cakes and pastries. ?The items listed above may not be a complete list of foods and beverages to avoid. Talk with your dietitian about what choices are best for you. ?Summary ?Fiber is a type of carbohydrate. It is found in foods such as fruits, vegetables, whole grains, and beans. ?A high-fiber diet has many benefits. It can help to prevent constipation, lower blood cholesterol, aid weight loss, and reduce your risk of heart disease, diabetes, and certain cancers. ?Increase your intake of fiber gradually. Increasing fiber too quickly may cause cramping, bloating, and gas. Drink plenty of water while you increase the amount of fiber you consume. ?The best sources of fiber include whole fruits and vegetables, whole grains, nuts, seeds, and beans. ?This information is not intended to replace advice given to you by your health care provider. Make sure you discuss any questions you have with your health care provider. ?Document Revised: 11/27/2019 Document Reviewed: 11/27/2019 ?Elsevier Patient Education ? 2022 Elsevier Inc. ? ? ? ? ?Follow-Up: ?At [facility], you and your health needs are our priority.  As part of our continuing mission to provide you with exceptional heart care, we have created designated Provider Care Teams.  These Care Teams include your primary  Cardiologist (physician) and Advanced Practice Providers (APPs -  Physician Assistants and Nurse Practitioners) who all work together to provide you with the care you need, when you need it. ? ?We recommend signing up for the patient portal called "MyChart".  Sign up information is provided on this After Visit Summary.  MyChart is used to connect with patients for Virtual Visits (Telemedicine).  Patients are able to view lab/test results, encounter notes, upcoming appointments, etc.  Non-urgent messages can be sent to your provider as well.   ?To learn more about what you can do with MyChart, go to [email or web address].   ? ?Your next appointment:   ?1 year(s) ? ?The format for your next appointment:   ?In Person ? ?Provider:   ?Lance Muss, MD   ? ? ?Other Instructions ? ?

## 2021-10-19 ENCOUNTER — Telehealth: Payer: Self-pay

## 2021-10-19 NOTE — Telephone Encounter (Signed)
**Note De-Identified Donterrius Santucci Obfuscation** We received a Brilinta RX form Jairen Goldfarb fax from [place] and [place]. ?I have completed the form (Brilinta 60 mg #180 with 3 refills) and have e-mailed it to Dr Hoyle Barr nurse so she can obtain his signature, date it, and to fax to [facility] and [place] at the fax number written on the cover letter included. ?

## 2021-10-20 NOTE — Telephone Encounter (Signed)
Signed paperwork faxed 

## 2021-11-08 ENCOUNTER — Other Ambulatory Visit: Payer: Self-pay | Admitting: Interventional Cardiology

## 2021-11-08 DIAGNOSIS — I2510 Atherosclerotic heart disease of native coronary artery without angina pectoris: Secondary | ICD-10-CM

## 2021-11-11 ENCOUNTER — Encounter: Payer: Self-pay | Admitting: Family Medicine

## 2021-11-18 ENCOUNTER — Ambulatory Visit: Payer: Self-pay | Admitting: Family Medicine

## 2021-11-25 ENCOUNTER — Other Ambulatory Visit: Payer: Self-pay | Admitting: Family Medicine

## 2021-11-25 ENCOUNTER — Encounter: Payer: Self-pay | Admitting: Family Medicine

## 2021-11-25 DIAGNOSIS — I10 Essential (primary) hypertension: Secondary | ICD-10-CM

## 2021-11-25 MED ORDER — DAPAGLIFLOZIN PROPANEDIOL 10 MG PO TABS: 10.0000 mg | ORAL_TABLET | Freq: Every day | ORAL | 2 refills | Status: DC

## 2021-12-05 ENCOUNTER — Ambulatory Visit (INDEPENDENT_AMBULATORY_CARE_PROVIDER_SITE_OTHER): Payer: BLUE CROSS/BLUE SHIELD | Admitting: Family Medicine

## 2021-12-05 ENCOUNTER — Encounter: Payer: Self-pay | Admitting: Family Medicine

## 2021-12-05 VITALS — BP 132/82 | HR 74 | Temp 97.8°F | Resp 18 | Ht 66.0 in | Wt 204.8 lb

## 2021-12-05 DIAGNOSIS — R768 Other specified abnormal immunological findings in serum: Secondary | ICD-10-CM

## 2021-12-05 DIAGNOSIS — E785 Hyperlipidemia, unspecified: Secondary | ICD-10-CM

## 2021-12-05 DIAGNOSIS — E1165 Type 2 diabetes mellitus with hyperglycemia: Secondary | ICD-10-CM

## 2021-12-05 LAB — CBC WITH DIFFERENTIAL/PLATELET
Basophils Absolute: 0.1 10*3/uL (ref 0.0–0.1)
Basophils Relative: 1 % (ref 0.0–3.0)
Eosinophils Absolute: 0.3 10*3/uL (ref 0.0–0.7)
Eosinophils Relative: 5.5 % — ABNORMAL HIGH (ref 0.0–5.0)
HCT: 44.7 % (ref 36.0–46.0)
Hemoglobin: 14.2 g/dL (ref 12.0–15.0)
Lymphocytes Relative: 39.1 % (ref 12.0–46.0)
Lymphs Abs: 2.4 10*3/uL (ref 0.7–4.0)
MCHC: 31.8 g/dL (ref 30.0–36.0)
MCV: 76.3 fl — ABNORMAL LOW (ref 78.0–100.0)
Monocytes Absolute: 0.5 10*3/uL (ref 0.1–1.0)
Monocytes Relative: 8.8 % (ref 3.0–12.0)
Neutro Abs: 2.8 10*3/uL (ref 1.4–7.7)
Neutrophils Relative %: 45.6 % (ref 43.0–77.0)
Platelets: 262 10*3/uL (ref 150.0–400.0)
RBC: 5.85 Mil/uL — ABNORMAL HIGH (ref 3.87–5.11)
RDW: 15.1 % (ref 11.5–15.5)
WBC: 6.2 10*3/uL (ref 4.0–10.5)

## 2021-12-05 LAB — COMPREHENSIVE METABOLIC PANEL
ALT: 42 U/L — ABNORMAL HIGH (ref 0–35)
AST: 33 U/L (ref 0–37)
Albumin: 4.6 g/dL (ref 3.5–5.2)
Alkaline Phosphatase: 83 U/L (ref 39–117)
BUN: 12 mg/dL (ref 6–23)
CO2: 27 mEq/L (ref 19–32)
Calcium: 9.9 mg/dL (ref 8.4–10.5)
Chloride: 105 mEq/L (ref 96–112)
Creatinine, Ser: 0.97 mg/dL (ref 0.40–1.20)
GFR: 64.54 mL/min (ref >60.00)
Glucose, Bld: 66 mg/dL — ABNORMAL LOW (ref 70–99)
Potassium: 4.1 mEq/L (ref 3.5–5.1)
Sodium: 142 mEq/L (ref 135–145)
Total Bilirubin: 0.4 mg/dL (ref 0.2–1.2)
Total Protein: 7.7 g/dL (ref 6.0–8.3)

## 2021-12-05 LAB — LIPID PANEL
Cholesterol: 113 mg/dL (ref 0–200)
HDL: 40.3 mg/dL (ref >39.00)
LDL Cholesterol: 52 mg/dL (ref 0–99)
NonHDL: 72.5
Total CHOL/HDL Ratio: 3
Triglycerides: 103 mg/dL (ref 0.0–149.0)
VLDL: 20.6 mg/dL (ref 0.0–40.0)

## 2021-12-05 LAB — HEMOGLOBIN A1C: Hgb A1c MFr Bld: 8.8 % — ABNORMAL HIGH (ref 4.6–6.5)

## 2021-12-05 NOTE — Assessment & Plan Note (Signed)
Repeat today

## 2021-12-05 NOTE — Patient Instructions (Signed)

## 2021-12-05 NOTE — Assessment & Plan Note (Signed)
hgba1c to be checked, minimize simple carbs. Increase exercise as tolerated. Continue current meds  

## 2021-12-05 NOTE — Progress Notes (Addendum)
? ?Subjective:  ? ?By signing my name below, I, Danielle Flores, attest that this documentation has been prepared under the direction and in the presence of [name], 12/05/2021   ? ? Patient ID: Danielle Flores, female    DOB: 1966/03/04, 56 y.o.   MRN: [id number] ? ?Chief Complaint  ?Patient presents with  ? Hyperlipidemia  ? Diabetes  ? ANA Level  ? ? ?HPI ?Patient is in today for an office visit. ? ?She checks her blood sugars and reports that levels are well. Her blood sugar ranges between 110 - 140 mg/dL ?Lab Results  ?Component Value Date  ? HGBA1C 8.4 (H) 06/07/2021  ? ?She went to Dr. Benjamine Mola on 05/02/2021 and was diagnosed with bursitis. She previously took Instaflex but about two weeks ago switched to tumeric and ginger with pepper supplements to help symptoms.  ? ?Past Medical History:  ?Diagnosis Date  ? Hyperglycemia   ? Hyperlipidemia   ? Hypertension   ? ? ?Past Surgical History:  ?Procedure Laterality Date  ? CESAREAN SECTION    ? CORONARY STENT INTERVENTION N/A 09/16/2018  ? Procedure: CORONARY STENT INTERVENTION;  Surgeon: Jettie Booze, MD;  Location: [facility];  Service: Cardiovascular;  Laterality: N/A;  ? LEFT HEART CATH AND CORONARY ANGIOGRAPHY N/A 09/16/2018  ? Procedure: LEFT HEART CATH AND CORONARY ANGIOGRAPHY;  Surgeon: Jettie Booze, MD;  Location: [facility];  Service: Cardiovascular;  Laterality: N/A;  ? ? ?Family History  ?Problem Relation Age of Onset  ? Diabetes Mother   ? Diabetes Sister   ? Sickle cell anemia Paternal Aunt   ? Heart disease Maternal Grandmother   ?     heart enlargement  ? Diabetes Other   ?     1st degree relative  ? ? ?Social History  ? ?Socioeconomic History  ? Marital status: Married  ?  Spouse name: Not on file  ? Number of children: Not on file  ? Years of education: Not on file  ? Highest education level: Not on file  ?Occupational History  ? Occupation: cleans houses  ?Tobacco Use  ? Smoking status: Never  ? Smokeless  tobacco: Never  ?Vaping Use  ? Vaping Use: Never used  ?Substance and Sexual Activity  ? Alcohol use: Yes  ?  Comment: twice montly  ? Drug use: No  ? Sexual activity: Not on file  ?Other Topics Concern  ? Not on file  ?Social History Narrative  ? Not on file  ? ?Social Determinants of Health  ? ?Financial Resource Strain: High Risk  ? Difficulty of Paying Living Expenses: Hard  ?Food Insecurity: Not on file  ?Transportation Needs: Not on file  ?Physical Activity: Not on file  ?Stress: Not on file  ?Social Connections: Not on file  ?Intimate Partner Violence: Not on file  ? ? ?Outpatient Medications Prior to Visit  ?Medication Sig Dispense Refill  ? aspirin EC 81 MG tablet Take 1 tablet by mouth daily.    ? blood glucose meter kit and supplies Relion Prime or Dispense other brand based on patient and insurance preference. Use up to four times daily as directed. (FOR ICD-9 250.00, 250.01). 1 each 11  ? dapagliflozin propanediol (FARXIGA) 10 MG TABS tablet Take 1 tablet (10 mg total) by mouth daily before breakfast. 30 tablet 2  ? ezetimibe (ZETIA) 10 MG tablet TAKE 1 TABLET(10 MG) BY MOUTH DAILY 90 tablet 3  ? insulin isophane & regular human  KwikPen (NOVOLIN 70/30 KWIKPEN) (70-30) 100 UNIT/ML KwikPen Inject 22 Units into the skin in the morning and at bedtime. 15 mL 3  ? lisinopril (ZESTRIL) 40 MG tablet Take 1 tablet (40 mg total) by mouth daily. 90 tablet 3  ? metFORMIN (GLUCOPHAGE) 500 MG tablet TAKE 2 TABLETS(1000 MG) BY MOUTH TWICE DAILY WITH A MEAL 360 tablet 0  ? metoprolol tartrate (LOPRESSOR) 100 MG tablet TAKE 1 TABLET BY MOUTH EVERY MORNING, AT NOON AND AT BEDTIME 90 tablet 0  ? ticagrelor (BRILINTA) 60 MG TABS tablet Take 1 tablet (60 mg total) by mouth 2 (two) times daily. 180 tablet 3  ? amLODipine (NORVASC) 10 MG tablet Take 1 tablet (10 mg total) by mouth daily. 90 tablet 3  ? rosuvastatin (CRESTOR) 20 MG tablet Take 1 tablet (20 mg total) by mouth daily. 90 tablet 3  ? ?No facility-administered  medications prior to visit.  ? ? ?Allergies  ?Allergen Reactions  ? Atorvastatin Other (See Comments)  ?  Myalgias  ? ? ?Review of Systems  ?Constitutional:  Negative for fever and malaise/fatigue.  ?HENT:  Negative for congestion.   ?Eyes:  Negative for blurred vision.  ?Respiratory:  Negative for shortness of breath.   ?Cardiovascular:  Negative for chest pain, palpitations and leg swelling.  ?Gastrointestinal:  Negative for abdominal pain, blood in stool and nausea.  ?Genitourinary:  Negative for dysuria and frequency.  ?Musculoskeletal:  Negative for falls.  ?Skin:  Negative for rash.  ?Neurological:  Negative for dizziness, loss of consciousness and headaches.  ?Endo/Heme/Allergies:  Negative for environmental allergies.  ?Psychiatric/Behavioral:  Negative for depression. The patient is not nervous/anxious.   ? ?   ?Objective:  ?  ?Physical Exam ?Vitals and nursing note reviewed.  ?Constitutional:   ?   General: She is not in acute distress. ?   Appearance: Normal appearance. She is not ill-appearing.  ?HENT:  ?   Head: Normocephalic and atraumatic.  ?   Right Ear: External ear normal.  ?   Left Ear: External ear normal.  ?Eyes:  ?   Extraocular Movements: Extraocular movements intact.  ?   Pupils: Pupils are equal, round, and reactive to light.  ?Cardiovascular:  ?   Rate and Rhythm: Normal rate and regular rhythm.  ?   Heart sounds: Normal heart sounds. No murmur heard. ?  No gallop.  ?Pulmonary:  ?   Effort: Pulmonary effort is normal. No respiratory distress.  ?   Breath sounds: Normal breath sounds. No wheezing or rales.  ?Skin: ?   General: Skin is warm and dry.  ?Neurological:  ?   Mental Status: She is alert and oriented to person, place, and time.  ?Psychiatric:     ?   Judgment: Judgment normal.  ? ? ?BP 132/82 (BP Location: Left Arm, Patient Position: Sitting, Cuff Size: Normal)   Pulse 74   Temp 97.8 ?F (36.6 ?C) (Oral)   Resp 18   Ht '5\' 6"'  (1.676 m)   Wt 204 lb 12.8 oz (92.9 kg)   LMP  (LMP  Unknown)   SpO2 97%   BMI 33.06 kg/m?  ?Wt Readings from Last 3 Encounters:  ?12/05/21 204 lb 12.8 oz (92.9 kg)  ?10/18/21 202 lb (91.6 kg)  ?05/02/21 214 lb 12.8 oz (97.4 kg)  ? ? ?Diabetic Foot Exam - Simple   ?No data filed ?  ? ?Lab Results  ?Component Value Date  ? WBC 6.4 03/04/2021  ? HGB 12.8 03/04/2021  ? HCT  39.8 03/04/2021  ? PLT 271.0 03/04/2021  ? GLUCOSE 127 (H) 06/07/2021  ? CHOL 100 06/07/2021  ? TRIG 96.0 06/07/2021  ? HDL 32.20 (L) 06/07/2021  ? LDLDIRECT 131.0 01/08/2020  ? [place] 48 06/07/2021  ? ALT 43 (H) 06/07/2021  ? AST 38 (H) 06/07/2021  ? NA 139 06/07/2021  ? K 4.0 06/07/2021  ? CL 103 06/07/2021  ? CREATININE 1.11 06/07/2021  ? BUN 10 06/07/2021  ? CO2 27 06/07/2021  ? TSH 1.66 10/21/2019  ? HGBA1C 8.4 (H) 06/07/2021  ? MICROALBUR 30.8 (H) 10/21/2019  ? ? ?Lab Results  ?Component Value Date  ? TSH 1.66 10/21/2019  ? ?Lab Results  ?Component Value Date  ? WBC 6.4 03/04/2021  ? HGB 12.8 03/04/2021  ? HCT 39.8 03/04/2021  ? MCV 72.6 (L) 03/04/2021  ? PLT 271.0 03/04/2021  ? ?Lab Results  ?Component Value Date  ? NA 139 06/07/2021  ? K 4.0 06/07/2021  ? CO2 27 06/07/2021  ? GLUCOSE 127 (H) 06/07/2021  ? BUN 10 06/07/2021  ? CREATININE 1.11 06/07/2021  ? BILITOT 0.5 06/07/2021  ? ALKPHOS 102 06/07/2021  ? AST 38 (H) 06/07/2021  ? ALT 43 (H) 06/07/2021  ? PROT 7.5 06/07/2021  ? ALBUMIN 4.4 06/07/2021  ? CALCIUM 10.0 06/07/2021  ? ANIONGAP 9 09/17/2018  ? GFR 55.09 (L) 06/07/2021  ? ?Lab Results  ?Component Value Date  ? CHOL 100 06/07/2021  ? ?Lab Results  ?Component Value Date  ? HDL 32.20 (L) 06/07/2021  ? ?Lab Results  ?Component Value Date  ? [place] 48 06/07/2021  ? ?Lab Results  ?Component Value Date  ? TRIG 96.0 06/07/2021  ? ?Lab Results  ?Component Value Date  ? CHOLHDL 3 06/07/2021  ? ?Lab Results  ?Component Value Date  ? HGBA1C 8.4 (H) 06/07/2021  ? ? ?   ?Assessment & Plan:  ? ?Problem List Items Addressed This Visit   ? ?  ? Unprioritized  ? Uncontrolled type 2 diabetes  mellitus with hyperglycemia ([place])  ?  hgba1c to be checked , minimize simple carbs. Increase exercise as tolerated. Continue current meds ? ? ?  ?  ? Positive ANA (antinuclear antibody)  ?  Repeat today ? ?  ?  ? Hyperlip

## 2021-12-05 NOTE — Assessment & Plan Note (Signed)
Encourage heart healthy diet such as MIND or DASH diet, increase exercise, avoid trans fats, simple carbohydrates and processed foods, consider a krill or fish or flaxseed oil cap daily.  °

## 2021-12-07 ENCOUNTER — Encounter: Payer: Self-pay | Admitting: Family Medicine

## 2021-12-07 LAB — ANTI-NUCLEAR AB-TITER (ANA TITER): ANA Titer 1: 1:320 {titer} — ABNORMAL HIGH

## 2021-12-07 LAB — ANA: Anti Nuclear Antibody (ANA): POSITIVE — AB

## 2021-12-13 ENCOUNTER — Other Ambulatory Visit: Payer: Self-pay

## 2021-12-13 DIAGNOSIS — E1165 Type 2 diabetes mellitus with hyperglycemia: Secondary | ICD-10-CM

## 2021-12-13 NOTE — Telephone Encounter (Signed)
Pt called to schedule A1C Labs per [facility] message to the pt. Did not see active request for labs in order to schedule her to have those labs done. Please Advise. ?

## 2021-12-13 NOTE — Telephone Encounter (Signed)
A1c order placed.

## 2022-01-04 ENCOUNTER — Other Ambulatory Visit: Payer: Self-pay | Admitting: Family Medicine

## 2022-01-04 ENCOUNTER — Encounter: Payer: Self-pay | Admitting: Family Medicine

## 2022-01-04 DIAGNOSIS — E1165 Type 2 diabetes mellitus with hyperglycemia: Secondary | ICD-10-CM

## 2022-01-04 MED ORDER — DAPAGLIFLOZIN PROPANEDIOL 10 MG PO TABS: 10.0000 mg | ORAL_TABLET | Freq: Every day | ORAL | 5 refills | Status: DC

## 2022-01-23 ENCOUNTER — Other Ambulatory Visit: Payer: Self-pay | Admitting: Family Medicine

## 2022-01-23 DIAGNOSIS — I10 Essential (primary) hypertension: Secondary | ICD-10-CM

## 2022-01-27 ENCOUNTER — Encounter: Payer: Self-pay | Admitting: Interventional Cardiology

## 2022-01-29 LAB — COLOGUARD: COLOGUARD: NEGATIVE

## 2022-01-29 LAB — EXTERNAL GENERIC LAB PROCEDURE: COLOGUARD: NEGATIVE

## 2022-03-14 ENCOUNTER — Other Ambulatory Visit (INDEPENDENT_AMBULATORY_CARE_PROVIDER_SITE_OTHER): Payer: Commercial Managed Care - HMO

## 2022-03-14 DIAGNOSIS — E1165 Type 2 diabetes mellitus with hyperglycemia: Secondary | ICD-10-CM | POA: Diagnosis not present

## 2022-03-15 LAB — HEMOGLOBIN A1C: Hgb A1c MFr Bld: 8.2 % — ABNORMAL HIGH (ref 4.6–6.5)

## 2022-03-17 ENCOUNTER — Other Ambulatory Visit: Payer: BLUE CROSS/BLUE SHIELD

## 2022-03-18 ENCOUNTER — Other Ambulatory Visit: Payer: Self-pay | Admitting: Family Medicine

## 2022-03-18 DIAGNOSIS — E785 Hyperlipidemia, unspecified: Secondary | ICD-10-CM

## 2022-03-18 DIAGNOSIS — E1165 Type 2 diabetes mellitus with hyperglycemia: Secondary | ICD-10-CM

## 2022-03-20 ENCOUNTER — Encounter: Payer: Self-pay | Admitting: Interventional Cardiology

## 2022-03-20 ENCOUNTER — Other Ambulatory Visit: Payer: Self-pay

## 2022-03-20 ENCOUNTER — Encounter: Payer: Self-pay | Admitting: Family Medicine

## 2022-03-20 DIAGNOSIS — E1165 Type 2 diabetes mellitus with hyperglycemia: Secondary | ICD-10-CM

## 2022-03-20 MED ORDER — TICAGRELOR 60 MG PO TABS: 60.0000 mg | ORAL_TABLET | Freq: Two times a day (BID) | ORAL | 2 refills | Status: DC

## 2022-03-21 ENCOUNTER — Other Ambulatory Visit: Payer: Self-pay | Admitting: *Deleted

## 2022-03-21 MED ORDER — SEMAGLUTIDE(0.25 OR 0.5MG/DOS) 2 MG/3ML ~~LOC~~ SOPN: 0.2500 mg | PEN_INJECTOR | SUBCUTANEOUS | 1 refills | Status: DC

## 2022-03-22 ENCOUNTER — Encounter: Payer: Self-pay | Admitting: Interventional Cardiology

## 2022-03-22 DIAGNOSIS — I10 Essential (primary) hypertension: Secondary | ICD-10-CM

## 2022-03-24 MED ORDER — NOVOLIN 70/30 FLEXPEN (70-30) 100 UNIT/ML ~~LOC~~ SUPN: 26.0000 [IU] | PEN_INJECTOR | Freq: Two times a day (BID) | SUBCUTANEOUS | 3 refills | Status: DC

## 2022-03-27 ENCOUNTER — Telehealth: Payer: Self-pay | Admitting: Family Medicine

## 2022-03-27 MED ORDER — OZEMPIC (0.25 OR 0.5 MG/DOSE) 2 MG/3ML ~~LOC~~ SOPN: PEN_INJECTOR | SUBCUTANEOUS | 3 refills | Status: DC

## 2022-03-27 MED ORDER — TICAGRELOR 60 MG PO TABS: 60.0000 mg | ORAL_TABLET | Freq: Two times a day (BID) | ORAL | 2 refills | Status: DC

## 2022-03-27 MED ORDER — TRULICITY 0.75 MG/0.5ML ~~LOC~~ SOAJ: 0.7500 mg | SUBCUTANEOUS | 1 refills | Status: DC

## 2022-03-27 NOTE — Telephone Encounter (Signed)
Ozempic is not covered by [employer]. Preferred alternatives:   BYETTA INJECT/PEN .  TRULICITY SD PEN 0.5ML 4'S 0. 0.75MG   TRULICITY SD PEN 0.5ML 4'S 1. 1.5MG   BYDUREON BCISE AUTOINJECTR 0. 2MG   TRULICITY SD PEN 0.5ML 4'S 3MG 

## 2022-03-27 NOTE — Addendum Note (Signed)
Addended byConrad [place] D on: 03/27/2022 10:48 AM   Modules accepted: Orders

## 2022-03-27 NOTE — Telephone Encounter (Signed)
wne Irish Elders, DO  You; Roxanne Gates, CMA Just now (4:05 PM)    Trulicity 0.75 mg [place] q week

## 2022-03-31 MED ORDER — TICAGRELOR 60 MG PO TABS: 60.0000 mg | ORAL_TABLET | Freq: Two times a day (BID) | ORAL | 2 refills | Status: DC

## 2022-03-31 NOTE — Addendum Note (Signed)
Addended by: Dossie Arbour on: 03/31/2022 03:18 PM   Modules accepted: Orders

## 2022-04-03 ENCOUNTER — Other Ambulatory Visit: Payer: Self-pay

## 2022-04-03 DIAGNOSIS — I10 Essential (primary) hypertension: Secondary | ICD-10-CM

## 2022-04-04 MED ORDER — METOPROLOL TARTRATE 100 MG PO TABS: ORAL_TABLET | ORAL | 1 refills | Status: DC

## 2022-04-04 NOTE — Addendum Note (Signed)
Addended by: Shelby Dubin on: 04/04/2022 09:08 AM   Modules accepted: Orders

## 2022-05-15 ENCOUNTER — Other Ambulatory Visit: Payer: Self-pay | Admitting: Family Medicine

## 2022-05-15 DIAGNOSIS — E1165 Type 2 diabetes mellitus with hyperglycemia: Secondary | ICD-10-CM

## 2022-06-01 LAB — HM MAMMOGRAPHY

## 2022-06-05 ENCOUNTER — Encounter: Payer: Self-pay | Admitting: Family Medicine

## 2022-06-12 ENCOUNTER — Other Ambulatory Visit (INDEPENDENT_AMBULATORY_CARE_PROVIDER_SITE_OTHER): Payer: Commercial Managed Care - HMO

## 2022-06-12 ENCOUNTER — Other Ambulatory Visit: Payer: Self-pay | Admitting: Family Medicine

## 2022-06-12 DIAGNOSIS — E1165 Type 2 diabetes mellitus with hyperglycemia: Secondary | ICD-10-CM

## 2022-06-12 DIAGNOSIS — E785 Hyperlipidemia, unspecified: Secondary | ICD-10-CM | POA: Diagnosis not present

## 2022-06-12 LAB — CBC WITH DIFFERENTIAL/PLATELET
Basophils Absolute: 0 10*3/uL (ref 0.0–0.1)
Basophils Relative: 0.5 % (ref 0.0–3.0)
Eosinophils Absolute: 0.1 10*3/uL (ref 0.0–0.7)
Eosinophils Relative: 1.8 % (ref 0.0–5.0)
HCT: 42.1 % (ref 36.0–46.0)
Hemoglobin: 13.6 g/dL (ref 12.0–15.0)
Lymphocytes Relative: 36.1 % (ref 12.0–46.0)
Lymphs Abs: 2.5 10*3/uL (ref 0.7–4.0)
MCHC: 32.4 g/dL (ref 30.0–36.0)
MCV: 76 fl — ABNORMAL LOW (ref 78.0–100.0)
Monocytes Absolute: 0.5 10*3/uL (ref 0.1–1.0)
Monocytes Relative: 7.2 % (ref 3.0–12.0)
Neutro Abs: 3.8 10*3/uL (ref 1.4–7.7)
Neutrophils Relative %: 54.4 % (ref 43.0–77.0)
Platelets: 245 10*3/uL (ref 150.0–400.0)
RBC: 5.54 Mil/uL — ABNORMAL HIGH (ref 3.87–5.11)
RDW: 15.1 % (ref 11.5–15.5)
WBC: 7 10*3/uL (ref 4.0–10.5)

## 2022-06-12 LAB — COMPREHENSIVE METABOLIC PANEL
ALT: 31 U/L (ref 0–35)
AST: 24 U/L (ref 0–37)
Albumin: 4.4 g/dL (ref 3.5–5.2)
Alkaline Phosphatase: 76 U/L (ref 39–117)
BUN: 11 mg/dL (ref 6–23)
CO2: 27 mEq/L (ref 19–32)
Calcium: 9.7 mg/dL (ref 8.4–10.5)
Chloride: 104 mEq/L (ref 96–112)
Creatinine, Ser: 0.89 mg/dL (ref 0.40–1.20)
GFR: 71.3 mL/min (ref >60.00)
Glucose, Bld: 95 mg/dL (ref 70–99)
Potassium: 4 mEq/L (ref 3.5–5.1)
Sodium: 141 mEq/L (ref 135–145)
Total Bilirubin: 0.3 mg/dL (ref 0.2–1.2)
Total Protein: 7.2 g/dL (ref 6.0–8.3)

## 2022-06-12 LAB — HEMOGLOBIN A1C: Hgb A1c MFr Bld: 6.7 % — ABNORMAL HIGH (ref 4.6–6.5)

## 2022-06-12 LAB — LIPID PANEL
Cholesterol: 99 mg/dL (ref 0–200)
HDL: 39 mg/dL — ABNORMAL LOW (ref >39.00)
LDL Cholesterol: 44 mg/dL (ref 0–99)
NonHDL: 60.13
Total CHOL/HDL Ratio: 3
Triglycerides: 82 mg/dL (ref 0.0–149.0)
VLDL: 16.4 mg/dL (ref 0.0–40.0)

## 2022-06-12 LAB — MICROALBUMIN / CREATININE URINE RATIO
Creatinine,U: 49 mg/dL
Microalb Creat Ratio: 4 mg/g (ref 0.0–30.0)
Microalb, Ur: 1.9 mg/dL (ref 0.0–1.9)

## 2022-06-12 NOTE — Telephone Encounter (Signed)
Labs today, awaiting results

## 2022-06-13 ENCOUNTER — Encounter: Payer: Self-pay | Admitting: Family Medicine

## 2022-06-17 ENCOUNTER — Other Ambulatory Visit: Payer: Self-pay | Admitting: Family Medicine

## 2022-07-01 ENCOUNTER — Other Ambulatory Visit: Payer: Self-pay | Admitting: Family Medicine

## 2022-07-01 DIAGNOSIS — E1165 Type 2 diabetes mellitus with hyperglycemia: Secondary | ICD-10-CM

## 2022-08-02 ENCOUNTER — Other Ambulatory Visit: Payer: Self-pay | Admitting: Family Medicine

## 2022-08-25 ENCOUNTER — Encounter: Payer: Self-pay | Admitting: Family Medicine

## 2022-08-25 ENCOUNTER — Encounter: Payer: Self-pay | Admitting: Interventional Cardiology

## 2022-08-25 DIAGNOSIS — I10 Essential (primary) hypertension: Secondary | ICD-10-CM

## 2022-08-25 DIAGNOSIS — Z79899 Other long term (current) drug therapy: Secondary | ICD-10-CM

## 2022-09-05 MED ORDER — SPIRONOLACTONE 25 MG PO TABS: 25.0000 mg | ORAL_TABLET | Freq: Every day | ORAL | 3 refills | Status: DC

## 2022-09-05 NOTE — Telephone Encounter (Signed)
Patient called office. Informed her of Dr. Hassell Done advisement.  Patient was agreeable to plan. Patient will come in on next Thursday for lab work. Patient will give our office a call if she has any issues with spironolactone. Patient stated she had a reaction to lisinopril, lips were swollen, hives on her chest, breast, and abdomen, and she had itching from head to toe. Put lisinopril on patient's allergy list and took it off her medication list.

## 2022-09-07 ENCOUNTER — Telehealth: Payer: Self-pay | Admitting: Interventional Cardiology

## 2022-09-07 NOTE — Telephone Encounter (Signed)
  Via MyChart Scheduling message:   The new prescription Spironolactone sides effects are worse than the Lisinopril, I'll just continue the Metoprolol and Amlodipine Thank you

## 2022-09-08 NOTE — Telephone Encounter (Addendum)
Reviewed with Dr  Irish Lack and patient is on maximum dose Amlodipine so cannot increase this.   Per Dr Irish Lack patient can take amlodipine 5 mg twice daily if she would like

## 2022-09-08 NOTE — Telephone Encounter (Signed)
See mychart messages

## 2022-09-08 NOTE — Telephone Encounter (Signed)
We could try hydralazine 25 mg BID for BP control.  It could be increased to TID if BP remains elevated.  JV

## 2022-09-09 ENCOUNTER — Other Ambulatory Visit: Payer: Self-pay | Admitting: Interventional Cardiology

## 2022-09-09 DIAGNOSIS — I10 Essential (primary) hypertension: Secondary | ICD-10-CM

## 2022-09-10 ENCOUNTER — Other Ambulatory Visit: Payer: Self-pay | Admitting: Family Medicine

## 2022-09-10 DIAGNOSIS — E1165 Type 2 diabetes mellitus with hyperglycemia: Secondary | ICD-10-CM

## 2022-09-14 ENCOUNTER — Other Ambulatory Visit: Payer: Commercial Managed Care - HMO

## 2022-10-11 ENCOUNTER — Encounter: Payer: Self-pay | Admitting: Interventional Cardiology

## 2022-10-11 NOTE — Telephone Encounter (Signed)
Two weeks of Brilinta samples left at front desk for patient

## 2022-10-12 ENCOUNTER — Ambulatory Visit: Payer: BLUE CROSS/BLUE SHIELD | Admitting: Physician Assistant

## 2022-10-16 NOTE — Telephone Encounter (Signed)
I spoke with [facility] and the processor is down and they cannot run co pay card.  Prior to processor issue patient had been using co pay card and picking up Brilinta every month. [facility] does not know when processor issue will be resolved.

## 2022-10-16 NOTE — Telephone Encounter (Signed)
Reviewed with pharmacist in office and many copay cards are still not working . OK to give another 2 weeks of samples and have patient continue to check with pharmacy to see if they can run co pay cards.  Not sure when the issue will be resolved.  Two weeks of Brilinta 60 mg tablets left at front desk for patient

## 2022-10-18 NOTE — Progress Notes (Deleted)
Office Visit    Patient Name: Danielle Flores Date of Encounter: 10/18/2022  PCP:  Lowne Chase, [facility]  Cardiologist:  Larae Grooms, MD  Advanced Practice Provider:  No care team member to display Electrophysiologist:  None   HPI    Danielle Flores is a 57 y.o. 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  Cardiac catheterization 09/2018 for NSTEMI showed proximal to mid RCA with 25% stenosis, proximal circumflex to mid circumflex 25% stenosis, ostial LAD lesion 25% stenosis, R PAD lesion 95% stenosis, a drug-eluting stent was successfully placed using a stent Synergy DES 2.5 x 16 (0% residual stenosis postintervention), LVEF 65%.  Recommendation to continue dual antiplatelet therapy.  BP at home have been elevated and lisinopril was increased.  Encouraged to continue healthy diet and exercise program.  Had DKA in the past with dehydration.  Avoiding diuretics.  She needed cardiology clearance for her colonoscopy planned the following month.  Walks regularly for exercise.  Today, she***  Past Medical History    Past Medical History:  Diagnosis Date   Hyperglycemia    Hyperlipidemia    Hypertension    Past Surgical History:  Procedure Laterality Date   CESAREAN SECTION     CORONARY STENT INTERVENTION N/A 09/16/2018   Procedure: CORONARY STENT INTERVENTION;  Surgeon: Jettie Booze, MD;  Location: [facility];  Service: Cardiovascular;  Laterality: N/A;   LEFT HEART CATH AND CORONARY ANGIOGRAPHY N/A 09/16/2018   Procedure: LEFT HEART CATH AND CORONARY ANGIOGRAPHY;  Surgeon: Jettie Booze, MD;  Location: [facility];  Service: Cardiovascular;  Laterality: N/A;    Allergies  Allergies  Allergen Reactions   Lisinopril Hives, Itching and Swelling    Lip swelling,  itching from head to toe, hives chest, breast, and abdomen.   Atorvastatin Other (See Comments)    Myalgias    EKGs/Labs/Other Studies Reviewed:   The following studies were reviewed today:  Cardiac catheterization 09/16/2018 Left Anterior Descending  Ost LAD lesion is 25% stenosed.    Left Circumflex  Prox Cx to Mid Cx lesion is 25% stenosed.    Right Coronary Artery  Prox RCA to Mid RCA lesion is 25% stenosed.    Right Posterior Descending Artery  RPDA lesion is 95% stenosed. Vessel is the culprit lesion. The lesion is focal, eccentric and ulcerative.    Intervention   RPDA lesion  Stent  CATHETER LAUNCHER 6FR JR4 guide catheter was inserted. Lesion crossed with guidewire using a WIRE ASAHI PROWATER 180CM. Pre-stent angioplasty was performed using a BALLOON SAPPHIRE 2.0X15. A drug-eluting stent was successfully placed using a STENT SYNERGY DES 2.5X16. Stent strut is well apposed. Post-stent angioplasty was performed using a BALLOON SAPPHIRE [place] 2.75X10. Maximum pressure: 18 atm.  Post-Intervention Lesion Assessment  The intervention was successful. Pre-interventional TIMI flow is 2. Post-intervention TIMI flow is 3. No complications occurred at this lesion.  There is a 0% residual stenosis post intervention.     Wall Motion  Resting                Left Heart  Left Ventricle The left ventricular size is normal. There is hyperdynamic left ventricular systolic function. LV end diastolic pressure is normal. The left ventricular ejection fraction is greater than 65% by visual estimate. No regional  wall motion abnormalities.  Aortic Valve There is no aortic valve stenosis.   Coronary Diagrams  Diagnostic Dominance: Right  Intervention    Implants    EKG:  EKG is *** ordered today.  The ekg ordered today demonstrates ***  Recent Labs: 06/12/2022: ALT 31; BUN 11; Creatinine, Ser 0.89; Hemoglobin 13.6; Platelets 245.0; Potassium 4.0; Sodium 141  Recent Lipid Panel     Component Value Date/Time   CHOL 99 06/12/2022 0752   CHOL 124 10/04/2020 0854   TRIG 82.0 06/12/2022 0752   HDL 39.00 (L) 06/12/2022 0752   HDL 42 10/04/2020 0854   CHOLHDL 3 06/12/2022 0752   VLDL 16.4 06/12/2022 0752   LDLCALC 44 06/12/2022 0752   LDLCALC 62 10/04/2020 0854   LDLDIRECT 131.0 01/08/2020 1342    Risk Assessment/Calculations:  {Does this patient have ATRIAL FIBRILLATION?[phone]}  Home Medications   No outpatient medications have been marked as taking for the 10/19/22 encounter (Appointment) with Elgie Collard, PA-C.     Review of Systems   ***   All other systems reviewed and are otherwise negative except as noted above.  Physical Exam    VS:  LMP  (LMP Unknown)  , BMI There is no height or weight on file to calculate BMI.  Wt Readings from Last 3 Encounters:  12/05/21 204 lb 12.8 oz (92.9 kg)  10/18/21 202 lb (91.6 kg)  05/02/21 214 lb 12.8 oz (97.4 kg)     GEN: Well nourished, well developed, in no acute distress. HEENT: normal. Neck: Supple, no JVD, carotid bruits, or masses. Cardiac: ***RRR, no murmurs, rubs, or gallops. No clubbing, cyanosis, edema.  ***Radials/PT 2+ and equal bilaterally.  Respiratory:  ***Respirations regular and unlabored, clear to auscultation bilaterally. GI: Soft, nontender, nondistended. MS: No deformity or atrophy. Skin: Warm and dry, no rash. Neuro:  Strength and sensation are intact. Psych: Normal affect.  Assessment & Plan    CAD status post PCI Hyperlipidemia Hypertension Diabetes mellitus type 2   No BP recorded.  {Refresh Note OR Click here to enter BP  :1}***      Disposition: Follow up {follow up:15908} with Larae Grooms, MD or APP.  Signed, Elgie Collard, PA-C 10/18/2022, 9:54 PM [facility]

## 2022-10-19 ENCOUNTER — Ambulatory Visit: Payer: BLUE CROSS/BLUE SHIELD | Admitting: Physician Assistant

## 2022-10-19 DIAGNOSIS — E785 Hyperlipidemia, unspecified: Secondary | ICD-10-CM

## 2022-10-19 DIAGNOSIS — I119 Hypertensive heart disease without heart failure: Secondary | ICD-10-CM

## 2022-10-19 DIAGNOSIS — I2510 Atherosclerotic heart disease of native coronary artery without angina pectoris: Secondary | ICD-10-CM

## 2022-10-19 DIAGNOSIS — Z79899 Other long term (current) drug therapy: Secondary | ICD-10-CM

## 2022-10-19 DIAGNOSIS — E1110 Type 2 diabetes mellitus with ketoacidosis without coma: Secondary | ICD-10-CM

## 2022-10-19 DIAGNOSIS — I10 Essential (primary) hypertension: Secondary | ICD-10-CM

## 2022-10-19 DIAGNOSIS — I252 Old myocardial infarction: Secondary | ICD-10-CM

## 2022-10-24 ENCOUNTER — Other Ambulatory Visit: Payer: Self-pay | Admitting: Interventional Cardiology

## 2022-10-25 NOTE — Telephone Encounter (Signed)
Please keep upcoming appt for future refills. Thank you  °

## 2022-11-08 NOTE — Progress Notes (Addendum)
Office Visit    Patient Name: Danielle Flores Date of Encounter: 11/09/2022  PCP:  Lowne Chase, [facility]  Cardiologist:  Larae Grooms, MD  Advanced Practice Provider:  No care team member to display Electrophysiologist:  None   HPI    Danielle Flores is a 57 y.o. 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  Cardiac catheterization 09/2018 for NSTEMI showed proximal to mid RCA with 25% stenosis, proximal circumflex to mid circumflex 25% stenosis, ostial LAD lesion 25% stenosis, R PDA lesion 95% stenosis, a drug-eluting stent was successfully placed using a stent Synergy DES 2.5 x 16 (0% residual stenosis postintervention), LVEF 65%.  Recommendation to continue dual antiplatelet therapy.  BP at home have been elevated and lisinopril was increased.  Encouraged to continue healthy diet and exercise program.  Had DKA in the past with dehydration.  Avoiding diuretics.  She needed cardiology clearance for her colonoscopy planned the following month.  Walks regularly for exercise.  Today, she is doing well.  Her biggest concern is the cost of her Brilinta.  It went from $15 a month to 500.  She went to the hospital in 2020 because she passed out and was found to have an NSTEMI.  She never had chest pain.  She does not have chest pain or shortness of breath.  She works as a Engineer, site. She is on crestor 10mg  daily and LDL is 44.   Reports no shortness of breath nor dyspnea on exertion. Reports no chest pain, pressure, or tightness. No edema, orthopnea, PND. Reports no palpitations.   Past Medical History    Past Medical History:  Diagnosis Date   Hyperglycemia    Hyperlipidemia    Hypertension    Past Surgical History:  Procedure Laterality Date   CESAREAN SECTION     CORONARY STENT  INTERVENTION N/A 09/16/2018   Procedure: CORONARY STENT INTERVENTION;  Surgeon: Jettie Booze, MD;  Location: [facility];  Service: Cardiovascular;  Laterality: N/A;   LEFT HEART CATH AND CORONARY ANGIOGRAPHY N/A 09/16/2018   Procedure: LEFT HEART CATH AND CORONARY ANGIOGRAPHY;  Surgeon: Jettie Booze, MD;  Location: [facility];  Service: Cardiovascular;  Laterality: N/A;    Allergies  Allergies  Allergen Reactions   Lisinopril Hives, Itching and Swelling    Lip swelling, itching from head to toe, hives chest, breast, and abdomen.   Atorvastatin Other (See Comments)    Myalgias    EKGs/Labs/Other Studies Reviewed:   The following studies were reviewed today:  Cardiac catheterization 09/16/2018 Left Anterior Descending  Ost LAD lesion is 25% stenosed.    Left Circumflex  Prox Cx to Mid Cx lesion is 25% stenosed.    Right Coronary Artery  Prox RCA to Mid RCA lesion is 25% stenosed.    Right Posterior Descending Artery  RPDA lesion is 95% stenosed. Vessel is the culprit lesion. The lesion is focal, eccentric and ulcerative.    Intervention   RPDA lesion  Stent  CATHETER LAUNCHER 6FR JR4 guide catheter was inserted. Lesion crossed with guidewire using a WIRE ASAHI PROWATER 180CM. Pre-stent angioplasty was performed using a BALLOON SAPPHIRE 2.0X15. A drug-eluting stent was successfully placed using a STENT SYNERGY DES 2.5X16. Stent strut is well apposed. Post-stent angioplasty was performed  using a BALLOON SAPPHIRE [place] 2.75X10. Maximum pressure: 18 atm.  Post-Intervention Lesion Assessment  The intervention was successful. Pre-interventional TIMI flow is 2. Post-intervention TIMI flow is 3. No complications occurred at this lesion.  There is a 0% residual stenosis post intervention.     Wall Motion  Resting                Left Heart  Left Ventricle The left ventricular size is normal. There is hyperdynamic left ventricular systolic function.  LV end diastolic pressure is normal. The left ventricular ejection fraction is greater than 65% by visual estimate. No regional wall motion abnormalities.  Aortic Valve There is no aortic valve stenosis.   Coronary Diagrams  Diagnostic Dominance: Right  Intervention    Implants    EKG:  EKG is  ordered today.  The ekg ordered today demonstrates sinus rhythm, rate 63 bpm. Area of T wve abnormality in lead 3, different than previous EKGs.  Recent Labs: 06/12/2022: ALT 31; BUN 11; Creatinine, Ser 0.89; Hemoglobin 13.6; Platelets 245.0; Potassium 4.0; Sodium 141  Recent Lipid Panel    Component Value Date/Time   CHOL 99 06/12/2022 0752   CHOL 124 10/04/2020 0854   TRIG 82.0 06/12/2022 0752   HDL 39.00 (L) 06/12/2022 0752   HDL 42 10/04/2020 0854   CHOLHDL 3 06/12/2022 0752   VLDL 16.4 06/12/2022 0752   LDLCALC 44 06/12/2022 0752   LDLCALC 62 10/04/2020 0854   LDLDIRECT 131.0 01/08/2020 1342     Home Medications   Current Meds  Medication Sig   clopidogrel (PLAVIX) 300 MG TABS tablet Take 1 tablet (300 mg total) by mouth once for 1 dose. LOADING DOSE NEW START PLAVIX   clopidogrel (PLAVIX) 75 MG tablet Take 1 tablet (75 mg total) by mouth daily.   ticagrelor (BRILINTA) 60 MG TABS tablet Take 1 tablet (60 mg total) by mouth 2 (two) times daily.     Review of Systems      All other systems reviewed and are otherwise negative except as noted above.  Physical Exam    VS:  BP (!) 120/90   Pulse 63   Ht 5\' 8"  (1.727 m)   Wt 210 lb 3.2 oz (95.3 kg)   LMP  (LMP Unknown)   SpO2 96%   BMI 31.96 kg/m  , BMI Body mass index is 31.96 kg/m.  Wt Readings from Last 3 Encounters:  11/09/22 210 lb 3.2 oz (95.3 kg)  12/05/21 204 lb 12.8 oz (92.9 kg)  10/18/21 202 lb (91.6 kg)     GEN: Well nourished, well developed, in no acute distress. HEENT: normal. Neck: Supple, no JVD, carotid bruits, or masses. Cardiac: RRR, no murmurs, rubs, or gallops. No clubbing, cyanosis, edema.   Radials/PT 2+ and equal bilaterally.  Respiratory:  Respirations regular and unlabored, clear to auscultation bilaterally. GI: Soft, nontender, nondistended. MS: No deformity or atrophy. Skin: Warm and dry, no rash. Neuro:  Strength and sensation are intact. Psych: Normal affect.  Assessment & Plan    CAD status post PCI -no chest pains or SOB -Continue current medications including amlodipine 10 mg daily, aspirin 81 mg daily, Zetia 10 mg daily, Lopressor 100 mg twice a day, Crestor 20 mg daily, Brilinta 60 mg twice a day (due to the cost of this medication, we have switched her to Plavix) -She will start Plavix 24 hours after her Brilinta and we have given her a 300 mg tablet to take as a load and then  75 mg thereafter -Treadmill stress to evaluate EKG changes  Hyperlipidemia -Continue current medications -LDL is at goal 44, HDL 39, total cholesterol 99, triglycerides 82   Hypertension -Well-controlled blood pressure today -Continue current medications -Continue low-sodium diet -Continue to monitor blood pressure at home  Diabetes mellitus type 2 -A1c is 6.7 -continue to monitor BP closely    Disposition: Follow up 1 year with Lance Muss, MD or APP.  Signed, Sharlene Dory, PA-C 11/09/2022, 5:19 PM [facility]

## 2022-11-09 ENCOUNTER — Ambulatory Visit: Payer: BLUE CROSS/BLUE SHIELD | Attending: Physician Assistant | Admitting: Physician Assistant

## 2022-11-09 ENCOUNTER — Encounter: Payer: Self-pay | Admitting: Physician Assistant

## 2022-11-09 VITALS — BP 120/90 | HR 63 | Ht 68.0 in | Wt 210.2 lb

## 2022-11-09 DIAGNOSIS — I10 Essential (primary) hypertension: Secondary | ICD-10-CM

## 2022-11-09 DIAGNOSIS — E1110 Type 2 diabetes mellitus with ketoacidosis without coma: Secondary | ICD-10-CM

## 2022-11-09 DIAGNOSIS — E785 Hyperlipidemia, unspecified: Secondary | ICD-10-CM

## 2022-11-09 DIAGNOSIS — I2510 Atherosclerotic heart disease of native coronary artery without angina pectoris: Secondary | ICD-10-CM | POA: Diagnosis not present

## 2022-11-09 MED ORDER — CLOPIDOGREL BISULFATE 75 MG PO TABS: 75.0000 mg | ORAL_TABLET | Freq: Every day | ORAL | 3 refills | Status: DC

## 2022-11-09 MED ORDER — TICAGRELOR 60 MG PO TABS: 60.0000 mg | ORAL_TABLET | Freq: Two times a day (BID) | ORAL | 0 refills | Status: DC

## 2022-11-09 MED ORDER — CLOPIDOGREL BISULFATE 300 MG PO TABS: 300.0000 mg | ORAL_TABLET | Freq: Once | ORAL | 0 refills | Status: AC

## 2022-11-09 NOTE — Patient Instructions (Addendum)
  Medication Instructions:   AFTER YOU ARE DONE WITH BRILINTA SAMPLES.  STOP TAKING:  BRILINTA   WAIT A WHOLE 24 HOURS   START TAKING:  PLAVIX  75 MG ONCE A DAY   FIRST DOSE 300 MG   2.  FOLLOWING DAY  START TAKING  75 MG ONCE A DAY    *If you need a refill on your cardiac medications before your next appointment, please call your pharmacy*   Lab Work:  NONE ORDERED  TODAY    If you have labs (blood work) drawn today and your tests are completely normal, you will receive your results only by: [place] (if you have MyChart) OR A paper copy in the mail If you have any lab test that is abnormal or we need to change your treatment, we will call you to review the results.   Testing/Procedures: NONE ORDERED  TODAY    Follow-Up: At [facility], you and your health needs are our priority.  As part of our continuing mission to provide you with exceptional heart care, we have created designated Provider Care Teams.  These Care Teams include your primary Cardiologist (physician) and Advanced Practice Providers (APPs -  Physician Assistants and Nurse Practitioners) who all work together to provide you with the care you need, when you need it.  We recommend signing up for the patient portal called "MyChart".  Sign up information is provided on this After Visit Summary.  MyChart is used to connect with patients for Virtual Visits (Telemedicine).  Patients are able to view lab/test results, encounter notes, upcoming appointments, etc.  Non-urgent messages can be sent to your provider as well.   To learn more about what you can do with MyChart, go to [email or web address].    Your next appointment:   1 year(s)  Provider:   Larae Grooms, MD     Other Instructions

## 2022-11-12 ENCOUNTER — Other Ambulatory Visit: Payer: Self-pay | Admitting: Interventional Cardiology

## 2022-11-12 DIAGNOSIS — I2510 Atherosclerotic heart disease of native coronary artery without angina pectoris: Secondary | ICD-10-CM

## 2022-11-13 NOTE — Telephone Encounter (Signed)
Rx refill sent to pharmacy. 

## 2022-11-15 ENCOUNTER — Telehealth: Payer: Self-pay | Admitting: *Deleted

## 2022-11-15 ENCOUNTER — Encounter: Payer: Self-pay | Admitting: *Deleted

## 2022-11-15 ENCOUNTER — Other Ambulatory Visit: Payer: Self-pay | Admitting: *Deleted

## 2022-11-15 DIAGNOSIS — I2510 Atherosclerotic heart disease of native coronary artery without angina pectoris: Secondary | ICD-10-CM

## 2022-11-15 DIAGNOSIS — R079 Chest pain, unspecified: Secondary | ICD-10-CM

## 2022-11-15 NOTE — Telephone Encounter (Signed)
-----   Message from Sharlene Dory, [place] sent at 11/15/2022 10:05 AM EDT ----- CAD ----- Message ----- From: Valrie Hart, CMA Sent: 11/14/2022   5:27 PM EDT To: Sharlene Dory, PA-C  What would the dx be? ----- Message ----- From: Sharlene Dory, PA-C Sent: 11/10/2022   1:04 PM EDT To: Malosi Hemstreet Chyrl Civatte, CMA  Hey Arma Reining,   Can you help me set up a tredmill stress test for her?  Thanks! Sharlene Dory, PA-C

## 2022-11-15 NOTE — Telephone Encounter (Signed)
Spoke with patient and she agrees to proceed. Order has been placed. She is aware that I will send instructions to her via mychart and she will call and let us know if she has any further questions or concerns.

## 2022-11-17 ENCOUNTER — Other Ambulatory Visit: Payer: Self-pay | Admitting: Interventional Cardiology

## 2022-11-17 ENCOUNTER — Encounter (HOSPITAL_COMMUNITY): Payer: Self-pay

## 2022-11-20 ENCOUNTER — Encounter (HOSPITAL_COMMUNITY): Payer: BLUE CROSS/BLUE SHIELD

## 2022-11-29 ENCOUNTER — Other Ambulatory Visit: Payer: Self-pay | Admitting: Interventional Cardiology

## 2022-12-08 ENCOUNTER — Encounter: Payer: Self-pay | Admitting: Family Medicine

## 2022-12-12 ENCOUNTER — Other Ambulatory Visit: Payer: Self-pay | Admitting: Interventional Cardiology

## 2022-12-12 DIAGNOSIS — I10 Essential (primary) hypertension: Secondary | ICD-10-CM

## 2022-12-26 ENCOUNTER — Encounter: Payer: Self-pay | Admitting: Interventional Cardiology

## 2023-01-09 ENCOUNTER — Other Ambulatory Visit (INDEPENDENT_AMBULATORY_CARE_PROVIDER_SITE_OTHER): Payer: BLUE CROSS/BLUE SHIELD

## 2023-01-09 ENCOUNTER — Other Ambulatory Visit: Payer: Self-pay | Admitting: Family Medicine

## 2023-01-09 DIAGNOSIS — E785 Hyperlipidemia, unspecified: Secondary | ICD-10-CM

## 2023-01-09 DIAGNOSIS — E1165 Type 2 diabetes mellitus with hyperglycemia: Secondary | ICD-10-CM

## 2023-01-09 DIAGNOSIS — I10 Essential (primary) hypertension: Secondary | ICD-10-CM

## 2023-01-09 DIAGNOSIS — Z7984 Long term (current) use of oral hypoglycemic drugs: Secondary | ICD-10-CM | POA: Diagnosis not present

## 2023-01-09 LAB — CBC WITH DIFFERENTIAL/PLATELET
Basophils Absolute: 0 10*3/uL (ref 0.0–0.1)
Basophils Relative: 1 % (ref 0.0–3.0)
Eosinophils Absolute: 0.2 10*3/uL (ref 0.0–0.7)
Eosinophils Relative: 3.4 % (ref 0.0–5.0)
HCT: 41.5 % (ref 36.0–46.0)
Hemoglobin: 13.2 g/dL (ref 12.0–15.0)
Lymphocytes Relative: 42.7 % (ref 12.0–46.0)
Lymphs Abs: 2.2 10*3/uL (ref 0.7–4.0)
MCHC: 31.8 g/dL (ref 30.0–36.0)
MCV: 75.7 fl — ABNORMAL LOW (ref 78.0–100.0)
Monocytes Absolute: 0.6 10*3/uL (ref 0.1–1.0)
Monocytes Relative: 10.9 % (ref 3.0–12.0)
Neutro Abs: 2.2 10*3/uL (ref 1.4–7.7)
Neutrophils Relative %: 42 % — ABNORMAL LOW (ref 43.0–77.0)
Platelets: 263 10*3/uL (ref 150.0–400.0)
RBC: 5.48 Mil/uL — ABNORMAL HIGH (ref 3.87–5.11)
RDW: 15 % (ref 11.5–15.5)
WBC: 5.2 10*3/uL (ref 4.0–10.5)

## 2023-01-09 LAB — COMPREHENSIVE METABOLIC PANEL
ALT: 51 U/L — ABNORMAL HIGH (ref 0–35)
AST: 35 U/L (ref 0–37)
Albumin: 4.2 g/dL (ref 3.5–5.2)
Alkaline Phosphatase: 70 U/L (ref 39–117)
BUN: 11 mg/dL (ref 6–23)
CO2: 29 mEq/L (ref 19–32)
Calcium: 9.1 mg/dL (ref 8.4–10.5)
Chloride: 100 mEq/L (ref 96–112)
Creatinine, Ser: 0.83 mg/dL (ref 0.40–1.20)
GFR: 77.22 mL/min (ref >60.00)
Glucose, Bld: 187 mg/dL — ABNORMAL HIGH (ref 70–99)
Potassium: 3.9 mEq/L (ref 3.5–5.1)
Sodium: 139 mEq/L (ref 135–145)
Total Bilirubin: 0.3 mg/dL (ref 0.2–1.2)
Total Protein: 7.4 g/dL (ref 6.0–8.3)

## 2023-01-09 LAB — LIPID PANEL
Cholesterol: 196 mg/dL (ref 0–200)
HDL: 42.5 mg/dL (ref >39.00)
LDL Cholesterol: 121 mg/dL — ABNORMAL HIGH (ref 0–99)
NonHDL: 153.87
Total CHOL/HDL Ratio: 5
Triglycerides: 166 mg/dL — ABNORMAL HIGH (ref 0.0–149.0)
VLDL: 33.2 mg/dL (ref 0.0–40.0)

## 2023-01-09 LAB — HEMOGLOBIN A1C: Hgb A1c MFr Bld: 7.8 % — ABNORMAL HIGH (ref 4.6–6.5)

## 2023-01-10 NOTE — Telephone Encounter (Signed)
If BP is still elevated, will need an additional medicine as she appears to be on amlodipine 10 mg daily already.    Could try spironolactone 25 mg daily.  BMet in one week after adding new med.  Cannot use ACE-I as she has an allergy listed.

## 2023-01-11 NOTE — Telephone Encounter (Signed)
Called the patient. She told me since she started the most recent prescription of Lopressor is when her BP increased.  The most recent refill was 5/724.  The pill is different and she thought the doctor changed it.  She does not want to take another medication.  She wants the pink round metoprolol tablet back.  She said her BP was well controlled on that.  [facility] is going to [facility] to ask for them to provide the usual looking metoprolol.   I called [facility] to let them know the patient's concerns and that she is coming by today.

## 2023-01-12 ENCOUNTER — Encounter: Payer: Self-pay | Admitting: Family Medicine

## 2023-01-12 ENCOUNTER — Encounter: Payer: Self-pay | Admitting: Interventional Cardiology

## 2023-01-14 ENCOUNTER — Other Ambulatory Visit: Payer: Self-pay | Admitting: Family Medicine

## 2023-01-15 ENCOUNTER — Encounter: Payer: Self-pay | Admitting: *Deleted

## 2023-01-15 ENCOUNTER — Other Ambulatory Visit: Payer: Self-pay | Admitting: *Deleted

## 2023-01-15 DIAGNOSIS — E1165 Type 2 diabetes mellitus with hyperglycemia: Secondary | ICD-10-CM

## 2023-01-15 MED ORDER — ROSUVASTATIN CALCIUM 40 MG PO TABS: 40.0000 mg | ORAL_TABLET | Freq: Every day | ORAL | 2 refills | Status: DC

## 2023-01-15 MED ORDER — NOVOLIN 70/30 FLEXPEN (70-30) 100 UNIT/ML ~~LOC~~ SUPN
26.0000 [IU] | PEN_INJECTOR | Freq: Two times a day (BID) | SUBCUTANEOUS | 1 refills | Status: DC
Start: 2023-01-15 — End: 2023-03-26

## 2023-01-16 ENCOUNTER — Encounter: Payer: Self-pay | Admitting: Family Medicine

## 2023-01-16 ENCOUNTER — Other Ambulatory Visit: Payer: Self-pay | Admitting: Family Medicine

## 2023-01-16 DIAGNOSIS — E1165 Type 2 diabetes mellitus with hyperglycemia: Secondary | ICD-10-CM

## 2023-03-23 ENCOUNTER — Encounter: Payer: Self-pay | Admitting: Family Medicine

## 2023-03-26 ENCOUNTER — Encounter: Payer: Self-pay | Admitting: Family Medicine

## 2023-03-26 ENCOUNTER — Other Ambulatory Visit: Payer: Self-pay | Admitting: Family Medicine

## 2023-03-26 ENCOUNTER — Ambulatory Visit: Payer: BLUE CROSS/BLUE SHIELD | Admitting: Family Medicine

## 2023-03-26 VITALS — BP 128/80 | HR 67 | Temp 97.7°F | Resp 18 | Ht 68.0 in | Wt 217.6 lb

## 2023-03-26 DIAGNOSIS — Z794 Long term (current) use of insulin: Secondary | ICD-10-CM

## 2023-03-26 DIAGNOSIS — E1165 Type 2 diabetes mellitus with hyperglycemia: Secondary | ICD-10-CM

## 2023-03-26 MED ORDER — EMPAGLIFLOZIN 10 MG PO TABS
10.0000 mg | ORAL_TABLET | Freq: Every day | ORAL | 2 refills | Status: DC
Start: 2023-03-26 — End: 2023-08-13

## 2023-03-26 NOTE — Progress Notes (Signed)
Established Patient Office Visit  Subjective   Patient ID: Danielle Flores, female    DOB: 07/09/1966  Age: 57 y.o. MRN: [id number]  Chief Complaint  Patient presents with   Medication Management    Pt would like to discuss adding Jardiance to her medication regime.    HPI Discussed the use of AI scribe software for clinical note transcription with the patient, who gave verbal consent to proceed.  History of Present Illness   The patient, with a history of diabetes, presents for a follow-up visit. She was due for an A1c check after an insulin increase but forgot to schedule the lab. She reports uncertain improvement in her blood sugars and expresses interest in adding Jardiance to her regimen.  The patient also expresses concern about a potential lupus diagnosis after reviewing her chart and noticing a speckle pattern associated with connective tissue disease. She was referred to rheumatology and underwent further testing, but she does not recall the results. She reports joint pain when it is about to rain, which she attributes to potential arthritis.  The patient also mentions a past issue with a burst blood vessel in her eye, which was treated with injections. She reports no current issues related to this condition.      Patient Active Problem List   Diagnosis Date Noted   Positive ANA (antinuclear antibody) 05/02/2021   Lupus anticoagulant positive 05/02/2021   Pain in left wrist 02/20/2021   Pain in right wrist 02/20/2021   Pain in joint, multiple sites 01/14/2021   Hyperlipidemia associated with type 2 diabetes mellitus (HCC) 01/14/2021   Uncontrolled type 2 diabetes mellitus with hyperglycemia (HCC) 01/14/2021   CAD in native artery/Stent 09/16/2018 09/16/2018   DKA, type 2 (HCC) 09/13/2018   NSTEMI (non-ST elevated myocardial infarction) (HCC) 09/13/2018   DKA (diabetic ketoacidoses) 09/13/2018   Elevated troponin    DM (diabetes mellitus) type II uncontrolled, periph  vascular disorder 08/06/2017   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   GENERALIZED ANXIETY DISORDER 10/13/2010   CARDIAC MURMUR 10/13/2010   CHEST PAIN, HX OF 02/28/2008   ELBOW PAIN, RIGHT 11/01/2007   Hyperlipidemia LDL goal <100 10/14/2007   HYPERGLYCEMIA 10/14/2007   SICKLE CELL TRAIT 01/24/2007   Essential hypertension 01/24/2007   POSTPROCEDURAL STATUS NEC 01/24/2007   Past Medical History:  Diagnosis Date   Hyperglycemia    Hyperlipidemia    Hypertension    Past Surgical History:  Procedure Laterality Date   CESAREAN SECTION     CORONARY STENT INTERVENTION N/A 09/16/2018   Procedure: CORONARY STENT INTERVENTION;  Surgeon: Corky Crafts, MD;  Location: [facility] INVASIVE CV LAB;  Service: Cardiovascular;  Laterality: N/A;   LEFT HEART CATH AND CORONARY ANGIOGRAPHY N/A 09/16/2018   Procedure: LEFT HEART CATH AND CORONARY ANGIOGRAPHY;  Surgeon: Corky Crafts, MD;  Location: [facility] INVASIVE CV LAB;  Service: Cardiovascular;  Laterality: N/A;   Social History   Tobacco Use   Smoking status: Never   Smokeless tobacco: Never  Vaping Use   Vaping status: Never Used  Substance Use Topics   Alcohol use: Yes    Comment: twice montly   Drug use: No   Social History   Socioeconomic History   Marital status: Married    Spouse name: Not on file   Number of children: Not on file   Years of education: Not on file   Highest education level: Some college, no degree  Occupational History   Occupation: cleans houses  Tobacco Use   Smoking status: Never   Smokeless tobacco: Never  Vaping Use   Vaping status: Never Used  Substance and Sexual Activity   Alcohol use: Yes    Comment: twice montly   Drug use: No   Sexual activity: Not on file  Other Topics Concern   Not on file  Social History Narrative   Not on file   Social Determinants of Health   Financial Resource Strain: Low Risk  (03/23/2023)   Overall Financial Resource Strain (CARDIA)     Difficulty of Paying Living Expenses: Not hard at all  Food Insecurity: No Food Insecurity (03/23/2023)   Hunger Vital Sign    Worried About Running Out of Food in the Last Year: Never true    Ran Out of Food in the Last Year: Never true  Transportation Needs: No Transportation Needs (03/23/2023)   PRAPARE - Administrator, Civil Service (Medical): No    Lack of Transportation (Non-Medical): No  Physical Activity: Unknown (03/23/2023)   Exercise Vital Sign    Days of Exercise per Week: Patient declined    Minutes of Exercise per Session: Not on file  Stress: No Stress Concern Present (03/23/2023)   [employer] of Occupational Health - Occupational Stress Questionnaire    Feeling of Stress : Only a little  Social Connections: Socially Integrated (03/23/2023)   Social Connection and Isolation Panel [NHANES]    Frequency of Communication with Friends and Family: Three times a week    Frequency of Social Gatherings with Friends and Family: Three times a week    Attends Religious Services: More than 4 times per year    Active Member of Clubs or Organizations: Yes    Attends Engineer, structural: More than 4 times per year    Marital Status: Married  Catering manager Violence: Not on file   Family Status  Relation Name Status   Mother  Deceased   Father  Deceased   Sister  Alive   Sister  Alive   Sister  Alive   Sister  Alive   Brother  Alive   Oceanographer  (Not Specified)   MGM  (Not Specified)   Other  (Not Specified)  No partnership data on file   Family History  Problem Relation Age of Onset   Diabetes Mother    Diabetes Sister    Sickle cell anemia Paternal Aunt    Heart disease Maternal Grandmother        heart enlargement   Diabetes Other        1st degree relative   Allergies  Allergen Reactions   Lisinopril Hives, Itching and Swelling    Lip swelling, itching from head to toe, hives chest, breast, and abdomen.   Atorvastatin Other (See  Comments)    Myalgias      ROS    Objective:     BP 128/80 (BP Location: Left Arm, Patient Position: Sitting, Cuff Size: Large)   Pulse 67   Temp 97.7 F (36.5 C) (Oral)   Resp 18   Ht 5\' 8"  (1.727 m)   Wt 217 lb 9.6 oz (98.7 kg)   LMP  (LMP Unknown)   SpO2 96%   BMI 33.09 kg/m  BP Readings from Last 3 Encounters:  03/26/23 128/80  11/09/22 (!) 120/90  12/05/21 132/82   Wt Readings from Last 3 Encounters:  03/26/23 217 lb 9.6 oz (98.7 kg)  11/09/22 210 lb 3.2 oz (95.3 kg)  12/05/21 204 lb 12.8 oz (92.9 kg)   SpO2 Readings from Last 3 Encounters:  03/26/23 96%  11/09/22 96%  12/05/21 97%      Physical Exam Vitals and nursing note reviewed.  Constitutional:      General: She is not in acute distress.    Appearance: Normal appearance. She is well-developed.  HENT:     Head: Normocephalic and atraumatic.  Eyes:     General: No scleral icterus.       Right eye: No discharge.        Left eye: No discharge.  Cardiovascular:     Rate and Rhythm: Normal rate and regular rhythm.     Heart sounds: No murmur heard. Pulmonary:     Effort: Pulmonary effort is normal. No respiratory distress.     Breath sounds: Normal breath sounds.  Musculoskeletal:        General: Normal range of motion.     Cervical back: Normal range of motion and neck supple.     Right lower leg: No edema.     Left lower leg: No edema.  Skin:    General: Skin is warm and dry.  Neurological:     Mental Status: She is alert and oriented to person, place, and time.  Psychiatric:        Mood and Affect: Mood normal.        Behavior: Behavior normal.        Thought Content: Thought content normal.        Judgment: Judgment normal.      No results found for any visits on 03/26/23.  Last CBC Lab Results  Component Value Date   WBC 5.2 01/09/2023   HGB 13.2 01/09/2023   HCT 41.5 01/09/2023   MCV 75.7 (L) 01/09/2023   MCH 23.5 (L) 01/27/2021   RDW 15.0 01/09/2023   PLT 263.0  01/09/2023   Last metabolic panel Lab Results  Component Value Date   GLUCOSE 187 (H) 01/09/2023   NA 139 01/09/2023   K 3.9 01/09/2023   CL 100 01/09/2023   CO2 29 01/09/2023   BUN 11 01/09/2023   CREATININE 0.83 01/09/2023   GFR 77.22 01/09/2023   CALCIUM 9.1 01/09/2023   PROT 7.4 01/09/2023   ALBUMIN 4.2 01/09/2023   BILITOT 0.3 01/09/2023   ALKPHOS 70 01/09/2023   AST 35 01/09/2023   ALT 51 (H) 01/09/2023   ANIONGAP 9 09/17/2018   Last lipids Lab Results  Component Value Date   CHOL 196 01/09/2023   HDL 42.50 01/09/2023   LDLCALC 121 (H) 01/09/2023   LDLDIRECT 131.0 01/08/2020   TRIG 166.0 (H) 01/09/2023   CHOLHDL 5 01/09/2023   Last hemoglobin A1c Lab Results  Component Value Date   HGBA1C 7.8 (H) 01/09/2023   Last thyroid functions Lab Results  Component Value Date   TSH 1.66 10/21/2019   Last vitamin D No results found for: "25OHVITD2", "25OHVITD3", "VD25OH" Last vitamin B12 and Folate Lab Results  Component Value Date   VITAMINB12 453 04/12/2012      The ASCVD Risk score (Arnett DK, et al., 2019) failed to calculate for the following reasons:   The patient has a prior MI or stroke diagnosis    Assessment & Plan:   Problem List Items Addressed This Visit       Unprioritized   Uncontrolled type 2 diabetes mellitus with hyperglycemia (HCC) - Primary   Relevant Medications   empagliflozin (JARDIANCE) 10 MG TABS tablet  Other Relevant Orders   CBC with Differential/Platelet   Comprehensive metabolic panel   Lipid panel   Hemoglobin A1c   Assessment and Plan    Type 2 Diabetes Mellitus Patient on insulin with recent dose increase. Unclear glycemic control due to lack of recent A1c. Patient interested in adding Jardiance. -Start Jardiance at lower dose. -Check A1c in early September. -Patient to send in blood glucose readings every other day via MyChart.  Possible Connective Tissue Disease Patient concerned about previous lab results  suggestive of connective tissue disease. Rheumatology consult completed with additional testing, but results not available. -Obtain notes from Rheumatology consult with Dr. Dimple Casey for further assessment.  Hypertension Blood pressure controlled at 128. -Continue current management.  General Health Maintenance -Continue wearing compression stockings as needed for work. -Obtain notes from previous ophthalmology consult regarding past eye issue.       No follow-ups on file.    Donato Schultz, DO

## 2023-03-28 ENCOUNTER — Encounter: Payer: Self-pay | Admitting: Family Medicine

## 2023-04-03 ENCOUNTER — Other Ambulatory Visit: Payer: Self-pay | Admitting: Family Medicine

## 2023-04-03 DIAGNOSIS — E1165 Type 2 diabetes mellitus with hyperglycemia: Secondary | ICD-10-CM

## 2023-04-13 ENCOUNTER — Other Ambulatory Visit: Payer: BLUE CROSS/BLUE SHIELD

## 2023-04-20 ENCOUNTER — Other Ambulatory Visit: Payer: BLUE CROSS/BLUE SHIELD

## 2023-04-25 ENCOUNTER — Encounter: Payer: Self-pay | Admitting: Family Medicine

## 2023-04-27 NOTE — Telephone Encounter (Signed)
Forms placed in pcps box

## 2023-04-29 ENCOUNTER — Other Ambulatory Visit: Payer: Self-pay | Admitting: Family Medicine

## 2023-05-02 ENCOUNTER — Other Ambulatory Visit: Payer: Self-pay | Admitting: Family Medicine

## 2023-05-02 DIAGNOSIS — E1165 Type 2 diabetes mellitus with hyperglycemia: Secondary | ICD-10-CM

## 2023-05-11 ENCOUNTER — Other Ambulatory Visit: Payer: BLUE CROSS/BLUE SHIELD

## 2023-05-11 ENCOUNTER — Other Ambulatory Visit: Payer: Self-pay | Admitting: Family Medicine

## 2023-05-11 ENCOUNTER — Encounter: Payer: Self-pay | Admitting: Family Medicine

## 2023-05-11 DIAGNOSIS — E1169 Type 2 diabetes mellitus with other specified complication: Secondary | ICD-10-CM

## 2023-05-11 DIAGNOSIS — I10 Essential (primary) hypertension: Secondary | ICD-10-CM

## 2023-05-11 DIAGNOSIS — Z1212 Encounter for screening for malignant neoplasm of rectum: Secondary | ICD-10-CM

## 2023-05-11 DIAGNOSIS — E1165 Type 2 diabetes mellitus with hyperglycemia: Secondary | ICD-10-CM

## 2023-05-11 DIAGNOSIS — Z1211 Encounter for screening for malignant neoplasm of colon: Secondary | ICD-10-CM

## 2023-05-11 NOTE — Addendum Note (Signed)
Addended by: Maximino Sarin on: 05/11/2023 11:03 AM   Modules accepted: Orders

## 2023-05-11 NOTE — Telephone Encounter (Signed)
Please advise 

## 2023-05-14 ENCOUNTER — Telehealth: Payer: Self-pay

## 2023-05-14 ENCOUNTER — Other Ambulatory Visit: Payer: BLUE CROSS/BLUE SHIELD

## 2023-05-14 NOTE — Telephone Encounter (Signed)
[facility] called stating patient labels ended up in a lab bag specimen of another patient's and pt needed to be called back for a redraw and no safety zone portal would be reported or needed. Made lab aware two patient encounters were an hour apart and unaware how label mishap happened. Pt called Friday to let her know that her and another pt blood were mixed up and she stated on the phone " that's odd because I watched the girl label my blood" , I told her it was me who drew her blood and I am sorry about the mishap and asked her to come back to the office Friday or Monday and even offered an appointment at [facility] where our labs were sent and she voiced she'd like to come back Monday to our office , appt was made for Monday.

## 2023-05-21 ENCOUNTER — Encounter: Payer: Self-pay | Admitting: Family Medicine

## 2023-05-22 ENCOUNTER — Other Ambulatory Visit: Payer: BLUE CROSS/BLUE SHIELD

## 2023-05-31 ENCOUNTER — Other Ambulatory Visit: Payer: Self-pay | Admitting: Family Medicine

## 2023-06-03 ENCOUNTER — Encounter: Payer: Self-pay | Admitting: Family Medicine

## 2023-06-04 NOTE — Telephone Encounter (Signed)
Please advise 

## 2023-06-05 MED ORDER — METFORMIN HCL ER (MOD) 1000 MG PO TB24: 1000.0000 mg | ORAL_TABLET | Freq: Every day | ORAL | 2 refills | Status: DC

## 2023-06-07 MED ORDER — METFORMIN HCL 1000 MG PO TABS: 1000.0000 mg | ORAL_TABLET | Freq: Two times a day (BID) | ORAL | 2 refills | Status: DC

## 2023-06-21 ENCOUNTER — Encounter: Payer: Self-pay | Admitting: Family Medicine

## 2023-06-21 NOTE — Telephone Encounter (Signed)
Care team updated and letter sent for eye exam notes.

## 2023-07-22 ENCOUNTER — Other Ambulatory Visit: Payer: Self-pay | Admitting: Family Medicine

## 2023-07-22 DIAGNOSIS — E1165 Type 2 diabetes mellitus with hyperglycemia: Secondary | ICD-10-CM

## 2023-08-10 ENCOUNTER — Other Ambulatory Visit (INDEPENDENT_AMBULATORY_CARE_PROVIDER_SITE_OTHER): Payer: 59

## 2023-08-10 DIAGNOSIS — E1165 Type 2 diabetes mellitus with hyperglycemia: Secondary | ICD-10-CM | POA: Diagnosis not present

## 2023-08-10 DIAGNOSIS — I10 Essential (primary) hypertension: Secondary | ICD-10-CM

## 2023-08-10 DIAGNOSIS — E1169 Type 2 diabetes mellitus with other specified complication: Secondary | ICD-10-CM | POA: Diagnosis not present

## 2023-08-10 DIAGNOSIS — E785 Hyperlipidemia, unspecified: Secondary | ICD-10-CM | POA: Diagnosis not present

## 2023-08-11 LAB — CBC WITH DIFFERENTIAL/PLATELET
Absolute Lymphocytes: 2981 {cells}/uL (ref 850–3900)
Absolute Monocytes: 770 {cells}/uL (ref 200–950)
Basophils Absolute: 41 {cells}/uL (ref 0–200)
Basophils Relative: 0.5 %
Eosinophils Absolute: 178 {cells}/uL (ref 15–500)
Eosinophils Relative: 2.2 %
HCT: 42.3 % (ref 35.0–45.0)
Hemoglobin: 13.8 g/dL (ref 11.7–15.5)
MCH: 24 pg — ABNORMAL LOW (ref 27.0–33.0)
MCHC: 32.6 g/dL (ref 32.0–36.0)
MCV: 73.4 fL — ABNORMAL LOW (ref 80.0–100.0)
MPV: 10.7 fL (ref 7.5–12.5)
Monocytes Relative: 9.5 %
Neutro Abs: 4131 {cells}/uL (ref 1500–7800)
Neutrophils Relative %: 51 %
Platelets: 255 10*3/uL (ref 140–400)
RBC: 5.76 10*6/uL — ABNORMAL HIGH (ref 3.80–5.10)
RDW: 14.7 % (ref 11.0–15.0)
Total Lymphocyte: 36.8 %
WBC: 8.1 10*3/uL (ref 3.8–10.8)

## 2023-08-11 LAB — LIPID PANEL
Cholesterol: 87 mg/dL (ref <200)
HDL: 26 mg/dL — ABNORMAL LOW (ref >=50)
LDL Cholesterol (Calc): 41 mg/dL
Non-HDL Cholesterol (Calc): 61 mg/dL (ref <130)
Total CHOL/HDL Ratio: 3.3 (calc) (ref <5.0)
Triglycerides: 123 mg/dL (ref <150)

## 2023-08-11 LAB — COMPREHENSIVE METABOLIC PANEL
AG Ratio: 1.5 (calc) (ref 1.0–2.5)
ALT: 31 U/L — ABNORMAL HIGH (ref 6–29)
AST: 26 U/L (ref 10–35)
Albumin: 4.4 g/dL (ref 3.6–5.1)
Alkaline phosphatase (APISO): 85 U/L (ref 37–153)
BUN/Creatinine Ratio: 13 (calc) (ref 6–22)
BUN: 14 mg/dL (ref 7–25)
CO2: 26 mmol/L (ref 20–32)
Calcium: 10 mg/dL (ref 8.6–10.4)
Chloride: 104 mmol/L (ref 98–110)
Creat: 1.05 mg/dL — ABNORMAL HIGH (ref 0.50–1.03)
Globulin: 2.9 g/dL (ref 1.9–3.7)
Glucose, Bld: 108 mg/dL — ABNORMAL HIGH (ref 65–99)
Potassium: 3.9 mmol/L (ref 3.5–5.3)
Sodium: 143 mmol/L (ref 135–146)
Total Bilirubin: 0.3 mg/dL (ref 0.2–1.2)
Total Protein: 7.3 g/dL (ref 6.1–8.1)

## 2023-08-11 LAB — HEMOGLOBIN A1C
Hgb A1c MFr Bld: 11.8 %{Hb} — ABNORMAL HIGH (ref <5.7)
Mean Plasma Glucose: 292 mg/dL
eAG (mmol/L): 16.2 mmol/L

## 2023-08-13 ENCOUNTER — Encounter: Payer: Self-pay | Admitting: Family Medicine

## 2023-08-13 DIAGNOSIS — Z1231 Encounter for screening mammogram for malignant neoplasm of breast: Secondary | ICD-10-CM | POA: Diagnosis not present

## 2023-08-13 LAB — HM MAMMOGRAPHY

## 2023-08-13 MED ORDER — EMPAGLIFLOZIN 25 MG PO TABS: 25.0000 mg | ORAL_TABLET | Freq: Every day | ORAL | 0 refills | Status: DC

## 2023-08-14 ENCOUNTER — Encounter: Payer: Self-pay | Admitting: Family Medicine

## 2023-08-14 ENCOUNTER — Ambulatory Visit: Payer: BLUE CROSS/BLUE SHIELD | Admitting: Family Medicine

## 2023-08-15 ENCOUNTER — Other Ambulatory Visit: Payer: Self-pay

## 2023-08-15 DIAGNOSIS — E1165 Type 2 diabetes mellitus with hyperglycemia: Secondary | ICD-10-CM

## 2023-09-03 ENCOUNTER — Other Ambulatory Visit: Payer: Self-pay | Admitting: Family Medicine

## 2023-10-01 ENCOUNTER — Other Ambulatory Visit: Payer: Self-pay | Admitting: Family Medicine

## 2023-10-10 ENCOUNTER — Other Ambulatory Visit: Payer: Self-pay | Admitting: Physician Assistant

## 2023-10-10 DIAGNOSIS — I10 Essential (primary) hypertension: Secondary | ICD-10-CM

## 2023-10-19 ENCOUNTER — Telehealth: Payer: Self-pay | Admitting: *Deleted

## 2023-10-19 NOTE — Telephone Encounter (Signed)
 Patient was identified as falling into the [place] Measure - Diabetes.   Patient was: Appointment scheduled for lab or office visit for A1c.

## 2023-10-23 ENCOUNTER — Ambulatory Visit (INDEPENDENT_AMBULATORY_CARE_PROVIDER_SITE_OTHER): Payer: 59 | Admitting: Family Medicine

## 2023-10-23 ENCOUNTER — Encounter: Payer: Self-pay | Admitting: Family Medicine

## 2023-10-23 VITALS — BP 150/84 | HR 77 | Temp 98.7°F | Resp 18 | Ht 68.0 in | Wt 207.8 lb

## 2023-10-23 DIAGNOSIS — Z7984 Long term (current) use of oral hypoglycemic drugs: Secondary | ICD-10-CM

## 2023-10-23 DIAGNOSIS — E1169 Type 2 diabetes mellitus with other specified complication: Secondary | ICD-10-CM

## 2023-10-23 DIAGNOSIS — I10 Essential (primary) hypertension: Secondary | ICD-10-CM | POA: Diagnosis not present

## 2023-10-23 DIAGNOSIS — E785 Hyperlipidemia, unspecified: Secondary | ICD-10-CM

## 2023-10-23 DIAGNOSIS — E1165 Type 2 diabetes mellitus with hyperglycemia: Secondary | ICD-10-CM | POA: Diagnosis not present

## 2023-10-23 NOTE — Progress Notes (Signed)
 Established Patient Office Visit  Subjective   Patient ID: Danielle Flores, female    DOB: June 02, 1966  Age: 58 y.o. MRN: [id number]  Chief Complaint  Patient presents with   Diabetes   Hyperlipidemia   Hypertension   Follow-up    HPI Discussed the use of AI scribe software for clinical note transcription with the patient, who gave verbal consent to proceed.  History of Present Illness   Danielle Flores is a 58 year old female with hyperlipidemia and hypertension who presents for medication management and follow-up.  She has hyperlipidemia, with recent lab results showing a total cholesterol of 87 mg/dL, LDL of 41 mg/dL, and HDL of 26 mg/dL. She is currently taking Zetia, which was added after starting rosuvastatin, and also takes Omega XL for cholesterol management, although she is waiting for her order to arrive.  Her blood pressure was noted to be slightly elevated. She takes metoprolol three times a day and amlodipine once a day. She missed her afternoon dose of metoprolol due to her appointment.  She has a history of diabetes and is currently taking metformin, Rybelsus, and Jardiance. Her last A1c was noted to be high. She also uses Relyon glycerin. No chest pain. She has previously declined a stress test and has a history of having done a stress test many years ago with a previous doctor.  Her feet are in good condition, and she has not visited an eye doctor since 2022 after a blood vessel broke behind her eye. She has not had a flu shot, shingles vaccine, or pneumonia shot.  Her family history includes her youngest daughter passing away in 2011 from adenocarcinoma, which was later found to have a pea-sized tumor in the kidney.      Patient Active Problem List   Diagnosis Date Noted   Positive ANA (antinuclear antibody) 05/02/2021   Lupus anticoagulant positive 05/02/2021   Pain in left wrist 02/20/2021   Pain in right wrist 02/20/2021   Pain in joint, multiple sites  01/14/2021   Hyperlipidemia associated with type 2 diabetes mellitus (HCC) 01/14/2021   Uncontrolled type 2 diabetes mellitus with hyperglycemia (HCC) 01/14/2021   CAD in native artery/Stent 09/16/2018 09/16/2018   DKA, type 2 (HCC) 09/13/2018   NSTEMI (non-ST elevated myocardial infarction) (HCC) 09/13/2018   DKA (diabetic ketoacidosis) (HCC) 09/13/2018   Elevated troponin    DM (diabetes mellitus) type II uncontrolled, periph vascular disorder 08/06/2017   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   GENERALIZED ANXIETY DISORDER 10/13/2010   CARDIAC MURMUR 10/13/2010   History of cardiovascular disorder 02/28/2008   ELBOW PAIN, RIGHT 11/01/2007   Hyperlipidemia LDL goal <100 10/14/2007   HYPERGLYCEMIA 10/14/2007   SICKLE CELL TRAIT 01/24/2007   Essential hypertension 01/24/2007   POSTPROCEDURAL STATUS NEC 01/24/2007   Past Medical History:  Diagnosis Date   Hyperglycemia    Hyperlipidemia    Hypertension    Past Surgical History:  Procedure Laterality Date   CESAREAN SECTION     CORONARY STENT INTERVENTION N/A 09/16/2018   Procedure: CORONARY STENT INTERVENTION;  Surgeon: Corky Crafts, MD;  Location: [facility]ASIVE [facility];  Service: Cardiovascular;  Laterality: N/A;   LEFT HEART CATH AND CORONARY ANGIOGRAPHY N/A 09/16/2018   Procedure: LEFT HEART CATH AND CORONARY ANGIOGRAPHY;  Surgeon: Corky Crafts, MD;  Location: [facility] INVASIVE CV LAB;  Service: Cardiovascular;  Laterality: N/A;   Social History   Tobacco Use   Smoking status: Never   Smokeless  tobacco: Never  Vaping Use   Vaping status: Never Used  Substance Use Topics   Alcohol use: Yes    Comment: twice montly   Drug use: No   Social History   Socioeconomic History   Marital status: Married    Spouse name: Not on file   Number of children: Not on file   Years of education: Not on file   Highest education level: Some college, no degree  Occupational History   Occupation: cleans houses   Tobacco Use   Smoking status: Never   Smokeless tobacco: Never  Vaping Use   Vaping status: Never Used  Substance and Sexual Activity   Alcohol use: Yes    Comment: twice montly   Drug use: No   Sexual activity: Not on file  Other Topics Concern   Not on file  Social History Narrative   Not on file   Social Drivers of Health   Financial Resource Strain: Low Risk  (08/09/2023)   Overall Financial Resource Strain (CARDIA)    Difficulty of Paying Living Expenses: Not very hard  Food Insecurity: No Food Insecurity (08/09/2023)   Hunger Vital Sign    Worried About Running Out of Food in the Last Year: Never true    Ran Out of Food in the Last Year: Never true  Transportation Needs: No Transportation Needs (08/09/2023)   PRAPARE - Administrator, Civil Service (Medical): No    Lack of Transportation (Non-Medical): No  Physical Activity: Sufficiently Active (08/09/2023)   Exercise Vital Sign    Days of Exercise per Week: 5 days    Minutes of Exercise per Session: 130 min  Stress: No Stress Concern Present (08/09/2023)   [employer] of Occupational Health - Occupational Stress Questionnaire    Feeling of Stress : Not at all  Social Connections: Socially Integrated (08/09/2023)   Social Connection and Isolation Panel [NHANES]    Frequency of Communication with Friends and Family: Three times a week    Frequency of Social Gatherings with Friends and Family: Once a week    Attends Religious Services: More than 4 times per year    Active Member of [employer] or Organizations: Yes    Attends Engineer, structural: More than 4 times per year    Marital Status: Married  Catering manager Violence: Not on file   Family Status  Relation Name Status   Mother  Deceased   Father  Deceased   Sister  Alive   Sister  Alive   Sister  Alive   Sister  Alive   Brother  Alive   Oceanographer  (Not Specified)   MGM  (Not Specified)   Other  (Not Specified)  No partnership data on file    Family History  Problem Relation Age of Onset   Diabetes Mother    Diabetes Sister    Sickle cell anemia Paternal Aunt    Heart disease Maternal Grandmother        heart enlargement   Diabetes Other        1st degree relative   Allergies  Allergen Reactions   Lisinopril Hives, Itching and Swelling    Lip swelling, itching from head to toe, hives chest, breast, and abdomen.   Atorvastatin Other (See Comments)    Myalgias      Review of Systems  Constitutional:  Negative for chills, fever and malaise/fatigue.  HENT:  Negative for congestion and hearing loss.   Eyes:  Negative  for discharge.  Respiratory:  Negative for cough, sputum production and shortness of breath.   Cardiovascular:  Negative for chest pain, palpitations and leg swelling.  Gastrointestinal:  Negative for abdominal pain, blood in stool, constipation, diarrhea, heartburn, nausea and vomiting.  Genitourinary:  Negative for dysuria, frequency, hematuria and urgency.  Musculoskeletal:  Negative for back pain, falls and myalgias.  Skin:  Negative for rash.  Neurological:  Negative for dizziness, sensory change, loss of consciousness, weakness and headaches.  Endo/Heme/Allergies:  Negative for environmental allergies. Does not bruise/bleed easily.  Psychiatric/Behavioral:  Negative for depression and suicidal ideas. The patient is not nervous/anxious and does not have insomnia.       Objective:     BP (!) 150/84 (BP Location: Right Arm, Patient Position: Sitting, Cuff Size: Normal)   Pulse 77   Temp 98.7 F (37.1 C) (Oral)   Resp 18   Ht 5\' 8"  (1.727 m)   Wt 207 lb 12.8 oz (94.3 kg)   LMP  (LMP Unknown)   SpO2 99%   BMI 31.60 kg/m  BP Readings from Last 3 Encounters:  10/23/23 (!) 150/84  03/26/23 128/80  11/09/22 (!) 120/90   Wt Readings from Last 3 Encounters:  10/23/23 207 lb 12.8 oz (94.3 kg)  03/26/23 217 lb 9.6 oz (98.7 kg)  11/09/22 210 lb 3.2 oz (95.3 kg)   SpO2 Readings from Last 3  Encounters:  10/23/23 99%  03/26/23 96%  11/09/22 96%      Physical Exam Vitals and nursing note reviewed.  Constitutional:      General: She is not in acute distress.    Appearance: Normal appearance. She is well-developed.  HENT:     Head: Normocephalic and atraumatic.  Eyes:     General: No scleral icterus.       Right eye: No discharge.        Left eye: No discharge.  Cardiovascular:     Rate and Rhythm: Normal rate and regular rhythm.     Heart sounds: No murmur heard. Pulmonary:     Effort: Pulmonary effort is normal. No respiratory distress.     Breath sounds: Normal breath sounds.  Musculoskeletal:        General: Normal range of motion.     Cervical back: Normal range of motion and neck supple.     Right lower leg: No edema.     Left lower leg: No edema.  Skin:    General: Skin is warm and dry.  Neurological:     Mental Status: She is alert and oriented to person, place, and time.  Psychiatric:        Mood and Affect: Mood normal.        Behavior: Behavior normal.        Thought Content: Thought content normal.        Judgment: Judgment normal.      Results for orders placed or performed in visit on 10/23/23  Comprehensive metabolic panel  Result Value Ref Range   Sodium 141 135 - 145 mEq/L   Potassium 4.1 3.5 - 5.1 mEq/L   Chloride 103 96 - 112 mEq/L   CO2 26 19 - 32 mEq/L   Glucose, Bld 89 70 - 99 mg/dL   BUN 12 6 - 23 mg/dL   Creatinine, Ser 4.33 0.40 - 1.20 mg/dL   Total Bilirubin 0.3 0.2 - 1.2 mg/dL   Alkaline Phosphatase 72 39 - 117 U/L   AST 28 0 - 37 U/L  ALT 30 0 - 35 U/L   Total Protein 8.0 6.0 - 8.3 g/dL   Albumin 4.8 3.5 - 5.2 g/dL   GFR 16.10 (L) >96.04 mL/min   Calcium 10.6 (H) 8.4 - 10.5 mg/dL  Lipid panel  Result Value Ref Range   Cholesterol 95 0 - 200 mg/dL   Triglycerides 540.9 0.0 - 149.0 mg/dL   HDL 81.19 (L) >14.78 mg/dL   VLDL 29.5 0.0 - 62.1 mg/dL   LDL Cholesterol 39 0 - 99 mg/dL   Total CHOL/HDL Ratio 3    NonHDL  61.08   Hemoglobin A1c  Result Value Ref Range   Hgb A1c MFr Bld 7.6 (H) 4.6 - 6.5 %  Microalbumin / creatinine urine ratio  Result Value Ref Range   Microalb, Ur 4.4 (H) 0.0 - 1.9 mg/dL   Creatinine,U 30.8 mg/dL   Microalb Creat Ratio 73.5 (H) 0.0 - 30.0 mg/g    Last CBC Lab Results  Component Value Date   WBC 8.1 08/10/2023   HGB 13.8 08/10/2023   HCT 42.3 08/10/2023   MCV 73.4 (L) 08/10/2023   MCH 24.0 (L) 08/10/2023   RDW 14.7 08/10/2023   PLT 255 08/10/2023   Last metabolic panel Lab Results  Component Value Date   GLUCOSE 89 10/23/2023   NA 141 10/23/2023   K 4.1 10/23/2023   CL 103 10/23/2023   CO2 26 10/23/2023   BUN 12 10/23/2023   CREATININE 1.04 10/23/2023   GFR 58.58 (L) 10/23/2023   CALCIUM 10.6 (H) 10/23/2023   PROT 8.0 10/23/2023   ALBUMIN 4.8 10/23/2023   BILITOT 0.3 10/23/2023   ALKPHOS 72 10/23/2023   AST 28 10/23/2023   ALT 30 10/23/2023   ANIONGAP 9 09/17/2018   Last lipids Lab Results  Component Value Date   CHOL 95 10/23/2023   HDL 33.70 (L) 10/23/2023   LDLCALC 39 10/23/2023   LDLDIRECT 131.0 01/08/2020   TRIG 109.0 10/23/2023   CHOLHDL 3 10/23/2023   Last hemoglobin A1c Lab Results  Component Value Date   HGBA1C 7.6 (H) 10/23/2023   Last thyroid functions Lab Results  Component Value Date   TSH 1.66 10/21/2019   Last vitamin D No results found for: "25OHVITD2", "25OHVITD3", "VD25OH" Last vitamin B12 and Folate Lab Results  Component Value Date   VITAMINB12 453 04/12/2012      The ASCVD Risk score (Arnett DK, et al., 2019) failed to calculate for the following reasons:   Risk score cannot be calculated because patient has a medical history suggesting prior/existing ASCVD    Assessment & Plan:   Problem List Items Addressed This Visit       Unprioritized   Hyperlipidemia LDL goal <100   Relevant Orders   Microalbumin / creatinine urine ratio (Completed)   Uncontrolled type 2 diabetes mellitus with hyperglycemia  (HCC) - Primary   Hgba1c to be checked  minimize simple carbs. Increase exercise as tolerated. Continue current meds       Relevant Orders   Lipid panel (Completed)   Hemoglobin A1c (Completed)   Microalbumin / creatinine urine ratio (Completed)   Hyperlipidemia associated with type 2 diabetes mellitus (HCC)   Encourage heart healthy diet such as MIND or DASH diet, increase exercise, avoid trans fats, simple carbohydrates and processed foods, consider a krill or fish or flaxseed oil cap daily.        Essential hypertension   Well controlled, no changes to meds. Encouraged heart healthy diet such as the DASH diet  and exercise as tolerated.        Other Visit Diagnoses       Primary hypertension       Relevant Orders   Comprehensive metabolic panel (Completed)   Lipid panel (Completed)   Microalbumin / creatinine urine ratio (Completed)     Assessment and Plan    Type 2 Diabetes Mellitus   She is on metformin, Rybelsus, and Jardiance with suboptimal control as indicated by a high A1c. Order an HbA1c test and review current lab results to assess diabetes control.  Hypertension   Her blood pressure is slightly elevated. She is currently taking metoprolol three times a day and amlodipine once a day. A missed dose of metoprolol due to the appointment may have contributed to the elevated reading. Continue the current antihypertensive regimen.  Hyperlipidemia   LDL cholesterol is well-controlled at 41 mg/dL, total cholesterol is 87 mg/dL, and HDL is low at 26 mg/dL. She is on rosuvastatin and Zetia. Consider lowering the medication dose or discontinuing Zetia, but first review previous lab results to understand the rationale for adding Zetia, which was added after starting rosuvastatin when LDL was in the 100s.  General Health Maintenance   She has not received a flu shot, shingles vaccine, or pneumonia vaccine and has not had an eye exam since 2022, which is recommended annually for  diabetic patients. Recommend an annual eye exam for diabetic retinopathy screening. Discuss and recommend the shingles, pneumonia, and flu vaccines, emphasizing their importance given her age and diabetic status.        No follow-ups on file.    Donato Schultz, DO

## 2023-10-24 ENCOUNTER — Encounter: Payer: Self-pay | Admitting: Family Medicine

## 2023-10-24 LAB — LIPID PANEL
Cholesterol: 95 mg/dL (ref 0–200)
HDL: 33.7 mg/dL — ABNORMAL LOW (ref >39.00)
LDL Cholesterol: 39 mg/dL (ref 0–99)
NonHDL: 61.08
Total CHOL/HDL Ratio: 3
Triglycerides: 109 mg/dL (ref 0.0–149.0)
VLDL: 21.8 mg/dL (ref 0.0–40.0)

## 2023-10-24 LAB — COMPREHENSIVE METABOLIC PANEL
ALT: 30 U/L (ref 0–35)
AST: 28 U/L (ref 0–37)
Albumin: 4.8 g/dL (ref 3.5–5.2)
Alkaline Phosphatase: 72 U/L (ref 39–117)
BUN: 12 mg/dL (ref 6–23)
CO2: 26 meq/L (ref 19–32)
Calcium: 10.6 mg/dL — ABNORMAL HIGH (ref 8.4–10.5)
Chloride: 103 meq/L (ref 96–112)
Creatinine, Ser: 1.04 mg/dL (ref 0.40–1.20)
GFR: 58.58 mL/min — ABNORMAL LOW (ref >60.00)
Glucose, Bld: 89 mg/dL (ref 70–99)
Potassium: 4.1 meq/L (ref 3.5–5.1)
Sodium: 141 meq/L (ref 135–145)
Total Bilirubin: 0.3 mg/dL (ref 0.2–1.2)
Total Protein: 8 g/dL (ref 6.0–8.3)

## 2023-10-24 LAB — MICROALBUMIN / CREATININE URINE RATIO
Creatinine,U: 60.2 mg/dL
Microalb Creat Ratio: 73.5 mg/g — ABNORMAL HIGH (ref 0.0–30.0)
Microalb, Ur: 4.4 mg/dL — ABNORMAL HIGH (ref 0.0–1.9)

## 2023-10-24 LAB — HEMOGLOBIN A1C: Hgb A1c MFr Bld: 7.6 % — ABNORMAL HIGH (ref 4.6–6.5)

## 2023-10-26 ENCOUNTER — Encounter: Payer: Self-pay | Admitting: Family Medicine

## 2023-10-29 ENCOUNTER — Other Ambulatory Visit: Payer: Self-pay | Admitting: Physician Assistant

## 2023-10-29 NOTE — Telephone Encounter (Signed)
 Please advise for the micro

## 2023-11-03 ENCOUNTER — Other Ambulatory Visit: Payer: Self-pay | Admitting: Family Medicine

## 2023-11-07 ENCOUNTER — Other Ambulatory Visit: Payer: Self-pay | Admitting: Family Medicine

## 2023-11-07 ENCOUNTER — Encounter: Payer: Self-pay | Admitting: Family Medicine

## 2023-11-07 NOTE — Assessment & Plan Note (Signed)
 Well controlled, no changes to meds. Encouraged heart healthy diet such as the DASH diet and exercise as tolerated.

## 2023-11-07 NOTE — Assessment & Plan Note (Signed)
 Encourage heart healthy diet such as MIND or DASH diet, increase exercise, avoid trans fats, simple carbohydrates and processed foods, consider a krill or fish or flaxseed oil cap daily.

## 2023-11-07 NOTE — Patient Instructions (Signed)

## 2023-11-07 NOTE — Assessment & Plan Note (Signed)
 Hgba1c to be checked  minimize simple carbs. Increase exercise as tolerated. Continue current meds

## 2023-11-20 ENCOUNTER — Other Ambulatory Visit: Payer: Self-pay | Admitting: Family Medicine

## 2023-12-02 ENCOUNTER — Other Ambulatory Visit: Payer: Self-pay | Admitting: Interventional Cardiology

## 2023-12-02 DIAGNOSIS — I2510 Atherosclerotic heart disease of native coronary artery without angina pectoris: Secondary | ICD-10-CM

## 2023-12-03 ENCOUNTER — Ambulatory Visit: Payer: 59 | Admitting: Physician Assistant

## 2023-12-05 ENCOUNTER — Encounter: Payer: Self-pay | Admitting: Emergency Medicine

## 2023-12-05 ENCOUNTER — Ambulatory Visit: Attending: Emergency Medicine | Admitting: Emergency Medicine

## 2023-12-05 VITALS — BP 146/84 | HR 61 | Ht 65.0 in | Wt 205.0 lb

## 2023-12-05 DIAGNOSIS — I2510 Atherosclerotic heart disease of native coronary artery without angina pectoris: Secondary | ICD-10-CM | POA: Diagnosis not present

## 2023-12-05 DIAGNOSIS — I10 Essential (primary) hypertension: Secondary | ICD-10-CM | POA: Diagnosis not present

## 2023-12-05 DIAGNOSIS — E785 Hyperlipidemia, unspecified: Secondary | ICD-10-CM

## 2023-12-05 DIAGNOSIS — E1110 Type 2 diabetes mellitus with ketoacidosis without coma: Secondary | ICD-10-CM

## 2023-12-05 MED ORDER — CARVEDILOL 25 MG PO TABS: 25.0000 mg | ORAL_TABLET | Freq: Two times a day (BID) | ORAL | 8 refills | Status: DC

## 2023-12-05 NOTE — Patient Instructions (Signed)
 Medication Instructions:  STOP TAKING ASPIRIN  81 MG. STOP TAKING METOPROLOL  TARTRATE 100 MG. START TAKING CARVEDILOL 25 MG TWICE DAILY.   Lab Work: NONE   Testing/Procedures: NONE  Follow-Up: At [facility], you and your health needs are our priority.  As part of our continuing mission to provide you with exceptional heart care, our providers are all part of one team.  This team includes your primary Cardiologist (physician) and Advanced Practice Providers or APPs (Physician Assistants and Nurse Practitioners) who all work together to provide you with the care you need, when you need it.  Your next appointment:   6-8 WEEKS  Provider:   MADISON FOUNTAIN, DNP

## 2023-12-05 NOTE — Progress Notes (Signed)
 Cardiology Office Note:    Date:  12/05/2023  ID:  Danielle Flores, DOB 25-Aug-1965, MRN [id number] PCP: Crecencio Dodge, Candida Chalk, DO  [facility] Providers Cardiologist:  Avery Bodo, MD       Patient Profile:      Chief Complaint: 1 year follow-up for coronary artery disease History of Present Illness:  Danielle Flores is a 58 y.o. female with visit-pertinent history of hypertension, hyperlipidemia, coronary artery disease, type 2 diabetes, myocardial infarction  Cardiac catheterization February 2020 for NSTEMI showed proximal to mid RCA with 25% stenosis, proximal circumflex and mid circumflex 25% stenosis, ostial LAD lesion 25% stenosis, R PDA lesion 95% stenosis, a drug eluding stent was successfully placed to RPDA lesion, s/p DES to RPDA in 09/2018 postintervention there is 0% stenosis.  LVEF 65%.  Recommendation to continue dual antiplatelet therapy.  Echocardiogram February 2020 showed LVEF 60 to 65%, grade 2 diastolic dysfunction, severe septal hypertrophy with otherwise moderate concentric LVH, elevated left ventricular end-diastolic pressure, RV function and size normal, no valvular abnormalities.  She was last seen in clinic on 11/09/2022.  Patient was doing well without acute cardiovascular concerns or complaints.  Her Brilinta  was changed to Plavix  given cost of Brilinta .   Discussed the use of AI scribe software for clinical note transcription with the patient, who gave verbal consent to proceed.  History of Present Illness Danielle Flores is a 58 year old female with coronary artery disease, hypertension, and diabetes who presents for a yearly cardiovascular follow-up.  Patient notes she is doing well today and has had no cardiovascular concerns or complaints over the past year.  She experiences no chest pain, chest pressure, or shortness of breath. She takes Plavix  75 mg daily but has not taken aspirin  recently. Her LDL cholesterol is 39, total cholesterol is 95, and  triglycerides are 109. She is on rosuvastatin  and ezetimibe .  Her blood pressure is generally 130s to 140s at home but was slightly elevated today. She monitors it three times daily, with typical readings around 130-135/60-70. She is on metoprolol  100 mg three times a day and amlodipine .  She notes that she would like more options as she does not enjoy taking them metoprolol  3 times daily.  She manages diabetes with insulin , Jardiance , and metformin . She is working on weight loss and diet, and her A1c has improved.  She denies chest pain, shortness of breath, lower extremity edema, fatigue, palpitations, melena, hematuria, hemoptysis, diaphoresis, weakness, presyncope, syncope, orthopnea, and PND.  Review of systems:  Please see the history of present illness. All other systems are reviewed and otherwise negative.     Home Medications:    Current Meds  Medication Sig   amLODipine  (NORVASC ) 10 MG tablet TAKE 1 TABLET BY MOUTH EVERY DAY   blood glucose meter kit and supplies Relion Prime or Dispense other brand based on patient and insurance preference. Use up to four times daily as directed. (FOR ICD-9 250.00, 250.01).   carvedilol (COREG) 25 MG tablet Take 1 tablet (25 mg total) by mouth 2 (two) times daily.   clopidogrel  (PLAVIX ) 75 MG tablet Take 1 tablet by mouth once daily   empagliflozin  (JARDIANCE ) 25 MG TABS tablet TAKE 1 TABLET BY MOUTH ONCE DAILY BEFORE BREAKFAST   insulin  isophane & regular human KwikPen (NOVOLIN  70/30 KWIKPEN) (70-30) 100 UNIT/ML KwikPen INJECT 26 UNITS SUBCUTANEOUSLY TWICE DAILY   metFORMIN  (GLUCOPHAGE ) 1000 MG tablet Take 0.5 tablets (500 mg total) by mouth 2 (two) times daily  with a meal.   rosuvastatin  (CRESTOR ) 40 MG tablet Take 1 tablet (40 mg total) by mouth daily.   [DISCONTINUED] aspirin  EC 81 MG tablet Take 1 tablet by mouth daily.   [DISCONTINUED] ezetimibe  (ZETIA ) 10 MG tablet TAKE 1 TABLET BY MOUTH EVERY DAY   [DISCONTINUED] metoprolol  tartrate  (LOPRESSOR ) 100 MG tablet TAKE 1 TABLET BY MOUTH IN THE MORNING AND 1 AT NOON AND 1 AT BEDTIME   Studies Reviewed:   EKG Interpretation Date/Time:  Wednesday December 05 2023 08:29:51 EDT Ventricular Rate:  61 PR Interval:  190 QRS Duration:  94 QT Interval:  426 QTC Calculation: 428 R Axis:   23  Text Interpretation: Normal sinus rhythm Normal ECG When compared with ECG of 17-Sep-2018 07:57, No significant change was found Confirmed by Palmer Bobo [phone]) on 12/05/2023 9:16:40 AM    Echocardiogram 09/13/2018 1. The left ventricle has normal systolic function of 60-65%. The cavity  size was normal. There is severe septal hypertrophy with otherwise  moderate concentric LVH. Echo evidence of pseudonormalization in diastolic  relaxation Elevated left ventricular  end-diastolic pressure.   2. 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  There is a mid cavitary gradient with a peak velocity 2.18 m/s and peak  gradient 19 mmHg.   3. The right ventricle has normal systolic function. The cavity was  normal. There is no increase in right ventricular wall thickness.   4. The mitral valve is normal in structure.   5. The tricuspid valve is normal in structure.   6. The aortic valve is normal in structure.   7. The pulmonic valve was normal in structure.   8. Grade 2 diastolic dysfunction.   Cardiac catheterization 09/16/2018 Prox RCA to Mid RCA lesion is 25% stenosed. Prox Cx to Mid Cx lesion is 25% stenosed. Ost LAD lesion is 25% stenosed. RPDA lesion is 95% stenosed. A drug-eluting stent was successfully placed using a STENT SYNERGY DES 2.5X16. Post intervention, there is a 0% residual stenosis. The left ventricular ejection fraction is greater than 65% by visual estimate. There is hyperdynamic left ventricular systolic function. LV end diastolic pressure is normal. LVEDP 7 mm Hg. There is no aortic valve stenosis. Diagnostic Dominance:  Right  Intervention   Risk Assessment/Calculations:     HYPERTENSION CONTROL Vitals:   12/05/23 0833 12/05/23 0918  BP: (!) 142/80 (!) 146/84    The patient's blood pressure is elevated above target today.  In order to address the patient's elevated BP: A current anti-hypertensive medication was adjusted today.          Physical Exam:   VS:  BP (!) 146/84 (BP Location: Left Arm, Patient Position: Sitting, Cuff Size: Normal)   Pulse 61   Ht 5\' 5"  (1.651 m)   Wt 205 lb (93 kg)   LMP  (LMP Unknown)   SpO2 95%   BMI 34.11 kg/m    Wt Readings from Last 3 Encounters:  12/05/23 205 lb (93 kg)  10/23/23 207 lb 12.8 oz (94.3 kg)  03/26/23 217 lb 9.6 oz (98.7 kg)    GEN: Well nourished, well developed in no acute distress NECK: No JVD; No carotid bruits CARDIAC: RRR, no murmurs, rubs, gallops RESPIRATORY:  Clear to auscultation without rales, wheezing or rhonchi  ABDOMEN: Soft, non-tender, non-distended EXTREMITIES:  No edema; No acute deformity     Assessment and Plan:  Coronary artery disease S/p DES to RPDA in 09/2018 - Today and over the past year patient has  been without any anginal symptoms, no indication for further ischemic evaluation at this time - Notes that she has been off of aspirin  for some time now however has been adherent to daily Plavix .  Given her intervention was > 5 years ago, okay with continuing Plavix  monotherapy - Continue Plavix  75 mg daily, Zetia  10 mg daily, rosuvastatin  40 mg daily  Hyperlipidemia LDL 39, HDL 33, TG 109, TC 95 on 10/2023 LDL currently under excellent control and under goal of less than 70 - Continue rosuvastatin  40 mg daily and Zetia  10 mg daily  Hypertension Blood pressure today is 142/80 and repeat 146/84 Her blood pressure is above her goal of less than 130/80 Patient is currently on metoprolol  tartrate 100 mg 3 times daily (AM, afternoon, PM) and is not fond of her TID dosing and prefers not to add additional medications to  her current regimen - Plan will be to discontinue her metoprolol  tartrate 100 mg 3 times daily and start her on carvedilol 25 mg twice daily for better blood pressure control - Continue amlodipine  10 mg daily - Begin monitoring BP at home  Type 2 diabetes A1c 7.6 on 10/23/2023 - Managed by PCP on Jardiance , metformin , insulin      Dispo:  Return in about 8 weeks (around 01/30/2024).  Signed, Ava Boatman, NP

## 2023-12-19 ENCOUNTER — Telehealth: Payer: Self-pay

## 2023-12-19 MED ORDER — METOPROLOL TARTRATE 100 MG PO TABS
100.0000 mg | ORAL_TABLET | Freq: Three times a day (TID) | ORAL | 3 refills | Status: DC
Start: 2023-12-19 — End: 2024-06-04

## 2023-12-19 NOTE — Telephone Encounter (Signed)
Spoke with pt, New script sent to the pharmacy  

## 2023-12-19 NOTE — Telephone Encounter (Signed)
 Please advise on this. Pt's metoprolol  was stopped and Palmer Bobo NP is ok to refill. This RX just needs to be put back on RX list and refilled.

## 2023-12-25 ENCOUNTER — Other Ambulatory Visit: Payer: Self-pay | Admitting: Family Medicine

## 2023-12-25 DIAGNOSIS — E1165 Type 2 diabetes mellitus with hyperglycemia: Secondary | ICD-10-CM

## 2024-01-07 ENCOUNTER — Encounter: Payer: Self-pay | Admitting: Pharmacist

## 2024-01-07 ENCOUNTER — Other Ambulatory Visit: Payer: Self-pay | Admitting: Pharmacist

## 2024-01-07 MED ORDER — AMLODIPINE BESYLATE 10 MG PO TABS: 10.0000 mg | ORAL_TABLET | Freq: Every day | ORAL | 0 refills | Status: DC

## 2024-01-07 NOTE — Progress Notes (Signed)
 01/07/2024 Name: Danielle Flores MRN: [id number] DOB: 09/15/1965  Chief Complaint  Patient presents with   Diabetes    TNM    Patient was identified thru TNM report as having uncontrolled type 2 DM.   Subjective:  Care Team: Primary Care Provider: Crecencio Dodge, Candida Chalk, DO ; Next Scheduled Visit: not currently scheduled - last visit was 10/23/2023 Cardiology: Next Scheduled Visit - 01/22/2024  Medication Access/Adherence  Current Pharmacy:  [facility] [address], [place] - [address] [place] [address] Phone: [phone] Fax: [phone]   Patient reports affordability concerns with their medications: No  Patient reports access/transportation concerns to their pharmacy: No  Patient reports adherence concerns with their medications:  No    Patient did try carvedilol  25mg  twice a day in place of metoprolol  100mg  3 times a day but experienced dizziness with carvedilol  so she restarted metoprolol  with approval of cardiology office.  She has been checking blood pressure at home and blood pressure yesterday was 143/83. She is also taking amlodipine  10mg  daily but last refill was on 11/01/2023 - for 30 days - per patient she has been taking daily.    Diabetes:  Current medications: Jardiance  25mg  once a day, metformin  500mg  (1/2 tab of 1000mg ) twice a day and Novolin  70/30 - 26 units twice a day.   Medications tried in the past: Rybelsus  and Ozempic  - too expensive / patient preference; Trulicity  - too expensive  Patient has a high deductible insurance plan - her 2025 deductible is [phone]  Current glucose readings: didn't have exact reading available but patient states they are good - usually in the 100's Using fingerstick glucometer and is testing 1 to 2 times daily.   Prescribed Continuous Glucose Monitor in past but cost was a barrier and at the time her insurance required that she was using meal time / short acting insulin .  Mentioned that we could recheck Continuous Glucose Monitor coverage since it has been 3 years since it was denied but patient states today that she doesn't want to use Continuous Glucose Monitor.    Objective:  Lab Results  Component Value Date   HGBA1C 7.6 (H) 10/23/2023    Lab Results  Component Value Date   CREATININE 1.04 10/23/2023   BUN 12 10/23/2023   NA 141 10/23/2023   K 4.1 10/23/2023   CL 103 10/23/2023   CO2 26 10/23/2023    Lab Results  Component Value Date   CHOL 95 10/23/2023   HDL 33.70 (L) 10/23/2023   LDLCALC 39 10/23/2023   LDLDIRECT 131.0 01/08/2020   TRIG 109.0 10/23/2023   CHOLHDL 3 10/23/2023    Medications Reviewed Today     Reviewed by Cecilie Coffee, RPH-CPP (Pharmacist) on 01/07/24 at 1019  Med List Status: <None>   Medication Order Taking? Sig Documenting Provider Last Dose Status Informant  amLODipine  (NORVASC ) 10 MG tablet [id number] Yes TAKE 1 TABLET BY MOUTH EVERY DAY Lucendia Rusk, MD Taking Active   blood glucose meter kit and supplies [id number] Yes Relion Prime or Dispense other brand based on patient and insurance preference. Use up to four times daily as directed. (FOR ICD-9 250.00, 250.01). Colin Dawley, MD Taking Active   clopidogrel  (PLAVIX ) 75 MG tablet [id number] Yes Take 1 tablet by mouth once daily Conte, Tessa N, PA-C Taking Active   empagliflozin  (JARDIANCE ) 25 MG TABS tablet [id number] Yes TAKE 1 TABLET BY MOUTH ONCE DAILY BEFORE BREAKFAST Crecencio Dodge, Yvonne R,  DO Taking Active   ezetimibe  (ZETIA ) 10 MG tablet [id number] Yes Take 1 tablet by mouth once daily Palmer Bobo L, NP Taking Active   metFORMIN  (GLUCOPHAGE ) 1000 MG tablet [id number] Yes Take 0.5 tablets (500 mg total) by mouth 2 (two) times daily with a meal. Estill Hemming, DO Taking Active            Med Note Alida Ion, Liddie Chichester B   Mon Jan 07, 2024 10:19 AM)    metoprolol  tartrate (LOPRESSOR ) 100 MG tablet [id number] Yes Take 1 tablet (100 mg total) by  mouth 3 (three) times daily. Ava Boatman, NP Taking Active   NOVOLIN  70/30 KWIKPEN (70-30) 100 UNIT/ML KwikPen [id number] Yes INJECT 26 UNITS SUBCUTANEOUSLY TWICE DAILY Lowne Chase, Yvonne R, DO Taking Active   rosuvastatin  (CRESTOR ) 40 MG tablet [id number] Yes Take 1 tablet (40 mg total) by mouth daily. Roel Clarity R, DO Taking Active               Assessment/Plan:   Diabetes: Last A1c was not at goal but improved.  - Reviewed goal A1c, goal fasting, and goal 2 hour post prandial glucose - Recommend to continue current medications for diabetes.  - Reviewed her 2025 benefits - she has a [phone] deductible to meet. I call her insurance but they are not able to provide me the amount she has left until she meets deductible.  It did look like liraglutide (generic Victoza is listed as a preferred generic but when I checked the cost with her insurance the cost would be $697 until she meets deductible) . Other GLP type medications are preferred brands but also are subject to deductible - Trulicity , Rybelsus , Soliqua.  - Recommend to check glucose twice daily   Sent in updated Rx for amlodpine.   Cecilie Coffee, PharmD Clinical Pharmacist [facility]  Population Health [phone]

## 2024-01-22 ENCOUNTER — Ambulatory Visit: Attending: Emergency Medicine | Admitting: Emergency Medicine

## 2024-01-22 ENCOUNTER — Encounter: Payer: Self-pay | Admitting: Emergency Medicine

## 2024-01-22 VITALS — BP 126/74 | HR 72 | Ht 65.0 in | Wt 207.0 lb

## 2024-01-22 DIAGNOSIS — E1165 Type 2 diabetes mellitus with hyperglycemia: Secondary | ICD-10-CM

## 2024-01-22 DIAGNOSIS — E785 Hyperlipidemia, unspecified: Secondary | ICD-10-CM

## 2024-01-22 DIAGNOSIS — I10 Essential (primary) hypertension: Secondary | ICD-10-CM | POA: Diagnosis not present

## 2024-01-22 DIAGNOSIS — I2510 Atherosclerotic heart disease of native coronary artery without angina pectoris: Secondary | ICD-10-CM | POA: Diagnosis not present

## 2024-01-22 NOTE — Patient Instructions (Addendum)
 Medication Instructions:  NO CHANGES   Lab Work: NONE  Testing/Procedures: NONE  Follow-Up: At [facility], you and your health needs are our priority.  As part of our continuing mission to provide you with exceptional heart care, our providers are all part of one team.  This team includes your primary Cardiologist (physician) and Advanced Practice Providers or APPs (Physician Assistants and Nurse Practitioners) who all work together to provide you with the care you need, when you need it.  Your next appointment:   1 YEAR  Provider:   Palmer Bobo, DNP

## 2024-01-22 NOTE — Progress Notes (Signed)
 Cardiology Office Note:    Date:  01/23/2024  ID:  Carline Cheng, DOB [birth year], MRN [id number] PCP: Crecencio Dodge, Candida Chalk, DO  [facility] Providers Cardiologist:  Avery Bodo, MD       Patient Profile:       Chief Complaint: Follow-up for hypertension History of Present Illness:  Danielle Flores is a 58 y.o. female with visit-pertinent history of hypertension, hyperlipidemia, coronary artery disease, type 2 diabetes, myocardial infarction   Cardiac catheterization February 2020 for NSTEMI showed proximal to mid RCA with 25% stenosis, proximal circumflex and mid circumflex 25% stenosis, ostial LAD lesion 25% stenosis, R PDA lesion 95% stenosis, a drug eluding stent was successfully placed to RPDA lesion, s/p DES to RPDA in 09/2018 postintervention there is 0% stenosis.  LVEF 65%.  Recommendation to continue dual antiplatelet therapy.  Echocardiogram February 2020 showed LVEF 60 to 65%, grade 2 diastolic dysfunction, severe septal hypertrophy with otherwise moderate concentric LVH, elevated left ventricular end-diastolic pressure, RV function and size normal, no valvular abnormalities.   She was seen in clinic on 11/09/2022.  Patient was doing well without acute cardiovascular concerns or complaints.  Her Brilinta  was changed to Plavix  given cost of Brilinta .  She was last seen in office on 12/05/2023.  She was without any cardiovascular concerns or complaints.  Her blood pressure however was not elevated at 142/80 and repeat 146/84.  Her metoprolol  tartrate 100 mg 3 times daily was switched to carvedilol  25 mg twice daily.  Patient noted she began to experience dizziness and she was subsequently switched back to metoprolol  tartrate 100 mg 3 times daily.   Discussed the use of AI scribe software for clinical note transcription with the patient, who gave verbal consent to proceed.  History of Present Illness Danielle Flores is a 58 year old female with hypertension and coronary  artery disease who presents for follow-up of blood pressure management.  Today she is doing well overall.  She is without any acute cardiovascular concerns or complaints at this time.  She denies any anginal symptoms.  Currently, she is on metoprolol  three times daily without issues. Home blood pressure readings are typically in the 120s to 130s systolic, occasionally reaching 143/82 mmHg. She takes amlodipine  10 mg as well.  Cholesterol levels are well-controlled, with an LDL of 39 mg/dL.  She notes since switching back to metoprolol  3 times daily her blood pressure seems to be better controlled.  She no longer experiencing any dizziness.  She experiences no chest pain, shortness of breath, or leg swelling. She regularly wears compression socks.  Review of systems:  Please see the history of present illness. All other systems are reviewed and otherwise negative.      Studies Reviewed:        Echocardiogram 09/13/2018 1. The left ventricle has normal systolic function of 60-65%. The cavity  size was normal. There is severe septal hypertrophy with otherwise  moderate concentric LVH. Echo evidence of pseudonormalization in diastolic  relaxation Elevated left ventricular  end-diastolic pressure.   2. 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  There is a mid cavitary gradient with a peak velocity 2.18 m/s and peak  gradient 19 mmHg.   3. The right ventricle has normal systolic function. The cavity was  normal. There is no increase in right ventricular wall thickness.   4. The mitral valve is normal in structure.   5. The tricuspid valve is normal in structure.   6. The  aortic valve is normal in structure.   7. The pulmonic valve was normal in structure.   8. Grade 2 diastolic dysfunction.   Cardiac catheterization 09/16/2018 Prox RCA to Mid RCA lesion is 25% stenosed. Prox Cx to Mid Cx lesion is 25% stenosed. Ost LAD lesion is 25% stenosed. RPDA lesion is 95%  stenosed. A drug-eluting stent was successfully placed using a STENT SYNERGY DES 2.5X16. Post intervention, there is a 0% residual stenosis. The left ventricular ejection fraction is greater than 65% by visual estimate. There is hyperdynamic left ventricular systolic function. LV end diastolic pressure is normal. LVEDP 7 mm Hg. There is no aortic valve stenosis.   Diagnostic Dominance: Right  Intervention   Risk Assessment/Calculations:              Physical Exam:   VS:  BP 126/74 (BP Location: Left Arm, Patient Position: Sitting, Cuff Size: Normal)   Pulse 72   Ht 5' 5 (1.651 m)   Wt 207 lb (93.9 kg)   LMP  (LMP Unknown)   SpO2 (!) 9%   BMI 34.45 kg/m    Wt Readings from Last 3 Encounters:  01/22/24 207 lb (93.9 kg)  12/05/23 205 lb (93 kg)  10/23/23 207 lb 12.8 oz (94.3 kg)    GEN: Well nourished, well developed in no acute distress NECK: No JVD; No carotid bruits CARDIAC: RRR, no murmurs, rubs, gallops RESPIRATORY:  Clear to auscultation without rales, wheezing or rhonchi  ABDOMEN: Soft, non-tender, non-distended EXTREMITIES:  No edema; No acute deformity      Assessment and Plan:  Coronary artery disease S/p DES to RPDA in 09/2018 - Today and over the past year patient has been without any anginal symptoms, no indication for further ischemic evaluation at this time - Continue Plavix  75 mg daily, Zetia  10 mg daily, rosuvastatin  40 mg daily   Hyperlipidemia LDL 39, HDL 33, TG 109, TC 95 on 10/2023 LDL currently under excellent control and under goal of less than 70 - Continue rosuvastatin  40 mg daily and Zetia  10 mg daily   Hypertension Blood pressure today is well-controlled at 126/74 Home blood pressures range 120s-130s Did experience dizziness with carvedilol  and was subsequently discontinued - Continue metoprolol  tartrate 100 mg 3 times daily and amlodipine  10 mg daily - Continue home BP monitoring   Type 2 diabetes A1c 7.6 on 10/23/2023 - Managed by  PCP on Jardiance , metformin , insulin       Dispo:  Return in about 1 year (around 01/21/2025). She will need to establish with new cardiologist at f/u visit. Former Dr. Jacquelynn Matter patient.   Signed, Ava Boatman, NP

## 2024-01-23 ENCOUNTER — Encounter: Payer: Self-pay | Admitting: Emergency Medicine

## 2024-01-24 ENCOUNTER — Other Ambulatory Visit: Payer: Self-pay | Admitting: Family Medicine

## 2024-01-24 ENCOUNTER — Other Ambulatory Visit: Payer: Self-pay | Admitting: Physician Assistant

## 2024-01-24 DIAGNOSIS — E1165 Type 2 diabetes mellitus with hyperglycemia: Secondary | ICD-10-CM

## 2024-01-30 MED ORDER — CLOPIDOGREL BISULFATE 75 MG PO TABS
75.0000 mg | ORAL_TABLET | Freq: Every day | ORAL | 3 refills | Status: DC
Start: 2024-01-30 — End: 2024-06-11

## 2024-02-05 ENCOUNTER — Encounter: Payer: Self-pay | Admitting: Family Medicine

## 2024-02-05 ENCOUNTER — Other Ambulatory Visit: Payer: Self-pay | Admitting: Family Medicine

## 2024-02-05 DIAGNOSIS — E1165 Type 2 diabetes mellitus with hyperglycemia: Secondary | ICD-10-CM

## 2024-02-05 MED ORDER — FLUCONAZOLE 150 MG PO TABS
150.0000 mg | ORAL_TABLET | Freq: Every day | ORAL | 0 refills | Status: DC
Start: 2024-02-05 — End: 2024-05-18

## 2024-02-05 MED ORDER — LANTUS SOLOSTAR 100 UNIT/ML ~~LOC~~ SOPN: 20.0000 [IU] | PEN_INJECTOR | Freq: Every day | SUBCUTANEOUS | 3 refills | Status: DC

## 2024-02-22 ENCOUNTER — Encounter: Payer: Self-pay | Admitting: Family Medicine

## 2024-02-25 ENCOUNTER — Encounter: Payer: Self-pay | Admitting: Internal Medicine

## 2024-02-25 ENCOUNTER — Ambulatory Visit (INDEPENDENT_AMBULATORY_CARE_PROVIDER_SITE_OTHER): Admitting: Internal Medicine

## 2024-02-25 VITALS — BP 138/88 | HR 81 | Temp 98.1°F | Resp 16 | Ht 65.0 in | Wt 202.5 lb

## 2024-02-25 DIAGNOSIS — R399 Unspecified symptoms and signs involving the genitourinary system: Secondary | ICD-10-CM

## 2024-02-25 DIAGNOSIS — N390 Urinary tract infection, site not specified: Secondary | ICD-10-CM

## 2024-02-25 LAB — URINALYSIS, ROUTINE W REFLEX MICROSCOPIC
Bilirubin Urine: NEGATIVE
Ketones, ur: NEGATIVE
Leukocytes,Ua: NEGATIVE
Nitrite: NEGATIVE
Specific Gravity, Urine: <=1.005 — AB (ref 1.000–1.030)
Total Protein, Urine: NEGATIVE
Urine Glucose: >=1000 — AB
Urobilinogen, UA: 0.2 (ref 0.0–1.0)
pH: 6 (ref 5.0–8.0)

## 2024-02-25 LAB — POC URINALSYSI DIPSTICK (AUTOMATED)
Bilirubin, UA: NEGATIVE
Blood, UA: NEGATIVE
Glucose, UA: POSITIVE — AB
Ketones, UA: NEGATIVE
Leukocytes, UA: NEGATIVE
Nitrite, UA: NEGATIVE
Protein, UA: NEGATIVE
Spec Grav, UA: <=1.005 — AB (ref 1.010–1.025)
Urobilinogen, UA: 0.2 U/dL
pH, UA: 5 (ref 5.0–8.0)

## 2024-02-25 NOTE — Patient Instructions (Signed)
 Drink plenty of fluids  Continue probiotics  Call if not back to normal in 1 week

## 2024-02-25 NOTE — Progress Notes (Signed)
   Subjective:    Patient ID: Danielle Flores, female    DOB: May 29, 1966, 58 y.o.   MRN: [id number]  DOS:  02/25/2024 Type of visit - description: acute  About a week ago developed urinary frequency, only on 1 occasion she saw drops of blood when she wiped after urination. Never had dysuria. She started to take probiotics and actually symptoms have improved.  Denies fever or chills.  No nausea or vomiting. No lower abdominal pain or flank pain. No vaginal discharge or bleeding. No genital rash  Review of Systems See above   Past Medical History:  Diagnosis Date   Hyperglycemia    Hyperlipidemia    Hypertension     Past Surgical History:  Procedure Laterality Date   CESAREAN SECTION     CORONARY STENT INTERVENTION N/A 09/16/2018   Procedure: CORONARY STENT INTERVENTION;  Surgeon: Dann Candyce RAMAN, MD;  Location: [facility] INVASIVE CV LAB;  Service: Cardiovascular;  Laterality: N/A;   LEFT HEART CATH AND CORONARY ANGIOGRAPHY N/A 09/16/2018   Procedure: LEFT HEART CATH AND CORONARY ANGIOGRAPHY;  Surgeon: Dann Candyce RAMAN, MD;  Location: [facility] INVASIVE CV LAB;  Service: Cardiovascular;  Laterality: N/A;    Current Outpatient Medications  Medication Instructions   amLODipine  (NORVASC ) 10 mg, Oral, Daily   blood glucose meter kit and supplies Relion Prime or Dispense other brand based on patient and insurance preference. Use up to four times daily as directed. (FOR ICD-9 250.00, 250.01).   clopidogrel  (PLAVIX ) 75 mg, Oral, Daily   ezetimibe  (ZETIA ) 10 mg, Oral, Daily   fluconazole  (DIFLUCAN ) 150 mg, Oral, Daily, May repeat in 3 days if needed.   insulin  isophane & regular human KwikPen (NOVOLIN  70/30 KWIKPEN) (70-30) 100 UNIT/ML KwikPen 26 Units, Subcutaneous, 2 times daily, Needs appt   Jardiance  25 mg, Oral, Daily before breakfast   Lantus  SoloStar 20 Units, Subcutaneous, Daily at bedtime   metFORMIN  (GLUCOPHAGE ) 500 mg, Oral, 2 times daily with meals   metoprolol  tartrate  (LOPRESSOR ) 100 mg, Oral, 3 times daily   rosuvastatin  (CRESTOR ) 40 mg, Oral, Daily       Objective:   Physical Exam BP 138/88   Pulse 81   Temp 98.1 F (36.7 C) (Oral)   Resp 16   Ht 5' 5 (1.651 m)   Wt 202 lb 8 oz (91.9 kg)   LMP  (LMP Unknown)   SpO2 97%   BMI 33.70 kg/m  General:   Well developed, NAD, BMI noted.  HEENT:  Normocephalic . Face symmetric, atraumatic Abdomen:  Not distended, soft, non-tender. No rebound or rigidity. No CVA tenderness Skin: Not pale. Not jaundice Lower extremities: no pretibial edema bilaterally  Neurologic:  alert & oriented X3.  Speech normal, gait appropriate for age and unassisted Psych--  Cognition and judgment appear intact.  Cooperative with normal attention span and concentration.  Behavior appropriate. No anxious or depressed appearing.     Assessment   58 year old female.  PMH includes DM with history of DKA, CAD, high cholesterol, lupus anticoagulant, sickle cell trait.  UTI: Diabetic patient on Jardiance  presents with symptoms consistent with a UTI. No h/o recurrent UTIs. Currently with no vaginal discharge or genital rash. UDip: Glucose only. Plan: UA, urine culture, antibiotics if appropriate. Continue with probiotics, push fluids, call if not better. DM: Per PCP, pt decided not to start Lantus .

## 2024-02-26 ENCOUNTER — Ambulatory Visit: Payer: Self-pay | Admitting: Internal Medicine

## 2024-02-26 LAB — URINE CULTURE
MICRO NUMBER:: 16723820
Result:: NO GROWTH
SPECIMEN QUALITY:: ADEQUATE

## 2024-02-27 MED ORDER — SULFAMETHOXAZOLE-TRIMETHOPRIM 800-160 MG PO TABS: 1.0000 | ORAL_TABLET | Freq: Two times a day (BID) | ORAL | 0 refills | Status: DC

## 2024-03-19 ENCOUNTER — Other Ambulatory Visit: Payer: Self-pay | Admitting: Family Medicine

## 2024-03-19 DIAGNOSIS — E1165 Type 2 diabetes mellitus with hyperglycemia: Secondary | ICD-10-CM

## 2024-04-06 ENCOUNTER — Other Ambulatory Visit: Payer: Self-pay | Admitting: Family Medicine

## 2024-04-18 ENCOUNTER — Other Ambulatory Visit: Payer: Self-pay | Admitting: Family Medicine

## 2024-04-18 DIAGNOSIS — E1165 Type 2 diabetes mellitus with hyperglycemia: Secondary | ICD-10-CM

## 2024-05-12 ENCOUNTER — Other Ambulatory Visit: Payer: Self-pay | Admitting: Family Medicine

## 2024-05-18 ENCOUNTER — Other Ambulatory Visit: Payer: Self-pay

## 2024-05-18 ENCOUNTER — Inpatient Hospital Stay (HOSPITAL_COMMUNITY)

## 2024-05-18 ENCOUNTER — Inpatient Hospital Stay (HOSPITAL_COMMUNITY)
Admission: EM | Admit: 2024-05-18 | Discharge: 2024-06-04 | DRG: 871 | Disposition: A | Discharge status: Rehabilitation Facility | Attending: Student | Admitting: Student

## 2024-05-18 ENCOUNTER — Emergency Department (HOSPITAL_COMMUNITY)

## 2024-05-18 DIAGNOSIS — Z452 Encounter for adjustment and management of vascular access device: Secondary | ICD-10-CM | POA: Diagnosis not present

## 2024-05-18 DIAGNOSIS — D638 Anemia in other chronic diseases classified elsewhere: Secondary | ICD-10-CM | POA: Diagnosis not present

## 2024-05-18 DIAGNOSIS — Z992 Dependence on renal dialysis: Secondary | ICD-10-CM | POA: Diagnosis not present

## 2024-05-18 DIAGNOSIS — E66811 Obesity, class 1: Secondary | ICD-10-CM | POA: Diagnosis present

## 2024-05-18 DIAGNOSIS — N390 Urinary tract infection, site not specified: Secondary | ICD-10-CM | POA: Diagnosis present

## 2024-05-18 DIAGNOSIS — K7200 Acute and subacute hepatic failure without coma: Secondary | ICD-10-CM | POA: Diagnosis present

## 2024-05-18 DIAGNOSIS — I10 Essential (primary) hypertension: Secondary | ICD-10-CM | POA: Diagnosis not present

## 2024-05-18 DIAGNOSIS — I214 Non-ST elevation (NSTEMI) myocardial infarction: Secondary | ICD-10-CM

## 2024-05-18 DIAGNOSIS — N170 Acute kidney failure with tubular necrosis: Secondary | ICD-10-CM | POA: Diagnosis not present

## 2024-05-18 DIAGNOSIS — I21A1 Myocardial infarction type 2: Secondary | ICD-10-CM | POA: Diagnosis present

## 2024-05-18 DIAGNOSIS — N179 Acute kidney failure, unspecified: Secondary | ICD-10-CM | POA: Diagnosis not present

## 2024-05-18 DIAGNOSIS — Z79899 Other long term (current) drug therapy: Secondary | ICD-10-CM | POA: Diagnosis not present

## 2024-05-18 DIAGNOSIS — E43 Unspecified severe protein-calorie malnutrition: Secondary | ICD-10-CM | POA: Diagnosis not present

## 2024-05-18 DIAGNOSIS — D259 Leiomyoma of uterus, unspecified: Secondary | ICD-10-CM | POA: Diagnosis not present

## 2024-05-18 DIAGNOSIS — J9601 Acute respiratory failure with hypoxia: Secondary | ICD-10-CM | POA: Diagnosis not present

## 2024-05-18 DIAGNOSIS — D62 Acute posthemorrhagic anemia: Secondary | ICD-10-CM | POA: Diagnosis not present

## 2024-05-18 DIAGNOSIS — J189 Pneumonia, unspecified organism: Secondary | ICD-10-CM | POA: Diagnosis present

## 2024-05-18 DIAGNOSIS — E860 Dehydration: Secondary | ICD-10-CM | POA: Diagnosis not present

## 2024-05-18 DIAGNOSIS — Z955 Presence of coronary angioplasty implant and graft: Secondary | ICD-10-CM

## 2024-05-18 DIAGNOSIS — E785 Hyperlipidemia, unspecified: Secondary | ICD-10-CM | POA: Diagnosis present

## 2024-05-18 DIAGNOSIS — R6521 Severe sepsis with septic shock: Secondary | ICD-10-CM | POA: Diagnosis not present

## 2024-05-18 DIAGNOSIS — I2489 Other forms of acute ischemic heart disease: Secondary | ICD-10-CM | POA: Diagnosis not present

## 2024-05-18 DIAGNOSIS — A419 Sepsis, unspecified organism: Secondary | ICD-10-CM | POA: Diagnosis not present

## 2024-05-18 DIAGNOSIS — D6959 Other secondary thrombocytopenia: Secondary | ICD-10-CM | POA: Diagnosis not present

## 2024-05-18 DIAGNOSIS — E871 Hypo-osmolality and hyponatremia: Secondary | ICD-10-CM | POA: Diagnosis not present

## 2024-05-18 DIAGNOSIS — R739 Hyperglycemia, unspecified: Secondary | ICD-10-CM | POA: Diagnosis not present

## 2024-05-18 DIAGNOSIS — Z833 Family history of diabetes mellitus: Secondary | ICD-10-CM

## 2024-05-18 DIAGNOSIS — Z8249 Family history of ischemic heart disease and other diseases of the circulatory system: Secondary | ICD-10-CM

## 2024-05-18 DIAGNOSIS — E1110 Type 2 diabetes mellitus with ketoacidosis without coma: Secondary | ICD-10-CM | POA: Diagnosis present

## 2024-05-18 DIAGNOSIS — Z6831 Body mass index (BMI) 31.0-31.9, adult: Secondary | ICD-10-CM | POA: Diagnosis not present

## 2024-05-18 DIAGNOSIS — D509 Iron deficiency anemia, unspecified: Secondary | ICD-10-CM | POA: Insufficient documentation

## 2024-05-18 DIAGNOSIS — E119 Type 2 diabetes mellitus without complications: Secondary | ICD-10-CM

## 2024-05-18 DIAGNOSIS — R319 Hematuria, unspecified: Secondary | ICD-10-CM | POA: Diagnosis not present

## 2024-05-18 DIAGNOSIS — K5900 Constipation, unspecified: Secondary | ICD-10-CM | POA: Diagnosis not present

## 2024-05-18 DIAGNOSIS — Z794 Long term (current) use of insulin: Secondary | ICD-10-CM

## 2024-05-18 DIAGNOSIS — Z888 Allergy status to other drugs, medicaments and biological substances status: Secondary | ICD-10-CM

## 2024-05-18 DIAGNOSIS — J690 Pneumonitis due to inhalation of food and vomit: Secondary | ICD-10-CM | POA: Diagnosis not present

## 2024-05-18 DIAGNOSIS — D696 Thrombocytopenia, unspecified: Secondary | ICD-10-CM | POA: Diagnosis not present

## 2024-05-18 DIAGNOSIS — F32A Depression, unspecified: Secondary | ICD-10-CM | POA: Diagnosis present

## 2024-05-18 DIAGNOSIS — R5381 Other malaise: Secondary | ICD-10-CM | POA: Diagnosis not present

## 2024-05-18 DIAGNOSIS — E440 Moderate protein-calorie malnutrition: Secondary | ICD-10-CM | POA: Diagnosis not present

## 2024-05-18 DIAGNOSIS — R6520 Severe sepsis without septic shock: Secondary | ICD-10-CM | POA: Diagnosis not present

## 2024-05-18 DIAGNOSIS — E876 Hypokalemia: Secondary | ICD-10-CM | POA: Diagnosis present

## 2024-05-18 DIAGNOSIS — R0989 Other specified symptoms and signs involving the circulatory and respiratory systems: Secondary | ICD-10-CM | POA: Diagnosis not present

## 2024-05-18 DIAGNOSIS — I2510 Atherosclerotic heart disease of native coronary artery without angina pectoris: Secondary | ICD-10-CM | POA: Diagnosis not present

## 2024-05-18 DIAGNOSIS — E8770 Fluid overload, unspecified: Secondary | ICD-10-CM | POA: Diagnosis not present

## 2024-05-18 DIAGNOSIS — R0602 Shortness of breath: Secondary | ICD-10-CM | POA: Diagnosis not present

## 2024-05-18 DIAGNOSIS — R34 Anuria and oliguria: Secondary | ICD-10-CM | POA: Diagnosis not present

## 2024-05-18 DIAGNOSIS — Z23 Encounter for immunization: Secondary | ICD-10-CM | POA: Diagnosis not present

## 2024-05-18 DIAGNOSIS — I700 Atherosclerosis of aorta: Secondary | ICD-10-CM | POA: Diagnosis not present

## 2024-05-18 DIAGNOSIS — R918 Other nonspecific abnormal finding of lung field: Secondary | ICD-10-CM | POA: Diagnosis not present

## 2024-05-18 DIAGNOSIS — R0902 Hypoxemia: Secondary | ICD-10-CM | POA: Diagnosis not present

## 2024-05-18 DIAGNOSIS — R7989 Other specified abnormal findings of blood chemistry: Secondary | ICD-10-CM | POA: Diagnosis not present

## 2024-05-18 DIAGNOSIS — R0609 Other forms of dyspnea: Secondary | ICD-10-CM | POA: Diagnosis not present

## 2024-05-18 DIAGNOSIS — Z7984 Long term (current) use of oral hypoglycemic drugs: Secondary | ICD-10-CM

## 2024-05-18 DIAGNOSIS — Z7902 Long term (current) use of antithrombotics/antiplatelets: Secondary | ICD-10-CM

## 2024-05-18 HISTORY — DX: Sepsis, unspecified organism: A41.9

## 2024-05-18 HISTORY — DX: Type 2 diabetes mellitus without complications: E11.9

## 2024-05-18 HISTORY — DX: Acute kidney failure, unspecified: N17.9

## 2024-05-18 HISTORY — DX: Type 2 diabetes mellitus with ketoacidosis without coma: E11.10

## 2024-05-18 LAB — CBC WITH DIFFERENTIAL/PLATELET
Basophils Absolute: 0 K/uL (ref 0.0–0.1)
Basophils Absolute: 0 K/uL (ref 0.0–0.1)
Basophils Relative: 0 %
Basophils Relative: 0 %
Eosinophils Absolute: 0 K/uL (ref 0.0–0.5)
Eosinophils Absolute: 0 K/uL (ref 0.0–0.5)
Eosinophils Relative: 0 %
Eosinophils Relative: 0 %
HCT: 40.1 % (ref 36.0–46.0)
HCT: 40.8 % (ref 36.0–46.0)
Hemoglobin: 13.8 g/dL (ref 12.0–15.0)
Hemoglobin: 13.8 g/dL (ref 12.0–15.0)
Lymphocytes Relative: 17 %
Lymphocytes Relative: 18 %
Lymphs Abs: 2.4 K/uL (ref 0.7–4.0)
Lymphs Abs: 2.4 K/uL (ref 0.7–4.0)
MCH: 23.9 pg — ABNORMAL LOW (ref 26.0–34.0)
MCH: 23.7 pg — ABNORMAL LOW (ref 26.0–34.0)
MCHC: 34.4 g/dL (ref 30.0–36.0)
MCHC: 33.8 g/dL (ref 30.0–36.0)
MCV: 69.5 fL — ABNORMAL LOW (ref 80.0–100.0)
MCV: 70.1 fL — ABNORMAL LOW (ref 80.0–100.0)
Monocytes Absolute: 1 K/uL (ref 0.1–1.0)
Monocytes Absolute: 0.5 K/uL (ref 0.1–1.0)
Monocytes Relative: 7 %
Monocytes Relative: 4 %
Neutro Abs: 10.9 K/uL — ABNORMAL HIGH (ref 1.7–7.7)
Neutro Abs: 10.5 K/uL — ABNORMAL HIGH (ref 1.7–7.7)
Neutrophils Relative %: 76 %
Neutrophils Relative %: 78 %
Platelets: 161 K/uL (ref 150–400)
Platelets: 127 K/uL — ABNORMAL LOW (ref 150–400)
RBC: 5.77 MIL/uL — ABNORMAL HIGH (ref 3.87–5.11)
RBC: 5.82 MIL/uL — ABNORMAL HIGH (ref 3.87–5.11)
RDW: 14.7 % (ref 11.5–15.5)
RDW: 15.4 % (ref 11.5–15.5)
WBC: 14.3 K/uL — ABNORMAL HIGH (ref 4.0–10.5)
WBC: 13.4 K/uL — ABNORMAL HIGH (ref 4.0–10.5)
nRBC: 0.1 % (ref 0.0–0.2)
nRBC: 0 % (ref 0.0–0.2)

## 2024-05-18 LAB — BASIC METABOLIC PANEL WITH GFR
Anion gap: 24 — ABNORMAL HIGH (ref 5–15)
Anion gap: 16 — ABNORMAL HIGH (ref 5–15)
Anion gap: 15 (ref 5–15)
Anion gap: 16 — ABNORMAL HIGH (ref 5–15)
BUN: 22 mg/dL — ABNORMAL HIGH (ref 6–20)
BUN: 23 mg/dL — ABNORMAL HIGH (ref 6–20)
BUN: 24 mg/dL — ABNORMAL HIGH (ref 6–20)
BUN: 25 mg/dL — ABNORMAL HIGH (ref 6–20)
CO2: 17 mmol/L — ABNORMAL LOW (ref 22–32)
CO2: 17 mmol/L — ABNORMAL LOW (ref 22–32)
CO2: 16 mmol/L — ABNORMAL LOW (ref 22–32)
CO2: 13 mmol/L — ABNORMAL LOW (ref 22–32)
Calcium: 7.8 mg/dL — ABNORMAL LOW (ref 8.9–10.3)
Calcium: 7.8 mg/dL — ABNORMAL LOW (ref 8.9–10.3)
Calcium: 7.8 mg/dL — ABNORMAL LOW (ref 8.9–10.3)
Calcium: 7.2 mg/dL — ABNORMAL LOW (ref 8.9–10.3)
Chloride: 91 mmol/L — ABNORMAL LOW (ref 98–111)
Chloride: 96 mmol/L — ABNORMAL LOW (ref 98–111)
Chloride: 97 mmol/L — ABNORMAL LOW (ref 98–111)
Chloride: 96 mmol/L — ABNORMAL LOW (ref 98–111)
Creatinine, Ser: 3.37 mg/dL — ABNORMAL HIGH (ref 0.44–1.00)
Creatinine, Ser: 3.58 mg/dL — ABNORMAL HIGH (ref 0.44–1.00)
Creatinine, Ser: 3.78 mg/dL — ABNORMAL HIGH (ref 0.44–1.00)
Creatinine, Ser: 4.06 mg/dL — ABNORMAL HIGH (ref 0.44–1.00)
GFR, Estimated: 15 mL/min — ABNORMAL LOW (ref >60)
GFR, Estimated: 14 mL/min — ABNORMAL LOW (ref >60)
GFR, Estimated: 13 mL/min — ABNORMAL LOW (ref >60)
GFR, Estimated: 12 mL/min — ABNORMAL LOW (ref >60)
Glucose, Bld: 236 mg/dL — ABNORMAL HIGH (ref 70–99)
Glucose, Bld: 224 mg/dL — ABNORMAL HIGH (ref 70–99)
Glucose, Bld: 252 mg/dL — ABNORMAL HIGH (ref 70–99)
Glucose, Bld: 338 mg/dL — ABNORMAL HIGH (ref 70–99)
Potassium: 3 mmol/L — ABNORMAL LOW (ref 3.5–5.1)
Potassium: 3.2 mmol/L — ABNORMAL LOW (ref 3.5–5.1)
Potassium: 3.9 mmol/L (ref 3.5–5.1)
Potassium: 4.8 mmol/L (ref 3.5–5.1)
Sodium: 132 mmol/L — ABNORMAL LOW (ref 135–145)
Sodium: 129 mmol/L — ABNORMAL LOW (ref 135–145)
Sodium: 128 mmol/L — ABNORMAL LOW (ref 135–145)
Sodium: 125 mmol/L — ABNORMAL LOW (ref 135–145)

## 2024-05-18 LAB — POCT I-STAT 7, (LYTES, BLD GAS, ICA,H+H)
Acid-base deficit: 10 mmol/L — ABNORMAL HIGH (ref 0.0–2.0)
Bicarbonate: 12.3 mmol/L — ABNORMAL LOW (ref 20.0–28.0)
Calcium, Ion: 1.05 mmol/L — ABNORMAL LOW (ref 1.15–1.40)
HCT: 38 % (ref 36.0–46.0)
Hemoglobin: 12.9 g/dL (ref 12.0–15.0)
O2 Saturation: 95 %
Patient temperature: 100.1
Potassium: 4.6 mmol/L (ref 3.5–5.1)
Sodium: 127 mmol/L — ABNORMAL LOW (ref 135–145)
TCO2: 13 mmol/L — ABNORMAL LOW (ref 22–32)
pCO2 arterial: 20.4 mmHg — ABNORMAL LOW (ref 32–48)
pH, Arterial: 7.392 (ref 7.35–7.45)
pO2, Arterial: 77 mmHg — ABNORMAL LOW (ref 83–108)

## 2024-05-18 LAB — CBC
HCT: 38 % (ref 36.0–46.0)
Hemoglobin: 13.1 g/dL (ref 12.0–15.0)
MCH: 23.9 pg — ABNORMAL LOW (ref 26.0–34.0)
MCHC: 34.5 g/dL (ref 30.0–36.0)
MCV: 69.3 fL — ABNORMAL LOW (ref 80.0–100.0)
Platelets: 118 K/uL — ABNORMAL LOW (ref 150–400)
RBC: 5.48 MIL/uL — ABNORMAL HIGH (ref 3.87–5.11)
RDW: 14.9 % (ref 11.5–15.5)
WBC: 10.4 K/uL (ref 4.0–10.5)
nRBC: 0 % (ref 0.0–0.2)

## 2024-05-18 LAB — RESPIRATORY PANEL BY PCR

## 2024-05-18 LAB — COMPREHENSIVE METABOLIC PANEL WITH GFR
ALT: 62 U/L — ABNORMAL HIGH (ref 0–44)
AST: 233 U/L — ABNORMAL HIGH (ref 15–41)
Albumin: 2.6 g/dL — ABNORMAL LOW (ref 3.5–5.0)
Alkaline Phosphatase: 84 U/L (ref 38–126)
Anion gap: 24 — ABNORMAL HIGH (ref 5–15)
BUN: 18 mg/dL (ref 6–20)
CO2: 12 mmol/L — ABNORMAL LOW (ref 22–32)
Calcium: 8.4 mg/dL — ABNORMAL LOW (ref 8.9–10.3)
Chloride: 90 mmol/L — ABNORMAL LOW (ref 98–111)
Creatinine, Ser: 2.96 mg/dL — ABNORMAL HIGH (ref 0.44–1.00)
GFR, Estimated: 18 mL/min — ABNORMAL LOW (ref >60)
Glucose, Bld: 524 mg/dL (ref 70–99)
Potassium: 2.9 mmol/L — ABNORMAL LOW (ref 3.5–5.1)
Sodium: 126 mmol/L — ABNORMAL LOW (ref 135–145)
Total Bilirubin: 0.9 mg/dL (ref 0.0–1.2)
Total Protein: 7.3 g/dL (ref 6.5–8.1)

## 2024-05-18 LAB — GLUCOSE, CAPILLARY
Glucose-Capillary: 183 mg/dL — ABNORMAL HIGH (ref 70–99)
Glucose-Capillary: 194 mg/dL — ABNORMAL HIGH (ref 70–99)
Glucose-Capillary: 213 mg/dL — ABNORMAL HIGH (ref 70–99)
Glucose-Capillary: 213 mg/dL — ABNORMAL HIGH (ref 70–99)
Glucose-Capillary: 227 mg/dL — ABNORMAL HIGH (ref 70–99)
Glucose-Capillary: 228 mg/dL — ABNORMAL HIGH (ref 70–99)
Glucose-Capillary: 236 mg/dL — ABNORMAL HIGH (ref 70–99)
Glucose-Capillary: 246 mg/dL — ABNORMAL HIGH (ref 70–99)
Glucose-Capillary: 279 mg/dL — ABNORMAL HIGH (ref 70–99)
Glucose-Capillary: 284 mg/dL — ABNORMAL HIGH (ref 70–99)
Glucose-Capillary: 293 mg/dL — ABNORMAL HIGH (ref 70–99)
Glucose-Capillary: 343 mg/dL — ABNORMAL HIGH (ref 70–99)

## 2024-05-18 LAB — CBG MONITORING, ED
Glucose-Capillary: 538 mg/dL (ref 70–99)
Glucose-Capillary: 491 mg/dL — ABNORMAL HIGH (ref 70–99)
Glucose-Capillary: 533 mg/dL (ref 70–99)
Glucose-Capillary: 433 mg/dL — ABNORMAL HIGH (ref 70–99)
Glucose-Capillary: 344 mg/dL — ABNORMAL HIGH (ref 70–99)

## 2024-05-18 LAB — HEPARIN LEVEL (UNFRACTIONATED): Heparin Unfractionated: 0.38 [IU]/mL (ref 0.30–0.70)

## 2024-05-18 LAB — MRSA NEXT GEN BY PCR, NASAL: MRSA by PCR Next Gen: NOT DETECTED

## 2024-05-18 LAB — URINALYSIS, ROUTINE W REFLEX MICROSCOPIC
Bilirubin Urine: NEGATIVE
Glucose, UA: 150 mg/dL — AB
Ketones, ur: NEGATIVE mg/dL
Leukocytes,Ua: NEGATIVE
Nitrite: NEGATIVE
Protein, ur: >=300 mg/dL — AB
Specific Gravity, Urine: 1.016 (ref 1.005–1.030)
pH: 5 (ref 5.0–8.0)

## 2024-05-18 LAB — HIV ANTIBODY (ROUTINE TESTING W REFLEX): HIV Screen 4th Generation wRfx: NONREACTIVE

## 2024-05-18 LAB — RESP PANEL BY RT-PCR (RSV, FLU A&B, COVID)  RVPGX2
Influenza A by PCR: NEGATIVE
Influenza B by PCR: NEGATIVE
Resp Syncytial Virus by PCR: NEGATIVE
SARS Coronavirus 2 by RT PCR: NEGATIVE

## 2024-05-18 LAB — TROPONIN I (HIGH SENSITIVITY)
Troponin I (High Sensitivity): 1322 ng/L (ref <18)
Troponin I (High Sensitivity): 1521 ng/L (ref <18)
Troponin I (High Sensitivity): 1781 ng/L (ref <18)
Troponin I (High Sensitivity): 2036 ng/L (ref <18)
Troponin I (High Sensitivity): 1254 ng/L (ref <18)

## 2024-05-18 LAB — LACTIC ACID, PLASMA
Lactic Acid, Venous: 4.7 mmol/L (ref 0.5–1.9)
Lactic Acid, Venous: 3 mmol/L (ref 0.5–1.9)
Lactic Acid, Venous: 3.8 mmol/L (ref 0.5–1.9)

## 2024-05-18 LAB — PHOSPHORUS
Phosphorus: 1.6 mg/dL — ABNORMAL LOW (ref 2.5–4.6)
Phosphorus: 3 mg/dL (ref 2.5–4.6)

## 2024-05-18 LAB — HEPATITIS PANEL, ACUTE
HCV Ab: NONREACTIVE
Hep A IgM: NONREACTIVE
Hep B C IgM: NONREACTIVE
Hepatitis B Surface Ag: NONREACTIVE

## 2024-05-18 LAB — BETA-HYDROXYBUTYRIC ACID
Beta-Hydroxybutyric Acid: 1.3 mmol/L — ABNORMAL HIGH (ref 0.05–0.27)
Beta-Hydroxybutyric Acid: 0.19 mmol/L (ref 0.05–0.27)
Beta-Hydroxybutyric Acid: 0.2 mmol/L (ref 0.05–0.27)
Beta-Hydroxybutyric Acid: 0.19 mmol/L (ref 0.05–0.27)

## 2024-05-18 LAB — PROTIME-INR
INR: 1.2 (ref 0.8–1.2)
Prothrombin Time: 15.8 s — ABNORMAL HIGH (ref 11.4–15.2)

## 2024-05-18 LAB — BRAIN NATRIURETIC PEPTIDE: B Natriuretic Peptide: 95.1 pg/mL (ref 0.0–100.0)

## 2024-05-18 LAB — PROCALCITONIN: Procalcitonin: 76.72 ng/mL

## 2024-05-18 LAB — MAGNESIUM
Magnesium: 1.6 mg/dL — ABNORMAL LOW (ref 1.7–2.4)
Magnesium: 2.4 mg/dL (ref 1.7–2.4)

## 2024-05-18 MED ORDER — CLOPIDOGREL BISULFATE 75 MG PO TABS
75.0000 mg | ORAL_TABLET | Freq: Every day | ORAL | Status: DC
  Administered 2024-05-18 – 2024-06-04 (×18): 75 mg via ORAL
  Filled 2024-05-18 (×18): qty 1

## 2024-05-18 MED ORDER — DEXTROSE IN LACTATED RINGERS 5 % IV SOLN: INTRAVENOUS | Status: AC

## 2024-05-18 MED ORDER — MAGNESIUM SULFATE 4 GM/100ML IV SOLN
4.0000 g | Freq: Once | INTRAVENOUS | Status: AC
  Administered 2024-05-18: 4 g via INTRAVENOUS
  Filled 2024-05-18: qty 100

## 2024-05-18 MED ORDER — SODIUM CHLORIDE 0.9 % IV SOLN
3.0000 g | Freq: Two times a day (BID) | INTRAVENOUS | Status: DC
  Administered 2024-05-18 – 2024-05-19 (×2): 3 g via INTRAVENOUS
  Filled 2024-05-18 (×2): qty 8

## 2024-05-18 MED ORDER — HEPARIN (PORCINE) 25000 UT/250ML-% IV SOLN
950.0000 [IU]/h | INTRAVENOUS | Status: DC
  Administered 2024-05-18 – 2024-05-19 (×2): 950 [IU]/h via INTRAVENOUS
  Filled 2024-05-18 (×2): qty 250

## 2024-05-18 MED ORDER — VANCOMYCIN HCL IN DEXTROSE 1-5 GM/200ML-% IV SOLN: 1000.0000 mg | Freq: Once | INTRAVENOUS | Status: DC

## 2024-05-18 MED ORDER — ACETAMINOPHEN 325 MG PO TABS
650.0000 mg | ORAL_TABLET | Freq: Four times a day (QID) | ORAL | Status: DC | PRN
Start: 2024-05-18 — End: 2024-06-04
  Administered 2024-05-19 – 2024-06-02 (×7): 650 mg via ORAL
  Filled 2024-05-18 (×7): qty 2

## 2024-05-18 MED ORDER — VANCOMYCIN HCL 2000 MG/400ML IV SOLN
2000.0000 mg | Freq: Once | INTRAVENOUS | Status: AC
  Administered 2024-05-18: 2000 mg via INTRAVENOUS
  Filled 2024-05-18: qty 400

## 2024-05-18 MED ORDER — ACETAMINOPHEN 325 MG PO TABS
650.0000 mg | ORAL_TABLET | Freq: Once | ORAL | Status: AC
  Administered 2024-05-18: 650 mg via ORAL
  Filled 2024-05-18: qty 2

## 2024-05-18 MED ORDER — ASPIRIN 81 MG PO CHEW
81.0000 mg | CHEWABLE_TABLET | Freq: Every day | ORAL | Status: DC
  Administered 2024-05-19: 81 mg via ORAL
  Filled 2024-05-18: qty 1

## 2024-05-18 MED ORDER — HEPARIN BOLUS VIA INFUSION
3500.0000 [IU] | Freq: Once | INTRAVENOUS | Status: AC
  Administered 2024-05-18: 3500 [IU] via INTRAVENOUS
  Filled 2024-05-18: qty 3500

## 2024-05-18 MED ORDER — POTASSIUM PHOSPHATES 15 MMOLE/5ML IV SOLN
30.0000 mmol | Freq: Once | INTRAVENOUS | Status: AC
  Administered 2024-05-18: 30 mmol via INTRAVENOUS
  Filled 2024-05-18: qty 10

## 2024-05-18 MED ORDER — POTASSIUM CHLORIDE CRYS ER 20 MEQ PO TBCR
40.0000 meq | EXTENDED_RELEASE_TABLET | Freq: Once | ORAL | Status: DC
  Filled 2024-05-18: qty 2

## 2024-05-18 MED ORDER — SODIUM CHLORIDE 0.9 % IV SOLN
100.0000 mg | Freq: Two times a day (BID) | INTRAVENOUS | Status: DC
  Administered 2024-05-18 – 2024-05-19 (×2): 100 mg via INTRAVENOUS
  Filled 2024-05-18 (×3): qty 100

## 2024-05-18 MED ORDER — DEXTROSE 50 % IV SOLN: 0.0000 mL | INTRAVENOUS | Status: DC | PRN

## 2024-05-18 MED ORDER — LACTATED RINGERS IV BOLUS (SEPSIS)
250.0000 mL | Freq: Once | INTRAVENOUS | Status: AC
  Administered 2024-05-18: 250 mL via INTRAVENOUS

## 2024-05-18 MED ORDER — LACTATED RINGERS IV SOLN
INTRAVENOUS | Status: AC
Start: 2024-05-18 — End: 2024-05-19

## 2024-05-18 MED ORDER — LACTATED RINGERS IV BOLUS
1000.0000 mL | Freq: Once | INTRAVENOUS | Status: AC
  Administered 2024-05-18: 1000 mL via INTRAVENOUS

## 2024-05-18 MED ORDER — MORPHINE SULFATE (PF) 2 MG/ML IV SOLN
1.0000 mg | INTRAVENOUS | Status: DC | PRN
  Administered 2024-05-18: 1 mg via INTRAVENOUS
  Filled 2024-05-18 (×2): qty 1

## 2024-05-18 MED ORDER — DOCUSATE SODIUM 100 MG PO CAPS: 100.0000 mg | ORAL_CAPSULE | Freq: Two times a day (BID) | ORAL | Status: DC | PRN

## 2024-05-18 MED ORDER — POTASSIUM CHLORIDE 20 MEQ PO PACK
40.0000 meq | PACK | Freq: Once | ORAL | Status: AC
  Administered 2024-05-18: 40 meq via ORAL
  Filled 2024-05-18: qty 2

## 2024-05-18 MED ORDER — LACTATED RINGERS IV SOLN: INTRAVENOUS | Status: AC

## 2024-05-18 MED ORDER — ROSUVASTATIN CALCIUM 20 MG PO TABS
20.0000 mg | ORAL_TABLET | Freq: Every day | ORAL | Status: DC
  Administered 2024-05-18: 20 mg via ORAL
  Filled 2024-05-18: qty 1

## 2024-05-18 MED ORDER — ORAL CARE MOUTH RINSE: 15.0000 mL | OROMUCOSAL | Status: DC | PRN

## 2024-05-18 MED ORDER — SODIUM CHLORIDE 0.9 % IV SOLN: 2.0000 g | INTRAVENOUS | Status: DC

## 2024-05-18 MED ORDER — VANCOMYCIN HCL 750 MG/150ML IV SOLN: 750.0000 mg | INTRAVENOUS | Status: DC

## 2024-05-18 MED ORDER — METRONIDAZOLE 500 MG/100ML IV SOLN
500.0000 mg | Freq: Once | INTRAVENOUS | Status: AC
  Administered 2024-05-18: 500 mg via INTRAVENOUS
  Filled 2024-05-18: qty 100

## 2024-05-18 MED ORDER — POTASSIUM CHLORIDE 10 MEQ/100ML IV SOLN
10.0000 meq | INTRAVENOUS | Status: AC
  Administered 2024-05-18 (×4): 10 meq via INTRAVENOUS
  Filled 2024-05-18 (×3): qty 100

## 2024-05-18 MED ORDER — INSULIN ASPART 100 UNIT/ML IJ SOLN: 0.0000 [IU] | INTRAMUSCULAR | Status: DC

## 2024-05-18 MED ORDER — LACTATED RINGERS IV BOLUS
20.0000 mL/kg | Freq: Once | INTRAVENOUS | Status: AC
  Administered 2024-05-18: 1860 mL via INTRAVENOUS

## 2024-05-18 MED ORDER — SODIUM CHLORIDE 0.9 % IV SOLN
2.0000 g | Freq: Once | INTRAVENOUS | Status: AC
  Administered 2024-05-18: 2 g via INTRAVENOUS
  Filled 2024-05-18: qty 12.5

## 2024-05-18 MED ORDER — LACTATED RINGERS IV BOLUS (SEPSIS)
500.0000 mL | Freq: Once | INTRAVENOUS | Status: AC
  Administered 2024-05-18: 500 mL via INTRAVENOUS

## 2024-05-18 MED ORDER — CHLORHEXIDINE GLUCONATE CLOTH 2 % EX PADS
6.0000 | MEDICATED_PAD | Freq: Every day | CUTANEOUS | Status: DC
  Administered 2024-05-18 – 2024-05-24 (×7): 6 via TOPICAL

## 2024-05-18 MED ORDER — INSULIN REGULAR(HUMAN) IN NACL 100-0.9 UT/100ML-% IV SOLN
INTRAVENOUS | Status: AC
  Administered 2024-05-18 (×2): 8 [IU]/h via INTRAVENOUS
  Administered 2024-05-19: 7 [IU]/h via INTRAVENOUS
  Administered 2024-05-19: 5.5 [IU]/h via INTRAVENOUS
  Filled 2024-05-18 (×5): qty 100

## 2024-05-18 MED ORDER — LACTATED RINGERS IV BOLUS (SEPSIS)
1000.0000 mL | Freq: Once | INTRAVENOUS | Status: AC
  Administered 2024-05-18: 1000 mL via INTRAVENOUS

## 2024-05-18 MED ORDER — POTASSIUM CHLORIDE CRYS ER 20 MEQ PO TBCR
40.0000 meq | EXTENDED_RELEASE_TABLET | Freq: Once | ORAL | Status: AC
  Administered 2024-05-18: 40 meq via ORAL
  Filled 2024-05-18: qty 2

## 2024-05-18 MED ORDER — POTASSIUM CHLORIDE 10 MEQ/100ML IV SOLN
10.0000 meq | INTRAVENOUS | Status: AC
  Administered 2024-05-18 (×2): 10 meq via INTRAVENOUS
  Filled 2024-05-18: qty 100

## 2024-05-18 MED ORDER — ASPIRIN 81 MG PO CHEW
324.0000 mg | CHEWABLE_TABLET | Freq: Once | ORAL | Status: AC
  Administered 2024-05-18: 324 mg via ORAL
  Filled 2024-05-18: qty 4

## 2024-05-18 MED ORDER — ONDANSETRON HCL 4 MG/2ML IJ SOLN
4.0000 mg | Freq: Four times a day (QID) | INTRAMUSCULAR | Status: DC | PRN
  Administered 2024-06-01 – 2024-06-03 (×2): 4 mg via INTRAVENOUS
  Filled 2024-05-18 (×2): qty 2

## 2024-05-18 NOTE — ED Notes (Signed)
 Pt husband leaving, wanting an update when ICU bed is ready.

## 2024-05-18 NOTE — ED Notes (Signed)
 After many attempts, the lab collection was unsuccessful. Phlebotomy has attempted and will let morning team know to get labs. Delay in lab collection because of difficulty obtaining them.

## 2024-05-18 NOTE — Progress Notes (Addendum)
 eLink Physician-Brief Progress Note Patient Name: Danielle Flores DOB: 05-25-1966 MRN: [id number]   Date of Service  05/18/2024  HPI/Events of Note  Notified of increasing O2 needs. Was previously on 40% fio2 during day, then made to 50% and now at 70% fio2. Tachypnea is unchanged per RN. Fully awake alert and not on pressors. Is on DKA protocol and heparin  drips. With DKA, sepsis, AKI and NSTEMI this time. Worsening renal failure.   eICU Interventions  Check CXR and ABG now Keep NPO High risk for needing ETT Do not see ABG from this admission so will do this to check Current o2 sat is 92 on 70% fio2     Intervention Category Major Interventions: Respiratory failure - evaluation and management  Karolyn Messing G Larry Knipp 05/18/2024, 8:09 PM  920 pm - reviewed cxr and abg. Dr Maree from [facility] had also assessed at bedside.

## 2024-05-18 NOTE — ED Notes (Signed)
 Attempted to collect blood after multiple attempts at pulling from IV and straight sticks, will ask lab to draw morning labs

## 2024-05-18 NOTE — H&P (Signed)
 NAME:  Danielle Flores, MRN:  [id number], DOB:  1966/06/28, LOS: 0 ADMISSION DATE:  05/18/2024, CONSULTATION DATE:  05/18/2024 REFERRING MD:  April Palumbo, MD, CHIEF COMPLAINT:  SOB  History of Present Illness:  58 y/o female with PMH for DMT2 on insulin , HTN, HLD, CAD s/p MI 09/2018, S/p 1 coronary stent who presents with worsening flu like symptoms.  She feels she has a the flu.  She has not had a swab or definitive testing but felt like she had the flu.  She c/o N/V x 3 days and no diarrhea, she had generalized weakness.  She has been taking her insulin .  She says she has been getting worse rather than better. In the ED she was found to have NSTEMI with increased Toponin 1,322, BG on chenistry 524, K+ 2.9, WBC 14.3, NA 126, Bicarb 12, AG 24.  Covid, flu and RSSV negative. Pertinent  Medical History  DMT2 on insulin , HTN, HLD, CAD s/p MI 09/2018, S/p 1 coronary stent   Significant Hospital Events: Including procedures, antibiotic start and stop dates in addition to other pertinent events   10/12: admit to ICU.  Interim History / Subjective:  N/a  Objective    Blood pressure (!) 111/58, pulse (!) 118, temperature (!) 102.6 F (39.2 C), temperature source Oral, resp. rate (!) 25, height 5' 5 (1.651 m), weight 93 kg, SpO2 96%.       No intake or output data in the 24 hours ending 05/18/24 0341 Filed Weights   05/18/24 0305  Weight: 93 kg    Examination: General: alert tachypic mild distress, on BiPAP HENT: PERRLA no icterus, EOMI, supple no LN Lungs: CTA no wheezes rales or rhonchi Cardiovascular: reg s1s2 no murmurs, gallops or rubs Abdomen: soft obese, NT ND BS pos, no guarding Extremities: no cyanosis, clubbing or edema Neuro: AAO x 3 , CN II to XII grossly intact GU: n/a  Resolved problem list   Assessment and Plan  DKA Insulin  drip and FS q1-2 hours Monitor AG IV fluids NSTEMI Heparin  drip Monitor Troponins AKI IV fluids Monitor serum AKI UTI Add  antibiotics SOB Likely Kussmaul breathing   Labs   CBC: Recent Labs  Lab 05/18/24 0156  WBC 14.3*  NEUTROABS 10.9*  HGB 13.8  HCT 40.1  MCV 69.5*  PLT 161    Basic Metabolic Panel: Recent Labs  Lab 05/18/24 0156  NA 126*  K 2.9*  CL 90*  CO2 12*  GLUCOSE 524*  BUN 18  CREATININE 2.96*  CALCIUM  8.4*   GFR: Estimated Creatinine Clearance: 22.8 mL/min (A) (by C-G formula based on SCr of 2.96 mg/dL (H)). Recent Labs  Lab 05/18/24 0156  WBC 14.3*    Liver Function Tests: Recent Labs  Lab 05/18/24 0156  AST 233*  ALT 62*  ALKPHOS 84  BILITOT 0.9  PROT 7.3  ALBUMIN 2.6*   No results for input(s): LIPASE, AMYLASE in the last 168 hours. No results for input(s): AMMONIA in the last 168 hours.  ABG    Component Value Date/Time   HCO3 10.7 (L) 09/13/2018 0123   TCO2 12 (L) 09/13/2018 0123   ACIDBASEDEF 16.0 (H) 09/13/2018 0123   O2SAT 59.0 09/13/2018 0123     Coagulation Profile: Recent Labs  Lab 05/18/24 0156  INR 1.2    Cardiac Enzymes: No results for input(s): CKTOTAL, CKMB, CKMBINDEX, TROPONINI in the last 168 hours.  HbA1C: Hgb A1c MFr Bld  Date/Time Value Ref Range Status  10/23/2023 03:28 PM 7.6 (  H) 4.6 - 6.5 % Final    Comment:    Glycemic Control Guidelines for People with Diabetes:Non Diabetic:  <6%Goal of Therapy: <7%Additional Action Suggested:  >8%   08/10/2023 02:37 PM 11.8 (H) <5.7 % of total Hgb Final    Comment:    For someone without known diabetes, a hemoglobin A1c value of 6.5% or greater indicates that they may have  diabetes and this should be confirmed with a follow-up  test. . For someone with known diabetes, a value <7% indicates  that their diabetes is well controlled and a value  greater than or equal to 7% indicates suboptimal  control. A1c targets should be individualized based on  duration of diabetes, age, comorbid conditions, and  other considerations. . Currently, no consensus exists  regarding use of hemoglobin A1c for diagnosis of diabetes for children. .     CBG: No results for input(s): GLUCAP in the last 168 hours.  Review of Systems:   Chills, cough, jittery, N/V, no diarrhea, no chest pain no abd pain  Past Medical History:  She,  has a past medical history of Hyperglycemia, Hyperlipidemia, and Hypertension.   Surgical History:   Past Surgical History:  Procedure Laterality Date   CESAREAN SECTION     CORONARY STENT INTERVENTION N/A 09/16/2018   Procedure: CORONARY STENT INTERVENTION;  Surgeon: Dann Candyce RAMAN, MD;  Location: [facility] INVASIVE CV LAB;  Service: Cardiovascular;  Laterality: N/A;   LEFT HEART CATH AND CORONARY ANGIOGRAPHY N/A 09/16/2018   Procedure: LEFT HEART CATH AND CORONARY ANGIOGRAPHY;  Surgeon: Dann Candyce RAMAN, MD;  Location: [facility] INVASIVE CV LAB;  Service: Cardiovascular;  Laterality: N/A;     Social History:   reports that she has never smoked. She has never used smokeless tobacco. She reports current alcohol use. She reports that she does not use drugs.   Family History:  Her family history includes Diabetes in her mother, sister, and another family member; Heart disease in her maternal grandmother; Sickle cell anemia in her paternal aunt.   Allergies Allergies  Allergen Reactions   Lisinopril  Hives, Itching and Swelling    Lip swelling, itching from head to toe, hives chest, breast, and abdomen.   Atorvastatin  Other (See Comments)    Myalgias     Home Medications  Prior to Admission medications   Medication Sig Start Date End Date Taking? Authorizing Provider  amLODipine  (NORVASC ) 10 MG tablet Take 1 tablet (10 mg total) by mouth daily. 04/08/24   Antonio Cyndee Rockers R, DO  blood glucose meter kit and supplies Rel[employer] Prime or Dispense other brand based on patient and insurance preference. Use up to four times daily as directed. (FOR ICD-9 250.00, 250.01). 09/17/18   Pearlean Manus, MD  clopidogrel  (PLAVIX ) 75 MG tablet  Take 1 tablet (75 mg total) by mouth daily. 01/30/24   Rana Lum CROME, NP  empagliflozin  (JARDIANCE ) 25 MG TABS tablet TAKE 1 TABLET BY MOUTH ONCE DAILY BEFORE BREAKFAST 11/05/23   Antonio Cyndee, Yvonne R, DO  ezetimibe  (ZETIA ) 10 MG tablet Take 1 tablet by mouth once daily 12/05/23   Fountain, Madison L, NP  fluconazole  (DIFLUCAN ) 150 MG tablet Take 1 tablet (150 mg total) by mouth daily. May repeat in 3 days if needed. Patient not taking: Reported on 02/25/2024 02/05/24   Antonio Cyndee, Rockers SAUNDERS, DO  insulin  glargine (LANTUS  SOLOSTAR) 100 UNIT/ML Solostar Pen Inject 20 Units into the skin at bedtime. Patient not taking: Reported on 02/25/2024 02/05/24   Antonio Cyndee,  Jamee SAUNDERS, DO  insulin  isophane & regular human KwikPen (NOVOLIN  70/30 KWIKPEN) (70-30) 100 UNIT/ML KwikPen Inject 26 Units into the skin in the morning and at bedtime. 04/18/24   Antonio Cyndee Jamee SAUNDERS, DO  metFORMIN  (GLUCOPHAGE ) 1000 MG tablet Take 0.5 tablets (500 mg total) by mouth 2 (two) times daily with a meal. 11/20/23   Antonio Cyndee, Jamee SAUNDERS, DO  metoprolol  tartrate (LOPRESSOR ) 100 MG tablet Take 1 tablet (100 mg total) by mouth 3 (three) times daily. 12/19/23 03/18/24  Rana Lum CROME, NP  rosuvastatin  (CRESTOR ) 40 MG tablet Take 1 tablet (40 mg total) by mouth daily. Needs an appt 05/12/24   Antonio Cyndee, Jamee SAUNDERS, DO  sulfamethoxazole -trimethoprim  (BACTRIM  DS) 800-160 MG tablet Take 1 tablet by mouth 2 (two) times daily. 02/27/24   Amon Aloysius BRAVO, MD     Critical care time: 88   The patient is critically ill with multiple organ system failure and requires high complexity decision making for assessment and support, frequent evaluation and titration of therapies, advanced monitoring, review of radiographic studies and interpretation of complex data.   Critical Care Time devoted to patient care services, exclusive of separately billable procedures, described in this note is 35 minutes.   Orlin Fairly, MD [facility] See Amion for pager  If no response to pager , please call [phone] until 7pm After 7:00 pm call Elink  [phone] 05/18/2024, 3:41 AM

## 2024-05-18 NOTE — Progress Notes (Signed)
 PHARMACY - ANTICOAGULATION Pharmacy Consult:  Heparin  Indication: ACS/NSTEMI   Allergies  Allergen Reactions   Lisinopril  Hives, Itching and Swelling    Lip swelling, itching from head to toe, hives chest, breast, and abdomen.   Atorvastatin  Other (See Comments)    Myalgias   Red Blood Cells     Jehova's witness    Patient Measurements: Height: 5' 5 (165.1 cm) Weight: 93 kg (205 lb) IBW/kg (Calculated) : 57 HEPARIN  DW (KG): 77.8  Vital Signs: Temp: 100.1 F (37.8 C) (10/12 0703) Temp Source: Axillary (10/12 0703) BP: 200/168 (10/12 1700) Pulse Rate: 136 (10/12 1700)  Labs: Recent Labs    05/18/24 0156 05/18/24 0331 05/18/24 1001 05/18/24 1002 05/18/24 1218 05/18/24 1506 05/18/24 1731  HGB 13.8 13.8 13.1  --   --   --   --   HCT 40.1 40.8 38.0  --   --   --   --   PLT 161 127* 118*  --   --   --   --   LABPROT 15.8*  --   --   --   --   --   --   INR 1.2  --   --   --   --   --   --   HEPARINUNFRC  --   --   --   --   --   --  0.38  CREATININE 2.96*  --  3.37*  --  3.58* 3.78*  --   TROPONINIHS 1,322* 1,521*  --  1,781*  --  2,036*  --     Estimated Creatinine Clearance: 17.8 mL/min (A) (by C-G formula based on SCr of 3.78 mg/dL (H)).   Assessment: 78 YOF presented with SOB and flu-like symptoms.  Found to have sepsis, DKA and NSTEMI.  Pharmacy consulted to dose IV heparin   Heparin  level therapeutic (0.38) on infusion at 950 units/hr. No bleeding noted.  Goal of Therapy:  Heparin  level 0.3-0.7 units/ml Monitor platelets by anticoagulation protocol: Yes   Plan:  Continue heparin  gtt at 950 units/hr Will f/u 6h confirmatory heparin  level  Vito Ralph, PharmD, BCPS Please see amion for complete clinical pharmacist phone list 05/18/2024, 6:11 PM

## 2024-05-18 NOTE — Progress Notes (Signed)
 Asked to see patient for tachypnia.  RR 40's.  Patient's metabolic issues are improving.  Remains on BiPAP 70% FIO2, sats 93%.  SBP 144. Labs reviewed: trops downtrending, Cr increasing and decreased UO, LA 3.8 On exam lung are clear, heart reg tachy, and soft nt nd and no LE edema, AAO x 3 Cxr right sided infiltrate  A/p: DKA LA NSTEMI Acute hypoxic respiratory failure AKI Tachypnea UTI/PNA  Not sure the reason for her significant tachypnea.  Increased LA maybe from Sepsis, but will get echo. Although PE has not been ruled out, she is on therapeutic Heparin  drip and given her Cr. A CTA is not reasonable. Will give Low dose of Morphine for her breathing. The patient is critically ill with multiple organ system failure and requires high complexity decision making for assessment and support, frequent evaluation and titration of therapies, advanced monitoring, review of radiographic studies and interpretation of complex data.  Continuing Insulin  drip, antibiotics and BiPAP and Heparin  drip.  Critical Care Time devoted to patient care services, exclusive of separately billable procedures, described in this note is 30 minutes extra.   Orlin Fairly, MD [facility] See Amion for pager  If no response to pager , please call [phone] until 7pm After 7:00 pm call Elink  [phone] 05/18/2024, 9:19 PM

## 2024-05-18 NOTE — ED Notes (Addendum)
 Elevated Trop/Glucose results reported to Dr. Darra PARAS. ( EDP) . Reported to nurse by lab technician over the phone .

## 2024-05-18 NOTE — Progress Notes (Signed)
 Pt being followed by [facility] for Sepsis protocol.

## 2024-05-18 NOTE — ED Triage Notes (Signed)
 Pt reports cough, shob, generalized weakness, emesis x3 days. Pt states I thought it was just the flu but its getting worse.

## 2024-05-18 NOTE — ED Notes (Signed)
 This phlebotomist stuck the patient to obtain morning labs. Unable to to collect. RN aware

## 2024-05-18 NOTE — ED Provider Notes (Addendum)
 [place] EMERGENCY DEPARTMENT AT [facility] Provider Note   CSN: [id number] Arrival date & time: 05/18/24  0136     Patient presents with: Shortness of Breath, Emesis, and Weakness   Danielle Flores is a 58 y.o. female with PMHx HLD, HTN, CAD/AMI, DM, who presents to ED concerned for fever, rhinorrhea, congestion, SOB progressing over the past 3 days. Also with nausea and vomiting yesterday and has not been able to eat much over the past 2 days. No nausea currently. Denies chest pain, dysuria, hematuria, diarrhea, abdominal pain. Patient thought that symptoms were d/t viral URI, but they continue to worsen.      Shortness of Breath Associated symptoms: vomiting   Emesis Weakness Associated symptoms: shortness of breath and vomiting        Prior to Admission medications   Medication Sig Start Date End Date Taking? Authorizing Provider  amLODipine  (NORVASC ) 10 MG tablet Take 1 tablet (10 mg total) by mouth daily. 04/08/24   Antonio Cyndee Rockers R, DO  blood glucose meter kit and supplies Relion Prime or Dispense other brand based on patient and insurance preference. Use up to four times daily as directed. (FOR ICD-9 250.00, 250.01). 09/17/18   Pearlean Manus, MD  clopidogrel  (PLAVIX ) 75 MG tablet Take 1 tablet (75 mg total) by mouth daily. 01/30/24   Rana Lum CROME, NP  empagliflozin  (JARDIANCE ) 25 MG TABS tablet TAKE 1 TABLET BY MOUTH ONCE DAILY BEFORE BREAKFAST 11/05/23   Antonio Cyndee, Yvonne R, DO  ezetimibe  (ZETIA ) 10 MG tablet Take 1 tablet by mouth once daily 12/05/23   Fountain, Madison L, NP  fluconazole  (DIFLUCAN ) 150 MG tablet Take 1 tablet (150 mg total) by mouth daily. May repeat in 3 days if needed. Patient not taking: Reported on 02/25/2024 02/05/24   Antonio Cyndee, Rockers SAUNDERS, DO  insulin  glargine (LANTUS  SOLOSTAR) 100 UNIT/ML Solostar Pen Inject 20 Units into the skin at bedtime. Patient not taking: Reported on 02/25/2024 02/05/24   Lowne Chase, Yvonne R, DO   insulin  isophane & regular human KwikPen (NOVOLIN  70/30 KWIKPEN) (70-30) 100 UNIT/ML KwikPen Inject 26 Units into the skin in the morning and at bedtime. 04/18/24   Antonio Cyndee Rockers SAUNDERS, DO  metFORMIN  (GLUCOPHAGE ) 1000 MG tablet Take 0.5 tablets (500 mg total) by mouth 2 (two) times daily with a meal. 11/20/23   Antonio Cyndee, Rockers SAUNDERS, DO  metoprolol  tartrate (LOPRESSOR ) 100 MG tablet Take 1 tablet (100 mg total) by mouth 3 (three) times daily. 12/19/23 03/18/24  Rana Lum CROME, NP  rosuvastatin  (CRESTOR ) 40 MG tablet Take 1 tablet (40 mg total) by mouth daily. Needs an appt 05/12/24   Antonio Cyndee, Rockers R, DO  sulfamethoxazole -trimethoprim  (BACTRIM  DS) 800-160 MG tablet Take 1 tablet by mouth 2 (two) times daily. 02/27/24   Amon Aloysius BRAVO, MD    Allergies: Lisinopril  and Atorvastatin     Review of Systems  Respiratory:  Positive for shortness of breath.   Gastrointestinal:  Positive for vomiting.  Neurological:  Positive for weakness.    Updated Vital Signs BP 109/82   Pulse (!) 118   Temp (!) 102.6 F (39.2 C) (Oral)   Resp (!) 32   Ht 5' 5 (1.651 m)   Wt 93 kg   LMP  (LMP Unknown)   SpO2 96%   BMI 34.11 kg/m   Physical Exam Vitals and nursing note reviewed.  Constitutional:      General: She is not in acute distress.    Appearance:  She is diaphoretic. She is not toxic-appearing.  HENT:     Head: Normocephalic and atraumatic.     Mouth/Throat:     Mouth: Mucous membranes are moist.     Pharynx: No posterior oropharyngeal erythema.  Eyes:     General: No scleral icterus.       Right eye: No discharge.        Left eye: No discharge.     Conjunctiva/sclera: Conjunctivae normal.  Cardiovascular:     Rate and Rhythm: Normal rate and regular rhythm.     Pulses: Normal pulses.     Heart sounds: No murmur heard. Pulmonary:     Effort: Tachypnea and respiratory distress present.     Breath sounds: Normal breath sounds. No wheezing, rhonchi or rales.  Abdominal:      Tenderness: There is no abdominal tenderness.  Skin:    General: Skin is warm.     Findings: No rash.  Neurological:     General: No focal deficit present.     Mental Status: She is alert and oriented to person, place, and time. Mental status is at baseline.  Psychiatric:        Mood and Affect: Mood normal.        Behavior: Behavior normal.     (all labs ordered are listed, but only abnormal results are displayed) Labs Reviewed  CBC WITH DIFFERENTIAL/PLATELET - Abnormal; Notable for the following components:      Result Value   WBC 14.3 (*)    RBC 5.77 (*)    MCV 69.5 (*)    MCH 23.9 (*)    Neutro Abs 10.9 (*)    All other components within normal limits  COMPREHENSIVE METABOLIC PANEL WITH GFR - Abnormal; Notable for the following components:   Sodium 126 (*)    Potassium 2.9 (*)    Chloride 90 (*)    CO2 12 (*)    Glucose, Bld 524 (*)    Creatinine, Ser 2.96 (*)    Calcium  8.4 (*)    Albumin 2.6 (*)    AST 233 (*)    ALT 62 (*)    GFR, Estimated 18 (*)    Anion gap 24 (*)    All other components within normal limits  PROTIME-INR - Abnormal; Notable for the following components:   Prothrombin Time 15.8 (*)    All other components within normal limits  BETA-HYDROXYBUTYRIC ACID - Abnormal; Notable for the following components:   Beta-Hydroxybutyric Acid 1.30 (*)    All other components within normal limits  CBC WITH DIFFERENTIAL/PLATELET - Abnormal; Notable for the following components:   WBC 13.4 (*)    RBC 5.82 (*)    MCV 70.1 (*)    MCH 23.7 (*)    Platelets 127 (*)    Neutro Abs 10.5 (*)    All other components within normal limits  CBG MONITORING, ED - Abnormal; Notable for the following components:   Glucose-Capillary 538 (*)    All other components within normal limits  TROPONIN I (HIGH SENSITIVITY) - Abnormal; Notable for the following components:   Troponin I (High Sensitivity) 1,322 (*)    All other components within normal limits  RESP PANEL BY  RT-PCR (RSV, FLU A&B, COVID)  RVPGX2  CULTURE, BLOOD (ROUTINE X 2)  CULTURE, BLOOD (ROUTINE X 2)  BRAIN NATRIURETIC PEPTIDE  URINALYSIS, W/ REFLEX TO CULTURE (INFECTION SUSPECTED)  BASIC METABOLIC PANEL WITH GFR  BASIC METABOLIC PANEL WITH GFR  BASIC METABOLIC PANEL  WITH GFR  BASIC METABOLIC PANEL WITH GFR  BETA-HYDROXYBUTYRIC ACID  BETA-HYDROXYBUTYRIC ACID  BETA-HYDROXYBUTYRIC ACID  BETA-HYDROXYBUTYRIC ACID  I-STAT CG4 LACTIC ACID, ED  I-STAT VENOUS BLOOD GAS, ED  TROPONIN I (HIGH SENSITIVITY)    EKG: EKG Interpretation Date/Time:  Sunday May 18 2024 01:51:17 EDT Ventricular Rate:  131 PR Interval:  130 QRS Duration:  72 QT Interval:  316 QTC Calculation: 466 R Axis:   83  Text Interpretation: Sinus tachycardia Otherwise normal ECG When compared with ECG of 05-Dec-2023 08:29, PREVIOUS ECG IS PRESENT Confirmed by Darra Chew [phone]) on 05/18/2024 1:54:06 AM  Radiology: [facility] Chest 2 View Result Date: 05/18/2024 CLINICAL DATA:  Shortness of breath EXAM: CHEST - 2 VIEW COMPARISON:  09/13/2018 FINDINGS: The heart size and mediastinal contours are within normal limits. Both lungs are clear. The visualized skeletal structures are unremarkable. IMPRESSION: No active cardiopulmonary disease. Electronically Signed   By: Oneil Devonshire M.D.   On: 05/18/2024 02:10     .Critical Care  Performed by: Hoy Nidia FALCON, PA-C Authorized by: Hoy Nidia FALCON, PA-C   Critical care provider statement:    Critical care time (minutes):  60   Critical care was necessary to treat or prevent imminent or life-threatening deterioration of the following conditions: AKI, DKA, NSTEMI, sepsis.   Critical care was time spent personally by me on the following activities:  Development of treatment plan with patient or surrogate, discussions with consultants, evaluation of patient's response to treatment, examination of patient, ordering and review of laboratory studies, ordering and review of  radiographic studies, ordering and performing treatments and interventions, pulse oximetry, re-evaluation of patient's condition and review of old charts   Care discussed with: admitting provider      Medications Ordered in the ED  lactated ringers  infusion ( Intravenous New Bag/Given 05/18/24 0409)  lactated ringers  bolus 1,000 mL (1,000 mLs Intravenous New Bag/Given 05/18/24 0412)    And  lactated ringers  bolus 500 mL (500 mLs Intravenous New Bag/Given 05/18/24 0412)    And  lactated ringers  bolus 250 mL (has no administration in time range)  metroNIDAZOLE (FLAGYL) IVPB 500 mg (500 mg Intravenous New Bag/Given 05/18/24 0351)  insulin  regular, human (MYXREDLIN ) 100 units/ 100 mL infusion (8 Units/hr Intravenous New Bag/Given 05/18/24 0407)  lactated ringers  infusion (has no administration in time range)  dextrose  5 % in lactated ringers  infusion (0 mLs Intravenous Hold 05/18/24 0410)  dextrose  50 % solution 0-50 mL (has no administration in time range)  potassium chloride  10 mEq in 100 mL IVPB (10 mEq Intravenous New Bag/Given 05/18/24 0408)  ondansetron  (ZOFRAN ) injection 4 mg (has no administration in time range)  vancomycin (VANCOREADY) IVPB 2000 mg/400 mL (2,000 mg Intravenous New Bag/Given 05/18/24 0354)  acetaminophen (TYLENOL) tablet 650 mg (650 mg Oral Given 05/18/24 0148)  ceFEPIme (MAXIPIME) 2 g in sodium chloride  0.9 % 100 mL IVPB (0 g Intravenous Stopped 05/18/24 0423)  lactated ringers  bolus 1,860 mL (1,860 mLs Intravenous New Bag/Given 05/18/24 0341)  aspirin  chewable tablet 324 mg (324 mg Oral Given 05/18/24 0416)                                    Medical Decision Making Amount and/or Complexity of Data Reviewed Labs: ordered. Radiology: ordered.  Risk OTC drugs. Prescription drug management. Decision regarding hospitalization.   This patient presents to the ED for concern of shortness of breath, this involves an  extensive number of treatment options, and is a  complaint that carries with it a high risk of complications and morbidity.  The differential diagnosis includes Anxiety, Anaphylaxis/Angioedema, Aspirated FB, Arrhythmia, CHF, Asthma, COPD, PNA, COVID/Flu/RSV, STEMI, Tamponade, TPNX, Sepsis   Co morbidities that complicate the patient evaluation  HLD, HTN, CAD/AMI, DM   Additional history obtained:  2020 ECHO: 60-65% EF 2020 Left heart cath: Prox RCA to Mid RCA lesion is 25% stenosed. Prox Cx to Mid Cx lesion is 25% stenosed. Ost LAD lesion is 25% stenosed. RPDA lesion is 95% stenosed. A drug-eluting stent was successfully placed using a STENT SYNERGY DES 2.5X16. Post intervention, there is a 0% residual stenosis. The left ventricular ejection fraction is greater than 65% by visual estimate. There is hyperdynamic left ventricular systolic function. LV end diastolic pressure is normal. LVEDP 7 mm Hg. There is no aortic valve stenosis.   Problem List / ED Course / Critical interventions / Medication management  Patient presents to ED concerned for fever, SOB, rhinorrhea, congestion, nausea vomiting progressively getting worse over the past 3 days. Patient is febrile at 102.22F which is after receiving initial dose of Tylenol in triage. Patient with tachycardia at 118. Patient also with tachypnea. Initial O2 89% on RA. Code sepsis activated.  I Ordered, and personally interpreted labs.  Initial troponin elevated at 1322.  BNP within normal limits.  CBC with leukocytosis of 14.3.  No anemia.  CMP with hyponatremia, hypokalemia, low chloride, and low CO2 at 126/2.9/90/12.  There is also AKI with creatinine at 2.96.  Liver enzymes are also elevated with AST/ALT at 233/62.  There is also an anion gap at 24.  BNP within normal limits.  Respiratory panel negative.  Beta hydroxybutyrate Cacit elevated at 1.30.  Rest of workup pending. The patient was maintained on a cardiac monitor.  I personally viewed and interpreted the cardiac monitored which showed an  underlying rhythm of: Sinus tachycardia I ordered imaging studies including chest xray to assess for process contributing to patient's symptoms. I independently visualized and interpreted imaging which showed no acute cardiopulmonary disease. I agree with the radiologist interpretation. Unfortunately, labs that have resulted so far is concerning for AKI, DKA, NSTEMI, and sepsis. IV fluids, ABX, insulin , and potassium ordered. Heparin  also ordered.  I requested consultation with the Critical Care provider on-call Dr. Maree shortly after patient arrived to ED room since many of her labs had already resulted,  and discussed lab and imaging findings as well as pertinent plan - they agree to evaluate patient in ED for probable admission to ICU.  After evaluation, Dr. Maree admitting patient to ICU. Consulted with cardiologist on-call Dr. Cesario who agrees to evaluate patient in ED and recommends starting patient on heparin  for possible ACS vs PE. Staffed with Dr. Nettie I have reviewed the patients home medicines and have made adjustments as needed   Social Determinants of Health:  none       Final diagnoses:  Sepsis with acute renal failure, due to unspecified organism, unspecified acute renal failure type, unspecified whether septic shock present [facility])  NSTEMI (non-ST elevated myocardial infarction) (HCC)  Diabetic ketoacidosis without coma associated with type 2 diabetes mellitus (HCC)  AKI (acute kidney injury)    ED Discharge Orders     None          Hoy Nidia JULIANNA DEVONNA 05/18/24 0428    Palumbo, April, MD 05/18/24 0438    Hoy Nidia JULIANNA, PA-C 05/18/24 0439    Palumbo,  April, MD 05/18/24 [phone]

## 2024-05-18 NOTE — Consult Note (Signed)
 Brief cardiology consult note  58 yo F with prior RCA stent in 2020, preserved EF presents with fever and SOB, likely infectious in etiology, with presentation complicated by DKA, electrolyte and LFT abnormalities. Tachycardic, stable BP in the 110s and initial troponin 1300 with ECG showing sinus tachycardia without overt ischemic changes. No ischemic symptoms reported. Exam without JVD, warm extremities, tachycardic, no murmurs.  On clopidogrel  Last echo with preserved EF and moderate LVH  Impression: suspect type II MI in the setting of infection and tachycardia.  No urgent indication for revascularization.  In the absence of bleeding contraindication, recommend heparinization.  Supportive management of infection and DKA.  Can trend troponin and is continues to uptrend, consider echo to evaluate extent of myocardial injury.  ICU team likely to recommend holding antihypertensives to ensure hemodynamic stability, which is ok.   Andee Flatten, MD Cardiology on call

## 2024-05-18 NOTE — Progress Notes (Addendum)
 NAME:  Danielle Flores, MRN:  [id number], DOB:  Nov 25, 1965, LOS: 0 ADMISSION DATE:  05/18/2024, CONSULTATION DATE:  05/18/2024 REFERRING MD:  Danielle Palumbo, MD, CHIEF COMPLAINT:  SOB  History of Present Illness:  58 y/o female with PMH for DMT2 on insulin , HTN, HLD, CAD s/p MI 09/2018, S/p 1 coronary stent who presents with worsening flu like symptoms.  She feels she has a the flu.  She has not had a swab or definitive testing but felt like she had the flu.  She c/o N/V x 3 days and no diarrhea, she had generalized weakness.  She has been taking her insulin .  She says she has been getting worse rather than better. In the ED she was found to have NSTEMI with increased Toponin 1,322, BG on chenistry 524, K+ 2.9, WBC 14.3, NA 126, Bicarb 12, AG 24.  Covid, flu and RSSV negative.  Pertinent  Medical History  DMT2 on insulin , HTN, HLD, CAD s/p MI 09/2018, S/p 1 coronary stent  **Jehova's witness**no blood transfusions**  Significant Hospital Events: Including procedures, antibiotic start and stop dates in addition to other pertinent events   10/12: NSTEMI and DKA, requiring application of BiPAP in ED  Interim History / Subjective:  Seen sitting up in bed on BiPAP alert and oriented with tachypnea  Objective    Blood pressure 133/64, pulse (!) 116, temperature 100.1 F (37.8 C), temperature source Axillary, resp. rate (!) 39, height 5' 5 (1.651 m), weight 93 kg, SpO2 99%.    Vent Mode: PCV;CPAP FiO2 (%):  [50 %] 50 % Set Rate:  [14 bmp] 14 bmp Pressure Support:  [5 cmH20] 5 cmH20  No intake or output data in the 24 hours ending 05/18/24 0755 Filed Weights   05/18/24 0305  Weight: 93 kg    Examination: General: Acute ill-appearing 58 y/o female lying in bed on BiPAP in no acute distress HEENT: [place]/AT BiPAP mask in place, MM pink/moist, PERRL,  Neuro: Alert and oriented x 3, CV: s1s2 regular rate and rhythm, no murmur, rubs, or gallops,  PULM: Diminished air entry bilaterally, no  added breath sounds, tolerating BiPAP GI: soft, bowel sounds active in all 4 quadrants, non-tender, non-distended Extremities: warm/dry, no edema  Skin: no rashes or lesions   Resolved problem list   Assessment and Plan  Diabetic ketoacidosis with history of type 2 diabetes - Patient presented with hyperglycemia, anion gap 24, CO2 12, but alert and oriented meeting criteria for mild to moderate DKA -Most recent hemoglobin A1c March 2025 7.6 P: Aggressive IV hydration provided on admission Continue insulin  drip  Closely monitor electrolyte Every hour CBG checks Once blood glucose falls below 250 start patient on D5 IV fluids When I anion gap closes and patient is able to tolerate oral diet transition to subcu long-acting insulin  in slowly turned drip off over the next 1-2 hours Monitor renal function  Sepsis -Concern for sepsis POA.  Patient presented febrile with Tmax 102.6, tachycardic, and tachypneic with elevated lactic acid.  However this is in the setting of DKA currently no identified signs of infection with negative chest x-ray but it appears patient has had her send UTI.  Continue ongoing medical workup high suspicion for potential viral pneumonia P: Admit ICU Continue supplemental oxygen as needed continue Continue cefepime and vancomycin Trend lactic acid  Received aggressive dehydration in ED Check RVP panel Consider abdominal imaging  NSTEMI -Troponin on admission 1322 with uptrend to 1521 denies any current chest pain.  No signs  of ST elevation or depression on EKG P: Continue heparin  drip Consider ischemic workup once stabilized either inpatient versus outpatient Continuous telemetry Optimize electrolytes as above Trend troponin  Check echocardiogram  Acute Kidney Injury  - Creatinine on admission 2.96 with GFR 18 compared to creatinine 1.04 with GFR 58 which 20 P: Follow renal function  Monitor urine output Trend Bmet Avoid nephrotoxins Ensure adequate  renal perfusion  IV hydration  Hypokalemia P: Supplement  Protein calorie malnutrition P: Optimize oral intake and protein once able  Elevated AST and ALT -Likely reactive  P: Intermittently trend LFTs Avoid hepatotoxins  Critical care time: 35  CRITICAL CARE Performed by: Danielle Flores   Total critical care time: 40 minutes  Critical care time was exclusive of separately billable procedures and treating other patients.  Critical care was necessary to treat or prevent imminent or life-threatening deterioration.  Critical care was time spent personally by me on the following activities: development of treatment plan with patient and/or surrogate as well as nursing, discussions with consultants, evaluation of patient's response to treatment, examination of patient, obtaining history from patient or surrogate, ordering and performing treatments and interventions, ordering and review of laboratory studies, ordering and review of radiographic studies, pulse oximetry and re-evaluation of patient's condition.  Danielle D. Harris, NP-C [facility] Personal contact information can be found on Amion  If no contact or response made please call [phone] 05/18/2024, 8:16 AM      Attending attestation:  Danielle Flores is a 58 y/o woman with a history of IDDM, tyle 2, CAD with previous MI in 2020, HTN, HLD who presented with weakness, falls, nausea, vomiting. She was taking her insulin  as scheduled with normal BG last week. She is unsure why she went into DKA. She feels more comfortably on bipap and enjoys wearing it.  BP (!) 142/75 (BP Location: Right Arm)   Pulse (!) 129   Temp 100.1 F (37.8 C) (Axillary)   Resp (!) 26   Ht 5' 5 (1.651 m)   Wt 93 kg   LMP  (LMP Unknown)   SpO2 92%   BMI 34.11 kg/m  Elderly woman lying in bed in NAD [place]/AT, eyes anicteric Breathing comfortably on BiPAP, no wheezing or rhales.  Abd soft, NT No cyanosis or edema  Na+   129 K+ 3.2 Bicarb 17, AG 16 BUN 23 Cr 3.58 Trop 1718 WBC 10.4 H/H 13.1/38 Platelets 188 CXR personally reviewed> possible early medial RLL infiltrate EKG: sinus tach,  1mm ST elevation V2, inferior ST depressions, TWI in III.   LA 3.1>4.7  Assessment & plan:  Likely sepsis vs viral illness; RVP & covid negative. Source likely RLL pneumonia Worsening lactic acidosis -additional 1L LR, recheck LA -blood cultures pending -check UA -con't empiric broad spectrum antibiotics -- unasyn, doxycycline  Acute respiratory failure with hypoxia, likely RLL aspiration pneumonia -doxy, unasyn -bipap PRN for comfort, otherwise House  Severe DKA -insulin  gtt -q4h BMPs; replete electrolytes as needed -IVF  Pseudohyponatremia due to DKA -treat DKA  Hypokalemia, hypomagnesemia, hypophosphatemia -replete -monitor  NSTEMI, h/o CAD -aspirin , trial of rosuvastatin  (intolerant to atorvastatin ), PTA plavix  -heparin  gtt -echo pending -Cardiology consulted in the ED. Not a good LHC candidate until her renal failure improves. -serial EKG & trop -tele monitoring  AKI, worsening -volume resuscitation -renally dose meds, avoid nephrotoxic meds -strict I/O -place feoley -renal US   Prolonged Qtc -avoid QT prolonging meds -correct electrolytes   Thrombocytopenia -monitor, no current need for  transfusion  Jehova's witness-- forms that she gave to RN added below      This patient is critically ill with multiple organ system failure which requires frequent high complexity decision making, assessment, support, evaluation, and titration of therapies. This was completed through the application of advanced monitoring technologies and extensive interpretation of multiple databases. During this encounter critical care time was devoted to patient care services described in this note for 46 minutes.  Leita SHAUNNA Gaskins, DO 05/18/24 2:53 PM [facility]  For contact information,  see Amion. If no response to pager, please call PCCM consult pager. After hours, 7PM- 7AM, please call Elink.

## 2024-05-18 NOTE — Plan of Care (Signed)

## 2024-05-18 NOTE — Progress Notes (Signed)
 PHARMACY - ANTICOAGULATION & ANTIBIOTIC CONSULT NOTE  Pharmacy Consult:  Heparin  + Vancomycin/Cefepime Indication: ACS/NSTEMI + Sepsis  Allergies  Allergen Reactions   Lisinopril  Hives, Itching and Swelling    Lip swelling, itching from head to toe, hives chest, breast, and abdomen.   Atorvastatin  Other (See Comments)    Myalgias    Patient Measurements: Height: 5' 5 (165.1 cm) Weight: 93 kg (205 lb) IBW/kg (Calculated) : 57 HEPARIN  DW (KG): 77.8  Vital Signs: Temp: 100.1 F (37.8 C) (10/12 0703) Temp Source: Axillary (10/12 0703) BP: 117/64 (10/12 0715) Pulse Rate: 111 (10/12 0630)  Labs: Recent Labs    05/18/24 0156 05/18/24 0331  HGB 13.8 13.8  HCT 40.1 40.8  PLT 161 127*  LABPROT 15.8*  --   INR 1.2  --   CREATININE 2.96*  --   TROPONINIHS 1,322* 1,521*    Estimated Creatinine Clearance: 22.8 mL/min (A) (by C-G formula based on SCr of 2.96 mg/dL (H)).   Medical History: Past Medical History:  Diagnosis Date   Hyperglycemia    Hyperlipidemia    Hypertension     Assessment: 64 YOF presented with SOB and flu-like symptoms.  Found to have sepsis, DKA and NSTEMI.  Pharmacy consulted to dose IV heparin , vancomycin and cefepime.  First doses of antibiotics are already given.  Patient with significant AKI - SCr 2.96 (BL SCr 0.8-1), Tmax 102.6, WBC down to 13.4.  H/H WNL and platelet count has already trended down.  No bleeding reported.  Not on a blood thinner PTA per completed medication history.  Goal of Therapy:  Heparin  level 0.3-0.7 units/ml Monitor platelets by anticoagulation protocol: Yes Vanc AUC 400-550   Plan:  Heparin  3500 units IV bolus, then Heparin  gtt at 950 units/hr Check 8 hr heparin  level Daily heparin  level and CBC  Vanc 2gm IV x 1, then 750 IV Q48H for AUC 414 using SCr 2.96  -dose vanc per level should renal function worsens Cefepime 2gm IV Q24H Monitor renal function, clinical progress, vanc levels as needed  Veneda Kirksey D. Lendell,  PharmD, BCPS, BCCCP 05/18/2024, 7:51 AM

## 2024-05-19 ENCOUNTER — Inpatient Hospital Stay (HOSPITAL_COMMUNITY)

## 2024-05-19 ENCOUNTER — Other Ambulatory Visit: Payer: Self-pay | Admitting: Family Medicine

## 2024-05-19 ENCOUNTER — Encounter (HOSPITAL_COMMUNITY): Payer: Self-pay

## 2024-05-19 DIAGNOSIS — N170 Acute kidney failure with tubular necrosis: Secondary | ICD-10-CM | POA: Diagnosis not present

## 2024-05-19 DIAGNOSIS — E871 Hypo-osmolality and hyponatremia: Secondary | ICD-10-CM

## 2024-05-19 DIAGNOSIS — I2489 Other forms of acute ischemic heart disease: Secondary | ICD-10-CM

## 2024-05-19 DIAGNOSIS — N179 Acute kidney failure, unspecified: Secondary | ICD-10-CM

## 2024-05-19 DIAGNOSIS — R6521 Severe sepsis with septic shock: Secondary | ICD-10-CM | POA: Diagnosis not present

## 2024-05-19 DIAGNOSIS — A419 Sepsis, unspecified organism: Secondary | ICD-10-CM | POA: Diagnosis not present

## 2024-05-19 DIAGNOSIS — R0609 Other forms of dyspnea: Secondary | ICD-10-CM | POA: Diagnosis not present

## 2024-05-19 DIAGNOSIS — R7989 Other specified abnormal findings of blood chemistry: Secondary | ICD-10-CM

## 2024-05-19 DIAGNOSIS — E876 Hypokalemia: Secondary | ICD-10-CM

## 2024-05-19 DIAGNOSIS — K7200 Acute and subacute hepatic failure without coma: Secondary | ICD-10-CM

## 2024-05-19 DIAGNOSIS — J9601 Acute respiratory failure with hypoxia: Secondary | ICD-10-CM | POA: Diagnosis not present

## 2024-05-19 DIAGNOSIS — E1110 Type 2 diabetes mellitus with ketoacidosis without coma: Secondary | ICD-10-CM | POA: Diagnosis not present

## 2024-05-19 DIAGNOSIS — D696 Thrombocytopenia, unspecified: Secondary | ICD-10-CM

## 2024-05-19 LAB — BASIC METABOLIC PANEL WITH GFR
Anion gap: 17 — ABNORMAL HIGH (ref 5–15)
Anion gap: 18 — ABNORMAL HIGH (ref 5–15)
Anion gap: 15 (ref 5–15)
Anion gap: 12 (ref 5–15)
Anion gap: 15 (ref 5–15)
BUN: 28 mg/dL — ABNORMAL HIGH (ref 6–20)
BUN: 29 mg/dL — ABNORMAL HIGH (ref 6–20)
BUN: 32 mg/dL — ABNORMAL HIGH (ref 6–20)
BUN: 34 mg/dL — ABNORMAL HIGH (ref 6–20)
BUN: 40 mg/dL — ABNORMAL HIGH (ref 6–20)
CO2: 12 mmol/L — ABNORMAL LOW (ref 22–32)
CO2: 11 mmol/L — ABNORMAL LOW (ref 22–32)
CO2: 14 mmol/L — ABNORMAL LOW (ref 22–32)
CO2: 14 mmol/L — ABNORMAL LOW (ref 22–32)
CO2: 17 mmol/L — ABNORMAL LOW (ref 22–32)
Calcium: 7.7 mg/dL — ABNORMAL LOW (ref 8.9–10.3)
Calcium: 7.4 mg/dL — ABNORMAL LOW (ref 8.9–10.3)
Calcium: 6.9 mg/dL — ABNORMAL LOW (ref 8.9–10.3)
Calcium: 7 mg/dL — ABNORMAL LOW (ref 8.9–10.3)
Calcium: 7 mg/dL — ABNORMAL LOW (ref 8.9–10.3)
Chloride: 100 mmol/L (ref 98–111)
Chloride: 99 mmol/L (ref 98–111)
Chloride: 102 mmol/L (ref 98–111)
Chloride: 101 mmol/L (ref 98–111)
Chloride: 99 mmol/L (ref 98–111)
Creatinine, Ser: 4.96 mg/dL — ABNORMAL HIGH (ref 0.44–1.00)
Creatinine, Ser: 5.1 mg/dL — ABNORMAL HIGH (ref 0.44–1.00)
Creatinine, Ser: 5.63 mg/dL — ABNORMAL HIGH (ref 0.44–1.00)
Creatinine, Ser: 6.18 mg/dL — ABNORMAL HIGH (ref 0.44–1.00)
Creatinine, Ser: 6.52 mg/dL — ABNORMAL HIGH (ref 0.44–1.00)
GFR, Estimated: 9 mL/min — ABNORMAL LOW (ref >60)
GFR, Estimated: 9 mL/min — ABNORMAL LOW (ref >60)
GFR, Estimated: 8 mL/min — ABNORMAL LOW (ref >60)
GFR, Estimated: 7 mL/min — ABNORMAL LOW (ref >60)
GFR, Estimated: 7 mL/min — ABNORMAL LOW (ref >60)
Glucose, Bld: 172 mg/dL — ABNORMAL HIGH (ref 70–99)
Glucose, Bld: 159 mg/dL — ABNORMAL HIGH (ref 70–99)
Glucose, Bld: 158 mg/dL — ABNORMAL HIGH (ref 70–99)
Glucose, Bld: 187 mg/dL — ABNORMAL HIGH (ref 70–99)
Glucose, Bld: 193 mg/dL — ABNORMAL HIGH (ref 70–99)
Potassium: 4.6 mmol/L (ref 3.5–5.1)
Potassium: 4.7 mmol/L (ref 3.5–5.1)
Potassium: 4.5 mmol/L (ref 3.5–5.1)
Potassium: 4.3 mmol/L (ref 3.5–5.1)
Potassium: 4.3 mmol/L (ref 3.5–5.1)
Sodium: 129 mmol/L — ABNORMAL LOW (ref 135–145)
Sodium: 128 mmol/L — ABNORMAL LOW (ref 135–145)
Sodium: 131 mmol/L — ABNORMAL LOW (ref 135–145)
Sodium: 127 mmol/L — ABNORMAL LOW (ref 135–145)
Sodium: 131 mmol/L — ABNORMAL LOW (ref 135–145)

## 2024-05-19 LAB — GLUCOSE, CAPILLARY
Glucose-Capillary: 177 mg/dL — ABNORMAL HIGH (ref 70–99)
Glucose-Capillary: 180 mg/dL — ABNORMAL HIGH (ref 70–99)
Glucose-Capillary: 177 mg/dL — ABNORMAL HIGH (ref 70–99)
Glucose-Capillary: 180 mg/dL — ABNORMAL HIGH (ref 70–99)
Glucose-Capillary: 186 mg/dL — ABNORMAL HIGH (ref 70–99)
Glucose-Capillary: 184 mg/dL — ABNORMAL HIGH (ref 70–99)
Glucose-Capillary: 142 mg/dL — ABNORMAL HIGH (ref 70–99)
Glucose-Capillary: 164 mg/dL — ABNORMAL HIGH (ref 70–99)
Glucose-Capillary: 151 mg/dL — ABNORMAL HIGH (ref 70–99)
Glucose-Capillary: 192 mg/dL — ABNORMAL HIGH (ref 70–99)
Glucose-Capillary: 175 mg/dL — ABNORMAL HIGH (ref 70–99)
Glucose-Capillary: 161 mg/dL — ABNORMAL HIGH (ref 70–99)
Glucose-Capillary: 195 mg/dL — ABNORMAL HIGH (ref 70–99)
Glucose-Capillary: 213 mg/dL — ABNORMAL HIGH (ref 70–99)
Glucose-Capillary: 191 mg/dL — ABNORMAL HIGH (ref 70–99)
Glucose-Capillary: 164 mg/dL — ABNORMAL HIGH (ref 70–99)
Glucose-Capillary: 159 mg/dL — ABNORMAL HIGH (ref 70–99)

## 2024-05-19 LAB — CBC
HCT: 41.9 % (ref 36.0–46.0)
Hemoglobin: 14.3 g/dL (ref 12.0–15.0)
MCH: 23.4 pg — ABNORMAL LOW (ref 26.0–34.0)
MCHC: 34.1 g/dL (ref 30.0–36.0)
MCV: 68.5 fL — ABNORMAL LOW (ref 80.0–100.0)
Platelets: 135 K/uL — ABNORMAL LOW (ref 150–400)
RBC: 6.12 MIL/uL — ABNORMAL HIGH (ref 3.87–5.11)
RDW: 15.9 % — ABNORMAL HIGH (ref 11.5–15.5)
WBC: 12.5 K/uL — ABNORMAL HIGH (ref 4.0–10.5)
nRBC: 0 % (ref 0.0–0.2)

## 2024-05-19 LAB — HEPARIN LEVEL (UNFRACTIONATED): Heparin Unfractionated: 0.36 [IU]/mL (ref 0.30–0.70)

## 2024-05-19 LAB — BETA-HYDROXYBUTYRIC ACID
Beta-Hydroxybutyric Acid: 0.16 mmol/L (ref 0.05–0.27)
Beta-Hydroxybutyric Acid: 0.14 mmol/L (ref 0.05–0.27)
Beta-Hydroxybutyric Acid: 0.17 mmol/L (ref 0.05–0.27)
Beta-Hydroxybutyric Acid: 0.15 mmol/L (ref 0.05–0.27)
Beta-Hydroxybutyric Acid: 0.22 mmol/L (ref 0.05–0.27)

## 2024-05-19 LAB — HEMOGLOBIN A1C
Hgb A1c MFr Bld: 9.9 % — ABNORMAL HIGH (ref 4.8–5.6)
Mean Plasma Glucose: 237 mg/dL

## 2024-05-19 LAB — C DIFFICILE QUICK SCREEN W PCR REFLEX
C Diff antigen: NEGATIVE
C Diff interpretation: NOT DETECTED
C Diff toxin: NEGATIVE

## 2024-05-19 LAB — ECHOCARDIOGRAM COMPLETE
AV Mean grad: 22 mmHg
AV Peak grad: 42.5 mmHg
Ao pk vel: 3.26 m/s
Area-P 1/2: 5.27 cm2
Est EF: >75
Height: 65 in
S' Lateral: 2.2 cm
Weight: 3774.28 [oz_av]

## 2024-05-19 LAB — LACTIC ACID, PLASMA: Lactic Acid, Venous: 2.8 mmol/L (ref 0.5–1.9)

## 2024-05-19 MED ORDER — SODIUM CHLORIDE 0.9 % IV SOLN
100.0000 mg | Freq: Two times a day (BID) | INTRAVENOUS | Status: DC
  Administered 2024-05-19 – 2024-05-20 (×2): 100 mg via INTRAVENOUS
  Filled 2024-05-19 (×3): qty 100

## 2024-05-19 MED ORDER — SODIUM BICARBONATE 8.4 % IV SOLN
100.0000 meq | Freq: Once | INTRAVENOUS | Status: AC
  Administered 2024-05-19: 100 meq via INTRAVENOUS
  Filled 2024-05-19: qty 50

## 2024-05-19 MED ORDER — SODIUM CHLORIDE 0.9 % IV SOLN
250.0000 mL | INTRAVENOUS | Status: DC
  Administered 2024-05-19: 250 mL via INTRAVENOUS

## 2024-05-19 MED ORDER — DEXTROSE IN LACTATED RINGERS 5 % IV SOLN: INTRAVENOUS | Status: DC

## 2024-05-19 MED ORDER — NOREPINEPHRINE 4 MG/250ML-% IV SOLN
0.0000 ug/min | INTRAVENOUS | Status: DC
  Administered 2024-05-19: 2 ug/min via INTRAVENOUS
  Filled 2024-05-19: qty 250

## 2024-05-19 MED ORDER — FUROSEMIDE 10 MG/ML IJ SOLN
160.0000 mg | Freq: Once | INTRAMUSCULAR | Status: AC
  Administered 2024-05-19: 160 mg via INTRAVENOUS
  Filled 2024-05-19: qty 10

## 2024-05-19 MED ORDER — PIPERACILLIN-TAZOBACTAM 3.375 G IVPB
3.3750 g | Freq: Three times a day (TID) | INTRAVENOUS | Status: DC
  Administered 2024-05-19 – 2024-05-23 (×12): 3.375 g via INTRAVENOUS
  Filled 2024-05-19 (×13): qty 50

## 2024-05-19 MED ORDER — ASPIRIN 81 MG PO TBEC
81.0000 mg | DELAYED_RELEASE_TABLET | Freq: Every day | ORAL | Status: DC
  Administered 2024-05-20: 81 mg via ORAL
  Filled 2024-05-19: qty 1

## 2024-05-19 MED ORDER — ROSUVASTATIN CALCIUM 5 MG PO TABS
10.0000 mg | ORAL_TABLET | Freq: Every evening | ORAL | Status: DC
  Administered 2024-05-19 – 2024-05-21 (×3): 10 mg via ORAL
  Filled 2024-05-19 (×3): qty 2

## 2024-05-19 MED ORDER — FUROSEMIDE 10 MG/ML IJ SOLN
80.0000 mg | Freq: Once | INTRAMUSCULAR | Status: AC
  Administered 2024-05-19: 80 mg via INTRAVENOUS
  Filled 2024-05-19: qty 8

## 2024-05-19 NOTE — Progress Notes (Signed)
 NAME:  Danielle Flores, MRN:  [id number], DOB:  [birth year], LOS: 1 ADMISSION DATE:  05/18/2024, CONSULTATION DATE:  05/18/2024 REFERRING MD:  April Palumbo, MD, CHIEF COMPLAINT:  SOB  History of Present Illness:  58 y/o female with PMH for DMT2 on insulin , HTN, HLD, CAD s/p MI 09/2018, S/p 1 coronary stent who presents with worsening flu like symptoms.  She feels she has a the flu.  She has not had a swab or definitive testing but felt like she had the flu.  She c/o N/V x 3 days and no diarrhea, she had generalized weakness.  She has been taking her insulin .  She says she has been getting worse rather than better. In the ED she was found to have NSTEMI with increased Toponin 1,322, BG on chenistry 524, K+ 2.9, WBC 14.3, NA 126, Bicarb 12, AG 24.  Covid, flu and RSSV negative.  Pertinent  Medical History  DMT2 on insulin , HTN, HLD, CAD s/p MI 09/2018, S/p 1 coronary stent  **Jehova's witness**no blood transfusions**  Significant Hospital Events: Including procedures, antibiotic start and stop dates in addition to other pertinent events   10/12: NSTEMI and DKA, requiring application of BiPAP in ED  Interim History / Subjective:  Overnight patient became to With increased work of breathing and hypoxic requiring BiPAP Spiked fever with Tmax 103 Now stated breathing is better  Objective    Blood pressure 137/74, pulse (!) 117, temperature 98.6 F (37 C), temperature source Oral, resp. rate (!) 31, height 5' 5 (1.651 m), weight 107 kg, SpO2 93%.    FiO2 (%):  [40 %-70 %] 70 % PEEP:  [5 cmH20] 5 cmH20   Intake/Output Summary (Last 24 hours) at 05/19/2024 1017 Last data filed at 05/19/2024 0611 Gross per 24 hour  Intake 6832.03 ml  Output 300 ml  Net 6532.03 ml   Filed Weights   05/18/24 0305 05/19/24 0500  Weight: 93 kg 107 kg    Examination: General: Acute chronically ill-appearing obese female, lying on the bed HEENT: [place]/AT, eyes anicteric.  moist mucus membranes.  On salter  nasal cannula oxygen Neuro: Alert, awake following commands Chest: Reduced air entry at the bases bilaterally right more than left no wheezes or rhonchi Heart: Tachycardic, regular rhythm no murmurs or gallops Abdomen: Soft, nontender, nondistended, bowel sounds present  Labs and images reviewed  Patient Lines/Drains/Airways Status     Active Line/Drains/Airways     Name Placement date Placement time Site Days   Peripheral IV 05/18/24 20 G Anterior;Proximal;Right Forearm 05/18/24  0330  Forearm  1   Peripheral IV 05/18/24 22 G Anterior;Left Forearm 05/18/24  0330  Forearm  1   Peripheral IV 05/18/24 20 G Left;Posterior Hand 05/18/24  0330  Hand  1   Urethral Catheter [name] RN Double-lumen 16 Fr. 05/18/24  1636  Double-lumen  1        Resolved problem list   Assessment and Plan  Diabetes type 2 presented with DKA Patient hemoglobin A1c is 9.9 Continue insulin  infusion for now Received aggressive IV fluid therapy Anion gap is still elevated now likely due to AKI  Severe sepsis with septic shock due to right middle lobe pneumonia, POA Acute respiratory failure with hypoxia Patient still septic with acute respiratory failure X-ray chest showing worsening right middle lobe pneumonia Switch Unasyn to Zosyn Continue IV doxycycline Cultures have been negative so far Respiratory pathogen panel is negative Patient required BiPAP overnight, currently on salter nasal cannula oxygen at 8  L Continue to titrate with O2 sat goal 92% Trend Lactate  Acute NSTEMI versus demand cardiac ischemia Patient denies any chest pain Troponins are downtrending Continue IV heparin  infusion Follow-up echocardiogram if no wall motion abnormality, will stop IV heparin  infusion Continue aspirin , Plavix  and and statin Appreciate cardiology follow-up  Acute Kidney Injury due to ATN Serum creatinine continue to trend up She is oliguric Will try Lasix 80 mg x 1 Nephrology  consulted  Hypokalemia/hypomagnesemia/hypophosphatemia/hyponatremia Continue aggressive electrolyte replacement and monitor  Shock liver Trend LFTs Avoid hepatotoxic agents  Thrombocytopenia critical illness LFTs are improving, closely monitor  Jehova's witness-- forms that she gave to RN added below       The patient is critically ill due to.  Sepsis with septic shock.  Critical care was necessary to treat or prevent imminent or life-threatening deterioration.  Critical care was time spent personally by me on the following activities: development of treatment plan with patient and/or surrogate as well as nursing, discussions with consultants, evaluation of patient's response to treatment, examination of patient, obtaining history from patient or surrogate, ordering and performing treatments and interventions, ordering and review of laboratory studies, ordering and review of radiographic studies, pulse oximetry, re-evaluation of patient's condition and participation in multidisciplinary rounds.   During this encounter critical care time was devoted to patient care services described in this note for 40 minutes.     [name], MD [facility] See Amion for pager If no response to pager, please call [phone] until 7pm After 7pm, Please call E-link [phone]

## 2024-05-19 NOTE — Progress Notes (Signed)
 Patient removed from bipap and placed on 12L salter high flow nasal cannula.  Tolerating well at this time.  Will continue to monitor and assess for bipap needs.

## 2024-05-19 NOTE — Progress Notes (Signed)
  Echocardiogram 2D Echocardiogram has been performed.  Danielle Flores 05/19/2024, 4:43 PM

## 2024-05-19 NOTE — Progress Notes (Addendum)
 eLink Physician-Brief Progress Note Patient Name: Danielle Flores DOB: 09-22-1965 MRN: [id number]   Date of Service  05/19/2024  HPI/Events of Note  Pt with increased work of breathing and placed back on BIPAP.  Crea at 6.18 and was given lasix 80mg  IV earlier in the day.  Renal consulted for possible HD.   BP 114/64, HR 115, RR 28-30, O2 sats 100% Pt is tolerating BIPAP.   eICU Interventions  Check BMP now and in anion gap remains closed, will transition to SQ insulin , to minimize IVFs.       Intervention Category Intermediate Interventions: Respiratory distress - evaluation and management  Shanda Busman 05/19/2024, 8:38 PM  9:39 PM Anion gap at 15.    Plan> Decrease D5LR to 50cc/hr.  Adjust insulin  gtt accordingly.   3:35 AM Notified that order for D5LR will expire this morning. Anion gap is up to 16 at this time.   Plan> D5LR renewed.   Continue insulin  gtt.

## 2024-05-19 NOTE — Progress Notes (Signed)
 Rounding Note   Patient Name: Danielle Flores Date of Encounter: 05/19/2024  [facility] Cardiologist: Candyce Reek, MD   Subjective Feels ok. No chest pain.   Scheduled Meds:  aspirin   81 mg Oral Daily   Chlorhexidine Gluconate Cloth  6 each Topical Daily   clopidogrel   75 mg Oral Daily   rosuvastatin   10 mg Oral QPM   Continuous Infusions:  sodium chloride  Stopped (05/19/24 0605)   ampicillin-sulbactam (UNASYN) IV 200 mL/hr at 05/19/24 0611   dextrose  5% lactated ringers  125 mL/hr at 05/19/24 0611   doxycycline (VIBRAMYCIN) IV Stopped (05/19/24 0536)   heparin  950 Units/hr (05/19/24 9388)   insulin  7 Units/hr (05/19/24 9388)   norepinephrine (LEVOPHED) Adult infusion 2 mcg/min (05/19/24 9388)   PRN Meds: acetaminophen, dextrose , docusate sodium, morphine injection, ondansetron  (ZOFRAN ) IV, mouth rinse   Vital Signs  Vitals:   05/19/24 0430 05/19/24 0500 05/19/24 0600 05/19/24 0809  BP: 120/62 119/66 124/72   Pulse:   (!) 115 (!) 118  Resp: (!) 32 (!) 33 (!) 30 (!) 25  Temp:      TempSrc:      SpO2:   95% 95%  Weight:  107 kg    Height:        Intake/Output Summary (Last 24 hours) at 05/19/2024 0848 Last data filed at 05/19/2024 9388 Gross per 24 hour  Intake 7837.59 ml  Output 300 ml  Net 7537.59 ml      05/19/2024    5:00 AM 05/18/2024    3:05 AM 02/25/2024   11:04 AM  Last 3 Weights  Weight (lbs) 235 lb 14.3 oz 205 lb 202 lb 8 oz  Weight (kg) 107 kg 92.987 kg 91.853 kg      Telemetry Sinus tachy - Personally Reviewed  ECG  Sinus tach. Otherwise normal.  - Personally Reviewed  Physical Exam  GEN: No acute distress.   Neck: No JVD Cardiac: RRR, gr 2/6 systolic murmur LUSB, no rubs, or gallops.  Respiratory: Clear to auscultation bilaterally. GI: Soft, nontender, non-distended  MS: No edema; No deformity. Neuro:  Nonfocal  Psych: Normal affect   Labs High Sensitivity Troponin:   Recent Labs  Lab 05/18/24 0156  05/18/24 0331 05/18/24 1002 05/18/24 1506 05/18/24 1731  TROPONINIHS 1,322* 1,521* 1,781* 2,036* 1,254*     Chemistry Recent Labs  Lab 05/18/24 0156 05/18/24 1001 05/18/24 1218 05/18/24 1903 05/18/24 2049 05/18/24 2150 05/19/24 0318 05/19/24 0653  NA 126* 132*   < > 125* 127*  --  129* 128*  K 2.9* 3.0*   < > 4.8 4.6  --  4.6 4.7  CL 90* 91*   < > 96*  --   --  100 99  CO2 12* 17*   < > 13*  --   --  12* 11*  GLUCOSE 524* 236*   < > 338*  --   --  172* 159*  BUN 18 22*   < > 25*  --   --  28* 29*  CREATININE 2.96* 3.37*   < > 4.06*  --   --  4.96* 5.10*  CALCIUM  8.4* 7.8*   < > 7.2*  --   --  7.7* 7.4*  MG  --  1.6*  --   --   --  2.4  --   --   PROT 7.3  --   --   --   --   --   --   --   ALBUMIN  2.6*  --   --   --   --   --   --   --   AST 233*  --   --   --   --   --   --   --   ALT 62*  --   --   --   --   --   --   --   ALKPHOS 84  --   --   --   --   --   --   --   BILITOT 0.9  --   --   --   --   --   --   --   GFRNONAA 18* 15*   < > 12*  --   --  9* 9*  ANIONGAP 24* 24*   < > 16*  --   --  17* 18*   < > = values in this interval not displayed.    Lipids No results for input(s): CHOL, TRIG, HDL, LABVLDL, LDLCALC, CHOLHDL in the last 168 hours.  Hematology Recent Labs  Lab 05/18/24 0331 05/18/24 1001 05/18/24 2049 05/19/24 0318  WBC 13.4* 10.4  --  12.5*  RBC 5.82* 5.48*  --  6.12*  HGB 13.8 13.1 12.9 14.3  HCT 40.8 38.0 38.0 41.9  MCV 70.1* 69.3*  --  68.5*  MCH 23.7* 23.9*  --  23.4*  MCHC 33.8 34.5  --  34.1  RDW 15.4 14.9  --  15.9*  PLT 127* 118*  --  135*   Thyroid  No results for input(s): TSH, FREET4 in the last 168 hours.  BNP Recent Labs  Lab 05/18/24 0154  BNP 95.1    DDimer No results for input(s): DDIMER in the last 168 hours.   Radiology  CT CHEST ABDOMEN PELVIS WO CONTRAST Result Date: 05/19/2024 CLINICAL DATA:  Sepsis EXAM: CT CHEST, ABDOMEN AND PELVIS WITHOUT CONTRAST TECHNIQUE: Multidetector CT imaging of the  chest, abdomen and pelvis was performed following the standard protocol without IV contrast. RADIATION DOSE REDUCTION: This exam was performed according to the departmental dose-optimization program which includes automated exposure control, adjustment of the mA and/or kV according to patient size and/or use of iterative reconstruction technique. COMPARISON:  Chest x-ray from earlier in the same day. FINDINGS: CT CHEST FINDINGS Cardiovascular: Somewhat limited due to lack of IV contrast. Atherosclerotic calcifications are noted. No enlargement of the pulmonary artery is seen. No cardiac enlargement is seen. Mediastinum/Nodes: Thoracic inlet is within normal limits. No hilar or mediastinal adenopathy is noted. The esophagus is within normal limits. Lungs/Pleura: Lungs show bilateral lower lobe, left upper lobe and right middle lobe consolidation increased in the interval from the chest x-ray from hours previous. No sizable effusion is seen. Small 4 mm inflammatory nodule is noted in the left upper lobe best seen on image number 69 of series 5. Musculoskeletal: No chest wall mass or suspicious bone lesions identified. CT ABDOMEN PELVIS FINDINGS Hepatobiliary: No focal liver abnormality is seen. No gallstones, gallbladder wall thickening, or biliary dilatation. Pancreas: Unremarkable. No pancreatic ductal dilatation or surrounding inflammatory changes. Spleen: Normal in size without focal abnormality. Adrenals/Urinary Tract: Adrenal glands are within normal limits. Kidneys demonstrate no renal calculi or obstructive changes. Ureters are within normal limits. Bladder is decompressed by Foley catheter. Stomach/Bowel: No obstructive or inflammatory changes of the colon are seen. The appendix is not discretely visualized. No inflammatory changes to suggest appendicitis are noted. Small bowel and stomach are within normal limits. Vascular/Lymphatic:  Aortic atherosclerosis. No enlarged abdominal or pelvic lymph nodes.  Reproductive: Uterine fibroid change is noted. No adnexal mass is seen. Other: No abdominal wall hernia or abnormality. No abdominopelvic ascites. Musculoskeletal: No acute or significant osseous findings. IMPRESSION: Extensive bilateral infiltrates increased when compared with the recent plain film. Small likely postinflammatory nodule in the left upper lobe. No specific follow-up is recommended. Uterine fibroid change. No other focal abnormality is seen. Aortic Atherosclerosis (ICD10-I70.0). Electronically Signed   By: Oneil Devonshire M.D.   On: 05/19/2024 00:54   DG Chest Port 1 View Result Date: 05/18/2024 CLINICAL DATA:  Hypoxia EXAM: PORTABLE CHEST 1 VIEW COMPARISON:  Film from earlier in the same day. FINDINGS: Cardiac shadow is stable. Left lung is clear. Increased consolidation is noted in the right middle lobe along the minor fissure. No sizable effusion is seen. IMPRESSION: Increasing right middle lobe consolidation. Electronically Signed   By: Oneil Devonshire M.D.   On: 05/18/2024 20:44   US  RENAL Result Date: 05/18/2024 CLINICAL DATA:  Acute kidney injury EXAM: RENAL / URINARY TRACT ULTRASOUND COMPLETE COMPARISON:  None Available. FINDINGS: Right Kidney: Renal measurements: 11.7 x 6.8 x 5.8 cm = volume: 237 mL. Normal echotexture. No hydronephrosis. Questionable complex cystic area laterally, not well visualized. Left Kidney: Renal measurements: 11.3 x 5.6 x 5.6 cm = volume: 186 mL. 1.8 cm cyst in the lower pole. Normal echotexture. No hydronephrosis. Bladder: Not visualized. Other: None. IMPRESSION: No acute findings.  No hydronephrosis. Question complex cystic area laterally in the right kidney. This could be further evaluated with contrast-enhanced CT or MRI. Electronically Signed   By: Franky Crease M.D.   On: 05/18/2024 18:01   DG Chest 2 View Result Date: 05/18/2024 CLINICAL DATA:  Shortness of breath EXAM: CHEST - 2 VIEW COMPARISON:  09/13/2018 FINDINGS: The heart size and mediastinal  contours are within normal limits. Both lungs are clear. The visualized skeletal structures are unremarkable. IMPRESSION: No active cardiopulmonary disease. Electronically Signed   By: Oneil Devonshire M.D.   On: 05/18/2024 02:10    Cardiac Studies Echo pending  Patient Profile   58 y.o. female with history of CAD s/p stent of RCA in 2000, DM, HTN seen for evaluation of elevated troponin.  Assessment & Plan  Elevated troponin. Peak 2036 with relatively flat trend. Suspect type 2 MI in setting of sepsis, DKA and ARF. No angina or Ecg changes. Plan Echo today. If EF is ok no further cardiac work up warranted and can stop IV heparin . If EF low she would not be a candidate for invasive evaluation given ARF.  DKA Acute renal failure. Oliguric. Creatinine 5.1 Possible sepsis. No clear source of infection. Antibiotics per CCM   For questions or updates, please contact [facility] Please consult [email or web address] for contact info under       Signed, Sherolyn Trettin [place], MD  05/19/2024, 8:48 AM

## 2024-05-19 NOTE — Consult Note (Addendum)
 Reason for Consult: AKI Referring Physician:  Harold, MD  Danielle Flores is an 58 y.o. female with a PMH significant for DM type 2, HTN, CAD s/p MI 09/2018, and HLD who presented to [facility] ED on 05/18/24 with a 3 day history of runny nose, fever, congestion, cough, SOB, generalized weakness, and 1 day history of N/V.   In the ED, Temp 102.6, Bp 109/82, HR 118, RR 32, SpO2 96%.  Labs notable for WBC 14.3, Na 126, K 2.9, Cl 90, Co2 12, gluc 524, alb 2.6, AST 233, ALT 62, trop I 1322.  Respiratory panel negative.  CXR with increasing right middle lobe consolidation.  Renal US  without hydronephrosis.  She was admitted to the ICU for DKA and NSTEMI.  We have been consulted due to the development of worsening AKI.  The trend in Scr is seen below.  Of note, she was on Jardiance  and metformin  prior to admission.   She was started on Vancomycin 2 gm and cefepime 2 gm since admission.  Vanco switched to Unasyn on 05/18/24.  She was given IV lasix today due to worsening hypoxia and dropping UOP.  She reports that she is feeling better.  She has no prior knowledge of issues with her kidneys but her mother did have advanced CKD but died before starting dialysis.  She does not take NSAIDs or COX-II I's due to plavix  and her CAD.  She denies any N/V at this time and thinks her breathing is better.  She denied any dysuria, pyuria, hematuria, urgency, frequency, or retention.  Trend in Creatinine: Creatinine, Ser  Date/Time Value Ref Range Status  05/19/2024 11:05 AM 5.63 (H) 0.44 - 1.00 mg/dL Final  89/86/7974 93:46 AM 5.10 (H) 0.44 - 1.00 mg/dL Final  89/86/7974 96:81 AM 4.96 (H) 0.44 - 1.00 mg/dL Final  89/87/7974 92:96 PM 4.06 (H) 0.44 - 1.00 mg/dL Final  89/87/7974 96:93 PM 3.78 (H) 0.44 - 1.00 mg/dL Final  89/87/7974 87:81 PM 3.58 (H) 0.44 - 1.00 mg/dL Final  89/87/7974 89:98 AM 3.37 (H) 0.44 - 1.00 mg/dL Final  89/87/7974 98:43 AM 2.96 (H) 0.44 - 1.00 mg/dL Final  96/81/7974 96:71 PM 1.04 0.40 - 1.20 mg/dL  Final  98/96/7974 97:62 PM 1.05 (H) 0.50 - 1.03 mg/dL   93/95/7975 91:44 AM 9.16 0.40 - 1.20 mg/dL Final  88/93/7976 92:47 AM 0.89 0.40 - 1.20 mg/dL Final  94/98/7976 88:83 AM 0.97 0.40 - 1.20 mg/dL Final  88/98/7977 87:41 PM 1.11 0.40 - 1.20 mg/dL Final  92/70/7977 98:62 PM 0.90 0.40 - 1.20 mg/dL Final  93/96/7978 98:57 PM 1.03 0.40 - 1.20 mg/dL Final  96/83/7978 97:58 PM 0.86 0.40 - 1.20 mg/dL Final  94/84/7979 90:43 AM 0.89 0.57 - 1.00 mg/dL Final  97/88/7979 97:67 AM 0.84 0.44 - 1.00 mg/dL Final  97/89/7979 97:49 AM 0.75 0.44 - 1.00 mg/dL Final  97/91/7979 87:69 AM 1.17 (H) 0.44 - 1.00 mg/dL Final  97/92/7979 88:80 AM 1.31 (H) 0.44 - 1.00 mg/dL Final  97/92/7979 96:56 AM 1.92 (H) 0.44 - 1.00 mg/dL Final  97/92/7979 87:60 AM 2.15 (H) 0.44 - 1.00 mg/dL Final  98/95/7980 91:52 AM 0.83 0.40 - 1.20 mg/dL Final  87/75/7981 97:99 PM 0.75 0.44 - 1.00 mg/dL Final  87/75/7981 87:42 PM 0.88 0.44 - 1.00 mg/dL Final  91/82/7984 89:99 AM 1.0 0.4 - 1.2 mg/dL Final  93/87/7985 90:72 AM 0.8 0.4 - 1.2 mg/dL Final  96/96/7985 90:80 AM 0.8 0.4 - 1.2 mg/dL Final  90/93/7986 91:85 AM 0.9 0.4 -  1.2 mg/dL Final  97/98/7986 91:48 AM 1.0 0.4 - 1.2 mg/dL Final  96/91/7987 96:45 PM 0.9 0.4 - 1.2 mg/dL Final  92/75/7990 89:87 AM 0.9 0.4 - 1.2 mg/dL Final  96/79/7990 89:85 AM 1.0 0.4 - 1.2 mg/dL Final  92/96/7991 88:50 AM 0.9 0.4 - 1.2 mg/dL Final    PMH:   Past Medical History:  Diagnosis Date   Hyperglycemia    Hyperlipidemia    Hypertension     PSH:   Past Surgical History:  Procedure Laterality Date   CESAREAN SECTION     CORONARY STENT INTERVENTION N/A 09/16/2018   Procedure: CORONARY STENT INTERVENTION;  Surgeon: Dann Candyce RAMAN, MD;  Location: [facility] INVASIVE CV LAB;  Service: Cardiovascular;  Laterality: N/A;   LEFT HEART CATH AND CORONARY ANGIOGRAPHY N/A 09/16/2018   Procedure: LEFT HEART CATH AND CORONARY ANGIOGRAPHY;  Surgeon: Dann Candyce RAMAN, MD;  Location: [facility] INVASIVE CV LAB;   Service: Cardiovascular;  Laterality: N/A;    Allergies:  Allergies  Allergen Reactions   Lisinopril  Hives, Itching and Swelling    Lip swelling, itching from head to toe, hives chest, breast, and abdomen.   Atorvastatin  Other (See Comments)    Myalgias   Red Blood Cells     Jehova's witness    Medications:   Prior to Admission medications   Medication Sig Start Date End Date Taking? Authorizing Provider  amLODipine  (NORVASC ) 10 MG tablet Take 1 tablet (10 mg total) by mouth daily. 04/08/24  Yes Antonio Cyndee Rockers R, DO  clopidogrel  (PLAVIX ) 75 MG tablet Take 1 tablet (75 mg total) by mouth daily. 01/30/24  Yes Fountain, Madison L, NP  ezetimibe  (ZETIA ) 10 MG tablet Take 1 tablet by mouth once daily 12/05/23  Yes Fountain, Madison L, NP  insulin  isophane & regular human KwikPen (NOVOLIN  70/30 KWIKPEN) (70-30) 100 UNIT/ML KwikPen Inject 26 Units into the skin in the morning and at bedtime. 04/18/24  Yes Antonio Cyndee Rockers R, DO  metoprolol  tartrate (LOPRESSOR ) 100 MG tablet Take 1 tablet (100 mg total) by mouth 3 (three) times daily. 12/19/23 05/18/25 Yes Fountain, Madison L, NP  rosuvastatin  (CRESTOR ) 40 MG tablet Take 1 tablet (40 mg total) by mouth daily. Needs an appt 05/12/24  Yes Antonio Cyndee Rockers R, DO  blood glucose meter kit and supplies Relion Prime or Dispense other brand based on patient and insurance preference. Use up to four times daily as directed. (FOR ICD-9 250.00, 250.01). 09/17/18   Pearlean Manus, MD  empagliflozin  (JARDIANCE ) 25 MG TABS tablet TAKE 1 TABLET BY MOUTH ONCE DAILY BEFORE BREAKFAST Patient not taking: Reported on 05/18/2024 11/05/23   Lowne Chase, Yvonne R, DO  metFORMIN  (GLUCOPHAGE ) 1000 MG tablet TAKE 1/2 (ONE-HALF) TABLET BY MOUTH TWICE DAILY WITH A MEAL 05/19/24   Lowne Chase, Yvonne R, DO    Inpatient medications:  [START ON 05/20/2024] aspirin  EC  81 mg Oral Daily   Chlorhexidine Gluconate Cloth  6 each Topical Daily   clopidogrel   75 mg Oral Daily    rosuvastatin   10 mg Oral QPM    Discontinued Meds:   Medications Discontinued During This Encounter  Medication Reason   vancomycin (VANCOCIN) IVPB 1000 mg/200 mL premix Dose change   insulin  aspart (novoLOG ) injection 0-15 Units    fluconazole  (DIFLUCAN ) 150 MG tablet Completed Course   sulfamethoxazole -trimethoprim  (BACTRIM  DS) 800-160 MG tablet Completed Course   insulin  glargine (LANTUS  SOLOSTAR) 100 UNIT/ML Solostar Pen Discontinued by provider   ceFEPIme (MAXIPIME) 2 g in sodium chloride   0.9 % 100 mL IVPB    vancomycin (VANCOREADY) IVPB 750 mg/150 mL    potassium chloride  SA (KLOR-CON  M) CR tablet 40 mEq    rosuvastatin  (CRESTOR ) tablet 20 mg    Ampicillin-Sulbactam (UNASYN) 3 g in sodium chloride  0.9 % 100 mL IVPB    0.9 %  sodium chloride  infusion    norepinephrine (LEVOPHED) 4mg  in 250mL (0.016 mg/mL) premix infusion    aspirin  chewable tablet 81 mg     Social History:  reports that she has never smoked. She has never used smokeless tobacco. She reports current alcohol use. She reports that she does not use drugs.  Family History:   Family History  Problem Relation Age of Onset   Diabetes Mother    Diabetes Sister    Sickle cell anemia Paternal Aunt    Heart disease Maternal Grandmother        heart enlargement   Diabetes Other        1st degree relative    Pertinent items are noted in HPI. Weight change: 14 kg  Intake/Output Summary (Last 24 hours) at 05/19/2024 1339 Last data filed at 05/19/2024 1200 Gross per 24 hour  Intake 6602.23 ml  Output 320 ml  Net 6282.23 ml   BP 131/68 (BP Location: Right Arm)   Pulse (!) 120   Temp 98.1 F (36.7 C) (Oral)   Resp (!) 28   Ht 5' 5 (1.651 m)   Wt 107 kg   LMP  (LMP Unknown)   SpO2 98%   BMI 39.25 kg/m  Vitals:   05/19/24 1000 05/19/24 1100 05/19/24 1142 05/19/24 1200  BP: 128/64 135/66  131/68  Pulse: (!) 112 (!) 117  (!) 120  Resp: (!) 29 (!) 29  (!) 28  Temp:   98.1 F (36.7 C)   TempSrc:   Oral    SpO2: 97% 95%  98%  Weight:      Height:         General appearance: fatigued and no distress Head: Normocephalic, without obvious abnormality, atraumatic Resp: diminished breath sounds bilaterally Cardio: no rub and tachycardic GI: soft, non-tender; bowel sounds normal; no masses,  no organomegaly Extremities: extremities normal, atraumatic, no cyanosis or edema  Labs: Basic Metabolic Panel: Recent Labs  Lab 05/18/24 0156 05/18/24 1001 05/18/24 1218 05/18/24 1506 05/18/24 1903 05/18/24 2049 05/19/24 0318 05/19/24 0653 05/19/24 1105  NA 126* 132* 129* 128* 125* 127* 129* 128* 131*  K 2.9* 3.0* 3.2* 3.9 4.8 4.6 4.6 4.7 4.5  CL 90* 91* 96* 97* 96*  --  100 99 102  CO2 12* 17* 17* 16* 13*  --  12* 11* 14*  GLUCOSE 524* 236* 224* 252* 338*  --  172* 159* 158*  BUN 18 22* 23* 24* 25*  --  28* 29* 32*  CREATININE 2.96* 3.37* 3.58* 3.78* 4.06*  --  4.96* 5.10* 5.63*  ALBUMIN 2.6*  --   --   --   --   --   --   --   --   CALCIUM  8.4* 7.8* 7.8* 7.8* 7.2*  --  7.7* 7.4* 6.9*  PHOS  --  1.6*  --   --  3.0  --   --   --   --    Liver Function Tests: Recent Labs  Lab 05/18/24 0156  AST 233*  ALT 62*  ALKPHOS 84  BILITOT 0.9  PROT 7.3  ALBUMIN 2.6*   No results for input(s): LIPASE, AMYLASE in the  last 168 hours. No results for input(s): AMMONIA in the last 168 hours. CBC: Recent Labs  Lab 05/18/24 0156 05/18/24 0331 05/18/24 1001 05/18/24 2049 05/19/24 0318  WBC 14.3* 13.4* 10.4  --  12.5*  NEUTROABS 10.9* 10.5*  --   --   --   HGB 13.8 13.8 13.1 12.9 14.3  HCT 40.1 40.8 38.0 38.0 41.9  MCV 69.5* 70.1* 69.3*  --  68.5*  PLT 161 127* 118*  --  135*   PT/INR: @LABRCNTIP (inr:5) Cardiac Enzymes: )No results for input(s): CKTOTAL, CKMB, CKMBINDEX, TROPONINI in the last 168 hours. CBG: Recent Labs  Lab 05/19/24 0506 05/19/24 0611 05/19/24 0747 05/19/24 0845 05/19/24 1144  GLUCAP 180* 186* 184* 142* 151*    Iron Studies: No results for  input(s): IRON, TIBC, TRANSFERRIN, FERRITIN in the last 168 hours.  Xrays/Other Studies: CT CHEST ABDOMEN PELVIS WO CONTRAST Result Date: 05/19/2024 CLINICAL DATA:  Sepsis EXAM: CT CHEST, ABDOMEN AND PELVIS WITHOUT CONTRAST TECHNIQUE: Multidetector CT imaging of the chest, abdomen and pelvis was performed following the standard protocol without IV contrast. RADIATION DOSE REDUCTION: This exam was performed according to the departmental dose-optimization program which includes automated exposure control, adjustment of the mA and/or kV according to patient size and/or use of iterative reconstruction technique. COMPARISON:  Chest x-ray from earlier in the same day. FINDINGS: CT CHEST FINDINGS Cardiovascular: Somewhat limited due to lack of IV contrast. Atherosclerotic calcifications are noted. No enlargement of the pulmonary artery is seen. No cardiac enlargement is seen. Mediastinum/Nodes: Thoracic inlet is within normal limits. No hilar or mediastinal adenopathy is noted. The esophagus is within normal limits. Lungs/Pleura: Lungs show bilateral lower lobe, left upper lobe and right middle lobe consolidation increased in the interval from the chest x-ray from hours previous. No sizable effusion is seen. Small 4 mm inflammatory nodule is noted in the left upper lobe best seen on image number 69 of series 5. Musculoskeletal: No chest wall mass or suspicious bone lesions identified. CT ABDOMEN PELVIS FINDINGS Hepatobiliary: No focal liver abnormality is seen. No gallstones, gallbladder wall thickening, or biliary dilatation. Pancreas: Unremarkable. No pancreatic ductal dilatation or surrounding inflammatory changes. Spleen: Normal in size without focal abnormality. Adrenals/Urinary Tract: Adrenal glands are within normal limits. Kidneys demonstrate no renal calculi or obstructive changes. Ureters are within normal limits. Bladder is decompressed by Foley catheter. Stomach/Bowel: No obstructive or  inflammatory changes of the colon are seen. The appendix is not discretely visualized. No inflammatory changes to suggest appendicitis are noted. Small bowel and stomach are within normal limits. Vascular/Lymphatic: Aortic atherosclerosis. No enlarged abdominal or pelvic lymph nodes. Reproductive: Uterine fibroid change is noted. No adnexal mass is seen. Other: No abdominal wall hernia or abnormality. No abdominopelvic ascites. Musculoskeletal: No acute or significant osseous findings. IMPRESSION: Extensive bilateral infiltrates increased when compared with the recent plain film. Small likely postinflammatory nodule in the left upper lobe. No specific follow-up is recommended. Uterine fibroid change. No other focal abnormality is seen. Aortic Atherosclerosis (ICD10-I70.0). Electronically Signed   By: Oneil Devonshire M.D.   On: 05/19/2024 00:54   DG Chest Port 1 View Result Date: 05/18/2024 CLINICAL DATA:  Hypoxia EXAM: PORTABLE CHEST 1 VIEW COMPARISON:  Film from earlier in the same day. FINDINGS: Cardiac shadow is stable. Left lung is clear. Increased consolidation is noted in the right middle lobe along the minor fissure. No sizable effusion is seen. IMPRESSION: Increasing right middle lobe consolidation. Electronically Signed   By: Oneil Devonshire M.D.   On:  05/18/2024 20:44   US  RENAL Result Date: 05/18/2024 CLINICAL DATA:  Acute kidney injury EXAM: RENAL / URINARY TRACT ULTRASOUND COMPLETE COMPARISON:  None Available. FINDINGS: Right Kidney: Renal measurements: 11.7 x 6.8 x 5.8 cm = volume: 237 mL. Normal echotexture. No hydronephrosis. Questionable complex cystic area laterally, not well visualized. Left Kidney: Renal measurements: 11.3 x 5.6 x 5.6 cm = volume: 186 mL. 1.8 cm cyst in the lower pole. Normal echotexture. No hydronephrosis. Bladder: Not visualized. Other: None. IMPRESSION: No acute findings.  No hydronephrosis. Question complex cystic area laterally in the right kidney. This could be further  evaluated with contrast-enhanced CT or MRI. Electronically Signed   By: Franky Crease M.D.   On: 05/18/2024 18:01   DG Chest 2 View Result Date: 05/18/2024 CLINICAL DATA:  Shortness of breath EXAM: CHEST - 2 VIEW COMPARISON:  09/13/2018 FINDINGS: The heart size and mediastinal contours are within normal limits. Both lungs are clear. The visualized skeletal structures are unremarkable. IMPRESSION: No active cardiopulmonary disease. Electronically Signed   By: Oneil Devonshire M.D.   On: 05/18/2024 02:10     Assessment/Plan:  AKI - in setting of DKA.  UA with gluc, blood, and protein.  Negative nitrite and renal US  without obstruction.  Likely ischemic ATN in setting of volume depletion and acute illness as well as hypotension (her Bp dropped to 89/43 earlier today.  No urgent indication for dialysis, however we did discuss the possible need for HD if her renal function and/or respiratory status were to continue to worsen.   Will continue to follow closely. Avoid nephrotoxic medications including NSAIDs and iodinated intravenous contrast exposure unless the latter is absolutely indicated.   Preferred narcotic agents for pain control are hydromorphone, fentanyl , and methadone. Morphine should not be used.  Avoid Baclofen and avoid oral sodium phosphate and magnesium citrate based laxatives / bowel preps.  Continue strict Input and Output monitoring.  Will monitor the patient closely with you and intervene or adjust therapy as indicated by changes in clinical status/labs  Severe sepsis with septic shock due to RML pneumonia - currently on Zosyn and doxycycline.  Given 1 dose of Vancomycin 2 grams upon presentation. Acute hypoxic respiratory failure - currently on nasal cannula 8 L.  Per PCCM Acute NSTEMI - on IV heparin  and being followed by Cardiology. Hyponatremia - due to DKA and AKI Hypokalemia - due to DKA. IMproved DKA - on insulin  drip per PCCM Abnormal LFT's - likely shock liver.    Thrombocytopenia - due to acute critical illness.  Continue to monitor. AGMA - due to #1 and #7.  Metformin  stopped.   Fairy LABOR [employer] 05/19/2024, 1:39 PM    The patient is critically ill with DKA and which includes my role to primarily manage AKI, metabolic acidosis, hyponatremia, and hypokalemia.  This requires high complexity decision making.  Total critical care time: 30 minutes    Critical care time was exclusive of treating other patients.   Critical care was necessary to treat or prevent imminent or life-threatening deterioration.   Critical care was time spent personally by me on the following activities:   development of treatment plan with patient and/or surrogate as well as nursing,   discussions with other provider evaluation of patient's response to treatment  examination of patient  obtaining history from patient or surrogate  ordering and performing treatments and interventions  ordering and review of laboratory studies  ordering and review of radiographic studies

## 2024-05-19 NOTE — Inpatient Diabetes Management (Signed)
 Inpatient Diabetes Program Recommendations  AACE/ADA: New Consensus Statement on Inpatient Glycemic Control (2015)  Target Ranges:  Prepandial:   less than 140 mg/dL      Peak postprandial:   less than 180 mg/dL (1-2 hours)      Critically ill patients:  140 - 180 mg/dL   Lab Results  Component Value Date   GLUCAP 142 (H) 05/19/2024   HGBA1C 7.6 (H) 10/23/2023    Latest Reference Range & Units 05/19/24 06:53  Sodium 135 - 145 mmol/L 128 (L)  Potassium 3.5 - 5.1 mmol/L 4.7  Chloride 98 - 111 mmol/L 99  CO2 22 - 32 mmol/L 11 (L)  Glucose 70 - 99 mg/dL 840 (H)  BUN 6 - 20 mg/dL 29 (H)  Creatinine 9.55 - 1.00 mg/dL 4.89 (H)  Calcium  8.9 - 10.3 mg/dL 7.4 (L)  Anion gap 5 - 15  18 (H)  (L): Data is abnormally low (H): Data is abnormally high  Diabetes history: DM2 Outpatient Diabetes medications:  70/30 26 units bid Metformin  500 mg bid Jardiance  25 daily (note states not taking) Current orders for Inpatient glycemic control: IV insulin   Inpatient Diabetes Program Recommendations:   Agree with continue with IV insulin .  Thank you, Jaxxon Naeem E. Audree Schrecengost, RN, MSN, CNS, CDCES  Diabetes Coordinator Inpatient Glycemic Control Team Team Pager [phone] (8am-5pm) 05/19/2024 9:40 AM

## 2024-05-19 NOTE — Progress Notes (Signed)
 PHARMACY - ANTICOAGULATION Pharmacy Consult:  Heparin  Indication: ACS/NSTEMI   Allergies  Allergen Reactions   Lisinopril  Hives, Itching and Swelling    Lip swelling, itching from head to toe, hives chest, breast, and abdomen.   Atorvastatin  Other (See Comments)    Myalgias   Red Blood Cells     Jehova's witness    Patient Measurements: Height: 5' 5 (165.1 cm) Weight: 107 kg (235 lb 14.3 oz) IBW/kg (Calculated) : 57 HEPARIN  DW (KG): 77.8  Vital Signs: Temp: 98.6 F (37 C) (10/13 0400) Temp Source: Oral (10/13 0400) BP: 124/72 (10/13 0600) Pulse Rate: 115 (10/13 0600)  Labs: Recent Labs    05/18/24 0156 05/18/24 0331 05/18/24 1001 05/18/24 1002 05/18/24 1218 05/18/24 1506 05/18/24 1731 05/18/24 1903 05/18/24 2049 05/19/24 0318 05/19/24 0653  HGB 13.8 13.8 13.1  --   --   --   --   --  12.9 14.3  --   HCT 40.1 40.8 38.0  --   --   --   --   --  38.0 41.9  --   PLT 161 127* 118*  --   --   --   --   --   --  135*  --   LABPROT 15.8*  --   --   --   --   --   --   --   --   --   --   INR 1.2  --   --   --   --   --   --   --   --   --   --   HEPARINUNFRC  --   --   --   --   --   --  0.38  --   --  0.36  --   CREATININE 2.96*  --  3.37*  --    < > 3.78*  --  4.06*  --  4.96* 5.10*  TROPONINIHS 1,322* 1,521*  --  1,781*  --  2,036* 1,254*  --   --   --   --    < > = values in this interval not displayed.    Estimated Creatinine Clearance: 14.3 mL/min (A) (by C-G formula based on SCr of 5.1 mg/dL (H)).   Assessment: 89 YOF presented with SOB and flu-like symptoms.  Found to have sepsis, DKA and NSTEMI.  Pharmacy consulted to dose IV heparin   Heparin  level therapeutic (0.36) on infusion at 950 units/hr. No bleeding noted.  Goal of Therapy:  Heparin  level 0.3-0.7 units/ml Monitor platelets by anticoagulation protocol: Yes   Plan:  Continue heparin  gtt at 950 units/hr Daily heparin  level, CBC F/u plans for ischemic workup vs med management  Maurilio Fila,  PharmD Clinical Pharmacist 05/19/2024  8:01 AM

## 2024-05-20 ENCOUNTER — Encounter (HOSPITAL_COMMUNITY): Payer: Self-pay

## 2024-05-20 ENCOUNTER — Inpatient Hospital Stay (HOSPITAL_COMMUNITY)

## 2024-05-20 DIAGNOSIS — A419 Sepsis, unspecified organism: Secondary | ICD-10-CM | POA: Diagnosis not present

## 2024-05-20 DIAGNOSIS — R6521 Severe sepsis with septic shock: Secondary | ICD-10-CM | POA: Diagnosis not present

## 2024-05-20 DIAGNOSIS — E1110 Type 2 diabetes mellitus with ketoacidosis without coma: Secondary | ICD-10-CM | POA: Diagnosis not present

## 2024-05-20 DIAGNOSIS — J9601 Acute respiratory failure with hypoxia: Secondary | ICD-10-CM | POA: Diagnosis not present

## 2024-05-20 DIAGNOSIS — R7989 Other specified abnormal findings of blood chemistry: Secondary | ICD-10-CM

## 2024-05-20 DIAGNOSIS — N179 Acute kidney failure, unspecified: Secondary | ICD-10-CM

## 2024-05-20 DIAGNOSIS — Z992 Dependence on renal dialysis: Secondary | ICD-10-CM

## 2024-05-20 HISTORY — DX: Dependence on renal dialysis: Z99.2

## 2024-05-20 LAB — BASIC METABOLIC PANEL WITH GFR
Anion gap: 16 — ABNORMAL HIGH (ref 5–15)
Anion gap: 18 — ABNORMAL HIGH (ref 5–15)
Anion gap: 18 — ABNORMAL HIGH (ref 5–15)
Anion gap: 12 (ref 5–15)
BUN: 43 mg/dL — ABNORMAL HIGH (ref 6–20)
BUN: 46 mg/dL — ABNORMAL HIGH (ref 6–20)
BUN: 52 mg/dL — ABNORMAL HIGH (ref 6–20)
BUN: 44 mg/dL — ABNORMAL HIGH (ref 6–20)
CO2: 15 mmol/L — ABNORMAL LOW (ref 22–32)
CO2: 16 mmol/L — ABNORMAL LOW (ref 22–32)
CO2: 16 mmol/L — ABNORMAL LOW (ref 22–32)
CO2: 20 mmol/L — ABNORMAL LOW (ref 22–32)
Calcium: 7 mg/dL — ABNORMAL LOW (ref 8.9–10.3)
Calcium: 6.9 mg/dL — ABNORMAL LOW (ref 8.9–10.3)
Calcium: 7.1 mg/dL — ABNORMAL LOW (ref 8.9–10.3)
Calcium: 7.2 mg/dL — ABNORMAL LOW (ref 8.9–10.3)
Chloride: 101 mmol/L (ref 98–111)
Chloride: 98 mmol/L (ref 98–111)
Chloride: 99 mmol/L (ref 98–111)
Chloride: 102 mmol/L (ref 98–111)
Creatinine, Ser: 6.82 mg/dL — ABNORMAL HIGH (ref 0.44–1.00)
Creatinine, Ser: 7.26 mg/dL — ABNORMAL HIGH (ref 0.44–1.00)
Creatinine, Ser: 7.43 mg/dL — ABNORMAL HIGH (ref 0.44–1.00)
Creatinine, Ser: 5.54 mg/dL — ABNORMAL HIGH (ref 0.44–1.00)
GFR, Estimated: 6 mL/min — ABNORMAL LOW (ref >60)
GFR, Estimated: 6 mL/min — ABNORMAL LOW (ref >60)
GFR, Estimated: 6 mL/min — ABNORMAL LOW (ref >60)
GFR, Estimated: 8 mL/min — ABNORMAL LOW (ref >60)
Glucose, Bld: 146 mg/dL — ABNORMAL HIGH (ref 70–99)
Glucose, Bld: 146 mg/dL — ABNORMAL HIGH (ref 70–99)
Glucose, Bld: 141 mg/dL — ABNORMAL HIGH (ref 70–99)
Glucose, Bld: 106 mg/dL — ABNORMAL HIGH (ref 70–99)
Potassium: 4.1 mmol/L (ref 3.5–5.1)
Potassium: 4.4 mmol/L (ref 3.5–5.1)
Potassium: 4.5 mmol/L (ref 3.5–5.1)
Potassium: 4.2 mmol/L (ref 3.5–5.1)
Sodium: 132 mmol/L — ABNORMAL LOW (ref 135–145)
Sodium: 132 mmol/L — ABNORMAL LOW (ref 135–145)
Sodium: 133 mmol/L — ABNORMAL LOW (ref 135–145)
Sodium: 134 mmol/L — ABNORMAL LOW (ref 135–145)

## 2024-05-20 LAB — GLUCOSE, CAPILLARY
Glucose-Capillary: 107 mg/dL — ABNORMAL HIGH (ref 70–99)
Glucose-Capillary: 108 mg/dL — ABNORMAL HIGH (ref 70–99)
Glucose-Capillary: 121 mg/dL — ABNORMAL HIGH (ref 70–99)
Glucose-Capillary: 122 mg/dL — ABNORMAL HIGH (ref 70–99)
Glucose-Capillary: 125 mg/dL — ABNORMAL HIGH (ref 70–99)
Glucose-Capillary: 134 mg/dL — ABNORMAL HIGH (ref 70–99)
Glucose-Capillary: 135 mg/dL — ABNORMAL HIGH (ref 70–99)
Glucose-Capillary: 135 mg/dL — ABNORMAL HIGH (ref 70–99)
Glucose-Capillary: 138 mg/dL — ABNORMAL HIGH (ref 70–99)
Glucose-Capillary: 141 mg/dL — ABNORMAL HIGH (ref 70–99)
Glucose-Capillary: 144 mg/dL — ABNORMAL HIGH (ref 70–99)
Glucose-Capillary: 147 mg/dL — ABNORMAL HIGH (ref 70–99)
Glucose-Capillary: 151 mg/dL — ABNORMAL HIGH (ref 70–99)
Glucose-Capillary: 159 mg/dL — ABNORMAL HIGH (ref 70–99)
Glucose-Capillary: 96 mg/dL (ref 70–99)

## 2024-05-20 LAB — HEMOGLOBIN AND HEMATOCRIT, BLOOD
HCT: 28.8 % — ABNORMAL LOW (ref 36.0–46.0)
Hemoglobin: 10.2 g/dL — ABNORMAL LOW (ref 12.0–15.0)

## 2024-05-20 LAB — CBC
HCT: 29.5 % — ABNORMAL LOW (ref 36.0–46.0)
Hemoglobin: 10.5 g/dL — ABNORMAL LOW (ref 12.0–15.0)
MCH: 24.2 pg — ABNORMAL LOW (ref 26.0–34.0)
MCHC: 35.6 g/dL (ref 30.0–36.0)
MCV: 68 fL — ABNORMAL LOW (ref 80.0–100.0)
Platelets: UNDETERMINED K/uL (ref 150–400)
RBC: 4.34 MIL/uL (ref 3.87–5.11)
RDW: 15.9 % — ABNORMAL HIGH (ref 11.5–15.5)
WBC: 8.7 K/uL (ref 4.0–10.5)
nRBC: 0 % (ref 0.0–0.2)

## 2024-05-20 LAB — HEPARIN LEVEL (UNFRACTIONATED): Heparin Unfractionated: 0.58 [IU]/mL (ref 0.30–0.70)

## 2024-05-20 LAB — BETA-HYDROXYBUTYRIC ACID
Beta-Hydroxybutyric Acid: 0.2 mmol/L (ref 0.05–0.27)
Beta-Hydroxybutyric Acid: 0.17 mmol/L (ref 0.05–0.27)
Beta-Hydroxybutyric Acid: 0.2 mmol/L (ref 0.05–0.27)
Beta-Hydroxybutyric Acid: 0.39 mmol/L — ABNORMAL HIGH (ref 0.05–0.27)

## 2024-05-20 LAB — PLATELET COUNT: Platelets: UNDETERMINED K/uL (ref 150–400)

## 2024-05-20 LAB — LACTIC ACID, PLASMA
Lactic Acid, Venous: 1.8 mmol/L (ref 0.5–1.9)
Lactic Acid, Venous: 1.4 mmol/L (ref 0.5–1.9)

## 2024-05-20 LAB — RENAL FUNCTION PANEL
Albumin: 1.6 g/dL — ABNORMAL LOW (ref 3.5–5.0)
Anion gap: 16 — ABNORMAL HIGH (ref 5–15)
BUN: 45 mg/dL — ABNORMAL HIGH (ref 6–20)
CO2: 18 mmol/L — ABNORMAL LOW (ref 22–32)
Calcium: 7.1 mg/dL — ABNORMAL LOW (ref 8.9–10.3)
Chloride: 98 mmol/L (ref 98–111)
Creatinine, Ser: 5.67 mg/dL — ABNORMAL HIGH (ref 0.44–1.00)
GFR, Estimated: 8 mL/min — ABNORMAL LOW (ref >60)
Glucose, Bld: 156 mg/dL — ABNORMAL HIGH (ref 70–99)
Phosphorus: 3.1 mg/dL (ref 2.5–4.6)
Potassium: 4.2 mmol/L (ref 3.5–5.1)
Sodium: 132 mmol/L — ABNORMAL LOW (ref 135–145)

## 2024-05-20 LAB — PHOSPHORUS: Phosphorus: 4.1 mg/dL (ref 2.5–4.6)

## 2024-05-20 MED ORDER — PROSOURCE PLUS PO LIQD
30.0000 mL | Freq: Two times a day (BID) | ORAL | Status: DC
  Administered 2024-05-20 – 2024-05-28 (×11): 30 mL via ORAL
  Filled 2024-05-20 (×16): qty 30

## 2024-05-20 MED ORDER — HEPARIN SODIUM (PORCINE) 5000 UNIT/ML IJ SOLN
5000.0000 [IU] | Freq: Three times a day (TID) | INTRAMUSCULAR | Status: DC
  Administered 2024-05-20 – 2024-06-03 (×35): 5000 [IU] via SUBCUTANEOUS
  Filled 2024-05-20 (×36): qty 1

## 2024-05-20 MED ORDER — PRISMASOL BGK 4/2.5 32-4-2.5 MEQ/L EC SOLN: Status: DC

## 2024-05-20 MED ORDER — RENA-VITE PO TABS
1.0000 | ORAL_TABLET | Freq: Every day | ORAL | Status: DC
  Administered 2024-05-20 – 2024-06-03 (×15): 1 via ORAL
  Filled 2024-05-20 (×15): qty 1

## 2024-05-20 MED ORDER — HEPARIN SODIUM (PORCINE) 1000 UNIT/ML DIALYSIS
1000.0000 [IU] | INTRAMUSCULAR | Status: DC | PRN
  Administered 2024-05-22: 3000 [IU] via INTRAVENOUS_CENTRAL
  Filled 2024-05-20: qty 5
  Filled 2024-05-20: qty 3

## 2024-05-20 MED ORDER — HEPARIN (PORCINE) 2000 UNITS/L FOR CRRT: INTRAVENOUS_CENTRAL | Status: DC | PRN

## 2024-05-20 MED ORDER — DOXYCYCLINE HYCLATE 100 MG PO TABS
100.0000 mg | ORAL_TABLET | Freq: Two times a day (BID) | ORAL | Status: AC
  Administered 2024-05-20 – 2024-05-27 (×14): 100 mg via ORAL
  Filled 2024-05-20 (×14): qty 1

## 2024-05-20 MED ORDER — ENSURE PLUS HIGH PROTEIN PO LIQD
237.0000 mL | Freq: Two times a day (BID) | ORAL | Status: DC
  Administered 2024-05-20 – 2024-06-02 (×11): 237 mL via ORAL

## 2024-05-20 MED ORDER — DEXTROSE IN LACTATED RINGERS 5 % IV SOLN: INTRAVENOUS | Status: DC

## 2024-05-20 MED ORDER — SODIUM CHLORIDE 0.9 % IV SOLN: INTRAVENOUS | Status: AC | PRN

## 2024-05-20 NOTE — Plan of Care (Signed)
  Problem: Education: Goal: Ability to describe self-care measures that may prevent or decrease complications (Diabetes Survival Skills Education) will improve Outcome: Progressing Goal: Individualized Educational Video(s) Outcome: Progressing   Problem: Coping: Goal: Ability to adjust to condition or change in health will improve Outcome: Progressing   Problem: Health Behavior/Discharge Planning: Goal: Ability to identify and utilize available resources and services will improve Outcome: Progressing Goal: Ability to manage health-related needs will improve Outcome: Progressing   Problem: Metabolic: Goal: Ability to maintain appropriate glucose levels will improve Outcome: Progressing   Problem: Nutritional: Goal: Maintenance of adequate nutrition will improve Outcome: Progressing   Problem: Skin Integrity: Goal: Risk for impaired skin integrity will decrease Outcome: Progressing   Problem: Tissue Perfusion: Goal: Adequacy of tissue perfusion will improve Outcome: Progressing   Problem: Education: Goal: Knowledge of General Education information will improve Description: Including pain rating scale, medication(s)/side effects and non-pharmacologic comfort measures Outcome: Progressing   Problem: Health Behavior/Discharge Planning: Goal: Ability to manage health-related needs will improve Outcome: Progressing   Problem: Clinical Measurements: Goal: Will remain free from infection Outcome: Progressing Goal: Respiratory complications will improve Outcome: Progressing Goal: Cardiovascular complication will be avoided Outcome: Progressing   Problem: Coping: Goal: Level of anxiety will decrease Outcome: Progressing   Problem: Elimination: Goal: Will not experience complications related to bowel motility Outcome: Progressing   Problem: Pain Managment: Goal: General experience of comfort will improve and/or be controlled Outcome: Progressing   Problem:  Safety: Goal: Ability to remain free from injury will improve Outcome: Progressing   Problem: Skin Integrity: Goal: Risk for impaired skin integrity will decrease Outcome: Progressing

## 2024-05-20 NOTE — Procedures (Signed)
 Central Venous Catheter Insertion Procedure Note  Danielle Flores  [id number]  August 16, 1965  Date:05/20/24  Time:11:22 AM   Provider Performing:Taneil Lazarus   Procedure: Insertion of Non-tunneled Central Venous Catheter(36556)with US  guidance (23062)    Indication(s) Hemodialysis  Consent Risks of the procedure as well as the alternatives and risks of each were explained to the patient and/or caregiver.  Consent for the procedure was obtained and is signed in the bedside chart  Anesthesia Topical only with 1% lidocaine    Timeout Verified patient identification, verified procedure, site/side was marked, verified correct patient position, special equipment/implants available, medications/allergies/relevant history reviewed, required imaging and test results available.  Sterile Technique Maximal sterile technique including full sterile barrier drape, hand hygiene, sterile gown, sterile gloves, mask, hair covering, sterile ultrasound probe cover (if used).  Procedure Description Area of catheter insertion was cleaned with chlorhexidine and draped in sterile fashion.   With real-time ultrasound guidance a HD catheter was placed into the right internal jugular vein.  Nonpulsatile blood flow and easy flushing noted in all ports.  The catheter was sutured in place and sterile dressing applied.  Complications/Tolerance None; patient tolerated the procedure well. Chest X-ray is ordered to verify placement for internal jugular or subclavian cannulation.  Chest x-ray is not ordered for femoral cannulation.  EBL Minimal  Specimen(s) None

## 2024-05-20 NOTE — Progress Notes (Signed)
 Patient ID: Danielle Flores, female   DOB: [birth year], 58 y.o.   MRN: [id number] S: Had worsening WOB and was on BiPAP last night.  No UOP and worsening BUN/Cr.  She reports that she feels better this morning. O:BP (!) 144/68 (BP Location: Left Arm)   Pulse (!) 116   Temp 98.8 F (37.1 C) (Oral)   Resp (!) 34   Ht 5' 5 (1.651 m)   Wt 107.3 kg   LMP  (LMP Unknown)   SpO2 98%   BMI 39.36 kg/m   Intake/Output Summary (Last 24 hours) at 05/20/2024 0921 Last data filed at 05/20/2024 0800 Gross per 24 hour  Intake 2807.99 ml  Output 33 ml  Net 2774.99 ml   Intake/Output: I/O last 3 completed shifts: In: 5123.7 [I.V.:3980.2; IV Piggyback:1143.6] Out: 213 [Urine:213]  Intake/Output this shift:  Total I/O In: 293.9 [I.V.:123; IV Piggyback:170.9] Out: -  Weight change: 0.3 kg Gen: NAD CVS: tachy Resp: diminished BS bilaterally Abd: obese, +BS, soft, NT/ND Ext: trace pretibial edema  Recent Labs  Lab 05/18/24 0156 05/18/24 1001 05/18/24 1218 05/18/24 1903 05/18/24 2049 05/19/24 0318 05/19/24 0653 05/19/24 1105 05/19/24 1414 05/19/24 2014 05/19/24 2319 05/20/24 0647  NA 126* 132*   < > 125*   < > 129* 128* 131* 127* 131* 132* 132*  K 2.9* 3.0*   < > 4.8   < > 4.6 4.7 4.5 4.3 4.3 4.1 4.4  CL 90* 91*   < > 96*  --  100 99 102 101 99 101 98  CO2 12* 17*   < > 13*  --  12* 11* 14* 14* 17* 15* 16*  GLUCOSE 524* 236*   < > 338*  --  172* 159* 158* 187* 193* 146* 146*  BUN 18 22*   < > 25*  --  28* 29* 32* 34* 40* 43* 46*  CREATININE 2.96* 3.37*   < > 4.06*  --  4.96* 5.10* 5.63* 6.18* 6.52* 6.82* 7.26*  ALBUMIN 2.6*  --   --   --   --   --   --   --   --   --   --   --   CALCIUM  8.4* 7.8*   < > 7.2*  --  7.7* 7.4* 6.9* 7.0* 7.0* 7.0* 6.9*  PHOS  --  1.6*  --  3.0  --   --   --   --   --   --   --   --   AST 233*  --   --   --   --   --   --   --   --   --   --   --   ALT 62*  --   --   --   --   --   --   --   --   --   --   --    < > = values in this interval not  displayed.   Liver Function Tests: Recent Labs  Lab 05/18/24 0156  AST 233*  ALT 62*  ALKPHOS 84  BILITOT 0.9  PROT 7.3  ALBUMIN 2.6*   No results for input(s): LIPASE, AMYLASE in the last 168 hours. No results for input(s): AMMONIA in the last 168 hours. CBC: Recent Labs  Lab 05/18/24 0156 05/18/24 0331 05/18/24 1001 05/18/24 2049 05/19/24 0318 05/20/24 0422 05/20/24 0647  WBC 14.3* 13.4* 10.4  --  12.5* 8.7  --   NEUTROABS  10.9* 10.5*  --   --   --   --   --   HGB 13.8 13.8 13.1   < > 14.3 10.5* 10.2*  HCT 40.1 40.8 38.0   < > 41.9 29.5* 28.8*  MCV 69.5* 70.1* 69.3*  --  68.5* 68.0*  --   PLT 161 127* 118*  --  135* PLATELET CLUMPS NOTED ON SMEAR, UNABLE TO ESTIMATE PLATELET CLUMPS NOTED ON SMEAR, UNABLE TO ESTIMATE   < > = values in this interval not displayed.   Cardiac Enzymes: No results for input(s): CKTOTAL, CKMB, CKMBINDEX, TROPONINI in the last 168 hours. CBG: Recent Labs  Lab 05/19/24 2339 05/20/24 0153 05/20/24 0415 05/20/24 0630 05/20/24 0835  GLUCAP 135* 135* 151* 134* 121*    Iron Studies: No results for input(s): IRON, TIBC, TRANSFERRIN, FERRITIN in the last 72 hours. Studies/Results: ECHOCARDIOGRAM COMPLETE Result Date: 05/19/2024    ECHOCARDIOGRAM REPORT   Patient Name:   INDRIA BISHARA Date of Exam: 05/19/2024 Medical Rec #:  [id number]        Height:       65.0 in Accession #:    [id number]       Weight:       235.9 lb Date of Birth:  1965/10/26         BSA:          2.122 m Patient Age:    58 years         BP:           148/73 mmHg Patient Gender: F                HR:           123 bpm. Exam Location:  Inpatient Procedure: 2D Echo (Both Spectral and Color Flow Doppler were utilized during            procedure). Indications:    elevated troponin  History:        Patient has prior history of Echocardiogram examinations, most                 recent 09/13/2018. CAD, Signs/Symptoms:Murmur; Risk                 Factors:Hypertension,  Diabetes and Dyslipidemia.  Sonographer:    Tinnie Barefoot RDCS Referring Phys: [phone] WHITNEY D HARRIS IMPRESSIONS  1. Peak LVOT gradient at rest 25 mmHG. Left ventricular ejection fraction, by estimation, is >75%. The left ventricle has hyperdynamic function. The left ventricle has no regional wall motion abnormalities. There is severe left ventricular hypertrophy. Left ventricular diastolic parameters are indeterminate. Elevated left atrial pressure. The E/e' is 16.  2. Right ventricular systolic function is normal. The right ventricular size is normal. There is normal pulmonary artery systolic pressure. The estimated right ventricular systolic pressure is 25.8 mmHg.  3. The mitral valve is degenerative. Mild mitral valve regurgitation. No evidence of mitral stenosis.  4. The aortic valve is tricuspid. Aortic valve regurgitation is not visualized. Aortic valve sclerosis is present, with no evidence of aortic valve stenosis.  5. The inferior vena cava is normal in size with greater than 50% respiratory variability, suggesting right atrial pressure of 3 mmHg. Comparison(s): A prior study was performed on 09/13/2018. LVEF 60-65%, mid cavitary gradient with peak gradient , Grade 2 diastolic dysfunction. FINDINGS  Left Ventricle: Peak LVOT gradient at rest 25 mmHG. Left ventricular ejection fraction, by estimation, is >75%. The left ventricle has hyperdynamic function. The left ventricle  has no regional wall motion abnormalities. The left ventricular internal cavity size was small. There is severe left ventricular hypertrophy. Left ventricular diastolic parameters are indeterminate. Elevated left atrial pressure. The E/e' is 16. Right Ventricle: The right ventricular size is normal. No increase in right ventricular wall thickness. Right ventricular systolic function is normal. There is normal pulmonary artery systolic pressure. The tricuspid regurgitant velocity is 2.39 m/s, and  with an assumed right atrial  pressure of 3 mmHg, the estimated right ventricular systolic pressure is 25.8 mmHg. Left Atrium: Left atrial size was normal in size. Right Atrium: Right atrial size was normal in size. Pericardium: There is no evidence of pericardial effusion. Mitral Valve: The mitral valve is degenerative in appearance. Mild mitral annular calcification. Mild mitral valve regurgitation. No evidence of mitral valve stenosis. Tricuspid Valve: The tricuspid valve is normal in structure. Tricuspid valve regurgitation is not demonstrated. No evidence of tricuspid stenosis. Aortic Valve: The aortic valve is tricuspid. Aortic valve regurgitation is not visualized. Aortic valve sclerosis is present, with no evidence of aortic valve stenosis. Aortic valve mean gradient measures 22.0 mmHg. Aortic valve peak gradient measures 42.5 mmHg. Pulmonic Valve: The pulmonic valve was normal in structure. Pulmonic valve regurgitation is not visualized. No evidence of pulmonic stenosis. Aorta: The aortic root and ascending aorta are structurally normal, with no evidence of dilitation. Venous: The inferior vena cava is normal in size with greater than 50% respiratory variability, suggesting right atrial pressure of 3 mmHg. IAS/Shunts: The atrial septum is grossly normal.  LEFT VENTRICLE PLAX 2D LVIDd:         3.70 cm   Diastology LVIDs:         2.20 cm   LV e' medial:    6.09 cm/s LV PW:         1.40 cm   LV E/e' medial:  17.4 LV IVS:        1.60 cm   LV e' lateral:   7.07 cm/s LVOT diam:     1.90 cm   LV E/e' lateral: 15.0 LVOT Area:     2.84 cm  RIGHT VENTRICLE             IVC RV Basal diam:  2.30 cm     IVC diam: 1.40 cm RV S prime:     15.30 cm/s TAPSE (M-mode): 1.6 cm LEFT ATRIUM             Index        RIGHT ATRIUM           Index LA diam:        3.30 cm 1.56 cm/m   RA Area:     10.70 cm LA Vol (A2C):   48.2 ml 22.72 ml/m  RA Volume:   23.10 ml  10.89 ml/m LA Vol (A4C):   51.3 ml 24.18 ml/m LA Biplane Vol: 53.3 ml 25.12 ml/m  AORTIC VALVE  AV Vmax:      326.00 cm/s AV Vmean:     217.000 cm/s AV VTI:       0.445 m AV Peak Grad: 42.5 mmHg AV Mean Grad: 22.0 mmHg  AORTA Ao Root diam: 3.20 cm Ao Asc diam:  3.30 cm MITRAL VALVE                TRICUSPID VALVE MV Area (PHT): 5.27 cm     TR Peak grad:   22.8 mmHg MV Decel Time: 144 msec     TR Vmax:  239.00 cm/s MV E velocity: 106.00 cm/s MV A velocity: 135.00 cm/s  SHUNTS MV E/A ratio:  0.79         Systemic Diam: 1.90 cm Sunit Tolia Electronically signed by Madonna Large Signature Date/Time: 05/19/2024/5:46:39 PM    Final    CT CHEST ABDOMEN PELVIS WO CONTRAST Result Date: 05/19/2024 CLINICAL DATA:  Sepsis EXAM: CT CHEST, ABDOMEN AND PELVIS WITHOUT CONTRAST TECHNIQUE: Multidetector CT imaging of the chest, abdomen and pelvis was performed following the standard protocol without IV contrast. RADIATION DOSE REDUCTION: This exam was performed according to the departmental dose-optimization program which includes automated exposure control, adjustment of the mA and/or kV according to patient size and/or use of iterative reconstruction technique. COMPARISON:  Chest x-ray from earlier in the same day. FINDINGS: CT CHEST FINDINGS Cardiovascular: Somewhat limited due to lack of IV contrast. Atherosclerotic calcifications are noted. No enlargement of the pulmonary artery is seen. No cardiac enlargement is seen. Mediastinum/Nodes: Thoracic inlet is within normal limits. No hilar or mediastinal adenopathy is noted. The esophagus is within normal limits. Lungs/Pleura: Lungs show bilateral lower lobe, left upper lobe and right middle lobe consolidation increased in the interval from the chest x-ray from hours previous. No sizable effusion is seen. Small 4 mm inflammatory nodule is noted in the left upper lobe best seen on image number 69 of series 5. Musculoskeletal: No chest wall mass or suspicious bone lesions identified. CT ABDOMEN PELVIS FINDINGS Hepatobiliary: No focal liver abnormality is seen. No  gallstones, gallbladder wall thickening, or biliary dilatation. Pancreas: Unremarkable. No pancreatic ductal dilatation or surrounding inflammatory changes. Spleen: Normal in size without focal abnormality. Adrenals/Urinary Tract: Adrenal glands are within normal limits. Kidneys demonstrate no renal calculi or obstructive changes. Ureters are within normal limits. Bladder is decompressed by Foley catheter. Stomach/Bowel: No obstructive or inflammatory changes of the colon are seen. The appendix is not discretely visualized. No inflammatory changes to suggest appendicitis are noted. Small bowel and stomach are within normal limits. Vascular/Lymphatic: Aortic atherosclerosis. No enlarged abdominal or pelvic lymph nodes. Reproductive: Uterine fibroid change is noted. No adnexal mass is seen. Other: No abdominal wall hernia or abnormality. No abdominopelvic ascites. Musculoskeletal: No acute or significant osseous findings. IMPRESSION: Extensive bilateral infiltrates increased when compared with the recent plain film. Small likely postinflammatory nodule in the left upper lobe. No specific follow-up is recommended. Uterine fibroid change. No other focal abnormality is seen. Aortic Atherosclerosis (ICD10-I70.0). Electronically Signed   By: Oneil Devonshire M.D.   On: 05/19/2024 00:54   DG Chest Port 1 View Result Date: 05/18/2024 CLINICAL DATA:  Hypoxia EXAM: PORTABLE CHEST 1 VIEW COMPARISON:  Film from earlier in the same day. FINDINGS: Cardiac shadow is stable. Left lung is clear. Increased consolidation is noted in the right middle lobe along the minor fissure. No sizable effusion is seen. IMPRESSION: Increasing right middle lobe consolidation. Electronically Signed   By: Oneil Devonshire M.D.   On: 05/18/2024 20:44   US  RENAL Result Date: 05/18/2024 CLINICAL DATA:  Acute kidney injury EXAM: RENAL / URINARY TRACT ULTRASOUND COMPLETE COMPARISON:  None Available. FINDINGS: Right Kidney: Renal measurements: 11.7 x 6.8 x  5.8 cm = volume: 237 mL. Normal echotexture. No hydronephrosis. Questionable complex cystic area laterally, not well visualized. Left Kidney: Renal measurements: 11.3 x 5.6 x 5.6 cm = volume: 186 mL. 1.8 cm cyst in the lower pole. Normal echotexture. No hydronephrosis. Bladder: Not visualized. Other: None. IMPRESSION: No acute findings.  No hydronephrosis. Question complex cystic area laterally  in the right kidney. This could be further evaluated with contrast-enhanced CT or MRI. Electronically Signed   By: Franky Crease M.D.   On: 05/18/2024 18:01    aspirin  EC  81 mg Oral Daily   Chlorhexidine Gluconate Cloth  6 each Topical Daily   clopidogrel   75 mg Oral Daily   rosuvastatin   10 mg Oral QPM    BMET    Component Value Date/Time   NA 132 (L) 05/20/2024 0647   NA 144 12/20/2018 0956   K 4.4 05/20/2024 0647   CL 98 05/20/2024 0647   CO2 16 (L) 05/20/2024 0647   GLUCOSE 146 (H) 05/20/2024 0647   BUN 46 (H) 05/20/2024 0647   BUN 13 12/20/2018 0956   CREATININE 7.26 (H) 05/20/2024 0647   CREATININE 1.05 (H) 08/10/2023 1437   CALCIUM  6.9 (L) 05/20/2024 0647   GFRNONAA 6 (L) 05/20/2024 0647   GFRAA 84 12/20/2018 0956   CBC    Component Value Date/Time   WBC 8.7 05/20/2024 0422   RBC 4.34 05/20/2024 0422   HGB 10.2 (L) 05/20/2024 0647   HCT 28.8 (L) 05/20/2024 0647   PLT PLATELET CLUMPS NOTED ON SMEAR, UNABLE TO ESTIMATE 05/20/2024 0647   MCV 68.0 (L) 05/20/2024 0422   MCH 24.2 (L) 05/20/2024 0422   MCHC 35.6 05/20/2024 0422   RDW 15.9 (H) 05/20/2024 0422   LYMPHSABS 2.4 05/18/2024 0331   MONOABS 0.5 05/18/2024 0331   EOSABS 0.0 05/18/2024 0331   BASOSABS 0.0 05/18/2024 0331    Assessment/Plan:  AKI - in setting of DKA.  UA with gluc, blood, and protein.  Negative nitrite and renal US  without obstruction.  Likely ischemic ATN in setting of volume depletion and acute illness as well as hypotension (her Bp dropped to 89/43 earlier yesterday).  Worsening work of breathing and no UOP  overnight.  Worsening metabolic acidosis and likely due to AKI and not DKA.  Discussed with pt and primary svc.  Will ask PCCM for HD catheter placement and initiate CRRT with volume removal.  Will continue to follow closely. Avoid nephrotoxic medications including NSAIDs and iodinated intravenous contrast exposure unless the latter is absolutely indicated.   Preferred narcotic agents for pain control are hydromorphone, fentanyl , and methadone. Morphine should not be used.  Avoid Baclofen and avoid oral sodium phosphate and magnesium citrate based laxatives / bowel preps.  Continue strict Input and Output monitoring.  Will monitor the patient closely with you and intervene or adjust therapy as indicated by changes in clinical status/labs  Severe sepsis with septic shock due to RML pneumonia - currently on Zosyn and doxycycline.  Given 1 dose of Vancomycin 2 grams upon presentation. Acute hypoxic respiratory failure - currently on nasal cannula 8 L.  Per PCCM Acute NSTEMI - on IV heparin  and being followed by Cardiology. Hyponatremia - due to DKA and AKI Hypokalemia - due to DKA. IMproved DKA - on insulin  drip per PCCM Abnormal LFT's - likely shock liver.   Thrombocytopenia - due to acute critical illness.  Continue to monitor. AGMA - due to #1 and #7.  Metformin  stopped.    Fairy RONAL Sellar, MD [facility]

## 2024-05-20 NOTE — Hospital Course (Addendum)
 Danielle Flores is a 58 year old **Jehovah's Witness** female with past medical history of CAD status post MI (09/2020) s/p 1 coronary stent, T2DM, HTN, HLD who presented to [facility] ED on 05/18/2024 with 3-day history of rhinorrhea, fever, congestion, cough, shortness of breath, generalized weakness, and 1 day history of N/V.  In the ED, the patient was febrile to a Tmax of 102.6 F, tachycardic to 132, tachypneic at 28, BP 121/73, SpO2 89% on RA that improved to 95% on 2 L nasal cannula.   However patient continued to have increased work of breathing as well as in mild distress and was placed on BiPAP.  CBC showed leukocytosis of 14.3.  CMP remarkable for sodium 126, calcium  2.9, bicarb 12, blood glucose 524, creatinine 2.96 (baseline 0.8-1), AST 233, ALT 62, anion gap 24. BhB 1.30. BNP wnl. Initial high sensitive troponin was elevated to 1322 and ultimately peaked at 2036. COVID/flu/RSV negative.  PCCM was consulted and patient was admitted to the ICU for management of DKA, elevated troponins, AKI, and shortness of breath.   Assessment & Plan:   Acute kidney injury due to septic ATN - Patient was initially on CRRT - Dialysis schedule per nephrology - [facility] planned after further improvement in leukocytosis; IR has been consulted  - Tentative plan for tunneled cath placement now changed to Monday per IR - please measure urine output  - sounds like maybe eventual pause in HD to see if renal fxn has returned but not planned for inpatient; patient may be leaving hospital while still having HD; tentative for CIR but if planned for outpt then needs CLIP initiated; deferred to nephrology  Microcytic anemia Anemia of chronic disease  - Likely combo of renal failure and iron deficiency -On Aranesp weekly, Sundays - venofer given 10/23 - Still combo of renal failure and HD filter; patient Jehovoah Witness and still declines PRBC. BP is stable  - Hemoglobin slightly down again, 6.8 g/dL this morning  Severe sepsis  due to PNA -resolved -Sepsis physiology has resolved. - Persistent leukocytosis.  WBC downtrending; will keep watching - Completed doxycycline and Zosyn courses - Stool studies negative.  Okay for Imodium   Acute respiratory failure with hypoxia in the setting of bilateral multifocal pneumonia - resolved - Remains on room air - Patient has completed course of Zosyn and doxy  - Patient was initially on CRRT (stopped on 05/22/2024).   DMII DKA - resolved  - Continue Semglee  and NovoLog  - A1c of 9.9%.   Hypervolemic hyponatremia - Likely volume related. - Continue to optimize volume with hemodialysis/ultrafiltration   Acute metabolic acidosis - Related to acute kidney injury   Demand cardiac ischemia in the setting of septic shock Shock liver, improving Anemia and thrombocytopenia critical illness

## 2024-05-20 NOTE — Progress Notes (Signed)
 PHARMACY - ANTICOAGULATION Pharmacy Consult:  Heparin  Indication: ACS/NSTEMI   Allergies  Allergen Reactions   Lisinopril  Hives, Itching and Swelling    Lip swelling, itching from head to toe, hives chest, breast, and abdomen.   Atorvastatin  Other (See Comments)    Myalgias   Red Blood Cells     Jehova's witness    Patient Measurements: Height: 5' 5 (165.1 cm) Weight: 107.3 kg (236 lb 8.9 oz) IBW/kg (Calculated) : 57 HEPARIN  DW (KG): 77.8  Vital Signs: Temp: 97.4 F (36.3 C) (10/14 1145) Temp Source: Axillary (10/14 1145) BP: 110/75 (10/14 1345) Pulse Rate: 119 (10/14 1358)  Labs: Recent Labs    05/18/24 0156 05/18/24 0331 05/18/24 1002 05/18/24 1218 05/18/24 1506 05/18/24 1731 05/18/24 1903 05/19/24 0318 05/19/24 0653 05/19/24 2319 05/20/24 0422 05/20/24 0647 05/20/24 1007  HGB 13.8   < >  --   --   --   --    < > 14.3  --   --  10.5* 10.2*  --   HCT 40.1   < >  --   --   --   --    < > 41.9  --   --  29.5* 28.8*  --   PLT 161   < >  --   --   --   --   --  135*  --   --  PLATELET CLUMPS NOTED ON SMEAR, UNABLE TO ESTIMATE PLATELET CLUMPS NOTED ON SMEAR, UNABLE TO ESTIMATE  --   LABPROT 15.8*  --   --   --   --   --   --   --   --   --   --   --   --   INR 1.2  --   --   --   --   --   --   --   --   --   --   --   --   HEPARINUNFRC  --   --   --   --   --  0.38  --  0.36  --   --  0.58  --   --   CREATININE 2.96*   < >  --    < > 3.78*  --    < > 4.96*   < > 6.82*  --  7.26* 7.43*  TROPONINIHS 1,322*   < > 1,781*  --  2,036* 1,254*  --   --   --   --   --   --   --    < > = values in this interval not displayed.    Estimated Creatinine Clearance: 9.8 mL/min (A) (by C-G formula based on SCr of 7.43 mg/dL (H)).   Assessment: 55 YOF presented with SOB and flu-like symptoms.  Found to have sepsis, DKA and NSTEMI.  Pharmacy consulted to dose IV heparin   Heparin  level 0.58 is therapeutic with heparin  running at 950 units/hr. Hgb (10.2) and PLTs (unable to be  calculated) are stable. Per RN, no report of pauses, issues with the line, or signs of bleeding. Echo was normal today, per cardiology okay to stop heparin  drip   Goal of Therapy:  Heparin  level 0.3-0.7 units/ml Monitor platelets by anticoagulation protocol: Yes   Plan:  Stop heparin  infusion  Thank you for allowing pharmacy to be a part of this patient's care.   Nidia Schaffer, PharmD PGY2 Cardiology Pharmacy Resident  Please check AMION for all [facility] Pharmacy phone numbers After 10:00  PM, call Main Pharmacy [phone] 05/20/2024  2:00 PM

## 2024-05-20 NOTE — Plan of Care (Signed)
  Problem: Fluid Volume: Goal: Ability to maintain a balanced intake and output will improve Outcome: Not Progressing   Problem: Metabolic: Goal: Ability to maintain appropriate glucose levels will improve Outcome: Progressing   Problem: Skin Integrity: Goal: Risk for impaired skin integrity will decrease Outcome: Progressing   Problem: Tissue Perfusion: Goal: Adequacy of tissue perfusion will improve Outcome: Progressing   Problem: Education: Goal: Knowledge of General Education information will improve Description: Including pain rating scale, medication(s)/side effects and non-pharmacologic comfort measures Outcome: Progressing   Problem: Clinical Measurements: Goal: Ability to maintain clinical measurements within normal limits will improve Outcome: Progressing Goal: Will remain free from infection Outcome: Progressing Goal: Diagnostic test results will improve Outcome: Not Progressing Goal: Respiratory complications will improve Outcome: Not Progressing Goal: Cardiovascular complication will be avoided Outcome: Progressing

## 2024-05-20 NOTE — Inpatient Diabetes Management (Addendum)
 Inpatient Diabetes Program Recommendations  AACE/ADA: New Consensus Statement on Inpatient Glycemic Control (2015)  Target Ranges:  Prepandial:   less than 140 mg/dL      Peak postprandial:   less than 180 mg/dL (1-2 hours)      Critically ill patients:  140 - 180 mg/dL   Lab Results  Component Value Date   GLUCAP 121 (H) 05/20/2024   HGBA1C 9.9 (H) 05/18/2024    Latest Reference Range & Units 05/20/24 06:47  Sodium 135 - 145 mmol/L 132 (L)  Potassium 3.5 - 5.1 mmol/L 4.4  Chloride 98 - 111 mmol/L 98  CO2 22 - 32 mmol/L 16 (L)  Glucose 70 - 99 mg/dL 853 (H)  BUN 6 - 20 mg/dL 46 (H)  Creatinine 9.55 - 1.00 mg/dL 2.73 (H)  Calcium  8.9 - 10.3 mg/dL 6.9 (L)  Anion gap 5 - 15  18 (H)  (L): Data is abnormally low (H): Data is abnormally high  Latest Reference Range & Units 05/20/24 06:47  Beta-Hydroxybutyric Acid 0.05 - 0.27 mmol/L 0.20    Diabetes history: DM2 Outpatient Diabetes medications:  70/30 26 units bid Metformin  500 mg bid Jardiance  25 daily (note states not taking) Current orders for Inpatient glycemic control: IV insulin    Inpatient Diabetes Program Recommendations:   CO2 16 and Anion gap 18. If patient is able to eat, please order diet as appropriate. May also consider add D10 to fluids until anion gap 15 and CO2 20. When ready to transfer to subcutaneous insulin , please consider: -Lantus  18 units bid (give first dose 2 hrs. Prior to D/C of insulin  drip -Novolog  0-9 units q 4 hrs. Correction (give first dose when IV insulin  discontinued -Add Novolog  meal coverage when eating.  Thank you, Zilla Shartzer E. Kinsleigh Ludolph, RN, MSN, CNS, CDCES  Diabetes Coordinator Inpatient Glycemic Control Team Team Pager [phone] (8am-5pm) 05/20/2024 8:59 AM

## 2024-05-20 NOTE — TOC Initial Note (Addendum)
 Transition of Care [facility]) - Initial/Assessment Note    Patient Details  Name: Danielle Flores MRN: [id number] Date of Birth: 1966/03/06  Transition of Care [facility]) CM/SW Contact:    Justina Delcia Czar, RN Phone Number: [phone] 05/20/2024, 7:25 PM  Clinical Narrative:                 Spoke to pt and husband. Pt gave permission to speak to spouse. 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   Pt works full-time. No FMLA/STD needed.   Will continue to follow for dc needs.   Expected Discharge Plan: Home/Self Care Barriers to Discharge: Continued Medical Work up   Patient Goals and CMS Choice   Expected Discharge Plan and Services   Discharge Planning Services: CM Consult   Living arrangements for the past 2 months: Single Family Home                    Prior Living Arrangements/Services Living arrangements for the past 2 months: Single Family Home Lives with:: Spouse Patient language and need for interpreter reviewed:: Yes Do you feel safe going back to the place where you live?: Yes      Need for Family Participation in Patient Care: No (Comment) Care giver support system in place?: Yes (comment)   Criminal Activity/Legal Involvement Pertinent to Current Situation/Hospitalization: No - Comment as needed  Activities of Daily Living   ADL Screening (condition at time of admission) Independently performs ADLs?: Yes (appropriate for developmental age) Is the patient deaf or have difficulty hearing?: No Does the patient have difficulty seeing, even when wearing glasses/contacts?: No Does the patient have difficulty concentrating, remembering, or making decisions?: No  Permission Sought/Granted Permission sought to share information with : Case Manager, Family Supports, PCP Permission granted to share information with : Yes, Verbal Permission Granted  Share Information with NAME: Ema Hebner  Permission granted to share info w AGENCY: PCP,  DME  Permission granted to share info w Relationship: husband  Permission granted to share info w Contact Information: [phone]  Emotional Assessment Appearance:: Appears stated age Attitude/Demeanor/Rapport: Engaged Affect (typically observed): Accepting Orientation: : Oriented to Self, Oriented to Place, Oriented to  Time, Oriented to Situation   Psych Involvement: No (comment)  Admission diagnosis:  DKA (diabetic ketoacidosis) (HCC) [E11.10] NSTEMI (non-ST elevated myocardial infarction) (HCC) [I21.4] AKI (acute kidney injury) [N17.9] Diabetic ketoacidosis without coma associated with type 2 diabetes mellitus (HCC) [E11.10] Sepsis with acute renal failure, due to unspecified organism, unspecified acute renal failure type, unspecified whether septic shock present (HCC) [A41.9, R65.20, N17.9] Patient Active Problem List   Diagnosis Date Noted   Sepsis with acute renal failure (HCC) 05/19/2024   AKI (acute kidney injury) 05/19/2024   Positive ANA (antinuclear antibody) 05/02/2021   Lupus anticoagulant positive 05/02/2021   Pain in left wrist 02/20/2021   Pain in right wrist 02/20/2021   Pain in joint, multiple sites 01/14/2021   Hyperlipidemia associated with type 2 diabetes mellitus (HCC) 01/14/2021   Uncontrolled type 2 diabetes mellitus with hyperglycemia (HCC) 01/14/2021   CAD in native artery/Stent 09/16/2018 09/16/2018   DKA, type 2 (HCC) 09/13/2018   NSTEMI (non-ST elevated myocardial infarction) (HCC) 09/13/2018   DKA (diabetic ketoacidosis) (HCC) 09/13/2018   Elevated troponin    DM (diabetes mellitus) type II uncontrolled, periph vascular disorder 08/06/2017   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   GENERALIZED ANXIETY DISORDER 10/13/2010   CARDIAC MURMUR 10/13/2010  History of cardiovascular disorder 02/28/2008   ELBOW PAIN, RIGHT 11/01/2007   Hyperlipidemia LDL goal <100 10/14/2007   HYPERGLYCEMIA 10/14/2007   SICKLE CELL TRAIT 01/24/2007    Essential hypertension 01/24/2007   POSTPROCEDURAL STATUS NEC 01/24/2007   PCP:  Antonio Cyndee Jamee JONELLE, DO Pharmacy:   [facility] [address], [place] - [address] [place] [address] Phone: [phone] Fax: [phone]     Social Drivers of Health (SDOH) Social History: SDOH Screenings   Food Insecurity: No Food Insecurity (05/19/2024)  Housing: Low Risk  (05/19/2024)  Transportation Needs: No Transportation Needs (05/19/2024)  Utilities: Not At Risk (05/19/2024)  Alcohol Screen: Low Risk  (02/22/2024)  Depression (PHQ2-9): Low Risk  (02/25/2024)  Financial Resource Strain: Low Risk  (02/22/2024)  Physical Activity: Insufficiently Active (02/22/2024)  Social Connections: Moderately Integrated (02/22/2024)  Stress: No Stress Concern Present (02/22/2024)  Tobacco Use: Low Risk  (02/25/2024)   SDOH Interventions:     Readmission Risk Interventions     No data to display

## 2024-05-20 NOTE — Progress Notes (Signed)
 Echo reviewed. Patient with LVH and vigorous LV function. This is reassuring as regards her elevated troponin which is due to demand from her acute illness, ARF  No further cardiac evaluation needed. Will sign off  [facility] will sign off.   Medication Recommendations:  per [facility] Other recommendations (labs, testing, etc):  none  Follow up as an outpatient:  as per usual  Raynell Upton [place] MD, FACC

## 2024-05-20 NOTE — Progress Notes (Signed)
 NAME:  Danielle Flores, MRN:  [id number], DOB:  05/10/1966, LOS: 2 ADMISSION DATE:  05/18/2024, CONSULTATION DATE:  05/18/2024 REFERRING MD:  April Palumbo, MD, CHIEF COMPLAINT:  SOB  History of Present Illness:  58 y/o female with PMH for DMT2 on insulin , HTN, HLD, CAD s/p MI 09/2018, S/p 1 coronary stent who presents with worsening flu like symptoms.  She feels she has a the flu.  She has not had a swab or definitive testing but felt like she had the flu.  She c/o N/V x 3 days and no diarrhea, she had generalized weakness.  She has been taking her insulin .  She says she has been getting worse rather than better. In the ED she was found to have NSTEMI with increased Toponin 1,322, BG on chenistry 524, K+ 2.9, WBC 14.3, NA 126, Bicarb 12, AG 24.  Covid, flu and RSSV negative.  Pertinent  Medical History  DMT2 on insulin , HTN, HLD, CAD s/p MI 09/2018, S/p 1 coronary stent  **Jehova's witness**no blood transfusions**  Significant Hospital Events: Including procedures, antibiotic start and stop dates in addition to other pertinent events   10/12: NSTEMI and DKA, requiring application of BiPAP in ED 10/13 patient spiked fever with Tmax of 103, came off of BiPAP on [place] oxygen, remained on insulin  infusion  Interim History / Subjective:  Patient became hypoxic overnight requiring BiPAP Currently on 5 L nasal cannula oxygen Remain anuric Spiked fever again with Tmax 101  Objective    Blood pressure (!) 145/67, pulse (!) 119, temperature 98.8 F (37.1 C), temperature source Oral, resp. rate (!) 27, height 5' 5 (1.651 m), weight 107.3 kg, SpO2 95%.    FiO2 (%):  [40 %-50 %] 40 % PEEP:  [6 cmH20] 6 cmH20 Pressure Support:  [10 cmH20] 10 cmH20   Intake/Output Summary (Last 24 hours) at 05/20/2024 1114 Last data filed at 05/20/2024 1000 Gross per 24 hour  Intake 2665.15 ml  Output 33 ml  Net 2632.15 ml   Filed Weights   05/18/24 0305 05/19/24 0500 05/20/24 0400  Weight: 93 kg 107 kg  107.3 kg    Examination: General: Acutely ill-appearing female, lying on the bed HEENT: [place]/AT, eyes anicteric.  moist mucus membranes Neuro: Alert, awake following commands Chest: Bilateral faint basal crackles, no wheezes or rhonchi Heart: Tachycardic, regular rhythm, no murmurs or gallops Abdomen: Soft, nontender, nondistended, bowel sounds present  Labs and images reviewed  Patient Lines/Drains/Airways Status     Active Line/Drains/Airways     Name Placement date Placement time Site Days   Peripheral IV 05/18/24 20 G Anterior;Proximal;Right Forearm 05/18/24  0330  Forearm  2   Peripheral IV 05/18/24 22 G Anterior;Left Forearm 05/18/24  0330  Forearm  2   Urethral Catheter [name] RN Double-lumen 16 Fr. 05/18/24  1636  Double-lumen  2         Resolved problem list  Hypokalemia/hypomagnesemia/hypophosphatemia/hyponatremia  Assessment and Plan  Diabetes type 2 presented with DKA Patient hemoglobin A1c is 9.9 Continue to require insulin  infusion as blood sugars are still not well-controlled IV fluid is off now considering she looks volume overloaded Anion gap is still elevated now likely due to AKI  Severe sepsis with septic shock due to right middle lobe pneumonia, POA Acute respiratory failure with hypoxia Patient still septic with acute respiratory failure Spiked fever overnight with Tmax 101 X-ray chest showing worsening right middle lobe pneumonia Continue IV Zosyn and doxycycline Overnight required BiPAP, currently on 5 L  nasal cannula oxygen Cultures have been negative so far Respiratory pathogen panel is negative Continue to titrate with O2 sat goal 92% Lactate has cleared Off vasopressor support  Demand cardiac ischemia Patient denies any chest pain Troponins were elevated Echocardiogram showed no wall motion abnormalities with normal function, likely this troponin elevation was due to demand cardiac ischemia from severe sepsis IV heparin  infusion  was stopped Continue aspirin  and Plavix   Acute Kidney Injury due to ATN, anuric Hypervolemic hyponatremia/hypocalcemia Patient remained anuric despite high-dose Lasix therapy Serum creatinine continues to trend up Nephrology is following, patient will be started on CRRT  Shock liver Repeat LFTs tomorrow Avoid hepatotoxic agents  Thrombocytopenia critical illness LFTs are improving, closely monitor  Jehova's witness-- forms that she gave to RN added below       The patient is critically ill due to.  Sepsis with septic shock.  Critical care was necessary to treat or prevent imminent or life-threatening deterioration.  Critical care was time spent personally by me on the following activities: development of treatment plan with patient and/or surrogate as well as nursing, discussions with consultants, evaluation of patient's response to treatment, examination of patient, obtaining history from patient or surrogate, ordering and performing treatments and interventions, ordering and review of laboratory studies, ordering and review of radiographic studies, pulse oximetry, re-evaluation of patient's condition and participation in multidisciplinary rounds.   During this encounter critical care time was devoted to patient care services described in this note for 42 minutes.     [name], MD [facility] See Amion for pager If no response to pager, please call [phone] until 7pm After 7pm, Please call E-link [phone]

## 2024-05-20 NOTE — Progress Notes (Signed)
 Progress Note   Patient: Danielle Flores FMW:[id number] DOB: 07-14-1966 DOA: 05/18/2024     2 DOS: the patient was seen and examined on 05/20/2024   Brief hospital course: 57 year old **Jehovah's Witness** female with past medical history of CAD status post MI (09/2020) s/p 1 coronary stent, T2DM, HTN, HLD who presented to [facility] ED on 05/18/2024 with 3-day history of rhinorrhea, fever, congestion, cough, shortness of breath, generalized weakness, and 1 day history of N/V.  In the ED, the patient was febrile to a Tmax of 102.6 F, tachycardic to 132, tachypneic at 28, BP 121/73, SpO2 89% on RA that improved to 95% on 2 L nasal cannula.  However patient continued to have increased work of breathing as well as in mild distress and was placed on BiPAP.  CBC showed leukocytosis of 14.3.  CMP remarkable for sodium 126, calcium  2.9, bicarb 12, blood glucose 524, creatinine 2.96 (baseline 0.8-1), AST 233, ALT 62, anion gap 24. BhB 1.30. BNP wnl. Initial high sensitive troponin was elevated to 1322 and ultimately peaked at 2036. COVID/flu/RSV negative.  PCCM was consulted and patient was admitted to the ICU for management of DKA, elevated troponins, AKI, and shortness of breath.  Update: The patient was downgraded to progressive on 05/19/2024. However, after conferring with nephrology this morning, the patient will need CRRT therapy and it is Dr. Alida preference that she remains in the unit. PCCM team to resume care as primary team.   Assessment and Plan:  # Severe sepsis with septic shock due to right middle lobe pneumonia, POA # Acute hypoxic respiratory failure - On initial presentation, SpO2 89% on room air, improved to 95% on 2 L [place].  Ultimately required BiPAP due to increased work of breathing and was admitted to the ICU - Required BiPAP overnight due to increased work of breathing. Now back down to 3L/min [place] with SpO2 100%. However, continues to be tachypneic.  Suspect her hypoxia, tachypnea,  increased work of breathing are related to the underlying pneumonia, hypervolemia in the setting of anuria, and further driven by compensation for her metabolic acidosis - CT chest showed extensive bilateral infiltrates increased when compared with recent plain film - Febrile overnight to 101.6 F - Blood cultures (10/12) x2 - no growth to date - Blood culture (10/13) - no growth to date - Continue IV Zosyn and IV doxycycline - Wean O2 as tolerated  #AKI with anuria - Creatinine elevated 2.96 on admission.  Baseline 0.8-1.0 - Renal US  (10/12) without acute findings or hydronephrosis - Nephrology following - indicating likely ischemic ATN in setting of volume depletion, acute illness, and hypotension. No urgent need for HD.  - Creatinine continued to worsen, up to 7.26 today - 24hr UOP - 33 ml - Suspect her acute renal failure is the main driver of her metabolic acidosis at this time, less so the DKA. - Discussed with Dr. Rayburn - patient will need CRRT due to worsening acute renal failure, anuria overnight, and ongoing metabolic acidosis  # DKA # T2DM - Bicarb 16, AG 18. However, BhB has been wnl for a while - Suspect the true cause of her metabolic acidosis is entirely related to her renal failure and not DKA, for which she may very well need RRT - Continues to have a high insulin  requirement, discuss with PharmD and Diabetes coordinator for best SubQ regimen for transitioning off insulin  drip  # High anion gap metabolic acidosis - At this time, likely largely due to AKI, with less  contribution from DKA than on initial presentation  #Thrombocytopenia - Likely secondary to sepsis and acute illness - Issues with platelet clumping on labs today, unable to quantify  #Anemia - Hgb dropped to 10.2 today from 14.3 yesterday - No overt signs or symptoms of bleeding - Suspect an element of hemodilution given the patient received 3,000 mL of fluid yesterday with only 33 mL of UOP -  Continue to monitor - Heparin  drip has been stopped anyway  # Hypokalemia -- resolved - Secondary to DKA  # Hyponatremia - Secondary to DKA and AKI - Monitoring with treatment for above - Nephrology following  #Type II MI - Troponins elevated on admission, peaked at 2036 - Was initially started on heparin  drip due to concern for NSTEMI - Cardiology following - elevated troponins thought to be type II MI in the setting of sepsis, DKA, and ARF. Indicated if echo is normal, no further cardiac workup is indicated and heparin  drip can be discontinued - Echo (10/13) showed LVEF >75%, no regional wall motion abnormalities, severe LV hypertrophy, elevated atrial pressure, normal RV function, normal PASP, mild MR.   - Discontinued heparin  drip  #Elevated LFTs - LFTs elevated on admission, suspect secondary to septic shock - Repeat LFTs to monitor for improvement     Subjective: Patient with increased work of breathing and placed on BiPAP overnight, tolerated well.  Came off BiPAP this morning down to 3 L nasal cannula with SpO2 100%.  Reports feeling well this morning despite everything else going on.  Okay with placement of temporary HD catheter to undergo CRRT.  Denies chest pain, abdominal pain, nausea, vomiting, confusion.  Physical Exam: Vitals:   05/20/24 0615 05/20/24 0630 05/20/24 0645 05/20/24 0700  BP: 127/70 106/75 (!) 125/59 132/79  Pulse: (!) 110 (!) 112 (!) 111 (!) 112  Resp: (!) 23 (!) 28 (!) 23 (!) 28  Temp:      TempSrc:      SpO2: 99% 99% 100% 100%  Weight:      Height:       Gen: NAD, [place] in place CVS: tachycardic, RRR, no murmurs Resp: tachypniec, some increased WOB, CTAB anteriorly Abd: NTND Ext: warm, well-perfused, no appreciable LE edema Neuro: AAOx3, pleasant    Family Communication: Discussed with patient only  Disposition: Status is: Inpatient Remains inpatient appropriate because: continued need for IV antibiotics, O2 requirement, need for CRRT  therapy  Planned Discharge Destination: TBD    Time spent: 75 minutes  Author: Duffy Larch, MD 05/20/2024 7:41 AM  For on call review [email or web address].

## 2024-05-20 NOTE — Progress Notes (Signed)
 Initial Nutrition Assessment  DOCUMENTATION CODES:   Not applicable  INTERVENTION:   Liberalize diet  Ensure Plus High Protein po BID, each supplement provides 350 kcal and 20 grams of protein  Add Renal MVI daily  NUTRITION DIAGNOSIS:   Increased nutrient needs related to acute illness as evidenced by estimated needs.  GOAL:   Patient will meet greater than or equal to 90% of their needs  MONITOR:   PO intake, Supplement acceptance, I & O's, Labs, Weight trends  REASON FOR ASSESSMENT:   Consult  Assessment of nutrition requirement/status (CRRT)  ASSESSMENT:   59 yo female admitted with severe sepsis with pneumonia, acute respiratory failure, anuric AKI with acidosis, poorly controlled DM. PMH includes HTN, HLD, CAD s/p MI with stent x 1, DM2 on insulin .  10/12 Admitted NSTEMI, sepsis 10/13 BiPap, Febrile Tmax 103 10/14 CRRT initiated  Pt alert but appears confused/altered. Pt reports she was admitted July 3rd and has been here ever since. Pt even shows RD her right arm, stating see I got this when I fell but no nothing noted. Unable to obtain diet and weight history at this time  No recorded po intake  +anuric AKI with initiation of CRRT today +acidosis, initially thought to be +DKA but now AKI thought to be main driver  Pt reports UBW around 189 pounds, current wt 237 pounds ( 107.3 kg)  BiPap overnight. Currently on 5L [place], Sats reading 100% but pt appears dyspneic with talking.   +multiple large type 7 stools in last 24 hours, green/black/brown in color; described as frothy, mucous per RN  Poorly controlled DM, currently controlled well on insulin  drip but high insulin  requirements Lab Results  Component Value Date   HGBA1C 9.9 (H) 05/18/2024     Labs: BUN 46 Creatinine 7.26 Phosphorus 3.0 (wdl) on 10/12 Magnesium 2.4 (wdl) on 10/12 CBGs 121-213 (goal 140-180)  Meds: Insulin  drip   NUTRITION - FOCUSED PHYSICAL EXAM:  Flowsheet Row Most  Recent Value  Orbital Region No depletion  Upper Arm Region Unable to assess  Thoracic and Lumbar Region Unable to assess  Buccal Region No depletion  Temple Region No depletion  Clavicle Bone Region Unable to assess  Clavicle and Acromion Bone Region Unable to assess  Scapular Bone Region Unable to assess  Dorsal Hand Unable to assess  Patellar Region Unable to assess  Anterior Thigh Region Unable to assess  Posterior Calf Region Unable to assess  Edema (RD Assessment) Moderate    Diet Order:   Diet Order             Diet heart healthy/carb modified Room service appropriate? Yes; Fluid consistency: Thin  Diet effective now                   EDUCATION NEEDS: `  Not appropriate for education at this time  Skin:  Skin Assessment: Reviewed RN Assessment  Last BM:  10/13  type 7  large green/brown, mucousy (frothy)  Height:   Ht Readings from Last 1 Encounters:  05/18/24 5' 5 (1.651 m)    Weight:   Wt Readings from Last 1 Encounters:  05/20/24 107.3 kg     BMI:  Body mass index is 39.36 kg/m.  Estimated Nutritional Needs:   Kcal:  1700-1900 kcals  Protein:  100-125 g  Fluid:  1L plus UOP   Betsey Finger MS, RDN, LDN, CNSC Registered Dietitian 3 Clinical Nutrition RD Inpatient Contact Info in Amion

## 2024-05-21 DIAGNOSIS — A419 Sepsis, unspecified organism: Secondary | ICD-10-CM | POA: Diagnosis not present

## 2024-05-21 DIAGNOSIS — R6521 Severe sepsis with septic shock: Secondary | ICD-10-CM | POA: Diagnosis not present

## 2024-05-21 DIAGNOSIS — E1110 Type 2 diabetes mellitus with ketoacidosis without coma: Secondary | ICD-10-CM | POA: Diagnosis not present

## 2024-05-21 DIAGNOSIS — J9601 Acute respiratory failure with hypoxia: Secondary | ICD-10-CM | POA: Diagnosis not present

## 2024-05-21 LAB — RENAL FUNCTION PANEL
Albumin: 1.9 g/dL — ABNORMAL LOW (ref 3.5–5.0)
Albumin: 1.9 g/dL — ABNORMAL LOW (ref 3.5–5.0)
Anion gap: 16 — ABNORMAL HIGH (ref 5–15)
Anion gap: 14 (ref 5–15)
BUN: 40 mg/dL — ABNORMAL HIGH (ref 6–20)
BUN: 33 mg/dL — ABNORMAL HIGH (ref 6–20)
CO2: 18 mmol/L — ABNORMAL LOW (ref 22–32)
CO2: 20 mmol/L — ABNORMAL LOW (ref 22–32)
Calcium: 7.6 mg/dL — ABNORMAL LOW (ref 8.9–10.3)
Calcium: 7.7 mg/dL — ABNORMAL LOW (ref 8.9–10.3)
Chloride: 100 mmol/L (ref 98–111)
Chloride: 97 mmol/L — ABNORMAL LOW (ref 98–111)
Creatinine, Ser: 4.91 mg/dL — ABNORMAL HIGH (ref 0.44–1.00)
Creatinine, Ser: 3.58 mg/dL — ABNORMAL HIGH (ref 0.44–1.00)
GFR, Estimated: 10 mL/min — ABNORMAL LOW (ref >60)
GFR, Estimated: 14 mL/min — ABNORMAL LOW (ref >60)
Glucose, Bld: 116 mg/dL — ABNORMAL HIGH (ref 70–99)
Glucose, Bld: 257 mg/dL — ABNORMAL HIGH (ref 70–99)
Phosphorus: 3.1 mg/dL (ref 2.5–4.6)
Phosphorus: 2.8 mg/dL (ref 2.5–4.6)
Potassium: 4.5 mmol/L (ref 3.5–5.1)
Potassium: 4.8 mmol/L (ref 3.5–5.1)
Sodium: 134 mmol/L — ABNORMAL LOW (ref 135–145)
Sodium: 131 mmol/L — ABNORMAL LOW (ref 135–145)

## 2024-05-21 LAB — BASIC METABOLIC PANEL WITH GFR
Anion gap: 19 — ABNORMAL HIGH (ref 5–15)
Anion gap: 15 (ref 5–15)
Anion gap: 16 — ABNORMAL HIGH (ref 5–15)
Anion gap: 16 — ABNORMAL HIGH (ref 5–15)
BUN: 37 mg/dL — ABNORMAL HIGH (ref 6–20)
BUN: 34 mg/dL — ABNORMAL HIGH (ref 6–20)
BUN: 33 mg/dL — ABNORMAL HIGH (ref 6–20)
BUN: 34 mg/dL — ABNORMAL HIGH (ref 6–20)
CO2: 17 mmol/L — ABNORMAL LOW (ref 22–32)
CO2: 19 mmol/L — ABNORMAL LOW (ref 22–32)
CO2: 20 mmol/L — ABNORMAL LOW (ref 22–32)
CO2: 20 mmol/L — ABNORMAL LOW (ref 22–32)
Calcium: 7.4 mg/dL — ABNORMAL LOW (ref 8.9–10.3)
Calcium: 7.5 mg/dL — ABNORMAL LOW (ref 8.9–10.3)
Calcium: 7.4 mg/dL — ABNORMAL LOW (ref 8.9–10.3)
Calcium: 7.6 mg/dL — ABNORMAL LOW (ref 8.9–10.3)
Chloride: 96 mmol/L — ABNORMAL LOW (ref 98–111)
Chloride: 99 mmol/L (ref 98–111)
Chloride: 98 mmol/L (ref 98–111)
Chloride: 97 mmol/L — ABNORMAL LOW (ref 98–111)
Creatinine, Ser: 4.46 mg/dL — ABNORMAL HIGH (ref 0.44–1.00)
Creatinine, Ser: 4.16 mg/dL — ABNORMAL HIGH (ref 0.44–1.00)
Creatinine, Ser: 3.85 mg/dL — ABNORMAL HIGH (ref 0.44–1.00)
Creatinine, Ser: 3.38 mg/dL — ABNORMAL HIGH (ref 0.44–1.00)
GFR, Estimated: 11 mL/min — ABNORMAL LOW (ref >60)
GFR, Estimated: 12 mL/min — ABNORMAL LOW (ref >60)
GFR, Estimated: 13 mL/min — ABNORMAL LOW (ref >60)
GFR, Estimated: 15 mL/min — ABNORMAL LOW (ref >60)
Glucose, Bld: 115 mg/dL — ABNORMAL HIGH (ref 70–99)
Glucose, Bld: 162 mg/dL — ABNORMAL HIGH (ref 70–99)
Glucose, Bld: 145 mg/dL — ABNORMAL HIGH (ref 70–99)
Glucose, Bld: 256 mg/dL — ABNORMAL HIGH (ref 70–99)
Potassium: 4.7 mmol/L (ref 3.5–5.1)
Potassium: 4.5 mmol/L (ref 3.5–5.1)
Potassium: 4.6 mmol/L (ref 3.5–5.1)
Potassium: 4.8 mmol/L (ref 3.5–5.1)
Sodium: 132 mmol/L — ABNORMAL LOW (ref 135–145)
Sodium: 133 mmol/L — ABNORMAL LOW (ref 135–145)
Sodium: 134 mmol/L — ABNORMAL LOW (ref 135–145)
Sodium: 133 mmol/L — ABNORMAL LOW (ref 135–145)

## 2024-05-21 LAB — HEPATIC FUNCTION PANEL
ALT: 192 U/L — ABNORMAL HIGH (ref 0–44)
AST: 1128 U/L — ABNORMAL HIGH (ref 15–41)
Albumin: 1.7 g/dL — ABNORMAL LOW (ref 3.5–5.0)
Alkaline Phosphatase: 123 U/L (ref 38–126)
Bilirubin, Direct: 0.4 mg/dL — ABNORMAL HIGH (ref 0.0–0.2)
Indirect Bilirubin: 1 mg/dL — ABNORMAL HIGH (ref 0.3–0.9)
Total Bilirubin: 1.4 mg/dL — ABNORMAL HIGH (ref 0.0–1.2)
Total Protein: 5.6 g/dL — ABNORMAL LOW (ref 6.5–8.1)

## 2024-05-21 LAB — CBC
HCT: 25.2 % — ABNORMAL LOW (ref 36.0–46.0)
Hemoglobin: 9 g/dL — ABNORMAL LOW (ref 12.0–15.0)
MCH: 24 pg — ABNORMAL LOW (ref 26.0–34.0)
MCHC: 35.7 g/dL (ref 30.0–36.0)
MCV: 67.2 fL — ABNORMAL LOW (ref 80.0–100.0)
Platelets: 99 K/uL — ABNORMAL LOW (ref 150–400)
RBC: 3.75 MIL/uL — ABNORMAL LOW (ref 3.87–5.11)
RDW: 15.4 % (ref 11.5–15.5)
WBC: 10.4 K/uL (ref 4.0–10.5)
nRBC: 0 % (ref 0.0–0.2)

## 2024-05-21 LAB — MAGNESIUM: Magnesium: 2.6 mg/dL — ABNORMAL HIGH (ref 1.7–2.4)

## 2024-05-21 LAB — GLUCOSE, CAPILLARY
Glucose-Capillary: 104 mg/dL — ABNORMAL HIGH (ref 70–99)
Glucose-Capillary: 110 mg/dL — ABNORMAL HIGH (ref 70–99)
Glucose-Capillary: 117 mg/dL — ABNORMAL HIGH (ref 70–99)
Glucose-Capillary: 112 mg/dL — ABNORMAL HIGH (ref 70–99)
Glucose-Capillary: 113 mg/dL — ABNORMAL HIGH (ref 70–99)
Glucose-Capillary: 115 mg/dL — ABNORMAL HIGH (ref 70–99)
Glucose-Capillary: 132 mg/dL — ABNORMAL HIGH (ref 70–99)
Glucose-Capillary: 142 mg/dL — ABNORMAL HIGH (ref 70–99)
Glucose-Capillary: 158 mg/dL — ABNORMAL HIGH (ref 70–99)
Glucose-Capillary: 150 mg/dL — ABNORMAL HIGH (ref 70–99)
Glucose-Capillary: 260 mg/dL — ABNORMAL HIGH (ref 70–99)
Glucose-Capillary: 266 mg/dL — ABNORMAL HIGH (ref 70–99)

## 2024-05-21 MED ORDER — INSULIN ASPART 100 UNIT/ML IJ SOLN
0.0000 [IU] | Freq: Every day | INTRAMUSCULAR | Status: DC
  Administered 2024-05-21: 3 [IU] via SUBCUTANEOUS
  Administered 2024-05-22: 8 [IU] via SUBCUTANEOUS
  Administered 2024-05-23 – 2024-05-24 (×2): 3 [IU] via SUBCUTANEOUS
  Administered 2024-05-31: 2 [IU] via SUBCUTANEOUS

## 2024-05-21 MED ORDER — EZETIMIBE 10 MG PO TABS
10.0000 mg | ORAL_TABLET | Freq: Every day | ORAL | Status: DC
  Administered 2024-05-21 – 2024-06-04 (×14): 10 mg via ORAL
  Filled 2024-05-21 (×14): qty 1

## 2024-05-21 MED ORDER — INSULIN ASPART 100 UNIT/ML IJ SOLN
0.0000 [IU] | Freq: Three times a day (TID) | INTRAMUSCULAR | Status: DC
  Administered 2024-05-21 – 2024-05-22 (×3): 8 [IU] via SUBCUTANEOUS
  Administered 2024-05-22: 5 [IU] via SUBCUTANEOUS
  Administered 2024-05-23: 11 [IU] via SUBCUTANEOUS
  Administered 2024-05-23: 8 [IU] via SUBCUTANEOUS
  Administered 2024-05-23: 11 [IU] via SUBCUTANEOUS
  Administered 2024-05-24: 3 [IU] via SUBCUTANEOUS
  Administered 2024-05-24: 5 [IU] via SUBCUTANEOUS
  Administered 2024-05-25: 2 [IU] via SUBCUTANEOUS
  Administered 2024-05-25: 3 [IU] via SUBCUTANEOUS
  Administered 2024-05-25: 5 [IU] via SUBCUTANEOUS
  Administered 2024-05-26 (×2): 3 [IU] via SUBCUTANEOUS
  Administered 2024-05-26 – 2024-05-27 (×2): 5 [IU] via SUBCUTANEOUS
  Administered 2024-05-27: 2 [IU] via SUBCUTANEOUS
  Administered 2024-05-28: 3 [IU] via SUBCUTANEOUS
  Administered 2024-05-28: 8 [IU] via SUBCUTANEOUS
  Administered 2024-05-29: 2 [IU] via SUBCUTANEOUS
  Administered 2024-05-29: 5 [IU] via SUBCUTANEOUS
  Administered 2024-05-30: 2 [IU] via SUBCUTANEOUS
  Administered 2024-05-30: 3 [IU] via SUBCUTANEOUS
  Administered 2024-05-30: 2 [IU] via SUBCUTANEOUS
  Administered 2024-05-31: 3 [IU] via SUBCUTANEOUS
  Administered 2024-05-31 – 2024-06-01 (×3): 2 [IU] via SUBCUTANEOUS
  Administered 2024-06-02: 5 [IU] via SUBCUTANEOUS
  Administered 2024-06-02 – 2024-06-04 (×5): 2 [IU] via SUBCUTANEOUS
  Filled 2024-05-21: qty 2

## 2024-05-21 MED ORDER — GERHARDT'S BUTT CREAM
TOPICAL_CREAM | Freq: Every day | CUTANEOUS | Status: DC
  Administered 2024-05-21 – 2024-05-23 (×3): 1 via TOPICAL
  Filled 2024-05-21 (×4): qty 60

## 2024-05-21 MED ORDER — INSULIN GLARGINE-YFGN 100 UNIT/ML ~~LOC~~ SOLN
10.0000 [IU] | Freq: Every day | SUBCUTANEOUS | Status: DC
  Administered 2024-05-21 – 2024-05-22 (×2): 10 [IU] via SUBCUTANEOUS
  Filled 2024-05-21 (×2): qty 0.1

## 2024-05-21 MED ORDER — OXIDIZED CELLULOSE EX PADS
1.0000 | MEDICATED_PAD | Freq: Once | CUTANEOUS | Status: AC
  Administered 2024-05-21: 1 via TOPICAL
  Filled 2024-05-21: qty 1

## 2024-05-21 NOTE — Evaluation (Signed)
 Physical Therapy Evaluation Patient Details Name: Danielle Flores MRN: [id number] DOB: 03-14-1966 Today's Date: 05/21/2024  History of Present Illness  Pt is 58 yo presenting to [facility] on 10/12 due to fever, rhinorrhea, congestion, SOB progressing over the past 3 days. Also with nausea and vomiting for 2 days.  PT found to be in acute respiratory failure with hypoxia in setting of bil multifocal pneumonia and severe sepsis with septic shock; complicated by AKI due to septic NSTEMI and DKA. Pt on CRRT.  PMH: DM II, hyperlipidemia, CAD.  Clinical Impression  Pt is currently presenting at Min A +2 for bed mobility, sit to stand and step pivot transfers. Pt requires support for balance and LE strength in standing due to balance and strength deficits. Two person assist due to lines/leads. Pt expected to progress well. Family and friends can help at home. Due to pt current functional status, home set up and available assistance at home no recommended skilled physical therapy services at this time on discharge from acute care hospital setting. Will continue to follow in acute setting in order to ensure that pt returns home with decreased risk for falls, injury, re-hospitalization and improved activity tolerance.          If plan is discharge home, recommend the following: Assist for transportation;Assistance with cooking/housework;Help with stairs or ramp for entrance     Equipment Recommendations None recommended by PT     Functional Status Assessment Patient has had a recent decline in their functional status and demonstrates the ability to make significant improvements in function in a reasonable and predictable amount of time.     Precautions / Restrictions Precautions Precautions: Fall Recall of Precautions/Restrictions: Intact Precaution/Restrictions Comments: CRRT Restrictions Weight Bearing Restrictions Per Provider Order: No      Mobility  Bed Mobility Overal bed mobility: Needs  Assistance Bed Mobility: Supine to Sit, Rolling     Supine to sit: Min assist, +2 for safety/equipment     General bed mobility comments: +2 for lines, Min A for assist with rolling R/L to get off bed pan and for clean up. Cues for body mechanics to decrease need for assist. MIn A for trunk to midline with verbal cues for safe hand placement to prevent pulling on lines and body mechanics    Transfers Overall transfer level: Needs assistance Equipment used: None Transfers: Sit to/from Stand, Bed to chair/wheelchair/BSC Sit to Stand: Min assist, +2 safety/equipment   Step pivot transfers: Min assist, +2 safety/equipment       General transfer comment: face to face assist with assist at pelvis to get to standing MIn A with second person to assist with lines and for safety. Pt with min A for wgt shifting to step toward recliner and initiate movement.    Ambulation/Gait     Pre-gait activities: pt took steps from EOB to recliner at [facility] A without an AD and UE support on physical therapist.        Balance Overall balance assessment: Needs assistance Sitting-balance support: Bilateral upper extremity supported, Feet supported Sitting balance-Leahy Scale: Fair     Standing balance support: Bilateral upper extremity supported, During functional activity Standing balance-Leahy Scale: Poor Standing balance comment: Min A for balance in standing with bil UE suport on therapist       Pertinent Vitals/Pain Pain Assessment Pain Assessment: Faces Faces Pain Scale: Hurts a little bit Pain Location: chest with coughing Pain Descriptors / Indicators: Aching, Grimacing Pain Intervention(s): Limited activity within patient's tolerance, Monitored  during session, Repositioned    Home Living Family/patient expects to be discharged to:: Private residence Living Arrangements: Spouse/significant other Available Help at Discharge: Family;Available 24 hours/day;Friend(s) Type of Home:  House Home Access: Stairs to enter Entrance Stairs-Rails: None Entrance Stairs-Number of Steps: 3   Home Layout: One level Home Equipment: None      Prior Function Prior Level of Function : Independent/Modified Independent;Working/employed;Driving             Mobility Comments: very ind prior to hospitalization 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     Extremity/Trunk Assessment   Upper Extremity Assessment Upper Extremity Assessment: Generalized weakness    Lower Extremity Assessment Lower Extremity Assessment: Generalized weakness    Cervical / Trunk Assessment Cervical / Trunk Assessment: Normal  Communication   Communication Communication: No apparent difficulties    Cognition Arousal: Alert Behavior During Therapy: WFL for tasks assessed/performed   PT - Cognitive impairments: No apparent impairments   Following commands: Intact       Cueing Cueing Techniques: Verbal cues     General Comments General comments (skin integrity, edema, etc.): O2 sats and HR remained WNL Throughout session.        Assessment/Plan    PT Assessment Patient needs continued PT services  PT Problem List Decreased strength;Decreased activity tolerance;Decreased balance;Decreased mobility;Decreased safety awareness       PT Treatment Interventions DME instruction;Balance training;Gait training;Stair training;Functional mobility training;Therapeutic activities;Therapeutic exercise;Patient/family education    PT Goals (Current goals can be found in the Care Plan section)  Acute Rehab PT Goals Patient Stated Goal: to return to home and work PT Goal Formulation: With patient Time For Goal Achievement: 06/04/24 Potential to Achieve Goals: Good    Frequency Min 2X/week        AM-PAC PT 6 Clicks Mobility  Outcome Measure Help needed turning from your back to your side while in a flat bed without using bedrails?: A Little Help needed moving  from lying on your back to sitting on the side of a flat bed without using bedrails?: A Little Help needed moving to and from a bed to a chair (including a wheelchair)?: A Little Help needed standing up from a chair using your arms (e.g., wheelchair or bedside chair)?: A Little Help needed to walk in hospital room?: A Lot Help needed climbing 3-5 steps with a railing? : A Lot 6 Click Score: 16    End of Session Equipment Utilized During Treatment: Gait belt;Oxygen Activity Tolerance: Patient tolerated treatment well Patient left: in chair;with call bell/phone within reach;with family/visitor present Nurse Communication: Mobility status PT Visit Diagnosis: Unsteadiness on feet (R26.81);Other abnormalities of gait and mobility (R26.89);Muscle weakness (generalized) (M62.81)    Time: 8547-8483 PT Time Calculation (min) (ACUTE ONLY): 24 min   Charges:   PT Evaluation $PT Eval Low Complexity: 1 Low PT Treatments $Therapeutic Activity: 8-22 mins PT General Charges $$ ACUTE PT VISIT: 1 Visit    Dorothyann Maier, DPT, CLT  Acute Rehabilitation Services Office: [phone] (Secure chat preferred)   Dorothyann VEAR Maier 05/21/2024, 3:25 PM

## 2024-05-21 NOTE — Plan of Care (Signed)
  Problem: Fluid Volume: Goal: Ability to maintain a balanced intake and output will improve Outcome: Progressing   Problem: Education: Goal: Ability to describe self-care measures that may prevent or decrease complications (Diabetes Survival Skills Education) will improve Outcome: Progressing Goal: Individualized Educational Video(s) Outcome: Progressing   Problem: Health Behavior/Discharge Planning: Goal: Ability to identify and utilize available resources and services will improve Outcome: Progressing Goal: Ability to manage health-related needs will improve Outcome: Progressing

## 2024-05-21 NOTE — Plan of Care (Signed)

## 2024-05-21 NOTE — Procedures (Signed)
 I was present at this session of CRRT. I have reviewed the session itself and made appropriate changes.   Vital signs in last 24 hours:  Temp:  [97 F (36.1 C)-99.4 F (37.4 C)] 99.4 F (37.4 C) (10/15 0753) Pulse Rate:  [100-129] 113 (10/15 0845) Resp:  [16-57] 30 (10/15 0845) BP: (103-153)/(53-121) 119/59 (10/15 0845) SpO2:  [89 %-100 %] 95 % (10/15 0845) Weight:  [103.4 kg-104.9 kg] 103.4 kg (10/15 0645) Weight change: -2.4 kg Filed Weights   05/20/24 0400 05/21/24 0345 05/21/24 0645  Weight: 107.3 kg 104.9 kg 103.4 kg    Recent Labs  Lab 05/21/24 0215 05/21/24 0555  NA 134* 132*  K 4.5 4.7  CL 100 96*  CO2 18* 17*  GLUCOSE 116* 115*  BUN 40* 37*  CREATININE 4.91* 4.46*  CALCIUM  7.6* 7.4*  PHOS 3.1  --     Recent Labs  Lab 05/18/24 0156 05/18/24 0331 05/18/24 1001 05/19/24 0318 05/20/24 0422 05/20/24 0647 05/21/24 0555  WBC 14.3* 13.4*   < > 12.5* 8.7  --  10.4  NEUTROABS 10.9* 10.5*  --   --   --   --   --   HGB 13.8 13.8   < > 14.3 10.5* 10.2* 9.0*  HCT 40.1 40.8   < > 41.9 29.5* 28.8* 25.2*  MCV 69.5* 70.1*   < > 68.5* 68.0*  --  67.2*  PLT 161 127*   < > 135* PLATELET CLUMPS NOTED ON SMEAR, UNABLE TO ESTIMATE PLATELET CLUMPS NOTED ON SMEAR, UNABLE TO ESTIMATE 99*   < > = values in this interval not displayed.    Scheduled Meds:  (feeding supplement) PROSource Plus  30 mL Oral BID BM   Chlorhexidine Gluconate Cloth  6 each Topical Daily   clopidogrel   75 mg Oral Daily   doxycycline  100 mg Oral Q12H   feeding supplement  237 mL Oral BID BM   heparin  injection (subcutaneous)  5,000 Units Subcutaneous Q8H   multivitamin  1 tablet Oral QHS   rosuvastatin   10 mg Oral QPM   Continuous Infusions:  sodium chloride  Stopped (05/21/24 0724)   insulin  1 Units/hr (05/21/24 0800)   piperacillin-tazobactam (ZOSYN)  IV 12.5 mL/hr at 05/21/24 0800   prismasol BGK 4/2.5 400 mL/hr at 05/21/24 0133   prismasol BGK 4/2.5 400 mL/hr at 05/21/24 0138   prismasol BGK  4/2.5 1,500 mL/hr at 05/21/24 0232   PRN Meds:.sodium chloride , acetaminophen, dextrose , docusate sodium, heparin , heparin , ondansetron  (ZOFRAN ) IV, mouth rinse    Assessment/Plan:  AKI - in setting of DKA.  UA with gluc, blood, and protein.  Negative nitrite and renal US  without obstruction.  Likely ischemic ATN in setting of volume depletion and acute illness as well as hypotension (her Bp dropped to 89/43 earlier yesterday).  Worsening work of breathing and no UOP overnight.  Worsening metabolic acidosis and likely due to AKI and not DKA.  Temp RIJ HD catheter placed by PCCM on 05/20/24 and CRRT initiated.  All fluids 4K/2.5Ca.  goal UF 50-150 mL/hr.  Able to UF 3.7 liters overnight with improved respiratory status.  No improvement of UOP at this time.  Will continue to follow closely and hopefully can transition to IHD in the next 24-48 hours. Avoid nephrotoxic medications including NSAIDs and iodinated intravenous contrast exposure unless the latter is absolutely indicated.   Preferred narcotic agents for pain control are hydromorphone, fentanyl , and methadone. Morphine should not be used.  Avoid Baclofen and avoid oral sodium  phosphate and magnesium citrate based laxatives / bowel preps.  Continue strict Input and Output monitoring.  Will monitor the patient closely with you and intervene or adjust therapy as indicated by changes in clinical status/labs  Severe sepsis with septic shock due to RML pneumonia - currently on Zosyn and doxycycline.  Given 1 dose of Vancomycin 2 grams upon presentation. Acute hypoxic respiratory failure - currently on nasal cannula 8 L.  Per PCCM Acute NSTEMI - on IV heparin  and being followed by Cardiology. Hyponatremia - due to DKA and AKI Hypokalemia - due to DKA. IMproved DKA - off insulin  drip per PCCM Abnormal LFT's - likely shock liver.   Thrombocytopenia - due to acute critical illness.  Continue to monitor. AGMA - due to #1 and #7.  Metformin  stopped.   Improving with CRRT  Anemia - likely due to critical illness and AKI.  Will continue to follow and may need to start ESA as she will not accept blood transfusions.   Danielle DELENA Sellar,  MD 05/21/2024, 8:53 AM

## 2024-05-21 NOTE — Progress Notes (Signed)
 NAME:  Danielle Flores, MRN:  [id number], DOB:  04/10/1966, LOS: 3 ADMISSION DATE:  05/18/2024, CONSULTATION DATE:  05/18/2024 REFERRING MD:  April Palumbo, MD, CHIEF COMPLAINT:  SOB  History of Present Illness:  58 y/o female with PMH for DMT2 on insulin , HTN, HLD, CAD s/p MI 09/2018, S/p 1 coronary stent who presents with worsening flu like symptoms.  She feels she has a the flu.  She has not had a swab or definitive testing but felt like she had the flu.  She c/o N/V x 3 days and no diarrhea, she had generalized weakness.  She has been taking her insulin .  She says she has been getting worse rather than better. In the ED she was found to have NSTEMI with increased Toponin 1,322, BG on chenistry 524, K+ 2.9, WBC 14.3, NA 126, Bicarb 12, AG 24.  Covid, flu and RSSV negative.  Pertinent  Medical History  DMT2 on insulin , HTN, HLD, CAD s/p MI 09/2018, S/p 1 coronary stent  **Jehova's witness**no blood transfusions**  Significant Hospital Events: Including procedures, antibiotic start and stop dates in addition to other pertinent events   10/12: NSTEMI and DKA, requiring application of BiPAP in ED 10/13 patient spiked fever with Tmax of 103, came off of BiPAP on salter oxygen, remained on insulin  infusion 10/15 Fever curve improved, no culture growth, remains on CRRT  Interim History / Subjective:  No overnight events, 2L removed with CRRT, fever curve improved Remains on Maribel then asked to go back on Bipap for comfort  No other complaints  Still having some diarrhea   Objective    Blood pressure 117/61, pulse (!) 109, temperature 99.4 F (37.4 C), temperature source Axillary, resp. rate (!) 30, height 5' 5 (1.651 m), weight 103.4 kg, SpO2 95%.    Vent Mode: PCV;BIPAP FiO2 (%):  [40 %] 40 % Set Rate:  [15 bmp] 15 bmp PEEP:  [5 cmH20] 5 cmH20   Intake/Output Summary (Last 24 hours) at 05/21/2024 1107 Last data filed at 05/21/2024 1100 Gross per 24 hour  Intake 1755.5 ml  Output  4723.2 ml  Net -2967.7 ml   Filed Weights   05/20/24 0400 05/21/24 0345 05/21/24 0645  Weight: 107.3 kg 104.9 kg 103.4 kg    General:  ill-appearing F, resting in bed in NAD HEENT: MM pink/moist, sclera anicteric Neuro: fatigued, but alert and oriented and moving all extremities CV: s1s2 rrr, soft systolic murmur /r/g PULM:  mildly diminished in the bilateral bases  GI: soft, bsx4 active  Extremities: warm/dry, 1+ peripheral edema     Labs and images reviewed  Patient Lines/Drains/Airways Status     Active Line/Drains/Airways     Name Placement date Placement time Site Days   Peripheral IV 05/18/24 20 G Anterior;Proximal;Right Forearm 05/18/24  0330  Forearm  2   Peripheral IV 05/18/24 22 G Anterior;Left Forearm 05/18/24  0330  Forearm  2   Urethral Catheter [name] RN Double-lumen 16 Fr. 05/18/24  1636  Double-lumen  2         Resolved problem list  Hypokalemia/hypomagnesemia/hypophosphatemia/hyponatremia  Assessment and Plan     Diabetes type 2 presented with DKA -Patient hemoglobin A1c is 9.9 -Insulin  requirement in gtt is minimal and BHOB normalized yesterday -Anion gap is still elevated now likely due to AKI -Transition to long acting and SSI  Severe sepsis with septic shock due to right middle lobe pneumonia, POA Acute respiratory failure with hypoxia -CXR 10/14 showing worsening right middle lobe  pneumonia, Continue IV Zosyn and doxycycline -Bipap at bedtime and prn -follow cultures to completion  -Continue to titrate with O2 sat goal 92% -no pressor requirement   Demand cardiac ischemia -Echocardiogram showed no wall motion abnormalities with normal function, likely this troponin elevation was due to demand cardiac ischemia from severe sepsis -IV heparin  infusion was stopped -Continue aspirin  and Plavix   Acute Kidney Injury due to ATN, anuric Hypervolemic hyponatremia/hypocalcemia -started on CRRT 10/14, 2L off -remains  anuric -nephrology following and plan to continue CRRT today and then trial iHD tomorrow  Shock liver -LFT's pending -Avoid hepatotoxic agents  Thrombocytopenia critical illness -Platelets 99, closely monitor -Jehovah's witness, no transfusions     CRITICAL CARE Performed by: Leita SAUNDERS Dene Landsberg   Total critical care time: 40 minutes  Critical care time was exclusive of separately billable procedures and treating other patients.  Critical care was necessary to treat or prevent imminent or life-threatening deterioration.  Critical care was time spent personally by me on the following activities: development of treatment plan with patient and/or surrogate as well as nursing, discussions with consultants, evaluation of patient's response to treatment, examination of patient, obtaining history from patient or surrogate, ordering and performing treatments and interventions, ordering and review of laboratory studies, ordering and review of radiographic studies, pulse oximetry and re-evaluation of patient's condition.   Leita SAUNDERS Daimian Sudberry, PA-C [facility] See Amion for pager If no response to pager , please call [phone] until 7pm After 7:00 pm call Elink  [phone]

## 2024-05-22 ENCOUNTER — Inpatient Hospital Stay (HOSPITAL_COMMUNITY)

## 2024-05-22 DIAGNOSIS — R918 Other nonspecific abnormal finding of lung field: Secondary | ICD-10-CM | POA: Diagnosis not present

## 2024-05-22 DIAGNOSIS — I700 Atherosclerosis of aorta: Secondary | ICD-10-CM | POA: Diagnosis not present

## 2024-05-22 DIAGNOSIS — E1110 Type 2 diabetes mellitus with ketoacidosis without coma: Secondary | ICD-10-CM | POA: Diagnosis not present

## 2024-05-22 DIAGNOSIS — R6521 Severe sepsis with septic shock: Secondary | ICD-10-CM | POA: Diagnosis not present

## 2024-05-22 DIAGNOSIS — Z452 Encounter for adjustment and management of vascular access device: Secondary | ICD-10-CM | POA: Diagnosis not present

## 2024-05-22 DIAGNOSIS — J9601 Acute respiratory failure with hypoxia: Secondary | ICD-10-CM | POA: Diagnosis not present

## 2024-05-22 DIAGNOSIS — R0989 Other specified symptoms and signs involving the circulatory and respiratory systems: Secondary | ICD-10-CM | POA: Diagnosis not present

## 2024-05-22 DIAGNOSIS — A419 Sepsis, unspecified organism: Secondary | ICD-10-CM | POA: Diagnosis not present

## 2024-05-22 LAB — COMPREHENSIVE METABOLIC PANEL WITH GFR
ALT: 204 U/L — ABNORMAL HIGH (ref 0–44)
AST: 997 U/L — ABNORMAL HIGH (ref 15–41)
Albumin: 1.9 g/dL — ABNORMAL LOW (ref 3.5–5.0)
Alkaline Phosphatase: 166 U/L — ABNORMAL HIGH (ref 38–126)
Anion gap: 15 (ref 5–15)
BUN: 29 mg/dL — ABNORMAL HIGH (ref 6–20)
CO2: 20 mmol/L — ABNORMAL LOW (ref 22–32)
Calcium: 7.9 mg/dL — ABNORMAL LOW (ref 8.9–10.3)
Chloride: 98 mmol/L (ref 98–111)
Creatinine, Ser: 2.83 mg/dL — ABNORMAL HIGH (ref 0.44–1.00)
GFR, Estimated: 19 mL/min — ABNORMAL LOW (ref >60)
Glucose, Bld: 273 mg/dL — ABNORMAL HIGH (ref 70–99)
Potassium: 4.9 mmol/L (ref 3.5–5.1)
Sodium: 133 mmol/L — ABNORMAL LOW (ref 135–145)
Total Bilirubin: 1 mg/dL (ref 0.0–1.2)
Total Protein: 6.1 g/dL — ABNORMAL LOW (ref 6.5–8.1)

## 2024-05-22 LAB — RENAL FUNCTION PANEL
Albumin: 1.8 g/dL — ABNORMAL LOW (ref 3.5–5.0)
Albumin: 1.8 g/dL — ABNORMAL LOW (ref 3.5–5.0)
Anion gap: 13 (ref 5–15)
Anion gap: 17 — ABNORMAL HIGH (ref 5–15)
BUN: 30 mg/dL — ABNORMAL HIGH (ref 6–20)
BUN: 36 mg/dL — ABNORMAL HIGH (ref 6–20)
CO2: 22 mmol/L (ref 22–32)
CO2: 22 mmol/L (ref 22–32)
Calcium: 7.8 mg/dL — ABNORMAL LOW (ref 8.9–10.3)
Calcium: 7.8 mg/dL — ABNORMAL LOW (ref 8.9–10.3)
Chloride: 99 mmol/L (ref 98–111)
Chloride: 96 mmol/L — ABNORMAL LOW (ref 98–111)
Creatinine, Ser: 3.06 mg/dL — ABNORMAL HIGH (ref 0.44–1.00)
Creatinine, Ser: 3.31 mg/dL — ABNORMAL HIGH (ref 0.44–1.00)
GFR, Estimated: 17 mL/min — ABNORMAL LOW (ref >60)
GFR, Estimated: 15 mL/min — ABNORMAL LOW (ref >60)
Glucose, Bld: 217 mg/dL — ABNORMAL HIGH (ref 70–99)
Glucose, Bld: 260 mg/dL — ABNORMAL HIGH (ref 70–99)
Phosphorus: 2.5 mg/dL (ref 2.5–4.6)
Phosphorus: 3.1 mg/dL (ref 2.5–4.6)
Potassium: 4.7 mmol/L (ref 3.5–5.1)
Potassium: 5 mmol/L (ref 3.5–5.1)
Sodium: 134 mmol/L — ABNORMAL LOW (ref 135–145)
Sodium: 135 mmol/L (ref 135–145)

## 2024-05-22 LAB — GLUCOSE, CAPILLARY
Glucose-Capillary: 230 mg/dL — ABNORMAL HIGH (ref 70–99)
Glucose-Capillary: 280 mg/dL — ABNORMAL HIGH (ref 70–99)
Glucose-Capillary: 268 mg/dL — ABNORMAL HIGH (ref 70–99)
Glucose-Capillary: 292 mg/dL — ABNORMAL HIGH (ref 70–99)

## 2024-05-22 LAB — CBC
HCT: 25.7 % — ABNORMAL LOW (ref 36.0–46.0)
Hemoglobin: 9 g/dL — ABNORMAL LOW (ref 12.0–15.0)
MCH: 23.8 pg — ABNORMAL LOW (ref 26.0–34.0)
MCHC: 35 g/dL (ref 30.0–36.0)
MCV: 68 fL — ABNORMAL LOW (ref 80.0–100.0)
Platelets: 107 K/uL — ABNORMAL LOW (ref 150–400)
RBC: 3.78 MIL/uL — ABNORMAL LOW (ref 3.87–5.11)
RDW: 15.2 % (ref 11.5–15.5)
WBC: 11.2 K/uL — ABNORMAL HIGH (ref 4.0–10.5)
nRBC: 0 % (ref 0.0–0.2)

## 2024-05-22 LAB — BASIC METABOLIC PANEL WITH GFR
Anion gap: 14 (ref 5–15)
BUN: 29 mg/dL — ABNORMAL HIGH (ref 6–20)
CO2: 20 mmol/L — ABNORMAL LOW (ref 22–32)
Calcium: 8 mg/dL — ABNORMAL LOW (ref 8.9–10.3)
Chloride: 98 mmol/L (ref 98–111)
Creatinine, Ser: 2.92 mg/dL — ABNORMAL HIGH (ref 0.44–1.00)
GFR, Estimated: 18 mL/min — ABNORMAL LOW (ref >60)
Glucose, Bld: 231 mg/dL — ABNORMAL HIGH (ref 70–99)
Potassium: 4.8 mmol/L (ref 3.5–5.1)
Sodium: 132 mmol/L — ABNORMAL LOW (ref 135–145)

## 2024-05-22 LAB — MAGNESIUM: Magnesium: 2.8 mg/dL — ABNORMAL HIGH (ref 1.7–2.4)

## 2024-05-22 MED ORDER — INSULIN GLARGINE-YFGN 100 UNIT/ML ~~LOC~~ SOLN
20.0000 [IU] | Freq: Every day | SUBCUTANEOUS | Status: DC
  Administered 2024-05-23: 20 [IU] via SUBCUTANEOUS
  Filled 2024-05-22: qty 0.2

## 2024-05-22 MED ORDER — INSULIN GLARGINE-YFGN 100 UNIT/ML ~~LOC~~ SOLN
10.0000 [IU] | Freq: Once | SUBCUTANEOUS | Status: AC
  Administered 2024-05-22: 10 [IU] via SUBCUTANEOUS
  Filled 2024-05-22: qty 0.1

## 2024-05-22 MED ORDER — ATORVASTATIN CALCIUM 40 MG PO TABS: 40.0000 mg | ORAL_TABLET | Freq: Every day | ORAL | Status: DC

## 2024-05-22 NOTE — Procedures (Signed)
 I was present at this session of CRRT. I have reviewed the session itself and made appropriate changes.   Vital signs in last 24 hours:  Temp:  [97.5 F (36.4 C)-99 F (37.2 C)] 97.5 F (36.4 C) (10/16 0400) Pulse Rate:  [104-122] 113 (10/16 0800) Resp:  [23-46] 42 (10/16 0800) BP: (93-143)/(45-87) 119/66 (10/16 0800) SpO2:  [82 %-98 %] 94 % (10/16 0800) FiO2 (%):  [40 %] 40 % (10/15 1011) Weight:  [96.4 kg] 96.4 kg (10/16 0500) Weight change: -8.5 kg Filed Weights   05/21/24 0345 05/21/24 0645 05/22/24 0500  Weight: 104.9 kg 103.4 kg 96.4 kg    Recent Labs  Lab 05/22/24 0220 05/22/24 0615  NA 134* 132*  K 4.7 4.8  CL 99 98  CO2 22 20*  GLUCOSE 217* 231*  BUN 30* 29*  CREATININE 3.06* 2.92*  CALCIUM  7.8* 8.0*  PHOS 2.5  --     Recent Labs  Lab 05/18/24 0156 05/18/24 0331 05/18/24 1001 05/20/24 0422 05/20/24 0647 05/21/24 0555 05/22/24 0615  WBC 14.3* 13.4*   < > 8.7  --  10.4 11.2*  NEUTROABS 10.9* 10.5*  --   --   --   --   --   HGB 13.8 13.8   < > 10.5* 10.2* 9.0* 9.0*  HCT 40.1 40.8   < > 29.5* 28.8* 25.2* 25.7*  MCV 69.5* 70.1*   < > 68.0*  --  67.2* 68.0*  PLT 161 127*   < > PLATELET CLUMPS NOTED ON SMEAR, UNABLE TO ESTIMATE PLATELET CLUMPS NOTED ON SMEAR, UNABLE TO ESTIMATE 99* 107*   < > = values in this interval not displayed.    Scheduled Meds:  (feeding supplement) PROSource Plus  30 mL Oral BID BM   Chlorhexidine Gluconate Cloth  6 each Topical Daily   clopidogrel   75 mg Oral Daily   doxycycline  100 mg Oral Q12H   ezetimibe   10 mg Oral Daily   feeding supplement  237 mL Oral BID BM   Gerhardt's butt cream   Topical Daily   heparin  injection (subcutaneous)  5,000 Units Subcutaneous Q8H   insulin  aspart  0-15 Units Subcutaneous TID WC   insulin  aspart  0-5 Units Subcutaneous QHS   insulin  glargine-yfgn  10 Units Subcutaneous Daily   multivitamin  1 tablet Oral QHS   rosuvastatin   10 mg Oral QPM   Continuous Infusions:   piperacillin-tazobactam (ZOSYN)  IV 12.5 mL/hr at 05/22/24 0800   prismasol BGK 4/2.5 400 mL/hr at 05/22/24 0318   prismasol BGK 4/2.5 400 mL/hr at 05/22/24 0318   prismasol BGK 4/2.5 1,500 mL/hr at 05/22/24 0555   PRN Meds:.acetaminophen, dextrose , docusate sodium, heparin , heparin , ondansetron  (ZOFRAN ) IV, mouth rinse    Assessment/Plan:  AKI - in setting of DKA.  UA with gluc, blood, and protein.  Negative nitrite and renal US  without obstruction.  Likely ischemic ATN in setting of volume depletion and acute illness as well as hypotension (her Bp dropped to 89/43 earlier yesterday).  Worsening work of breathing and no UOP overnight.  Worsening metabolic acidosis and likely due to AKI and not DKA.  Temp RIJ HD catheter placed by PCCM on 05/20/24 and CRRT initiated.  All fluids 4K/2.5Ca.  goal UF 50-150 mL/hr.  Able to UF 3.7 liters overnight with improved respiratory status.  No improvement of UOP at this time.  Will continue to follow closely and hopefully can transition to IHD in the next 24-48 hours.  Will stop CRRT  when the filter clots today. Avoid nephrotoxic medications including NSAIDs and iodinated intravenous contrast exposure unless the latter is absolutely indicated.   Preferred narcotic agents for pain control are hydromorphone, fentanyl , and methadone. Morphine should not be used.  Avoid Baclofen and avoid oral sodium phosphate and magnesium citrate based laxatives / bowel preps.  Continue strict Input and Output monitoring.  Will monitor the patient closely with you and intervene or adjust therapy as indicated by changes in clinical status/labs  Severe sepsis with septic shock due to RML pneumonia - currently on Zosyn and doxycycline.  Given 1 dose of Vancomycin 2 grams upon presentation. Acute hypoxic respiratory failure - currently on nasal cannula 8 L.  Per PCCM Acute NSTEMI - on IV heparin  and being followed by Cardiology. Hyponatremia - due to DKA and AKI Hypokalemia - due to  DKA. IMproved DKA - off insulin  drip per PCCM Abnormal LFT's - likely shock liver.   Thrombocytopenia - due to acute critical illness.  Continue to monitor. AGMA - due to #1 and #7.  Metformin  stopped.  Improving with CRRT  Anemia - likely due to critical illness and AKI.  Will continue to follow and may need to start ESA as she will not accept blood transfusions.   Fairy DELENA Sellar,  MD 05/22/2024, 9:04 AM

## 2024-05-22 NOTE — Plan of Care (Signed)

## 2024-05-22 NOTE — Evaluation (Signed)
 Occupational Therapy Evaluation Patient Details Name: Danielle Flores MRN: [id number] DOB: 10-24-1965 Today's Date: 05/22/2024   History of Present Illness   Pt is 58 yo presenting to [facility] on 10/12 due to fever, rhinorrhea, congestion, SOB progressing over the past 3 days. Also with nausea and vomiting for 2 days.  PT found to be in acute respiratory failure with hypoxia in setting of bil multifocal pneumonia and severe sepsis with septic shock; complicated by AKI due to septic NSTEMI and DKA. Pt on CRRT.  PMH: DM II, hyperlipidemia, CAD.     Clinical Impressions PTA, pt lives with spouse, typically completely Independent and works as a Engineer, structural. Pt presents now with deficits endurance and strength. Pt endorses significant fatigue s/p BSC transfer so OT eval limited this AM as pt declined standing/mobility assessments with ADLs. Pt requires Setup-Mod A for ADLs based on functional assessments. Educated pt/spouse on AROM of BUE exercises, incentive spirometer use and initiated energy conservation education. Plan to finalize OT DC recs in next session when OOB ADLs able to be assessed.     If plan is discharge home, recommend the following:   A lot of help with bathing/dressing/bathroom;Assistance with cooking/housework     Functional Status Assessment   Patient has had a recent decline in their functional status and demonstrates the ability to make significant improvements in function in a reasonable and predictable amount of time.     Equipment Recommendations   Other (comment) (TBD pending progress)     Recommendations for Other Services         Precautions/Restrictions   Precautions Precautions: Fall Recall of Precautions/Restrictions: Intact Precaution/Restrictions Comments: CRRT Restrictions Weight Bearing Restrictions Per Provider Order: No     Mobility Bed Mobility               General bed mobility comments: in recliner on entry. Max A  x 2 to scoot  up in chair    Transfers                   General transfer comment: pt declined d/t fatigue      Balance Overall balance assessment: Needs assistance Sitting-balance support: No upper extremity supported, Feet supported Sitting balance-Leahy Scale: Fair                                     ADL either performed or assessed with clinical judgement   ADL Overall ADL's : Needs assistance/impaired Eating/Feeding: Set up;Sitting Eating/Feeding Details (indicate cue type and reason): per spouse, pt previously so weak, she was unable to feed self but able to do so now Grooming: Set up;Sitting   Upper Body Bathing: Moderate assistance;Sitting   Lower Body Bathing: Moderate assistance;Sitting/lateral leans   Upper Body Dressing : Moderate assistance;Sitting   Lower Body Dressing: Moderate assistance;Sitting/lateral leans                 General ADL Comments: Limited by fatigue post BSC transfer this AM with nursing so declined standing/mobility attempts within CRRT lines as planned. Emphasis on AROM exercises for BUE, incentive spirometer use and discussion of energy conservation strategies with pacing and hopes for continued activity later on in the day after resting.     Vision Ability to See in Adequate Light: 0 Adequate Patient Visual Report: No change from baseline Vision Assessment?: No apparent visual deficits     Perception  Praxis         Pertinent Vitals/Pain Pain Assessment Pain Assessment: No/denies pain     Extremity/Trunk Assessment Upper Extremity Assessment Upper Extremity Assessment: Generalized weakness;Right hand dominant   Lower Extremity Assessment Lower Extremity Assessment: Defer to PT evaluation   Cervical / Trunk Assessment Cervical / Trunk Assessment: Normal   Communication Communication Communication: No apparent difficulties   Cognition Arousal: Lethargic, Alert Behavior During Therapy: WFL for  tasks assessed/performed Cognition: No apparent impairments             OT - Cognition Comments: reports significant fatigue from recent [facility] use                 Following commands: Intact       Cueing  General Comments   Cueing Techniques: Verbal cues  Spouse at bedside   Exercises Exercises: Other exercises Other Exercises Other Exercises: AROM BUE shoulder flex x 5 Other Exercises: incentive spirometer   Shoulder Instructions      Home Living Family/patient expects to be discharged to:: Private residence Living Arrangements: Spouse/significant other Available Help at Discharge: Family;Available 24 hours/day;Friend(s) Type of Home: House Home Access: Stairs to enter [employer] of Steps: 3 Entrance Stairs-Rails: None Home Layout: One level     Bathroom Shower/Tub: Chief Strategy Officer: Standard Bathroom Accessibility: No   Home Equipment: None          Prior Functioning/Environment Prior Level of Function : Independent/Modified Independent;Working/employed;Driving             Mobility Comments: very ind prior to hospitalization 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, indep with ADLs/IADLs, likes to go to the beach with family    OT Problem List: Decreased strength;Decreased activity tolerance   OT Treatment/Interventions: Self-care/ADL training;Therapeutic exercise;Energy conservation;DME and/or AE instruction;Therapeutic activities;Patient/family education;Balance training      OT Goals(Current goals can be found in the care plan section)   Acute Rehab OT Goals Patient Stated Goal: get strength and energy back OT Goal Formulation: With patient/family Time For Goal Achievement: 06/05/24 Potential to Achieve Goals: Good   OT Frequency:  Min 2X/week    Co-evaluation              AM-PAC OT 6 Clicks Daily Activity     Outcome Measure Help from another person eating meals?: A Little Help from another  person taking care of personal grooming?: A Little Help from another person toileting, which includes using toliet, bedpan, or urinal?: A Lot Help from another person bathing (including washing, rinsing, drying)?: A Lot Help from another person to put on and taking off regular upper body clothing?: A Lot Help from another person to put on and taking off regular lower body clothing?: A Lot 6 Click Score: 14   End of Session Nurse Communication: Mobility status  Activity Tolerance: Patient limited by fatigue Patient left: in chair;with call bell/phone within reach;with family/visitor present  OT Visit Diagnosis: Muscle weakness (generalized) (M62.81)                Time: 9047-8995 OT Time Calculation (min): 12 min Charges:  OT General Charges $OT Visit: 1 Visit OT Evaluation $OT Eval Moderate Complexity: 1 Mod  Mliss NOVAK, OTR/L Acute Rehab Services Office: [phone]   Mliss Fish 05/22/2024, 10:27 AM

## 2024-05-22 NOTE — Progress Notes (Signed)
 NAME:  Danielle Flores, MRN:  [id number], DOB:  [birth year], LOS: 4 ADMISSION DATE:  05/18/2024, CONSULTATION DATE:  05/18/2024 REFERRING MD:  April Palumbo, MD, CHIEF COMPLAINT:  SOB  History of Present Illness:  58 y/o female with PMH for DMT2 on insulin , HTN, HLD, CAD s/p MI 09/2018, S/p 1 coronary stent who presents with worsening flu like symptoms w/ N/V x 3 days and no diarrhea, she had generalized weakness.  She had been taking her insulin .  She says she has been getting worse rather than better. In the ED she was found to have NSTEMI with increased Toponin 1,322, BG on chenistry 524, K+ 2.9, WBC 14.3, NA 126, Bicarb 12, AG 24.  Covid, flu and RSSV negative.  Pertinent  Medical History  DMT2 on insulin , HTN, HLD, CAD s/p MI 09/2018, S/p 1 coronary stent  **Jehova's witness**no blood transfusions**  Significant Hospital Events: Including procedures, antibiotic start and stop dates in addition to other pertinent events   10/12: NSTEMI and DKA, requiring application of BiPAP in ED 10/13 patient spiked fever with Tmax of 103, came off of BiPAP on salter oxygen, remained on insulin  infusion 10/15 Fever curve improved, no culture growth, remains on CRRT 10/16 hemodynamic stable continuing to tolerate CRRT oxygen down to 2 L  Interim History / Subjective:  Breathing easier, no distress.  Frustrated because of slow progress  Objective    Blood pressure (!) 104/54, pulse (!) 110, temperature 98 F (36.7 C), temperature source Oral, resp. rate (!) 28, height 5' 5 (1.651 m), weight 96.4 kg, SpO2 93%.    Vent Mode: PCV;BIPAP FiO2 (%):  [40 %] 40 % Set Rate:  [15 bmp] 15 bmp PEEP:  [5 cmH20] 5 cmH20   Intake/Output Summary (Last 24 hours) at 05/22/2024 0927 Last data filed at 05/22/2024 0912 Gross per 24 hour  Intake 1621.08 ml  Output 5990.1 ml  Net -4369.02 ml   Filed Weights   05/21/24 0345 05/21/24 0645 05/22/24 0500  Weight: 104.9 kg 103.4 kg 96.4 kg   General This is a  pleasant 58 year old female patient sitting up in chair bedside.  She is in no acute distress currently on 2 L/min HEENT normocephalic atraumatic no jugular venous distention is appreciated Pulmonary crackles both bases, currently no accessory use on 2 L/min.  Portable chest x-ray reviewed from the 14th showed bilateral airspace disease right greater than left Cardiac tachycardic regular rhythm without murmur rub or gallop Abdomen soft nontender no organomegaly Extremities warm dry brisk cap refill   Resolved problem list  Hypokalemia/hypomagnesemia/hypophosphatemia/hyponatremia DKA Assessment and Plan   Severe sepsis with septic shock due to right middle lobe pneumonia Off pressors Plan Continuing volume removal as tolerated via CRRT Currently day #6 broad-spectrum antibiotics will complete a total of 7 days  Acute hypoxic resp failure d/t RML PNA  Supplemental oxygen requirements have improved Plan Repeating chest x-ray today Pulmonary hygiene measures Mobilize Wean oxygen for saturations greater than 92% Continuous pulse oximetry Repeating chest x-ray today Volume removal as tolerated via CRRT  Diabetes type 2 w/ hyperglyemia  DKA resolved Plan Ssi Increased basal to 20 units daily  Demand cardiac ischemia -Echocardiogram showed no wall motion abnormalities with normal function, likely this troponin elevation was due to demand cardiac ischemia from severe sepsis -IV heparin  infusion was stopped Plan Cont tele  Cont asa and plavix    Acute Kidney Injury due to ATN, anuric Hypervolemic hyponatremia/hypocalcemia -started on CRRT 10/14, 2L off -remains anuric Plan Continuing CRRT until  current filter clogs off and transition to intermittent dialysis Serial labs  Assure MAP > 65  Shock liver -LFT's pending; had been rising  Plan Hold statins for now Repeat LFTs today and tomorrow   Thrombocytopenia critical illness Improving Plan Trend   Jehovah's witness,  no transfusions  Plan Limit lab draws as able    CRITICAL CARE not applicable my time 37 minutes Performed by: Jeralyn FORBES Banner

## 2024-05-23 DIAGNOSIS — R6521 Severe sepsis with septic shock: Secondary | ICD-10-CM | POA: Diagnosis not present

## 2024-05-23 DIAGNOSIS — J9601 Acute respiratory failure with hypoxia: Secondary | ICD-10-CM | POA: Diagnosis not present

## 2024-05-23 DIAGNOSIS — A419 Sepsis, unspecified organism: Secondary | ICD-10-CM | POA: Diagnosis not present

## 2024-05-23 DIAGNOSIS — E1110 Type 2 diabetes mellitus with ketoacidosis without coma: Secondary | ICD-10-CM | POA: Diagnosis not present

## 2024-05-23 LAB — CULTURE, BLOOD (ROUTINE X 2)
Culture: NO GROWTH
Culture: NO GROWTH

## 2024-05-23 LAB — COMPREHENSIVE METABOLIC PANEL WITH GFR
ALT: 177 U/L — ABNORMAL HIGH (ref 0–44)
AST: 812 U/L — ABNORMAL HIGH (ref 15–41)
Albumin: 1.7 g/dL — ABNORMAL LOW (ref 3.5–5.0)
Alkaline Phosphatase: 159 U/L — ABNORMAL HIGH (ref 38–126)
Anion gap: 15 (ref 5–15)
BUN: 51 mg/dL — ABNORMAL HIGH (ref 6–20)
CO2: 21 mmol/L — ABNORMAL LOW (ref 22–32)
Calcium: 7.6 mg/dL — ABNORMAL LOW (ref 8.9–10.3)
Chloride: 98 mmol/L (ref 98–111)
Creatinine, Ser: 4.3 mg/dL — ABNORMAL HIGH (ref 0.44–1.00)
GFR, Estimated: 11 mL/min — ABNORMAL LOW (ref >60)
Glucose, Bld: 229 mg/dL — ABNORMAL HIGH (ref 70–99)
Potassium: 4.6 mmol/L (ref 3.5–5.1)
Sodium: 134 mmol/L — ABNORMAL LOW (ref 135–145)
Total Bilirubin: 0.9 mg/dL (ref 0.0–1.2)
Total Protein: 5.6 g/dL — ABNORMAL LOW (ref 6.5–8.1)

## 2024-05-23 LAB — CBC
HCT: 22.1 % — ABNORMAL LOW (ref 36.0–46.0)
Hemoglobin: 7.6 g/dL — ABNORMAL LOW (ref 12.0–15.0)
MCH: 23.7 pg — ABNORMAL LOW (ref 26.0–34.0)
MCHC: 34.4 g/dL (ref 30.0–36.0)
MCV: 68.8 fL — ABNORMAL LOW (ref 80.0–100.0)
Platelets: 127 K/uL — ABNORMAL LOW (ref 150–400)
RBC: 3.21 MIL/uL — ABNORMAL LOW (ref 3.87–5.11)
RDW: 14.8 % (ref 11.5–15.5)
WBC: 14.2 K/uL — ABNORMAL HIGH (ref 4.0–10.5)
nRBC: 0.2 % (ref 0.0–0.2)

## 2024-05-23 LAB — RENAL FUNCTION PANEL
Albumin: 1.7 g/dL — ABNORMAL LOW (ref 3.5–5.0)
Anion gap: 15 (ref 5–15)
BUN: 52 mg/dL — ABNORMAL HIGH (ref 6–20)
CO2: 20 mmol/L — ABNORMAL LOW (ref 22–32)
Calcium: 7.6 mg/dL — ABNORMAL LOW (ref 8.9–10.3)
Chloride: 98 mmol/L (ref 98–111)
Creatinine, Ser: 4.28 mg/dL — ABNORMAL HIGH (ref 0.44–1.00)
GFR, Estimated: 11 mL/min — ABNORMAL LOW (ref >60)
Glucose, Bld: 227 mg/dL — ABNORMAL HIGH (ref 70–99)
Phosphorus: 2.7 mg/dL (ref 2.5–4.6)
Potassium: 4.6 mmol/L (ref 3.5–5.1)
Sodium: 133 mmol/L — ABNORMAL LOW (ref 135–145)

## 2024-05-23 LAB — GLUCOSE, CAPILLARY
Glucose-Capillary: 310 mg/dL — ABNORMAL HIGH (ref 70–99)
Glucose-Capillary: 299 mg/dL — ABNORMAL HIGH (ref 70–99)
Glucose-Capillary: 341 mg/dL — ABNORMAL HIGH (ref 70–99)
Glucose-Capillary: 257 mg/dL — ABNORMAL HIGH (ref 70–99)

## 2024-05-23 LAB — MAGNESIUM: Magnesium: 2.9 mg/dL — ABNORMAL HIGH (ref 1.7–2.4)

## 2024-05-23 MED ORDER — INSULIN GLARGINE-YFGN 100 UNIT/ML ~~LOC~~ SOLN
5.0000 [IU] | Freq: Once | SUBCUTANEOUS | Status: AC
  Administered 2024-05-23: 5 [IU] via SUBCUTANEOUS
  Filled 2024-05-23: qty 0.05

## 2024-05-23 MED ORDER — PIPERACILLIN-TAZOBACTAM IN DEX 2-0.25 GM/50ML IV SOLN
2.2500 g | Freq: Three times a day (TID) | INTRAVENOUS | Status: AC
Start: 2024-05-23 — End: 2024-05-25
  Administered 2024-05-23 – 2024-05-25 (×6): 2.25 g via INTRAVENOUS
  Filled 2024-05-23 (×8): qty 50

## 2024-05-23 MED ORDER — INSULIN ASPART 100 UNIT/ML IJ SOLN
8.0000 [IU] | Freq: Three times a day (TID) | INTRAMUSCULAR | Status: DC
  Administered 2024-05-23 – 2024-06-03 (×16): 8 [IU] via SUBCUTANEOUS

## 2024-05-23 MED ORDER — INSULIN GLARGINE-YFGN 100 UNIT/ML ~~LOC~~ SOLN
25.0000 [IU] | Freq: Every day | SUBCUTANEOUS | Status: DC
  Administered 2024-05-24 – 2024-05-27 (×4): 25 [IU] via SUBCUTANEOUS
  Filled 2024-05-23 (×5): qty 0.25

## 2024-05-23 NOTE — Progress Notes (Signed)
 NAME:  Danielle Flores, MRN:  [id number], DOB:  07/09/1966, LOS: 5 ADMISSION DATE:  05/18/2024, CONSULTATION DATE:  05/18/2024 REFERRING MD:  April Palumbo, MD, CHIEF COMPLAINT:  SOB  History of Present Illness:  58 y/o female with PMH for DMT2 on insulin , HTN, HLD, CAD s/p MI 09/2018, S/p 1 coronary stent who presents with worsening flu like symptoms w/ N/V x 3 days and no diarrhea, she had generalized weakness.  She had been taking her insulin .  She says she has been getting worse rather than better. In the ED she was found to have NSTEMI with increased Toponin 1,322, BG on chenistry 524, K+ 2.9, WBC 14.3, NA 126, Bicarb 12, AG 24.  Covid, flu and RSSV negative.  Pertinent  Medical History  DMT2 on insulin , HTN, HLD, CAD s/p MI 09/2018, S/p 1 coronary stent  **Jehova's witness**no blood transfusions**  Significant Hospital Events: Including procedures, antibiotic start and stop dates in addition to other pertinent events   10/12: NSTEMI and DKA, requiring application of BiPAP in ED 10/13 patient spiked fever with Tmax of 103, came off of BiPAP on [place] oxygen, remained on insulin  infusion 10/15 Fever curve improved, no culture growth, remains on CRRT 10/16 hemodynamic stable continuing to tolerate CRRT oxygen down to 2 L  Interim History / Subjective:  Breathing easier, no distress.  Frustrated because of slow progress  Objective    Blood pressure 129/66, pulse (!) 106, temperature 97.9 F (36.6 C), temperature source Oral, resp. rate (!) 41, height 5' 5 (1.651 m), weight 96.1 kg, SpO2 96%.        Intake/Output Summary (Last 24 hours) at 05/23/2024 0937 Last data filed at 05/23/2024 0900 Gross per 24 hour  Intake 395.62 ml  Output 750.3 ml  Net -354.68 ml   Filed Weights   05/21/24 0645 05/22/24 0500 05/23/24 0500  Weight: 103.4 kg 96.4 kg 96.1 kg   General This is a pleasant 58 year old female patient sitting up in chair bedside.  She is in no acute distress currently on  2 L/min HEENT normocephalic atraumatic no jugular venous distention is appreciated Pulmonary crackles both bases, currently no accessory use on 2 L/min.  Portable chest x-ray reviewed from the 14th showed bilateral airspace disease right greater than left Cardiac tachycardic regular rhythm without murmur rub or gallop Abdomen soft nontender no organomegaly Extremities warm dry brisk cap refill   Resolved problem list  Hypokalemia/hypomagnesemia/hypophosphatemia/hyponatremia DKA Assessment and Plan  Severe sepsis with septic shock, POA, now resolved Acute respiratory failure with hypoxia in the setting of bilateral multifocal pneumonia Diabetes type 2, poorly controlled with hyperglycemia, presented with DKA Hypervolemic hyponatremia Acute kidney injury due to septic ATN Acute metabolic acidosis Demand cardiac ischemia in the setting of septic shock Shock liver, improving Anemia and thrombocytopenia critical illness  Shock has resolved, sepsis indices have improved Remain on nasal cannula oxygen, did not require BiPAP overnight Continue to titrate nasal cannula with O2 sat goal 92%, currently on 3 L Blood sugar at still not well-controlled Increase Lantus  to 25 units daily, add tube feed coverage and sliding scale insulin  with CBG goal 140-180 Monitor intake and output CRRT was stopped yesterday in the afternoon, serum creatinine trended up to 4.28 Not making urine Nephrology is following, patient blood pressure is stable, she may need IHD Serum bicarbonate trended down to 20, closely monitor Echocardiogram showed no wall motion abnormalities, troponins were elevated likely due to demand ischemia LFTs started improving, closely monitor Platelet count started improving,  hemoglobin dropped down to 7.6, closely monitor and transfuse if less than 7   Jehovah's witness, no transfusions  Plan Limit lab draws as able      [name], MD [facility] See Amion  for pager If no response to pager, please call [phone] until 7pm After 7pm, Please call E-link [phone]

## 2024-05-23 NOTE — Plan of Care (Signed)

## 2024-05-23 NOTE — Progress Notes (Signed)
 Physical Therapy Treatment Patient Details Name: Danielle Flores MRN: [id number] DOB: 06-11-1966 Today's Date: 05/23/2024   History of Present Illness 58 yo presenting to [facility] on 10/12 due to fever, rhinorrhea, congestion, SOB progressing over the past 3 days. Also with nausea and vomiting for 2 days.  Pt found to be in acute respiratory failure with hypoxia in setting of bil multifocal pneumonia and severe sepsis with septic shock; complicated by AKI due to septic NSTEMI and DKA. Pt on CRRT off starting 10/16  PMH: DM II, hyperlipidemia, CAD.    PT Comments  Pt admitted with above diagnosis. Pt was able to ambulate with Elyn walker a short distance with min assist +2 for safety with pt fatiguing quickly and needing a seated rest break fairly quickly. Pt rested and then OT arrived and pt stood at sink with min asssist of 2 to brush teeth.  Pt fatigued quickly with this as well and could not tolerate any more standing. Did perform some UE and LE exercises.  Pt requires rest breaks. Pt reports transfer 6 ft to be 10/10 fatigue. Pt benefits from pursed lip education and return demo. Recommendation for HHPT unless friend can't assist and if she cannot, may need post acute rehab. Pt expressed spouse works weekends parttime and that she thinks a friend could assist her. Pt encouraged to start having conversations with friends and family about (A) up d/c.     Pt currently with functional limitations due to the deficits listed below (see PT Problem List). Pt will benefit from acute skilled PT to increase their independence and safety with mobility to allow discharge.       If plan is discharge home, recommend the following: Assist for transportation;Assistance with cooking/housework;Help with stairs or ramp for entrance;A little help with walking and/or transfers   Can travel by private vehicle        Equipment Recommendations  Wheelchair (measurements PT);Wheelchair cushion (measurements PT);Rolling walker  (2 wheels);BSC/3in1    Recommendations for Other Services       Precautions / Restrictions Precautions Precautions: Fall Recall of Precautions/Restrictions: Intact Restrictions Weight Bearing Restrictions Per Provider Order: No     Mobility  Bed Mobility Overal bed mobility: Needs Assistance Bed Mobility: Supine to Sit, Rolling Rolling: Min assist   Supine to sit: Min assist, +2 for safety/equipment     General bed mobility comments: Needed assist and cues to roll over and sit up.  Incr time as well.    Transfers Overall transfer level: Needs assistance Equipment used: None Transfers: Sit to/from Stand Sit to Stand: Min assist, +2 safety/equipment           General transfer comment: cues for hand placement and min asssist to come to standing from bed.  Unsteady initially and bil LE buckling initially requiring incr assist and cues.    Ambulation/Gait Ambulation/Gait assistance: Min assist, +2 safety/equipment Gait Distance (Feet): 6 Feet Assistive device: Elyn Finder Gait Pattern/deviations: Step-through pattern, Decreased stride length, Decreased step length - right, Decreased step length - left, Knee flexed in stance - right, Knee flexed in stance - left, Knees buckling, Shuffle, Drifts right/left, Trunk flexed   Gait velocity interpretation: <1.31 ft/sec, indicative of household ambulator   General Gait Details: Pt was able to ambulate with Elyn walker short distance walking to sink as OT also came in room to see pt.  Pt needing min assist of 2 for stability when brushing teeth at sink.  Needed 1 sitting rest break prior to  brushing teeth therefore had brought the chair for pt to sit. OT finished with pt.  Pt with unsteadiness bil LEs with no knee buckling that required heavy assist but pt still with slow and cautious gait and neeeded assist to steer Elyn walker.   Stairs             Wheelchair Mobility     Tilt Bed    Modified Rankin (Stroke Patients  Only)       Balance Overall balance assessment: Needs assistance Sitting-balance support: No upper extremity supported, Feet supported Sitting balance-Leahy Scale: Fair     Standing balance support: Bilateral upper extremity supported, During functional activity, Reliant on assistive device for balance Standing balance-Leahy Scale: Poor Standing balance comment: Min A for balance in standing with bil UE suport on Eval walker and external support by therapist                            Communication Communication Communication: No apparent difficulties  Cognition Arousal: Lethargic, Alert Behavior During Therapy: WFL for tasks assessed/performed                             Following commands: Intact      Cueing Cueing Techniques: Verbal cues  Exercises Other Exercises Other Exercises: AROM bil UE x10 reps, ankle pumps x10 , hip flexion x10 reps, knee extention 10 reps Other Exercises: incentive spirometer    General Comments General comments (skin integrity, edema, etc.): Pt on 2LO2 with Sats 88-93%.  Other VSS      Pertinent Vitals/Pain Pain Assessment Pain Assessment: Faces Faces Pain Scale: Hurts a little bit Pain Location: generalized Pain Descriptors / Indicators: Discomfort Pain Intervention(s): Limited activity within patient's tolerance, Monitored during session, Repositioned    Home Living Family/patient expects to be discharged to:: Private residence Living Arrangements: Spouse/significant other Available Help at Discharge: Family;Available 24 hours/day;Friend(s) Type of Home: House Home Access: Stairs to enter Entrance Stairs-Rails: None Entrance Stairs-Number of Steps: 3   Home Layout: One level Home Equipment: None      Prior Function            PT Goals (current goals can now be found in the care plan section) Acute Rehab PT Goals Patient Stated Goal: to return to home and work Progress towards PT goals:  Progressing toward goals    Frequency    Min 2X/week      PT Plan      Co-evaluation              AM-PAC PT 6 Clicks Mobility   Outcome Measure  Help needed turning from your back to your side while in a flat bed without using bedrails?: A Little Help needed moving from lying on your back to sitting on the side of a flat bed without using bedrails?: A Little Help needed moving to and from a bed to a chair (including a wheelchair)?: A Little Help needed standing up from a chair using your arms (e.g., wheelchair or bedside chair)?: Total Help needed to walk in hospital room?: Total Help needed climbing 3-5 steps with a railing? : Total 6 Click Score: 12    End of Session Equipment Utilized During Treatment: Gait belt;Oxygen Activity Tolerance: Patient tolerated treatment well;Patient limited by fatigue Patient left: in chair;with call bell/phone within reach;with chair alarm set Nurse Communication: Mobility status PT Visit Diagnosis: Unsteadiness on  feet (R26.81);Other abnormalities of gait and mobility (R26.89);Muscle weakness (generalized) (M62.81)     Time: 8947-8883 PT Time Calculation (min) (ACUTE ONLY): 24 min  Charges:    $Gait Training: 8-22 mins $Therapeutic Exercise: 8-22 mins PT General Charges $$ ACUTE PT VISIT: 1 Visit                     Bridgitte Felicetti M,PT Acute Rehab Services [phone]    Danielle Flores 05/23/2024, 3:31 PM

## 2024-05-23 NOTE — Progress Notes (Signed)
 Patient ID: Danielle Flores, female   DOB: [birth year], 58 y.o.   MRN: [id number] S: No new complaints.  Off of CRRT since 12:30 pm yesterday.  No significant UOP O:BP 129/66   Pulse (!) 106   Temp 97.9 F (36.6 C) (Oral)   Resp (!) 41   Ht 5' 5 (1.651 m)   Wt 96.1 kg   LMP  (LMP Unknown)   SpO2 96%   BMI 35.26 kg/m   Intake/Output Summary (Last 24 hours) at 05/23/2024 0950 Last data filed at 05/23/2024 0900 Gross per 24 hour  Intake 395.62 ml  Output 750.3 ml  Net -354.68 ml   Intake/Output: I/O last 3 completed shifts: In: 1192.5 [P.O.:970; IV Piggyback:222.5] Out: 4245.2   Intake/Output this shift:  Total I/O In: 24.9 [IV Piggyback:24.9] Out: -  Weight change: -0.3 kg Gen: NAD CVS: tachy Resp: CTA Abd: +BS, soft, NT/ND Ext: no edema  Recent Labs  Lab 05/18/24 0156 05/18/24 1001 05/20/24 1007 05/20/24 1830 05/20/24 2155 05/21/24 0215 05/21/24 0555 05/21/24 1008 05/21/24 1334 05/21/24 1738 05/21/24 2104 05/22/24 0220 05/22/24 0615 05/22/24 1142 05/22/24 1620 05/23/24 0235 05/23/24 0236  NA 126*   < > 133* 132*   < > 134*   < > 133*   < > 131* 133* 134* 132* 133* 135 134* 133*  K 2.9*   < > 4.5 4.2   < > 4.5   < > 4.5   < > 4.8 4.8 4.7 4.8 4.9 5.0 4.6 4.6  CL 90*   < > 99 98   < > 100   < > 99   < > 97* 97* 99 98 98 96* 98 98  CO2 12*   < > 16* 18*   < > 18*   < > 19*   < > 20* 20* 22 20* 20* 22 21* 20*  GLUCOSE 524*   < > 141* 156*   < > 116*   < > 162*   < > 257* 256* 217* 231* 273* 260* 229* 227*  BUN 18   < > 52* 45*   < > 40*   < > 34*   < > 33* 34* 30* 29* 29* 36* 51* 52*  CREATININE 2.96*   < > 7.43* 5.67*   < > 4.91*   < > 4.16*   < > 3.58* 3.38* 3.06* 2.92* 2.83* 3.31* 4.30* 4.28*  ALBUMIN 2.6*  --   --  1.6*  --  1.9*  --  1.7*  --  1.9*  --  1.8*  --  1.9* 1.8* 1.7* 1.7*  CALCIUM  8.4*   < > 7.1* 7.1*   < > 7.6*   < > 7.5*   < > 7.7* 7.6* 7.8* 8.0* 7.9* 7.8* 7.6* 7.6*  PHOS  --    < > 4.1 3.1  --  3.1  --   --   --  2.8  --  2.5  --   --  3.1   --  2.7  AST 233*  --   --   --   --   --   --  1,128*  --   --   --   --   --  997*  --  812*  --   ALT 62*  --   --   --   --   --   --  192*  --   --   --   --   --  204*  --  177*  --    < > = values in this interval not displayed.   Liver Function Tests: Recent Labs  Lab 05/21/24 1008 05/21/24 1738 05/22/24 1142 05/22/24 1620 05/23/24 0235 05/23/24 0236  AST 1,128*  --  997*  --  812*  --   ALT 192*  --  204*  --  177*  --   ALKPHOS 123  --  166*  --  159*  --   BILITOT 1.4*  --  1.0  --  0.9  --   PROT 5.6*  --  6.1*  --  5.6*  --   ALBUMIN 1.7*   < > 1.9* 1.8* 1.7* 1.7*   < > = values in this interval not displayed.   No results for input(s): LIPASE, AMYLASE in the last 168 hours. No results for input(s): AMMONIA in the last 168 hours. CBC: Recent Labs  Lab 05/18/24 0156 05/18/24 0331 05/18/24 1001 05/19/24 0318 05/20/24 0422 05/20/24 0647 05/21/24 0555 05/22/24 0615 05/23/24 0235  WBC 14.3* 13.4*   < > 12.5* 8.7  --  10.4 11.2* 14.2*  NEUTROABS 10.9* 10.5*  --   --   --   --   --   --   --   HGB 13.8 13.8   < > 14.3 10.5*   < > 9.0* 9.0* 7.6*  HCT 40.1 40.8   < > 41.9 29.5*   < > 25.2* 25.7* 22.1*  MCV 69.5* 70.1*   < > 68.5* 68.0*  --  67.2* 68.0* 68.8*  PLT 161 127*   < > 135* PLATELET CLUMPS NOTED ON SMEAR, UNABLE TO ESTIMATE   < > 99* 107* 127*   < > = values in this interval not displayed.   Cardiac Enzymes: No results for input(s): CKTOTAL, CKMB, CKMBINDEX, TROPONINI in the last 168 hours. CBG: Recent Labs  Lab 05/22/24 0728 05/22/24 1156 05/22/24 1651 05/22/24 2140 05/23/24 0824  GLUCAP 230* 280* 268* 292* 310*    Iron Studies: No results for input(s): IRON, TIBC, TRANSFERRIN, FERRITIN in the last 72 hours. Studies/Results: DG Chest Port 1 View Result Date: 05/22/2024 EXAM: 1 VIEW(S) XRAY OF THE CHEST 05/22/2024 10:35:00 AM COMPARISON: 05/20/2024 CLINICAL HISTORY: pneumonia FINDINGS: LINES, TUBES AND DEVICES: Stable  right IJ central venous catheter. LUNGS AND PLEURA: Low lung volumes. Persistent bibasilar airspace opacities, unchanged from prior. HEART AND MEDIASTINUM: Aortic atherosclerosis. BONES AND SOFT TISSUES: No acute osseous abnormality. IMPRESSION: 1. Persistent bibasilar airspace opacities, unchanged from prior. Electronically signed by: Oneil Devonshire MD 05/22/2024 02:25 PM EDT RP Workstation: [id number]    (feeding supplement) PROSource Plus  30 mL Oral BID BM   Chlorhexidine Gluconate Cloth  6 each Topical Daily   clopidogrel   75 mg Oral Daily   doxycycline  100 mg Oral Q12H   ezetimibe   10 mg Oral Daily   feeding supplement  237 mL Oral BID BM   Gerhardt's butt cream   Topical Daily   heparin  injection (subcutaneous)  5,000 Units Subcutaneous Q8H   insulin  aspart  0-15 Units Subcutaneous TID WC   insulin  aspart  0-5 Units Subcutaneous QHS   insulin  aspart  8 Units Subcutaneous TID WC   [START ON 05/24/2024] insulin  glargine-yfgn  25 Units Subcutaneous Daily   insulin  glargine-yfgn  5 Units Subcutaneous Once   multivitamin  1 tablet Oral QHS    BMET    Component Value Date/Time   NA 133 (L) 05/23/2024 0236   NA 144  12/20/2018 0956   K 4.6 05/23/2024 0236   CL 98 05/23/2024 0236   CO2 20 (L) 05/23/2024 0236   GLUCOSE 227 (H) 05/23/2024 0236   BUN 52 (H) 05/23/2024 0236   BUN 13 12/20/2018 0956   CREATININE 4.28 (H) 05/23/2024 0236   CREATININE 1.05 (H) 08/10/2023 1437   CALCIUM  7.6 (L) 05/23/2024 0236   GFRNONAA 11 (L) 05/23/2024 0236   GFRAA 84 12/20/2018 0956   CBC    Component Value Date/Time   WBC 14.2 (H) 05/23/2024 0235   RBC 3.21 (L) 05/23/2024 0235   HGB 7.6 (L) 05/23/2024 0235   HCT 22.1 (L) 05/23/2024 0235   PLT 127 (L) 05/23/2024 0235   MCV 68.8 (L) 05/23/2024 0235   MCH 23.7 (L) 05/23/2024 0235   MCHC 34.4 05/23/2024 0235   RDW 14.8 05/23/2024 0235   LYMPHSABS 2.4 05/18/2024 0331   MONOABS 0.5 05/18/2024 0331   EOSABS 0.0 05/18/2024 0331   BASOSABS 0.0  05/18/2024 0331    Assessment/Plan:  AKI - in setting of DKA.  UA with gluc, blood, and protein.  Negative nitrite and renal US  without obstruction.  Likely ischemic ATN in setting of volume depletion and acute illness as well as hypotension (her Bp dropped to 89/43 earlier yesterday).  Worsening work of breathing and no UOP overnight.  Worsening metabolic acidosis and likely due to AKI and not DKA.  Temp RIJ HD catheter placed by PCCM on 05/20/24 and CRRT initiated.  All fluids 4K/2.5Ca.  goal UF 50-150 mL/hr.  Able to UF 3.7 liters overnight with improved respiratory status.  No improvement of UOP at this time.  Will continue to follow closely and hopefully can transition to IHD in the next 24-48 hours.  CRRT stopped at 12:30 pm on 05/22/24.  Hold off on HD for now and follow for renal recovery.  Will likely need IHD in the next 24-48 hours. Avoid nephrotoxic medications including NSAIDs and iodinated intravenous contrast exposure unless the latter is absolutely indicated.   Preferred narcotic agents for pain control are hydromorphone, fentanyl , and methadone. Morphine should not be used.  Avoid Baclofen and avoid oral sodium phosphate and magnesium citrate based laxatives / bowel preps.  Continue strict Input and Output monitoring.  Will monitor the patient closely with you and intervene or adjust therapy as indicated by changes in clinical status/labs  Severe sepsis with septic shock due to RML pneumonia - currently on Zosyn and doxycycline.  Given 1 dose of Vancomycin 2 grams upon presentation. Acute hypoxic respiratory failure - currently on nasal cannula 8 L.  Per PCCM Acute NSTEMI - on IV heparin  and being followed by Cardiology. Hyponatremia - due to DKA and AKI Hypokalemia - due to DKA. IMproved DKA - off insulin  drip per PCCM Abnormal LFT's - likely shock liver.   Thrombocytopenia - due to acute critical illness.  Continue to monitor. AGMA - due to #1 and #7.  Metformin  stopped.   Improved with CRRT  Anemia - likely due to critical illness and AKI.  Will continue to follow and may need to start ESA as she will not accept blood transfusions.   Fairy RONAL Sellar, MD [facility]

## 2024-05-23 NOTE — Progress Notes (Signed)
 Nutrition Follow-up  DOCUMENTATION CODES:   Not applicable  INTERVENTION:   Liberalize diet to Carb Modified with 1500 mL Fluid Restriction  Recommend further adjustments in scheduled insulin ; noted pt taking 70/30 26 units BID in addition to metformin  as output with HgbA1c 9.9  Ensure Plus High Protein po BID, each supplement provides 350 kcal and 20 grams of protein.  Continue 30 ml ProSource Plus BID, each supplement provides 100 kcals and 15 grams protein.    Continue Renal MVI   NUTRITION DIAGNOSIS:   Increased nutrient needs related to acute illness as evidenced by estimated needs.  Being addressed  GOAL:   Patient will meet greater than or equal to 90% of their needs  Progressing  MONITOR:   PO intake, Supplement acceptance, I & O's, Labs, Weight trends  REASON FOR ASSESSMENT:   Consult Assessment of nutrition requirement/status (CRRT)  ASSESSMENT:   58 yo female admitted with severe sepsis with pneumonia, acute respiratory failure, anuric AKI with acidosis, poorly controlled DM. PMH includes HTN, HLD, CAD s/p MI with stent x 1, DM2 on insulin .  10/12 Admitted NSTEMI, sepsis 10/13 BiPap, Febrile Tmax 103 10/14 CRRT initiated  10/16 CRRT discontinued 10/17 Remains anuric, Creatinine rising  Currently on [place], sitting in chair  CRRT off since around 12pm yesterday. Noted possible iHD in next 24-48 hours but watching for renal recovery Remains anuric. Remains net positive for this admission based on I/O flowsheet  Pt reports appetite is poor but she eats because she knows she has to. Oral intake has improved since initial assessment on 10/14. Pt drinking Ensure sometimes; pt also taking Pro-Source Recorded po intake 40-100% of meals  Pt still with edema on exam, including in abdominal/thoracic region Current wt 96.1 kg, highest wt 107.3 kg.  Pt reports UBW 189 pounds (86 kg)  CBGs poorly controlled at present; range 230-310. Pt with poorly controlled  DM at baseline. Pt will likely requiring further increase in insulin  to obtain CBGs <200 Lab Results  Component Value Date   HGBA1C 9.9 (H) 05/18/2024   HGBA1C 7.6 (H) 10/23/2023   HGBA1C 11.8 (H) 08/10/2023     Labs: Sodium 133 BUN 52 Creatinine 4.28 Potassium 4.6 (wdl) Phosphorus 2.7 (wdl) Magnesium 2.9 (H) Elevated LFTs-trending down  Meds: Ss novolog  with meals and bedtime Novolog  8 units with meals Semglee  25 units started today Rena-vite  Diet Order:   Diet Order             Diet heart healthy/carb modified Room service appropriate? Yes; Fluid consistency: Thin; Fluid restriction: 1500 mL Fluid  Diet effective now                   EDUCATION NEEDS:   Not appropriate for education at this time  Skin:  Skin Assessment: Reviewed RN Assessment  Last BM:  10/13  type 7  large green/brown, mucousy (frothy)  Height:   Ht Readings from Last 1 Encounters:  05/18/24 5' 5 (1.651 m)    Weight:   Wt Readings from Last 1 Encounters:  05/23/24 96.1 kg     BMI:  Body mass index is 35.26 kg/m.  Estimated Nutritional Needs:   Kcal:  1700-1900 kcals  Protein:  100-125 g  Fluid:  1L plus UOP   Betsey Finger MS, RDN, LDN, CNSC Registered Dietitian 3 Clinical Nutrition RD Inpatient Contact Info in Amion

## 2024-05-23 NOTE — Treatment Plan (Signed)
 Occupational Therapy Treatment Patient Details Name: Danielle Flores MRN: [id number] DOB: 1965/10/02 Today's Date: 05/23/2024   History of present illness 58 yo presenting [facility] on 10/12 due to fever, rhinorrhea, congestion, SOB progressing over the past 3 days. Also with nausea and vomiting for 2 days.  Pt found to be in acute respiratory failure with hypoxia in setting of bil multifocal pneumonia and severe sepsis with septic shock; complicated by AKI due to septic NSTEMI and DKA. Pt on CRRT off starting 10/16  PMH: DM II, hyperlipidemia, CAD.   OT comments  Pt currently with extreme fatigue with basic transfer and static standing for oral care. Pt requires rest break. Pt reports transfer 5 ft to be 10/10 fatigue. Pt benefits from pursed lip education and return demo. Recommendation for HHOT at this time. Pt expressed spouse works weekends parttime and that she thinks a friend could assist her. Pt encouraged to start having conversations with friends and family about (A) up d/c.       If plan is discharge home, recommend the following:  A lot of help with walking and/or transfers;A lot of help with bathing/dressing/bathroom   Equipment Recommendations  BSC/3in1;Other (comment) (RW, transport chair for distance)    Recommendations for Other Services PT consult    Precautions / Restrictions Precautions Precautions: Fall Recall of Precautions/Restrictions: Intact Restrictions Weight Bearing Restrictions Per Provider Order: No       Mobility Bed Mobility               General bed mobility comments: oob    Transfers Overall transfer level: Needs assistance   Transfers: Sit to/from Stand Sit to Stand: Min assist, +2 safety/equipment     Step pivot transfers: Min assist, +2 safety/equipment     General transfer comment: x2 sit<>Stand during session with cues to reach for chair surface     Balance Overall balance assessment: Needs assistance         Standing  balance support: Bilateral upper extremity supported, During functional activity, Reliant on assistive device for balance Standing balance-Leahy Scale: Poor                             ADL either performed or assessed with clinical judgement   ADL Overall ADL's : Needs assistance/impaired Eating/Feeding: Set up;Sitting   Grooming: Oral care;Wash/dry face;Minimal assistance;Standing Grooming Details (indicate cue type and reason): DOE 3 out 4 RR 40 2L O2 [place]. benefits from energy conservation strategies next session Upper Body Bathing: Moderate assistance               Toilet Transfer: +2 for physical assistance;Minimal assistance (eva)             General ADL Comments: pt standing on arrival in EVA. End of session education on pursed lip breathing on 2L [place] 02 with RR 38 down to 19. pt with good return demo    Extremity/Trunk Assessment Upper Extremity Assessment Upper Extremity Assessment: Generalized weakness   Lower Extremity Assessment Lower Extremity Assessment: Generalized weakness;Defer to PT evaluation        Vision   Vision Assessment?: No apparent visual deficits   Perception     Praxis     Communication Communication Communication: No apparent difficulties   Cognition Arousal: Lethargic, Alert Behavior During Therapy: WFL for tasks assessed/performed Cognition: No apparent impairments  Following commands: Intact        Cueing   Cueing Techniques: Verbal cues  Exercises Other Exercises Other Exercises: AROM bil UE x10 reps, ankle pumps x10 , hip flexion x10 reps,    Shoulder Instructions       General Comments 2L [place],    Pertinent Vitals/ Pain       Pain Assessment Pain Assessment: Faces Faces Pain Scale: Hurts a little bit Pain Location: generalized Pain Descriptors / Indicators: Discomfort Pain Intervention(s): Monitored during session, Repositioned  Home Living                                           Prior Functioning/Environment              Frequency  Min 2X/week        Progress Toward Goals  OT Goals(current goals can now be found in the care plan section)  Progress towards OT goals: Progressing toward goals  Acute Rehab OT Goals Patient Stated Goal: to rest-- pt reports 10 /10 fatigue with static standing for oral care OT Goal Formulation: With patient/family Time For Goal Achievement: 06/05/24 Potential to Achieve Goals: Good ADL Goals Pt Will Perform Lower Body Bathing: with min assist;sitting/lateral leans;sit to/from stand Pt Will Transfer to Toilet: with contact guard assist;ambulating Pt/caregiver will Perform Home Exercise Program: Increased strength;Both right and left upper extremity;With theraband;Independently;With written HEP provided Additional ADL Goal #1: Pt to verbalize at least 3 energy conservation strategies to implement during daily routine  Plan      Co-evaluation                 AM-PAC OT 6 Clicks Daily Activity     Outcome Measure   Help from another person eating meals?: A Little Help from another person taking care of personal grooming?: A Little Help from another person toileting, which includes using toliet, bedpan, or urinal?: A Lot Help from another person bathing (including washing, rinsing, drying)?: A Lot Help from another person to put on and taking off regular upper body clothing?: A Lot Help from another person to put on and taking off regular lower body clothing?: A Lot 6 Click Score: 14    End of Session Equipment Utilized During Treatment: Gait belt;Oxygen  OT Visit Diagnosis: Muscle weakness (generalized) (M62.81)   Activity Tolerance Patient limited by fatigue   Patient Left in chair;with call bell/phone within reach;with chair alarm set   Nurse Communication Mobility status;Precautions        Time: 1101-1120 OT Time Calculation (min): 19 min  Charges:  OT General Charges $OT Visit: 1 Visit OT Treatments $Self Care/Home Management : 8-22 mins   Brynn, OTR/L  Acute Rehabilitation Services Office: [phone] .   Ely Molt 05/23/2024, 1:40 PM

## 2024-05-24 DIAGNOSIS — A419 Sepsis, unspecified organism: Secondary | ICD-10-CM | POA: Diagnosis not present

## 2024-05-24 DIAGNOSIS — R6521 Severe sepsis with septic shock: Secondary | ICD-10-CM | POA: Diagnosis not present

## 2024-05-24 LAB — BASIC METABOLIC PANEL WITH GFR
Anion gap: 15 (ref 5–15)
BUN: 86 mg/dL — ABNORMAL HIGH (ref 6–20)
CO2: 20 mmol/L — ABNORMAL LOW (ref 22–32)
Calcium: 7.8 mg/dL — ABNORMAL LOW (ref 8.9–10.3)
Chloride: 99 mmol/L (ref 98–111)
Creatinine, Ser: 6.44 mg/dL — ABNORMAL HIGH (ref 0.44–1.00)
GFR, Estimated: 7 mL/min — ABNORMAL LOW (ref >60)
Glucose, Bld: 203 mg/dL — ABNORMAL HIGH (ref 70–99)
Potassium: 4.4 mmol/L (ref 3.5–5.1)
Sodium: 134 mmol/L — ABNORMAL LOW (ref 135–145)

## 2024-05-24 LAB — CBC
HCT: 21 % — ABNORMAL LOW (ref 36.0–46.0)
Hemoglobin: 7.3 g/dL — ABNORMAL LOW (ref 12.0–15.0)
MCH: 23.9 pg — ABNORMAL LOW (ref 26.0–34.0)
MCHC: 34.8 g/dL (ref 30.0–36.0)
MCV: 68.6 fL — ABNORMAL LOW (ref 80.0–100.0)
Platelets: 168 K/uL (ref 150–400)
RBC: 3.06 MIL/uL — ABNORMAL LOW (ref 3.87–5.11)
RDW: 15 % (ref 11.5–15.5)
WBC: 17.9 K/uL — ABNORMAL HIGH (ref 4.0–10.5)
nRBC: 0.2 % (ref 0.0–0.2)

## 2024-05-24 LAB — CULTURE, BLOOD (SINGLE): Culture: NO GROWTH

## 2024-05-24 LAB — GLUCOSE, CAPILLARY
Glucose-Capillary: 189 mg/dL — ABNORMAL HIGH (ref 70–99)
Glucose-Capillary: 236 mg/dL — ABNORMAL HIGH (ref 70–99)
Glucose-Capillary: 285 mg/dL — ABNORMAL HIGH (ref 70–99)

## 2024-05-24 LAB — RENAL FUNCTION PANEL
Albumin: 1.7 g/dL — ABNORMAL LOW (ref 3.5–5.0)
Anion gap: 15 (ref 5–15)
BUN: 83 mg/dL — ABNORMAL HIGH (ref 6–20)
CO2: 20 mmol/L — ABNORMAL LOW (ref 22–32)
Calcium: 7.7 mg/dL — ABNORMAL LOW (ref 8.9–10.3)
Chloride: 98 mmol/L (ref 98–111)
Creatinine, Ser: 6.64 mg/dL — ABNORMAL HIGH (ref 0.44–1.00)
GFR, Estimated: 7 mL/min — ABNORMAL LOW (ref >60)
Glucose, Bld: 203 mg/dL — ABNORMAL HIGH (ref 70–99)
Phosphorus: 3.2 mg/dL (ref 2.5–4.6)
Potassium: 4.4 mmol/L (ref 3.5–5.1)
Sodium: 133 mmol/L — ABNORMAL LOW (ref 135–145)

## 2024-05-24 LAB — IRON AND TIBC
Iron: 46 ug/dL (ref 28–170)
Saturation Ratios: 16 % (ref 10.4–31.8)
TIBC: 284 ug/dL (ref 250–450)
UIBC: 238 ug/dL

## 2024-05-24 LAB — MAGNESIUM: Magnesium: 2.9 mg/dL — ABNORMAL HIGH (ref 1.7–2.4)

## 2024-05-24 LAB — HEPATITIS B SURFACE ANTIGEN: Hepatitis B Surface Ag: NONREACTIVE

## 2024-05-24 MED ORDER — CHLORHEXIDINE GLUCONATE CLOTH 2 % EX PADS
6.0000 | MEDICATED_PAD | Freq: Every day | CUTANEOUS | Status: DC
  Administered 2024-05-25 – 2024-06-04 (×11): 6 via TOPICAL

## 2024-05-24 MED ORDER — HEPARIN SODIUM (PORCINE) 1000 UNIT/ML IJ SOLN
INTRAMUSCULAR | Status: AC
  Filled 2024-05-24: qty 5

## 2024-05-24 NOTE — Plan of Care (Signed)

## 2024-05-24 NOTE — Progress Notes (Signed)
 PROGRESS NOTE    Danielle Flores  FMW:[id number] DOB: [birth year] DOA: 05/18/2024 PCP: Antonio Cyndee Jamee JONELLE, DO  Outpatient Specialists:     Brief Narrative:  Patient is a 58 year old female past medical history significant for type 2 diabetes mellitus, DKA, coronary artery disease, hyperglycemia, hyperlipidemia and hypertension.  Patient was initially admitted to the ICU team with DKA, NSTEMI, acute kidney injury, UTI and shortness of breath.  Workup revealed severe sepsis with septic shock that was present on admission, acute kidney injury secondary to septic shock/ATN, initially requiring CRRT.  Patient is not on intermittent hemodialysis as needed.  TRH assumed care today, 05/24/2024.  05/24/2024: Patient was seen on hemodialysis.  Patient is sleeping.  Much history from patient.  1 L ultrafiltration.  Nephrology input is appreciated.   Assessment & Plan:   Principal Problem:   DKA (diabetic ketoacidosis) (HCC) Active Problems:   Sepsis with acute renal failure (HCC)   AKI (acute kidney injury)   Severe sepsis with septic shock, POA: -Sepsis physiology has resolved. - Complete treatment for bilateral multifocal pneumonia. - Complete course of Zosyn and doxycycline.  Acute respiratory failure with hypoxia in the setting of bilateral multifocal pneumonia: -Complete course of antibiotics. - Continue to assess need for hemodialysis daily, as well as, ultrafiltration.  Diabetes type 2, poorly controlled with hyperglycemia, presented with DKA: - DKA has resolved.  Hypervolemic hyponatremia: - Volume related. - Continue to optimize volume with hemodialysis/ultrafiltration.  Acute kidney injury due to septic ATN: - Patient was initially on CRRT. - Intermittent hemodialysis as needed for now.  Acute metabolic acidosis: - CO2 of 20. - Will likely resolve with hemodialysis.  Demand cardiac ischemia in the setting of septic shock: Shock liver, improving: Anemia and  thrombocytopenia critical illness:   DVT prophylaxis: Subcutaneous heparin . Code Status: Full code. Family Communication:  Disposition Plan: Patient remains inpatient.   Consultants:  Nephrology. Patient was under ICU team  Procedures:  Intermittent hemodialysis today, 05/24/2024. Patient was on CRRT until 05/22/2024.  Antimicrobials:  IV Zosyn Oral doxycycline.   Subjective: No significant history from patient. No new complaints.  Objective: Vitals:   05/24/24 0344 05/24/24 0440 05/24/24 0723 05/24/24 1045  BP: 132/68  (!) 142/67 138/71  Pulse: 97  100   Resp: (!) 23  18   Temp: 98 F (36.7 C)  97.8 F (36.6 C) 98.1 F (36.7 C)  TempSrc: Oral  Oral Oral  SpO2: 94%  94%   Weight:  96 kg    Height:        Intake/Output Summary (Last 24 hours) at 05/24/2024 1157 Last data filed at 05/24/2024 0821 Gross per 24 hour  Intake 530 ml  Output 0 ml  Net 530 ml   Filed Weights   05/22/24 0500 05/23/24 0500 05/24/24 0440  Weight: 96.4 kg 96.1 kg 96 kg    Examination:  General exam: Appears calm and comfortable.  Sleepy. Respiratory system: Clear to auscultation. Respiratory effort normal. Cardiovascular system: S1 & S2, systolic murmur. Gastrointestinal system: Abdomen is obese, soft and nontender. Central nervous system: Sleepy. Extremities: Minimal bilateral ankle edema.  Data Reviewed: I have personally reviewed following labs and imaging studies  CBC: Recent Labs  Lab 05/18/24 0156 05/18/24 0331 05/18/24 1001 05/20/24 0422 05/20/24 0647 05/21/24 0555 05/22/24 0615 05/23/24 0235 05/24/24 0350  WBC 14.3* 13.4*   < > 8.7  --  10.4 11.2* 14.2* 17.9*  NEUTROABS 10.9* 10.5*  --   --   --   --   --   --   --  HGB 13.8 13.8   < > 10.5* 10.2* 9.0* 9.0* 7.6* 7.3*  HCT 40.1 40.8   < > 29.5* 28.8* 25.2* 25.7* 22.1* 21.0*  MCV 69.5* 70.1*   < > 68.0*  --  67.2* 68.0* 68.8* 68.6*  PLT 161 127*   < > PLATELET CLUMPS NOTED ON SMEAR, UNABLE TO ESTIMATE  PLATELET CLUMPS NOTED ON SMEAR, UNABLE TO ESTIMATE 99* 107* 127* 168   < > = values in this interval not displayed.   Basic Metabolic Panel: Recent Labs  Lab 05/18/24 2150 05/19/24 0318 05/21/24 0555 05/21/24 1008 05/21/24 1738 05/21/24 2104 05/22/24 0220 05/22/24 0615 05/22/24 1142 05/22/24 1620 05/23/24 0235 05/23/24 0236 05/24/24 0350  NA  --    < > 132*   < > 131*   < > 134* 132* 133* 135 134* 133* 133*  134*  K  --    < > 4.7   < > 4.8   < > 4.7 4.8 4.9 5.0 4.6 4.6 4.4  4.4  CL  --    < > 96*   < > 97*   < > 99 98 98 96* 98 98 98  99  CO2  --    < > 17*   < > 20*   < > 22 20* 20* 22 21* 20* 20*  20*  GLUCOSE  --    < > 115*   < > 257*   < > 217* 231* 273* 260* 229* 227* 203*  203*  BUN  --    < > 37*   < > 33*   < > 30* 29* 29* 36* 51* 52* 83*  86*  CREATININE  --    < > 4.46*   < > 3.58*   < > 3.06* 2.92* 2.83* 3.31* 4.30* 4.28* 6.64*  6.44*  CALCIUM   --    < > 7.4*   < > 7.7*   < > 7.8* 8.0* 7.9* 7.8* 7.6* 7.6* 7.7*  7.8*  MG 2.4  --  2.6*  --   --   --   --  2.8*  --   --  2.9*  --  2.9*  PHOS  --    < >  --   --  2.8  --  2.5  --   --  3.1  --  2.7 3.2   < > = values in this interval not displayed.   GFR: Estimated Creatinine Clearance: 10.3 mL/min (A) (by C-G formula based on SCr of 6.64 mg/dL (H)). Liver Function Tests: Recent Labs  Lab 05/18/24 0156 05/20/24 1830 05/21/24 1008 05/21/24 1738 05/22/24 1142 05/22/24 1620 05/23/24 0235 05/23/24 0236 05/24/24 0350  AST 233*  --  1,128*  --  997*  --  812*  --   --   ALT 62*  --  192*  --  204*  --  177*  --   --   ALKPHOS 84  --  123  --  166*  --  159*  --   --   BILITOT 0.9  --  1.4*  --  1.0  --  0.9  --   --   PROT 7.3  --  5.6*  --  6.1*  --  5.6*  --   --   ALBUMIN 2.6*   < > 1.7*   < > 1.9* 1.8* 1.7* 1.7* 1.7*   < > = values in this interval not displayed.   No results for input(s): LIPASE,  AMYLASE in the last 168 hours. No results for input(s): AMMONIA in the last 168  hours. Coagulation Profile: Recent Labs  Lab 05/18/24 0156  INR 1.2   Cardiac Enzymes: No results for input(s): CKTOTAL, CKMB, CKMBINDEX, TROPONINI in the last 168 hours. BNP (last 3 results) No results for input(s): PROBNP in the last 8760 hours. HbA1C: No results for input(s): HGBA1C in the last 72 hours. CBG: Recent Labs  Lab 05/23/24 1140 05/23/24 1559 05/23/24 2127 05/24/24 0630 05/24/24 1108  GLUCAP 299* 341* 257* 189* 236*   Lipid Profile: No results for input(s): CHOL, HDL, LDLCALC, TRIG, CHOLHDL, LDLDIRECT in the last 72 hours. Thyroid  Function Tests: No results for input(s): TSH, T4TOTAL, FREET4, T3FREE, THYROIDAB in the last 72 hours. Anemia Panel: Recent Labs    05/24/24 1019  TIBC 284  IRON 46   Urine analysis:    Component Value Date/Time   COLORURINE AMBER (A) 05/18/2024 1635   APPEARANCEUR CLOUDY (A) 05/18/2024 1635   LABSPEC 1.016 05/18/2024 1635   PHURINE 5.0 05/18/2024 1635   GLUCOSEU 150 (A) 05/18/2024 1635   GLUCOSEU >=1000 (A) 02/25/2024 1125   HGBUR LARGE (A) 05/18/2024 1635   BILIRUBINUR NEGATIVE 05/18/2024 1635   BILIRUBINUR Negative 02/25/2024 1117   KETONESUR NEGATIVE 05/18/2024 1635   PROTEINUR >=300 (A) 05/18/2024 1635   UROBILINOGEN 0.2 02/25/2024 1125   UROBILINOGEN 0.2 02/25/2024 1117   NITRITE NEGATIVE 05/18/2024 1635   LEUKOCYTESUR NEGATIVE 05/18/2024 1635   Sepsis Labs: @LABRCNTIP (procalcitonin:4,lacticidven:4)  ) Recent Results (from the past 240 hours)  Resp panel by RT-PCR (RSV, Flu A&B, Covid) Anterior Nasal Swab     Status: None   Collection Time: 05/18/24  1:54 AM   Specimen: Anterior Nasal Swab  Result Value Ref Range Status   SARS Coronavirus 2 by RT PCR NEGATIVE NEGATIVE Final   Influenza A by PCR NEGATIVE NEGATIVE Final   Influenza B by PCR NEGATIVE NEGATIVE Final    Comment: (NOTE) The Xpert Xpress SARS-CoV-2/FLU/RSV plus assay is intended as an aid in the diagnosis of  influenza from Nasopharyngeal swab specimens and should not be used as a sole basis for treatment. Nasal washings and aspirates are unacceptable for Xpert Xpress SARS-CoV-2/FLU/RSV testing.  Fact Sheet for Patients: BloggerCourse.com  Fact Sheet for Healthcare Providers: SeriousBroker.it  This test is not yet approved or cleared by the [place]  FDA and has been authorized for detection and/or diagnosis of SARS-CoV-2 by FDA under an Emergency Use Authorization (EUA). This EUA will remain in effect (meaning this test can be used) for the duration of the COVID-19 declaration under Section 564(b)(1) of the Act, 21 U.S.C. section 360bbb-3(b)(1), unless the authorization is terminated or revoked.     Resp Syncytial Virus by PCR NEGATIVE NEGATIVE Final    Comment: (NOTE) Fact Sheet for Patients: BloggerCourse.com  Fact Sheet for Healthcare Providers: SeriousBroker.it  This test is not yet approved or cleared by the [place]  FDA and has been authorized for detection and/or diagnosis of SARS-CoV-2 by FDA under an Emergency Use Authorization (EUA). This EUA will remain in effect (meaning this test can be used) for the duration of the COVID-19 declaration under Section 564(b)(1) of the Act, 21 U.S.C. section 360bbb-3(b)(1), unless the authorization is terminated or revoked.  Performed at [facility], [address] [place], [place] [address]   Blood Culture (routine x 2)     Status: None   Collection Time: 05/18/24  3:31 AM   Specimen: BLOOD LEFT ARM  Result Value  Ref Range Status   Specimen Description BLOOD LEFT ARM  Final   Special Requests   Final    BOTTLES DRAWN AEROBIC AND ANAEROBIC Blood Culture results may not be optimal due to an inadequate volume of blood received in culture bottles   Culture   Final    NO GROWTH 5 DAYS Performed at [facility], [address], [place], [place] [address]    Report Status 05/23/2024 FINAL  Final  Blood Culture (routine x 2)     Status: None   Collection Time: 05/18/24  3:31 AM   Specimen: BLOOD RIGHT ARM  Result Value Ref Range Status   Specimen Description BLOOD RIGHT ARM  Final   Special Requests   Final    BOTTLES DRAWN AEROBIC AND ANAEROBIC Blood Culture results may not be optimal due to an inadequate volume of blood received in culture bottles   Culture   Final    NO GROWTH 5 DAYS Performed at [facility], [address], [place], [place] [address]    Report Status 05/23/2024 FINAL  Final  MRSA Next Gen by PCR, Nasal     Status: None   Collection Time: 05/18/24 10:25 AM   Specimen: Nasal Mucosa; Nasal Swab  Result Value Ref Range Status   MRSA by PCR Next Gen NOT DETECTED NOT DETECTED Final    Comment: (NOTE) The GeneXpert MRSA Assay (FDA approved for NASAL specimens only), is one component of a comprehensive MRSA colonization surveillance program. It is not intended to diagnose MRSA infection nor to guide or monitor treatment for MRSA infections. Test performance is not FDA approved in patients less than 47 years old. Performed at [facility], [address] [place], [place] [address]   Respiratory (~20 pathogens) panel by PCR     Status: None   Collection Time: 05/18/24 10:25 AM   Specimen: Nasopharyngeal Swab; Respiratory  Result Value Ref Range Status   Adenovirus NOT DETECTED NOT DETECTED Final   Coronavirus 229E NOT DETECTED NOT DETECTED Final    Comment: (NOTE) The Coronavirus on the Respiratory Panel, DOES NOT test for the novel  Coronavirus (2019 nCoV)    Coronavirus HKU1 NOT DETECTED NOT DETECTED Final   Coronavirus NL63 NOT DETECTED NOT DETECTED Final   Coronavirus OC43 NOT DETECTED NOT DETECTED Final   Metapneumovirus NOT DETECTED NOT DETECTED Final   Rhinovirus / Enterovirus NOT DETECTED NOT DETECTED Final   Influenza A NOT DETECTED NOT DETECTED  Final   Influenza B NOT DETECTED NOT DETECTED Final   Parainfluenza Virus 1 NOT DETECTED NOT DETECTED Final   Parainfluenza Virus 2 NOT DETECTED NOT DETECTED Final   Parainfluenza Virus 3 NOT DETECTED NOT DETECTED Final   Parainfluenza Virus 4 NOT DETECTED NOT DETECTED Final   Respiratory Syncytial Virus NOT DETECTED NOT DETECTED Final   Bordetella pertussis NOT DETECTED NOT DETECTED Final   Bordetella Parapertussis NOT DETECTED NOT DETECTED Final   Chlamydophila pneumoniae NOT DETECTED NOT DETECTED Final   Mycoplasma pneumoniae NOT DETECTED NOT DETECTED Final    Comment: Performed at [facility], [address] [place], [place] [address]  C Difficile Quick Screen w PCR reflex     Status: None   Collection Time: 05/18/24  6:32 PM   Specimen: STOOL  Result Value Ref Range Status   C Diff antigen NEGATIVE NEGATIVE Final   C Diff toxin NEGATIVE NEGATIVE Final   C Diff interpretation No C. difficile detected.  Final    Comment: Performed at [facility], [address], [place], [place] [address]  Culture, blood (single) w Reflex to ID Panel     Status: None   Collection Time: 05/19/24  3:18 AM   Specimen: BLOOD RIGHT ARM  Result Value Ref Range Status   Specimen Description BLOOD RIGHT ARM  Final   Special Requests   Final    BOTTLES DRAWN AEROBIC AND ANAEROBIC Blood Culture results may not be optimal due to an inadequate volume of blood received in culture bottles   Culture   Final    NO GROWTH 5 DAYS Performed at [facility], [address] [place], [place] [address]    Report Status 05/24/2024 FINAL  Final         Radiology Studies: No results found.      Scheduled Meds:  (feeding supplement) PROSource Plus  30 mL Oral BID BM   Chlorhexidine Gluconate Cloth  6 each Topical Daily   Chlorhexidine Gluconate Cloth  6 each Topical Q0600   clopidogrel   75 mg Oral Daily   doxycycline  100 mg Oral Q12H   ezetimibe   10 mg Oral Daily   feeding  supplement  237 mL Oral BID BM   Gerhardt's butt cream   Topical Daily   heparin  injection (subcutaneous)  5,000 Units Subcutaneous Q8H   insulin  aspart  0-15 Units Subcutaneous TID WC   insulin  aspart  0-5 Units Subcutaneous QHS   insulin  aspart  8 Units Subcutaneous TID WC   insulin  glargine-yfgn  25 Units Subcutaneous Daily   multivitamin  1 tablet Oral QHS   Continuous Infusions:  piperacillin-tazobactam (ZOSYN)  IV 2.25 g (05/24/24 0842)     LOS: 6 days    Time spent: 55 minutes    Leatrice Chapel, MD  [facility] Pager #: [phone] 7PM-7AM contact night coverage as above

## 2024-05-24 NOTE — Progress Notes (Signed)
 1 liter ultrafiltration, condition stable post hemodialysis, and report was given to the primary RN.

## 2024-05-24 NOTE — Progress Notes (Signed)
 Patient ID: Danielle Flores, female   DOB: 06/25/1966, 58 y.o.   MRN: [id number] S: no new complaints.  Remains anuric. O:BP (!) 142/67 (BP Location: Right Arm)   Pulse 97   Temp 97.8 F (36.6 C) (Oral)   Resp (!) 23   Ht 5' 5 (1.651 m)   Wt 96 kg   LMP  (LMP Unknown)   SpO2 94%   BMI 35.22 kg/m   Intake/Output Summary (Last 24 hours) at 05/24/2024 0935 Last data filed at 05/24/2024 0300 Gross per 24 hour  Intake 295.85 ml  Output 0 ml  Net 295.85 ml   Intake/Output: I/O last 3 completed shifts: In: 686.3 [P.O.:530; IV Piggyback:156.3] Out: 0   Intake/Output this shift:  No intake/output data recorded. Weight change: -0.1 kg Gen: NAD CVS: RRR Resp:CTA Abd: +BS, soft, NT/ND Ext: no edema  Recent Labs  Lab 05/18/24 0156 05/18/24 1001 05/20/24 1830 05/20/24 2155 05/21/24 0215 05/21/24 0555 05/21/24 1008 05/21/24 1334 05/21/24 1738 05/21/24 2104 05/22/24 0220 05/22/24 0615 05/22/24 1142 05/22/24 1620 05/23/24 0235 05/23/24 0236 05/24/24 0350  NA 126*   < > 132*   < > 134*   < > 133*   < > 131*   < > 134* 132* 133* 135 134* 133* 133*  134*  K 2.9*   < > 4.2   < > 4.5   < > 4.5   < > 4.8   < > 4.7 4.8 4.9 5.0 4.6 4.6 4.4  4.4  CL 90*   < > 98   < > 100   < > 99   < > 97*   < > 99 98 98 96* 98 98 98  99  CO2 12*   < > 18*   < > 18*   < > 19*   < > 20*   < > 22 20* 20* 22 21* 20* 20*  20*  GLUCOSE 524*   < > 156*   < > 116*   < > 162*   < > 257*   < > 217* 231* 273* 260* 229* 227* 203*  203*  BUN 18   < > 45*   < > 40*   < > 34*   < > 33*   < > 30* 29* 29* 36* 51* 52* 83*  86*  CREATININE 2.96*   < > 5.67*   < > 4.91*   < > 4.16*   < > 3.58*   < > 3.06* 2.92* 2.83* 3.31* 4.30* 4.28* 6.64*  6.44*  ALBUMIN 2.6*  --  1.6*  --  1.9*  --  1.7*  --  1.9*  --  1.8*  --  1.9* 1.8* 1.7* 1.7* 1.7*  CALCIUM  8.4*   < > 7.1*   < > 7.6*   < > 7.5*   < > 7.7*   < > 7.8* 8.0* 7.9* 7.8* 7.6* 7.6* 7.7*  7.8*  PHOS  --    < > 3.1  --  3.1  --   --   --  2.8  --  2.5  --   --   3.1  --  2.7 3.2  AST 233*  --   --   --   --   --  1,128*  --   --   --   --   --  997*  --  812*  --   --   ALT 62*  --   --   --   --   --  192*  --   --   --   --   --  204*  --  177*  --   --    < > = values in this interval not displayed.   Liver Function Tests: Recent Labs  Lab 05/21/24 1008 05/21/24 1738 05/22/24 1142 05/22/24 1620 05/23/24 0235 05/23/24 0236 05/24/24 0350  AST 1,128*  --  997*  --  812*  --   --   ALT 192*  --  204*  --  177*  --   --   ALKPHOS 123  --  166*  --  159*  --   --   BILITOT 1.4*  --  1.0  --  0.9  --   --   PROT 5.6*  --  6.1*  --  5.6*  --   --   ALBUMIN 1.7*   < > 1.9*   < > 1.7* 1.7* 1.7*   < > = values in this interval not displayed.   No results for input(s): LIPASE, AMYLASE in the last 168 hours. No results for input(s): AMMONIA in the last 168 hours. CBC: Recent Labs  Lab 05/18/24 0156 05/18/24 0331 05/18/24 1001 05/20/24 0422 05/20/24 0647 05/21/24 0555 05/22/24 0615 05/23/24 0235 05/24/24 0350  WBC 14.3* 13.4*   < > 8.7  --  10.4 11.2* 14.2* 17.9*  NEUTROABS 10.9* 10.5*  --   --   --   --   --   --   --   HGB 13.8 13.8   < > 10.5*   < > 9.0* 9.0* 7.6* 7.3*  HCT 40.1 40.8   < > 29.5*   < > 25.2* 25.7* 22.1* 21.0*  MCV 69.5* 70.1*   < > 68.0*  --  67.2* 68.0* 68.8* 68.6*  PLT 161 127*   < > PLATELET CLUMPS NOTED ON SMEAR, UNABLE TO ESTIMATE   < > 99* 107* 127* 168   < > = values in this interval not displayed.   Cardiac Enzymes: No results for input(s): CKTOTAL, CKMB, CKMBINDEX, TROPONINI in the last 168 hours. CBG: Recent Labs  Lab 05/23/24 0824 05/23/24 1140 05/23/24 1559 05/23/24 2127 05/24/24 0630  GLUCAP 310* 299* 341* 257* 189*    Iron Studies: No results for input(s): IRON, TIBC, TRANSFERRIN, FERRITIN in the last 72 hours. Studies/Results: DG Chest Port 1 View Result Date: 05/22/2024 EXAM: 1 VIEW(S) XRAY OF THE CHEST 05/22/2024 10:35:00 AM COMPARISON: 05/20/2024 CLINICAL HISTORY:  pneumonia FINDINGS: LINES, TUBES AND DEVICES: Stable right IJ central venous catheter. LUNGS AND PLEURA: Low lung volumes. Persistent bibasilar airspace opacities, unchanged from prior. HEART AND MEDIASTINUM: Aortic atherosclerosis. BONES AND SOFT TISSUES: No acute osseous abnormality. IMPRESSION: 1. Persistent bibasilar airspace opacities, unchanged from prior. Electronically signed by: Oneil Devonshire MD 05/22/2024 02:25 PM EDT RP Workstation: [id number]    (feeding supplement) PROSource Plus  30 mL Oral BID BM   Chlorhexidine Gluconate Cloth  6 each Topical Daily   clopidogrel   75 mg Oral Daily   doxycycline  100 mg Oral Q12H   ezetimibe   10 mg Oral Daily   feeding supplement  237 mL Oral BID BM   Gerhardt's butt cream   Topical Daily   heparin  injection (subcutaneous)  5,000 Units Subcutaneous Q8H   insulin  aspart  0-15 Units Subcutaneous TID WC   insulin  aspart  0-5 Units Subcutaneous QHS   insulin  aspart  8 Units Subcutaneous TID WC   insulin  glargine-yfgn  25 Units Subcutaneous Daily  multivitamin  1 tablet Oral QHS    BMET    Component Value Date/Time   NA 133 (L) 05/24/2024 0350   NA 134 (L) 05/24/2024 0350   NA 144 12/20/2018 0956   K 4.4 05/24/2024 0350   K 4.4 05/24/2024 0350   CL 98 05/24/2024 0350   CL 99 05/24/2024 0350   CO2 20 (L) 05/24/2024 0350   CO2 20 (L) 05/24/2024 0350   GLUCOSE 203 (H) 05/24/2024 0350   GLUCOSE 203 (H) 05/24/2024 0350   BUN 83 (H) 05/24/2024 0350   BUN 86 (H) 05/24/2024 0350   BUN 13 12/20/2018 0956   CREATININE 6.64 (H) 05/24/2024 0350   CREATININE 6.44 (H) 05/24/2024 0350   CREATININE 1.05 (H) 08/10/2023 1437   CALCIUM  7.7 (L) 05/24/2024 0350   CALCIUM  7.8 (L) 05/24/2024 0350   GFRNONAA 7 (L) 05/24/2024 0350   GFRNONAA 7 (L) 05/24/2024 0350   GFRAA 84 12/20/2018 0956   CBC    Component Value Date/Time   WBC 17.9 (H) 05/24/2024 0350   RBC 3.06 (L) 05/24/2024 0350   HGB 7.3 (L) 05/24/2024 0350   HCT 21.0 (L) 05/24/2024 0350   PLT  168 05/24/2024 0350   MCV 68.6 (L) 05/24/2024 0350   MCH 23.9 (L) 05/24/2024 0350   MCHC 34.8 05/24/2024 0350   RDW 15.0 05/24/2024 0350   LYMPHSABS 2.4 05/18/2024 0331   MONOABS 0.5 05/18/2024 0331   EOSABS 0.0 05/18/2024 0331   BASOSABS 0.0 05/18/2024 0331    Assessment/Plan:  AKI - in setting of DKA.  UA with gluc, blood, and protein.  Negative nitrite and renal US  without obstruction.  Likely ischemic ATN in setting of volume depletion and acute illness as well as hypotension (her Bp dropped to 89/43 earlier yesterday).  Worsening work of breathing and no UOP overnight.  Worsening metabolic acidosis and likely due to AKI and not DKA.  Temp RIJ HD catheter placed by PCCM on 05/20/24 and CRRT initiated.  All fluids 4K/2.5Ca.  goal UF 50-150 mL/hr.  Able to UF 3.7 liters overnight with improved respiratory status.  No improvement of UOP at this time.  Will continue to follow closely and hopefully can transition to IHD in the next 24-48 hours.  CRRT stopped at 12:30 pm on 05/22/24.  Will plan for HD today and can go up to [facility].  Will minimize UF to help prevent worsening ATN and continue to follow for renal recovery. Avoid nephrotoxic medications including NSAIDs and iodinated intravenous contrast exposure unless the latter is absolutely indicated.   Preferred narcotic agents for pain control are hydromorphone, fentanyl , and methadone. Morphine should not be used.  Avoid Baclofen and avoid oral sodium phosphate and magnesium citrate based laxatives / bowel preps.  Continue strict Input and Output monitoring.  Will monitor the patient closely with you and intervene or adjust therapy as indicated by changes in clinical status/labs  Severe sepsis with septic shock due to RML pneumonia - currently on Zosyn and doxycycline.  Given 1 dose of Vancomycin 2 grams upon presentation. Acute hypoxic respiratory failure - improved and now on  Acute NSTEMI - on IV heparin  and being followed by  Cardiology. Hyponatremia - due to DKA and AKI, improved after CRRT Hypokalemia - due to DKA. IMproved after CRRT DKA - off insulin  drip per PCCM Abnormal LFT's - likely shock liver.   Thrombocytopenia - due to acute critical illness.  Improving to 168 AGMA - due to #1 and #7.  Metformin  stopped.  Improved with CRRT  Anemia - likely due to critical illness and AKI.  Will continue to follow and may need to start ESA as she will not accept blood transfusions.  Will check iron stores today. Severe protein malnutrition - alb down to 1.7.  protein supplements per RD.   Fairy RONAL Sellar, MD [facility]

## 2024-05-25 DIAGNOSIS — R6521 Severe sepsis with septic shock: Secondary | ICD-10-CM | POA: Diagnosis not present

## 2024-05-25 DIAGNOSIS — A419 Sepsis, unspecified organism: Secondary | ICD-10-CM | POA: Diagnosis not present

## 2024-05-25 LAB — RENAL FUNCTION PANEL
Albumin: 1.7 g/dL — ABNORMAL LOW (ref 3.5–5.0)
Anion gap: 14 (ref 5–15)
BUN: 58 mg/dL — ABNORMAL HIGH (ref 6–20)
CO2: 23 mmol/L (ref 22–32)
Calcium: 7.7 mg/dL — ABNORMAL LOW (ref 8.9–10.3)
Chloride: 94 mmol/L — ABNORMAL LOW (ref 98–111)
Creatinine, Ser: 5.27 mg/dL — ABNORMAL HIGH (ref 0.44–1.00)
GFR, Estimated: 9 mL/min — ABNORMAL LOW (ref >60)
Glucose, Bld: 227 mg/dL — ABNORMAL HIGH (ref 70–99)
Phosphorus: 3.3 mg/dL (ref 2.5–4.6)
Potassium: 4.6 mmol/L (ref 3.5–5.1)
Sodium: 131 mmol/L — ABNORMAL LOW (ref 135–145)

## 2024-05-25 LAB — CBC
HCT: 20.4 % — ABNORMAL LOW (ref 36.0–46.0)
Hemoglobin: 7.1 g/dL — ABNORMAL LOW (ref 12.0–15.0)
MCH: 24.1 pg — ABNORMAL LOW (ref 26.0–34.0)
MCHC: 34.8 g/dL (ref 30.0–36.0)
MCV: 69.4 fL — ABNORMAL LOW (ref 80.0–100.0)
Platelets: 219 K/uL (ref 150–400)
RBC: 2.94 MIL/uL — ABNORMAL LOW (ref 3.87–5.11)
RDW: 14.7 % (ref 11.5–15.5)
WBC: 17.7 K/uL — ABNORMAL HIGH (ref 4.0–10.5)
nRBC: 0.3 % — ABNORMAL HIGH (ref 0.0–0.2)

## 2024-05-25 LAB — GLUCOSE, CAPILLARY
Glucose-Capillary: 218 mg/dL — ABNORMAL HIGH (ref 70–99)
Glucose-Capillary: 218 mg/dL — ABNORMAL HIGH (ref 70–99)
Glucose-Capillary: 152 mg/dL — ABNORMAL HIGH (ref 70–99)
Glucose-Capillary: 162 mg/dL — ABNORMAL HIGH (ref 70–99)

## 2024-05-25 LAB — MAGNESIUM: Magnesium: 2.4 mg/dL (ref 1.7–2.4)

## 2024-05-25 LAB — URINALYSIS, ROUTINE W REFLEX MICROSCOPIC
Glucose, UA: NEGATIVE mg/dL
Ketones, ur: NEGATIVE mg/dL
Nitrite: NEGATIVE
Protein, ur: >300 mg/dL — AB
Specific Gravity, Urine: 1.025 (ref 1.005–1.030)
pH: 7.5 (ref 5.0–8.0)

## 2024-05-25 LAB — URINALYSIS, MICROSCOPIC (REFLEX)

## 2024-05-25 MED ORDER — BISMUTH SUBSALICYLATE 262 MG/15ML PO SUSP
30.0000 mL | ORAL | Status: DC | PRN
  Administered 2024-05-25 – 2024-05-26 (×2): 30 mL via ORAL
  Filled 2024-05-25: qty 236

## 2024-05-25 MED ORDER — DARBEPOETIN ALFA 60 MCG/0.3ML IJ SOSY
60.0000 ug | PREFILLED_SYRINGE | INTRAMUSCULAR | Status: DC
  Administered 2024-05-25: 60 ug via SUBCUTANEOUS
  Filled 2024-05-25: qty 0.3

## 2024-05-25 NOTE — Plan of Care (Signed)

## 2024-05-25 NOTE — Progress Notes (Signed)
 PROGRESS NOTE    Danielle Flores  FMW:[id number] DOB: Jan 27, 1966 DOA: 05/18/2024 PCP: Antonio Cyndee Jamee JONELLE, DO  Outpatient Specialists:     Brief Narrative:  Patient is a 58 year old female past medical history significant for type 2 diabetes mellitus, DKA, coronary artery disease, hyperglycemia, hyperlipidemia and hypertension.  Patient was initially admitted to the ICU team with DKA, NSTEMI, acute kidney injury, UTI and shortness of breath.  Workup revealed severe sepsis with septic shock that was present on admission, acute kidney injury secondary to septic shock/ATN, initially requiring CRRT.  Patient is not on intermittent hemodialysis as needed.  TRH assumed care today, 05/24/2024.  05/24/2024: Patient was seen on hemodialysis.  Patient is sleeping.  Much history from patient.  1 L ultrafiltration.  Nephrology input is appreciated.  05/25/2024: Patient seen alongside patient's husband.  Patient is awake, alert and able to participate in the history today.  No urine output.  Mild tachycardia, with heart rate in the low 100s noted.  BMP done today reveals sodium of 131, potassium of 4.6, chloride 94, CO2 23, BUN of 58, serum creatinine of 5.27 blood sugar of 227.  Albumin is 1.7.  Last hemodialysis was yesterday, 05/24/2024.  Nephrology team assess his need for hemodialysis daily.   Assessment & Plan:   Principal Problem:   DKA (diabetic ketoacidosis) (HCC) Active Problems:   Sepsis with acute renal failure (HCC)   AKI (acute kidney injury)   Severe sepsis with septic shock, POA: -Sepsis physiology has resolved. - Persistent leukocytosis.  WBC of 17.7 today. - Patient is on doxycycline 100 mg p.o. twice daily. - Patient has completed course of Zosyn for bilateral multifocal pneumonia. - Continue to monitor closely of Zosyn.  Acute respiratory failure with hypoxia in the setting of bilateral multifocal pneumonia: -Completed course of Zosyn.  Patient is still on oral  doxycycline.  - Patient was initially on CRRT (stopped on 05/22/2024).  Need for intermittent hemodialysis is SS daily. - Acute respiratory failure has resolved significantly.    Diabetes type 2, poorly controlled with hyperglycemia, presented with DKA: - DKA has resolved.  Hypervolemic hyponatremia: - Volume related. - Continue to optimize volume with hemodialysis/ultrafiltration.  Acute kidney injury due to septic ATN: - Patient was initially on CRRT. - Intermittent hemodialysis as needed for now. - Last hemodialysis was on 05/24/2024.  Acute metabolic acidosis: - Resolved with hemodialysis. - CO2 of 23 today.    Demand cardiac ischemia in the setting of septic shock: Shock liver, improving: Anemia and thrombocytopenia critical illness:   DVT prophylaxis: Subcutaneous heparin . Code Status: Full code. Family Communication:  Disposition Plan: Patient remains inpatient.   Consultants:  Nephrology. Patient was under ICU team  Procedures:  Last intermittent hemodialysis - 05/24/2024. Patient was on CRRT until 05/22/2024.  Antimicrobials:  IV Zosyn completed. Oral doxycycline.   Subjective: -Patient is awake and alert. - No new complaints. - No shortness of breath or fever.   - Patient reports no urine output.  Objective: Vitals:   05/25/24 0346 05/25/24 0616 05/25/24 0752 05/25/24 1133  BP: 124/66  123/62 (!) 119/59  Pulse: (!) 105  (!) 101   Resp: (!) 23  (!) 23 19  Temp: 98.4 F (36.9 C)  98 F (36.7 C) 97.9 F (36.6 C)  TempSrc: Oral  Oral Oral  SpO2: 92%     Weight:  94.8 kg    Height:        Intake/Output Summary (Last 24 hours) at 05/25/2024 1518 Last data  filed at 05/25/2024 0730 Gross per 24 hour  Intake 560 ml  Output 1000 ml  Net -440 ml   Filed Weights   05/24/24 1534 05/24/24 1537 05/25/24 0616  Weight: 95.1 kg 96.5 kg 94.8 kg    Examination:  General exam: Appears calm and comfortable.  Awake and alert. Respiratory system:  Clear to auscultation. Respiratory effort normal. Cardiovascular system: S1 & S2, systolic murmur. Gastrointestinal system: Abdomen is obese, soft and nontender. Central nervous system: Awake and alert. Extremities: No ankle edema.  Data Reviewed: I have personally reviewed following labs and imaging studies  CBC: Recent Labs  Lab 05/21/24 0555 05/22/24 0615 05/23/24 0235 05/24/24 0350 05/25/24 0340  WBC 10.4 11.2* 14.2* 17.9* 17.7*  HGB 9.0* 9.0* 7.6* 7.3* 7.1*  HCT 25.2* 25.7* 22.1* 21.0* 20.4*  MCV 67.2* 68.0* 68.8* 68.6* 69.4*  PLT 99* 107* 127* 168 219   Basic Metabolic Panel: Recent Labs  Lab 05/21/24 0555 05/21/24 1008 05/22/24 0220 05/22/24 0615 05/22/24 1142 05/22/24 1620 05/23/24 0235 05/23/24 0236 05/24/24 0350 05/25/24 0340  NA 132*   < > 134* 132*   < > 135 134* 133* 133*  134* 131*  K 4.7   < > 4.7 4.8   < > 5.0 4.6 4.6 4.4  4.4 4.6  CL 96*   < > 99 98   < > 96* 98 98 98  99 94*  CO2 17*   < > 22 20*   < > 22 21* 20* 20*  20* 23  GLUCOSE 115*   < > 217* 231*   < > 260* 229* 227* 203*  203* 227*  BUN 37*   < > 30* 29*   < > 36* 51* 52* 83*  86* 58*  CREATININE 4.46*   < > 3.06* 2.92*   < > 3.31* 4.30* 4.28* 6.64*  6.44* 5.27*  CALCIUM  7.4*   < > 7.8* 8.0*   < > 7.8* 7.6* 7.6* 7.7*  7.8* 7.7*  MG 2.6*  --   --  2.8*  --   --  2.9*  --  2.9* 2.4  PHOS  --    < > 2.5  --   --  3.1  --  2.7 3.2 3.3   < > = values in this interval not displayed.   GFR: Estimated Creatinine Clearance: 12.9 mL/min (A) (by C-G formula based on SCr of 5.27 mg/dL (H)). Liver Function Tests: Recent Labs  Lab 05/21/24 1008 05/21/24 1738 05/22/24 1142 05/22/24 1620 05/23/24 0235 05/23/24 0236 05/24/24 0350 05/25/24 0340  AST 1,128*  --  997*  --  812*  --   --   --   ALT 192*  --  204*  --  177*  --   --   --   ALKPHOS 123  --  166*  --  159*  --   --   --   BILITOT 1.4*  --  1.0  --  0.9  --   --   --   PROT 5.6*  --  6.1*  --  5.6*  --   --   --   ALBUMIN  1.7*   < > 1.9* 1.8* 1.7* 1.7* 1.7* 1.7*   < > = values in this interval not displayed.   No results for input(s): LIPASE, AMYLASE in the last 168 hours. No results for input(s): AMMONIA in the last 168 hours. Coagulation Profile: No results for input(s): INR, PROTIME in the last 168 hours.  Cardiac Enzymes: No results for input(s): CKTOTAL, CKMB, CKMBINDEX, TROPONINI in the last 168 hours. BNP (last 3 results) No results for input(s): PROBNP in the last 8760 hours. HbA1C: No results for input(s): HGBA1C in the last 72 hours. CBG: Recent Labs  Lab 05/24/24 0630 05/24/24 1108 05/24/24 2150 05/25/24 0616 05/25/24 1136  GLUCAP 189* 236* 285* 218* 218*   Lipid Profile: No results for input(s): CHOL, HDL, LDLCALC, TRIG, CHOLHDL, LDLDIRECT in the last 72 hours. Thyroid  Function Tests: No results for input(s): TSH, T4TOTAL, FREET4, T3FREE, THYROIDAB in the last 72 hours. Anemia Panel: Recent Labs    05/24/24 1019  TIBC 284  IRON 46   Urine analysis:    Component Value Date/Time   COLORURINE AMBER (A) 05/18/2024 1635   APPEARANCEUR CLOUDY (A) 05/18/2024 1635   LABSPEC 1.016 05/18/2024 1635   PHURINE 5.0 05/18/2024 1635   GLUCOSEU 150 (A) 05/18/2024 1635   GLUCOSEU >=1000 (A) 02/25/2024 1125   HGBUR LARGE (A) 05/18/2024 1635   BILIRUBINUR NEGATIVE 05/18/2024 1635   BILIRUBINUR Negative 02/25/2024 1117   KETONESUR NEGATIVE 05/18/2024 1635   PROTEINUR >=300 (A) 05/18/2024 1635   UROBILINOGEN 0.2 02/25/2024 1125   UROBILINOGEN 0.2 02/25/2024 1117   NITRITE NEGATIVE 05/18/2024 1635   LEUKOCYTESUR NEGATIVE 05/18/2024 1635   Sepsis Labs: @LABRCNTIP (procalcitonin:4,lacticidven:4)  ) Recent Results (from the past 240 hours)  Resp panel by RT-PCR (RSV, Flu A&B, Covid) Anterior Nasal Swab     Status: None   Collection Time: 05/18/24  1:54 AM   Specimen: Anterior Nasal Swab  Result Value Ref Range Status   SARS Coronavirus 2 by RT  PCR NEGATIVE NEGATIVE Final   Influenza A by PCR NEGATIVE NEGATIVE Final   Influenza B by PCR NEGATIVE NEGATIVE Final    Comment: (NOTE) The Xpert Xpress SARS-CoV-2/FLU/RSV plus assay is intended as an aid in the diagnosis of influenza from Nasopharyngeal swab specimens and should not be used as a sole basis for treatment. Nasal washings and aspirates are unacceptable for Xpert Xpress SARS-CoV-2/FLU/RSV testing.  Fact Sheet for Patients: BloggerCourse.com  Fact Sheet for Healthcare Providers: SeriousBroker.it  This test is not yet approved or cleared by the [place]  FDA and has been authorized for detection and/or diagnosis of SARS-CoV-2 by FDA under an Emergency Use Authorization (EUA). This EUA will remain in effect (meaning this test can be used) for the duration of the COVID-19 declaration under Section 564(b)(1) of the Act, 21 U.S.C. section 360bbb-3(b)(1), unless the authorization is terminated or revoked.     Resp Syncytial Virus by PCR NEGATIVE NEGATIVE Final    Comment: (NOTE) Fact Sheet for Patients: BloggerCourse.com  Fact Sheet for Healthcare Providers: SeriousBroker.it  This test is not yet approved or cleared by the [place]  FDA and has been authorized for detection and/or diagnosis of SARS-CoV-2 by FDA under an Emergency Use Authorization (EUA). This EUA will remain in effect (meaning this test can be used) for the duration of the COVID-19 declaration under Section 564(b)(1) of the Act, 21 U.S.C. section 360bbb-3(b)(1), unless the authorization is terminated or revoked.  Performed at [facility], [address] [place], [place] [address]   Blood Culture (routine x 2)     Status: None   Collection Time: 05/18/24  3:31 AM   Specimen: BLOOD LEFT ARM  Result Value Ref Range Status   Specimen Description BLOOD LEFT ARM  Final   Special Requests    Final    BOTTLES DRAWN AEROBIC AND ANAEROBIC Blood Culture  results may not be optimal due to an inadequate volume of blood received in culture bottles   Culture   Final    NO GROWTH 5 DAYS Performed at [facility], [address], [place], [place] [address]    Report Status 05/23/2024 FINAL  Final  Blood Culture (routine x 2)     Status: None   Collection Time: 05/18/24  3:31 AM   Specimen: BLOOD RIGHT ARM  Result Value Ref Range Status   Specimen Description BLOOD RIGHT ARM  Final   Special Requests   Final    BOTTLES DRAWN AEROBIC AND ANAEROBIC Blood Culture results may not be optimal due to an inadequate volume of blood received in culture bottles   Culture   Final    NO GROWTH 5 DAYS Performed at [facility], [address] [place], [place] [address]    Report Status 05/23/2024 FINAL  Final  MRSA Next Gen by PCR, Nasal     Status: None   Collection Time: 05/18/24 10:25 AM   Specimen: Nasal Mucosa; Nasal Swab  Result Value Ref Range Status   MRSA by PCR Next Gen NOT DETECTED NOT DETECTED Final    Comment: (NOTE) The GeneXpert MRSA Assay (FDA approved for NASAL specimens only), is one component of a comprehensive MRSA colonization surveillance program. It is not intended to diagnose MRSA infection nor to guide or monitor treatment for MRSA infections. Test performance is not FDA approved in patients less than 57 years old. Performed at [facility], [address], [place], [place] [address]   Respiratory (~20 pathogens) panel by PCR     Status: None   Collection Time: 05/18/24 10:25 AM   Specimen: Nasopharyngeal Swab; Respiratory  Result Value Ref Range Status   Adenovirus NOT DETECTED NOT DETECTED Final   Coronavirus 229E NOT DETECTED NOT DETECTED Final    Comment: (NOTE) The Coronavirus on the Respiratory Panel, DOES NOT test for the novel  Coronavirus (2019 nCoV)    Coronavirus HKU1 NOT DETECTED NOT DETECTED Final   Coronavirus NL63 NOT  DETECTED NOT DETECTED Final   Coronavirus OC43 NOT DETECTED NOT DETECTED Final   Metapneumovirus NOT DETECTED NOT DETECTED Final   Rhinovirus / Enterovirus NOT DETECTED NOT DETECTED Final   Influenza A NOT DETECTED NOT DETECTED Final   Influenza B NOT DETECTED NOT DETECTED Final   Parainfluenza Virus 1 NOT DETECTED NOT DETECTED Final   Parainfluenza Virus 2 NOT DETECTED NOT DETECTED Final   Parainfluenza Virus 3 NOT DETECTED NOT DETECTED Final   Parainfluenza Virus 4 NOT DETECTED NOT DETECTED Final   Respiratory Syncytial Virus NOT DETECTED NOT DETECTED Final   Bordetella pertussis NOT DETECTED NOT DETECTED Final   Bordetella Parapertussis NOT DETECTED NOT DETECTED Final   Chlamydophila pneumoniae NOT DETECTED NOT DETECTED Final   Mycoplasma pneumoniae NOT DETECTED NOT DETECTED Final    Comment: Performed at [facility], [address] [place], [place] [address]  C Difficile Quick Screen w PCR reflex     Status: None   Collection Time: 05/18/24  6:32 PM   Specimen: STOOL  Result Value Ref Range Status   C Diff antigen NEGATIVE NEGATIVE Final   C Diff toxin NEGATIVE NEGATIVE Final   C Diff interpretation No C. difficile detected.  Final    Comment: Performed at [facility], [address], [place], [place] [address]  Culture, blood (single) w Reflex to ID Panel  Status: None   Collection Time: 05/19/24  3:18 AM   Specimen: BLOOD RIGHT ARM  Result Value Ref Range Status   Specimen Description BLOOD RIGHT ARM  Final   Special Requests   Final    BOTTLES DRAWN AEROBIC AND ANAEROBIC Blood Culture results may not be optimal due to an inadequate volume of blood received in culture bottles   Culture   Final    NO GROWTH 5 DAYS Performed at [facility], [address], [place], [place] [address]    Report Status 05/24/2024 FINAL  Final         Radiology Studies: No results found.      Scheduled Meds:  (feeding supplement) PROSource Plus  30 mL Oral  BID BM   Chlorhexidine Gluconate Cloth  6 each Topical Q0600   clopidogrel   75 mg Oral Daily   darbepoetin (ARANESP) injection - DIALYSIS  60 mcg Subcutaneous Q Sun-1800   doxycycline  100 mg Oral Q12H   ezetimibe   10 mg Oral Daily   feeding supplement  237 mL Oral BID BM   Gerhardt's butt cream   Topical Daily   heparin  injection (subcutaneous)  5,000 Units Subcutaneous Q8H   insulin  aspart  0-15 Units Subcutaneous TID WC   insulin  aspart  0-5 Units Subcutaneous QHS   insulin  aspart  8 Units Subcutaneous TID WC   insulin  glargine-yfgn  25 Units Subcutaneous Daily   multivitamin  1 tablet Oral QHS   Continuous Infusions:     LOS: 7 days    Time spent: 35 minutes    Leatrice Chapel, MD  [facility] Pager #: [phone] 7PM-7AM contact night coverage as above

## 2024-05-25 NOTE — Progress Notes (Signed)
 Patient ID: Danielle Flores, female   DOB: 1966/08/05, 58 y.o.   MRN: [id number] S: No new complaints but was weak after HD yesterday and still weak this morning. O:BP 123/62 (BP Location: Left Arm)   Pulse (!) 101   Temp 98 F (36.7 C) (Oral)   Resp (!) 23   Ht 5' 5 (1.651 m)   Wt 94.8 kg   LMP  (LMP Unknown)   SpO2 92%   BMI 34.80 kg/m   Intake/Output Summary (Last 24 hours) at 05/25/2024 0850 Last data filed at 05/25/2024 0053 Gross per 24 hour  Intake 560 ml  Output 1000 ml  Net -440 ml   Intake/Output: I/O last 3 completed shifts: In: 800 [P.O.:600; IV Piggyback:200] Out: 1000 [Other:1000]  Intake/Output this shift:  No intake/output data recorded. Weight change: 0 kg Gen: NAD CVS: Tachy Resp: CTA Abd: +BS, soft, NT/ND Ext: no edema  Recent Labs  Lab 05/21/24 0215 05/21/24 0555 05/21/24 1008 05/21/24 1334 05/21/24 1738 05/21/24 2104 05/22/24 0220 05/22/24 0615 05/22/24 1142 05/22/24 1620 05/23/24 0235 05/23/24 0236 05/24/24 0350 05/25/24 0340  NA 134*   < > 133*   < > 131*   < > 134* 132* 133* 135 134* 133* 133*  134* 131*  K 4.5   < > 4.5   < > 4.8   < > 4.7 4.8 4.9 5.0 4.6 4.6 4.4  4.4 4.6  CL 100   < > 99   < > 97*   < > 99 98 98 96* 98 98 98  99 94*  CO2 18*   < > 19*   < > 20*   < > 22 20* 20* 22 21* 20* 20*  20* 23  GLUCOSE 116*   < > 162*   < > 257*   < > 217* 231* 273* 260* 229* 227* 203*  203* 227*  BUN 40*   < > 34*   < > 33*   < > 30* 29* 29* 36* 51* 52* 83*  86* 58*  CREATININE 4.91*   < > 4.16*   < > 3.58*   < > 3.06* 2.92* 2.83* 3.31* 4.30* 4.28* 6.64*  6.44* 5.27*  ALBUMIN 1.9*  --  1.7*  --  1.9*  --  1.8*  --  1.9* 1.8* 1.7* 1.7* 1.7* 1.7*  CALCIUM  7.6*   < > 7.5*   < > 7.7*   < > 7.8* 8.0* 7.9* 7.8* 7.6* 7.6* 7.7*  7.8* 7.7*  PHOS 3.1  --   --   --  2.8  --  2.5  --   --  3.1  --  2.7 3.2 3.3  AST  --   --  1,128*  --   --   --   --   --  997*  --  812*  --   --   --   ALT  --   --  192*  --   --   --   --   --  204*  --  177*   --   --   --    < > = values in this interval not displayed.   Liver Function Tests: Recent Labs  Lab 05/21/24 1008 05/21/24 1738 05/22/24 1142 05/22/24 1620 05/23/24 0235 05/23/24 0236 05/24/24 0350 05/25/24 0340  AST 1,128*  --  997*  --  812*  --   --   --   ALT 192*  --  204*  --  177*  --   --   --   ALKPHOS 123  --  166*  --  159*  --   --   --   BILITOT 1.4*  --  1.0  --  0.9  --   --   --   PROT 5.6*  --  6.1*  --  5.6*  --   --   --   ALBUMIN 1.7*   < > 1.9*   < > 1.7* 1.7* 1.7* 1.7*   < > = values in this interval not displayed.   No results for input(s): LIPASE, AMYLASE in the last 168 hours. No results for input(s): AMMONIA in the last 168 hours. CBC: Recent Labs  Lab 05/21/24 0555 05/22/24 0615 05/23/24 0235 05/24/24 0350 05/25/24 0340  WBC 10.4 11.2* 14.2* 17.9* 17.7*  HGB 9.0* 9.0* 7.6* 7.3* 7.1*  HCT 25.2* 25.7* 22.1* 21.0* 20.4*  MCV 67.2* 68.0* 68.8* 68.6* 69.4*  PLT 99* 107* 127* 168 219   Cardiac Enzymes: No results for input(s): CKTOTAL, CKMB, CKMBINDEX, TROPONINI in the last 168 hours. CBG: Recent Labs  Lab 05/23/24 2127 05/24/24 0630 05/24/24 1108 05/24/24 2150 05/25/24 0616  GLUCAP 257* 189* 236* 285* 218*    Iron Studies:  Recent Labs    05/24/24 1019  IRON 46  TIBC 284   Studies/Results: No results found.  (feeding supplement) PROSource Plus  30 mL Oral BID BM   Chlorhexidine Gluconate Cloth  6 each Topical Q0600   clopidogrel   75 mg Oral Daily   doxycycline  100 mg Oral Q12H   ezetimibe   10 mg Oral Daily   feeding supplement  237 mL Oral BID BM   Gerhardt's butt cream   Topical Daily   heparin  injection (subcutaneous)  5,000 Units Subcutaneous Q8H   insulin  aspart  0-15 Units Subcutaneous TID WC   insulin  aspart  0-5 Units Subcutaneous QHS   insulin  aspart  8 Units Subcutaneous TID WC   insulin  glargine-yfgn  25 Units Subcutaneous Daily   multivitamin  1 tablet Oral QHS    BMET    Component Value  Date/Time   NA 131 (L) 05/25/2024 0340   NA 144 12/20/2018 0956   K 4.6 05/25/2024 0340   CL 94 (L) 05/25/2024 0340   CO2 23 05/25/2024 0340   GLUCOSE 227 (H) 05/25/2024 0340   BUN 58 (H) 05/25/2024 0340   BUN 13 12/20/2018 0956   CREATININE 5.27 (H) 05/25/2024 0340   CREATININE 1.05 (H) 08/10/2023 1437   CALCIUM  7.7 (L) 05/25/2024 0340   GFRNONAA 9 (L) 05/25/2024 0340   GFRAA 84 12/20/2018 0956   CBC    Component Value Date/Time   WBC 17.7 (H) 05/25/2024 0340   RBC 2.94 (L) 05/25/2024 0340   HGB 7.1 (L) 05/25/2024 0340   HCT 20.4 (L) 05/25/2024 0340   PLT 219 05/25/2024 0340   MCV 69.4 (L) 05/25/2024 0340   MCH 24.1 (L) 05/25/2024 0340   MCHC 34.8 05/25/2024 0340   RDW 14.7 05/25/2024 0340   LYMPHSABS 2.4 05/18/2024 0331   MONOABS 0.5 05/18/2024 0331   EOSABS 0.0 05/18/2024 0331   BASOSABS 0.0 05/18/2024 0331    Assessment/Plan:  AKI - in setting of DKA.  UA with gluc, blood, and protein.  Negative nitrite and renal US  without obstruction.  Likely ischemic ATN in setting of volume depletion and acute illness as well as hypotension (her Bp dropped to 89/43 earlier yesterday).  Worsening work of breathing and no  UOP overnight.  Worsening metabolic acidosis and likely due to AKI and not DKA.  Temp RIJ HD catheter placed by PCCM on 05/20/24 and CRRT initiated.  All fluids 4K/2.5Ca.  goal UF 50-150 mL/hr.  Able to UF 3.7 liters overnight with improved respiratory status.  No improvement of UOP at this time.  Will continue to follow closely and hopefully can transition to IHD in the next 24-48 hours.  CRRT stopped at 12:30 pm on 05/22/24.  Had a session of IHD on 05/24/24.  Will plan for HD on 05/27/24 and can go up to [facility].  Will minimize UF to help prevent worsening ATN and continue to follow for renal recovery. Avoid nephrotoxic medications including NSAIDs and iodinated intravenous contrast exposure unless the latter is absolutely indicated.   Preferred narcotic agents for pain  control are hydromorphone, fentanyl , and methadone. Morphine should not be used.  Avoid Baclofen and avoid oral sodium phosphate and magnesium citrate based laxatives / bowel preps.  Continue strict Input and Output monitoring.  Will monitor the patient closely with you and intervene or adjust therapy as indicated by changes in clinical status/labs  Severe sepsis with septic shock due to RML pneumonia - currently on Zosyn and doxycycline.  Given 1 dose of Vancomycin 2 grams upon presentation.  WBC remains elevated.  Will remove foley catheter and send UA for cx/sens as well as blood cultures. Acute hypoxic respiratory failure - improved and now on 2L via [place] Acute NSTEMI - on IV heparin  and being followed by Cardiology. Hyponatremia - due to DKA and AKI, improved after CRRT and IHD Hypokalemia - due to DKA. IMproved after CRRT and IHD DKA - off insulin  drip per PCCM Abnormal LFT's - likely shock liver.   Thrombocytopenia - due to acute critical illness.  Improving to 168 AGMA - due to #1 and #7.  Metformin  stopped.  Improved with CRRT and IHD  Anemia - likely due to critical illness and AKI.  Will start ESA as she will not accept blood transfusions.  TSAT 16% and will order IV iron once her infection is under better control. Severe protein malnutrition - alb down to 1.7.  protein supplements per RD.  Fairy RONAL Sellar, MD [facility]

## 2024-05-26 DIAGNOSIS — N179 Acute kidney failure, unspecified: Secondary | ICD-10-CM | POA: Diagnosis not present

## 2024-05-26 LAB — MAGNESIUM: Magnesium: 2.5 mg/dL — ABNORMAL HIGH (ref 1.7–2.4)

## 2024-05-26 LAB — RENAL FUNCTION PANEL
Albumin: 1.7 g/dL — ABNORMAL LOW (ref 3.5–5.0)
Anion gap: 18 — ABNORMAL HIGH (ref 5–15)
BUN: 87 mg/dL — ABNORMAL HIGH (ref 6–20)
CO2: 18 mmol/L — ABNORMAL LOW (ref 22–32)
Calcium: 7.9 mg/dL — ABNORMAL LOW (ref 8.9–10.3)
Chloride: 95 mmol/L — ABNORMAL LOW (ref 98–111)
Creatinine, Ser: 7.2 mg/dL — ABNORMAL HIGH (ref 0.44–1.00)
GFR, Estimated: 6 mL/min — ABNORMAL LOW (ref >60)
Glucose, Bld: 154 mg/dL — ABNORMAL HIGH (ref 70–99)
Phosphorus: 4.6 mg/dL (ref 2.5–4.6)
Potassium: 4.5 mmol/L (ref 3.5–5.1)
Sodium: 131 mmol/L — ABNORMAL LOW (ref 135–145)

## 2024-05-26 LAB — CBC
HCT: 20.8 % — ABNORMAL LOW (ref 36.0–46.0)
Hemoglobin: 7.2 g/dL — ABNORMAL LOW (ref 12.0–15.0)
MCH: 24.2 pg — ABNORMAL LOW (ref 26.0–34.0)
MCHC: 34.6 g/dL (ref 30.0–36.0)
MCV: 70 fL — ABNORMAL LOW (ref 80.0–100.0)
Platelets: 273 K/uL (ref 150–400)
RBC: 2.97 MIL/uL — ABNORMAL LOW (ref 3.87–5.11)
RDW: 15.1 % (ref 11.5–15.5)
WBC: 21.7 K/uL — ABNORMAL HIGH (ref 4.0–10.5)
nRBC: 0.2 % (ref 0.0–0.2)

## 2024-05-26 LAB — HEPATIC FUNCTION PANEL
ALT: 112 U/L — ABNORMAL HIGH (ref 0–44)
AST: 231 U/L — ABNORMAL HIGH (ref 15–41)
Albumin: 1.7 g/dL — ABNORMAL LOW (ref 3.5–5.0)
Alkaline Phosphatase: 157 U/L — ABNORMAL HIGH (ref 38–126)
Bilirubin, Direct: <0.1 mg/dL (ref 0.0–0.2)
Total Bilirubin: 0.8 mg/dL (ref 0.0–1.2)
Total Protein: 5.6 g/dL — ABNORMAL LOW (ref 6.5–8.1)

## 2024-05-26 LAB — GLUCOSE, CAPILLARY
Glucose-Capillary: 116 mg/dL — ABNORMAL HIGH (ref 70–99)
Glucose-Capillary: 169 mg/dL — ABNORMAL HIGH (ref 70–99)
Glucose-Capillary: 225 mg/dL — ABNORMAL HIGH (ref 70–99)
Glucose-Capillary: 188 mg/dL — ABNORMAL HIGH (ref 70–99)
Glucose-Capillary: 161 mg/dL — ABNORMAL HIGH (ref 70–99)

## 2024-05-26 NOTE — Plan of Care (Signed)

## 2024-05-26 NOTE — Progress Notes (Signed)
 PROGRESS NOTE    Danielle Flores  FMW:[id number] DOB: [birth year] DOA: 05/18/2024 PCP: Antonio Cyndee Jamee JONELLE, DO  Outpatient Specialists:     Brief Narrative:  Patient is a 58 year old female past medical history significant for type 2 diabetes mellitus, DKA, coronary artery disease, hyperglycemia, hyperlipidemia and hypertension.  Patient was initially admitted to the ICU team with DKA, NSTEMI, acute kidney injury, UTI and shortness of breath.  Workup revealed severe sepsis with septic shock that was present on admission, acute kidney injury secondary to septic shock/ATN, initially requiring CRRT.  Need for intermittent hemodialysis is assessed on daily basis.  Nephrology plans hemodialysis tomorrow.  Likely TDC placement eventually i.e. when leukocytosis resolved significantly.  Worsening leukocytosis is noted.  Patient is also noted to have diarrhea today.  No fever or chills.  No constitutional symptoms endorsed.  Have a low threshold to consult infectious disease team.  Will send stool sample for analysis.    05/26/2024: Patient seen alongside patient's charge member.  Worsening leukocytosis (from 17,000-21,000).  Diarrhea stools reported.  Send stool for analysis.  Supportive care for now.  Assessment & Plan:   Principal Problem:   DKA (diabetic ketoacidosis) (HCC) Active Problems:   Sepsis with acute renal failure (HCC)   AKI (acute kidney injury)   Severe sepsis with septic shock, POA: -Sepsis physiology has resolved. - Persistent leukocytosis.  WBC of 21.7 today. - Patient is on doxycycline 100 mg p.o. twice daily. - Patient has completed course of Zosyn for bilateral multifocal pneumonia. - Continue to monitor closely off Zosyn. - Send stool sample for analysis. - Urinalysis done on 05/25/2024 revealed small leukocytes, few bacteria, WBC of 21-50, WBC clumps and budding yeast.  Foley catheter has been discontinued. - Send urine for culture.   - Patient has temporary  hemodialysis catheter (neck)  Acute respiratory failure with hypoxia in the setting of bilateral multifocal pneumonia: - Resolved.  Patient is on room air. - Patient has completed course of Zosyn.   -Patient is still on oral doxycycline.  - Patient was initially on CRRT (stopped on 05/22/2024).  Need for intermittent hemodialysis is assessed daily.   Diabetes type 2, poorly controlled with hyperglycemia, presented with DKA: - DKA has resolved. - Patient is currently on subcutaneous Semglee  25 units daily, subcutaneous NovoLog  8 units with meals 3 times daily and sliding scale insulin  coverage. - A1c of 9.9%.  Hypervolemic hyponatremia: - Likely volume related. - Continue to optimize volume with hemodialysis/ultrafiltration.  Acute kidney injury due to septic ATN: - Patient was initially on CRRT. - Intermittent hemodialysis as needed for now. - Last hemodialysis was on 05/24/2024. - Likely hemodialysis tomorrow, 05/27/2024. - Suspect very decreased urine output. - BMP done today reveals BUN of 87 and serum creatinine of 7.2. - Likely TDC placement after leukocytosis resolves.  Acute metabolic acidosis: - Related to acute kidney injury  Demand cardiac ischemia in the setting of septic shock: Shock liver, improving: Anemia and thrombocytopenia critical illness:   DVT prophylaxis: Subcutaneous heparin . Code Status: Full code. Family Communication:  Disposition Plan: Patient remains inpatient.   Consultants:  Nephrology. Patient was under ICU team  Procedures:  Last intermittent hemodialysis - 05/24/2024. Patient was on CRRT until 05/22/2024.  Antimicrobials:  IV Zosyn completed. Oral doxycycline.   Subjective: -Patient is awake and alert. - Diarrhea still is noted. - No shortness of breath or fever.   - No significant urine output  Objective: Vitals:   05/26/24 1108 05/26/24 1200 05/26/24 1203 05/26/24  1225  BP: (!) 127/47     Pulse: (!) 116  (!) 117 (!) 132   Resp: 19     Temp: 97.7 F (36.5 C)     TempSrc: Oral     SpO2: 93% 93%    Weight:      Height:        Intake/Output Summary (Last 24 hours) at 05/26/2024 1602 Last data filed at 05/25/2024 2200 Gross per 24 hour  Intake 440 ml  Output --  Net 440 ml   Filed Weights   05/24/24 1537 05/25/24 0616 05/26/24 0445  Weight: 96.5 kg 94.8 kg 95.6 kg    Examination:  General exam: Appears calm and comfortable.  Awake and alert. Respiratory system: Clear to auscultation. Respiratory effort normal. Cardiovascular system: S1 & S2, systolic murmur. Gastrointestinal system: Abdomen is obese, soft and nontender. Central nervous system: Awake and alert. Extremities: No ankle edema.  Data Reviewed: I have personally reviewed following labs and imaging studies  CBC: Recent Labs  Lab 05/22/24 0615 05/23/24 0235 05/24/24 0350 05/25/24 0340 05/26/24 0417  WBC 11.2* 14.2* 17.9* 17.7* 21.7*  HGB 9.0* 7.6* 7.3* 7.1* 7.2*  HCT 25.7* 22.1* 21.0* 20.4* 20.8*  MCV 68.0* 68.8* 68.6* 69.4* 70.0*  PLT 107* 127* 168 219 273   Basic Metabolic Panel: Recent Labs  Lab 05/22/24 0615 05/22/24 1142 05/22/24 1620 05/23/24 0235 05/23/24 0236 05/24/24 0350 05/25/24 0340 05/26/24 0417  NA 132*   < > 135 134* 133* 133*  134* 131* 131*  K 4.8   < > 5.0 4.6 4.6 4.4  4.4 4.6 4.5  CL 98   < > 96* 98 98 98  99 94* 95*  CO2 20*   < > 22 21* 20* 20*  20* 23 18*  GLUCOSE 231*   < > 260* 229* 227* 203*  203* 227* 154*  BUN 29*   < > 36* 51* 52* 83*  86* 58* 87*  CREATININE 2.92*   < > 3.31* 4.30* 4.28* 6.64*  6.44* 5.27* 7.20*  CALCIUM  8.0*   < > 7.8* 7.6* 7.6* 7.7*  7.8* 7.7* 7.9*  MG 2.8*  --   --  2.9*  --  2.9* 2.4 2.5*  PHOS  --   --  3.1  --  2.7 3.2 3.3 4.6   < > = values in this interval not displayed.   GFR: Estimated Creatinine Clearance: 9.5 mL/min (A) (by C-G formula based on SCr of 7.2 mg/dL (H)). Liver Function Tests: Recent Labs  Lab 05/21/24 1008 05/21/24 1738  05/22/24 1142 05/22/24 1620 05/23/24 0235 05/23/24 0236 05/24/24 0350 05/25/24 0340 05/26/24 0417  AST 1,128*  --  997*  --  812*  --   --   --  231*  ALT 192*  --  204*  --  177*  --   --   --  112*  ALKPHOS 123  --  166*  --  159*  --   --   --  157*  BILITOT 1.4*  --  1.0  --  0.9  --   --   --  0.8  PROT 5.6*  --  6.1*  --  5.6*  --   --   --  5.6*  ALBUMIN 1.7*   < > 1.9*   < > 1.7* 1.7* 1.7* 1.7* 1.7*  1.7*   < > = values in this interval not displayed.   No results for input(s): LIPASE, AMYLASE in the last 168  hours. No results for input(s): AMMONIA in the last 168 hours. Coagulation Profile: No results for input(s): INR, PROTIME in the last 168 hours.  Cardiac Enzymes: No results for input(s): CKTOTAL, CKMB, CKMBINDEX, TROPONINI in the last 168 hours. BNP (last 3 results) No results for input(s): PROBNP in the last 8760 hours. HbA1C: No results for input(s): HGBA1C in the last 72 hours. CBG: Recent Labs  Lab 05/25/24 1136 05/25/24 1617 05/25/24 2152 05/26/24 0655 05/26/24 1114  GLUCAP 218* 152* 162* 169* 225*   Lipid Profile: No results for input(s): CHOL, HDL, LDLCALC, TRIG, CHOLHDL, LDLDIRECT in the last 72 hours. Thyroid  Function Tests: No results for input(s): TSH, T4TOTAL, FREET4, T3FREE, THYROIDAB in the last 72 hours. Anemia Panel: Recent Labs    05/24/24 1019  TIBC 284  IRON 46   Urine analysis:    Component Value Date/Time   COLORURINE YELLOW 05/25/2024 1448   APPEARANCEUR CLOUDY (A) 05/25/2024 1448   LABSPEC 1.025 05/25/2024 1448   PHURINE 7.5 05/25/2024 1448   GLUCOSEU NEGATIVE 05/25/2024 1448   GLUCOSEU >=1000 (A) 02/25/2024 1125   HGBUR LARGE (A) 05/25/2024 1448   BILIRUBINUR MODERATE (A) 05/25/2024 1448   BILIRUBINUR Negative 02/25/2024 1117   KETONESUR NEGATIVE 05/25/2024 1448   PROTEINUR >300 (A) 05/25/2024 1448   UROBILINOGEN 0.2 02/25/2024 1125   UROBILINOGEN 0.2 02/25/2024 1117    NITRITE NEGATIVE 05/25/2024 1448   LEUKOCYTESUR SMALL (A) 05/25/2024 1448   Sepsis Labs: @LABRCNTIP (procalcitonin:4,lacticidven:4)  ) Recent Results (from the past 240 hours)  Resp panel by RT-PCR (RSV, Flu A&B, Covid) Anterior Nasal Swab     Status: None   Collection Time: 05/18/24  1:54 AM   Specimen: Anterior Nasal Swab  Result Value Ref Range Status   SARS Coronavirus 2 by RT PCR NEGATIVE NEGATIVE Final   Influenza A by PCR NEGATIVE NEGATIVE Final   Influenza B by PCR NEGATIVE NEGATIVE Final    Comment: (NOTE) The Xpert Xpress SARS-CoV-2/FLU/RSV plus assay is intended as an aid in the diagnosis of influenza from Nasopharyngeal swab specimens and should not be used as a sole basis for treatment. Nasal washings and aspirates are unacceptable for Xpert Xpress SARS-CoV-2/FLU/RSV testing.  Fact Sheet for Patients: BloggerCourse.com  Fact Sheet for Healthcare Providers: SeriousBroker.it  This test is not yet approved or cleared by the [place]  FDA and has been authorized for detection and/or diagnosis of SARS-CoV-2 by FDA under an Emergency Use Authorization (EUA). This EUA will remain in effect (meaning this test can be used) for the duration of the COVID-19 declaration under Section 564(b)(1) of the Act, 21 U.S.C. section 360bbb-3(b)(1), unless the authorization is terminated or revoked.     Resp Syncytial Virus by PCR NEGATIVE NEGATIVE Final    Comment: (NOTE) Fact Sheet for Patients: BloggerCourse.com  Fact Sheet for Healthcare Providers: SeriousBroker.it  This test is not yet approved or cleared by the [place]  FDA and has been authorized for detection and/or diagnosis of SARS-CoV-2 by FDA under an Emergency Use Authorization (EUA). This EUA will remain in effect (meaning this test can be used) for the duration of the COVID-19 declaration under Section  564(b)(1) of the Act, 21 U.S.C. section 360bbb-3(b)(1), unless the authorization is terminated or revoked.  Performed at [facility], [address] [place], [place] [address]   Blood Culture (routine x 2)     Status: None   Collection Time: 05/18/24  3:31 AM   Specimen: BLOOD LEFT ARM  Result Value Ref Range Status  Specimen Description BLOOD LEFT ARM  Final   Special Requests   Final    BOTTLES DRAWN AEROBIC AND ANAEROBIC Blood Culture results may not be optimal due to an inadequate volume of blood received in culture bottles   Culture   Final    NO GROWTH 5 DAYS Performed at [facility], [address], [place], [place] [address]    Report Status 05/23/2024 FINAL  Final  Blood Culture (routine x 2)     Status: None   Collection Time: 05/18/24  3:31 AM   Specimen: BLOOD RIGHT ARM  Result Value Ref Range Status   Specimen Description BLOOD RIGHT ARM  Final   Special Requests   Final    BOTTLES DRAWN AEROBIC AND ANAEROBIC Blood Culture results may not be optimal due to an inadequate volume of blood received in culture bottles   Culture   Final    NO GROWTH 5 DAYS Performed at [facility], [address] [place], [place] [address]    Report Status 05/23/2024 FINAL  Final  MRSA Next Gen by PCR, Nasal     Status: None   Collection Time: 05/18/24 10:25 AM   Specimen: Nasal Mucosa; Nasal Swab  Result Value Ref Range Status   MRSA by PCR Next Gen NOT DETECTED NOT DETECTED Final    Comment: (NOTE) The GeneXpert MRSA Assay (FDA approved for NASAL specimens only), is one component of a comprehensive MRSA colonization surveillance program. It is not intended to diagnose MRSA infection nor to guide or monitor treatment for MRSA infections. Test performance is not FDA approved in patients less than 50 years old. Performed at [facility], [address], [place], [place] [address]   Respiratory (~20 pathogens) panel by PCR     Status: None   Collection  Time: 05/18/24 10:25 AM   Specimen: Nasopharyngeal Swab; Respiratory  Result Value Ref Range Status   Adenovirus NOT DETECTED NOT DETECTED Final   Coronavirus 229E NOT DETECTED NOT DETECTED Final    Comment: (NOTE) The Coronavirus on the Respiratory Panel, DOES NOT test for the novel  Coronavirus (2019 nCoV)    Coronavirus HKU1 NOT DETECTED NOT DETECTED Final   Coronavirus NL63 NOT DETECTED NOT DETECTED Final   Coronavirus OC43 NOT DETECTED NOT DETECTED Final   Metapneumovirus NOT DETECTED NOT DETECTED Final   Rhinovirus / Enterovirus NOT DETECTED NOT DETECTED Final   Influenza A NOT DETECTED NOT DETECTED Final   Influenza B NOT DETECTED NOT DETECTED Final   Parainfluenza Virus 1 NOT DETECTED NOT DETECTED Final   Parainfluenza Virus 2 NOT DETECTED NOT DETECTED Final   Parainfluenza Virus 3 NOT DETECTED NOT DETECTED Final   Parainfluenza Virus 4 NOT DETECTED NOT DETECTED Final   Respiratory Syncytial Virus NOT DETECTED NOT DETECTED Final   Bordetella pertussis NOT DETECTED NOT DETECTED Final   Bordetella Parapertussis NOT DETECTED NOT DETECTED Final   Chlamydophila pneumoniae NOT DETECTED NOT DETECTED Final   Mycoplasma pneumoniae NOT DETECTED NOT DETECTED Final    Comment: Performed at [facility], [address] [place], [place] [address]  C Difficile Quick Screen w PCR reflex     Status: None   Collection Time: 05/18/24  6:32 PM   Specimen: Stool  Result Value Ref Range Status   C Diff antigen NEGATIVE NEGATIVE Final   C Diff toxin NEGATIVE NEGATIVE Final   C Diff interpretation No C. difficile detected.  Final    Comment: Performed  at [facility], [address], [place], [place] [address]  Culture, blood (single) w Reflex to ID Panel     Status: None   Collection Time: 05/19/24  3:18 AM   Specimen: BLOOD RIGHT ARM  Result Value Ref Range Status   Specimen Description BLOOD RIGHT ARM  Final   Special Requests   Final    BOTTLES DRAWN AEROBIC AND ANAEROBIC  Blood Culture results may not be optimal due to an inadequate volume of blood received in culture bottles   Culture   Final    NO GROWTH 5 DAYS Performed at [facility], [address] [place], [place] [address]    Report Status 05/24/2024 FINAL  Final  Culture, blood (Routine X 2) w Reflex to ID Panel     Status: None (Preliminary result)   Collection Time: 05/25/24  4:36 PM   Specimen: BLOOD RIGHT ARM  Result Value Ref Range Status   Specimen Description BLOOD RIGHT ARM  Final   Special Requests   Final    BOTTLES DRAWN AEROBIC AND ANAEROBIC Blood Culture results may not be optimal due to an inadequate volume of blood received in culture bottles   Culture   Final    NO GROWTH < 24 HOURS Performed at [facility], [address] [place], [place] [address]    Report Status PENDING  Incomplete  Culture, blood (Routine X 2) w Reflex to ID Panel     Status: None (Preliminary result)   Collection Time: 05/25/24  4:38 PM   Specimen: BLOOD LEFT ARM  Result Value Ref Range Status   Specimen Description BLOOD LEFT ARM  Final   Special Requests   Final    BOTTLES DRAWN AEROBIC AND ANAEROBIC Blood Culture adequate volume   Culture   Final    NO GROWTH < 24 HOURS Performed at [facility], [address], [place], [place] [address]    Report Status PENDING  Incomplete         Radiology Studies: No results found.      Scheduled Meds:  (feeding supplement) PROSource Plus  30 mL Oral BID BM   Chlorhexidine Gluconate Cloth  6 each Topical Q0600   clopidogrel   75 mg Oral Daily   darbepoetin (ARANESP) injection - DIALYSIS  60 mcg Subcutaneous Q Sun-1800   doxycycline  100 mg Oral Q12H   ezetimibe   10 mg Oral Daily   feeding supplement  237 mL Oral BID BM   Gerhardt's butt cream   Topical Daily   heparin  injection (subcutaneous)  5,000 Units Subcutaneous Q8H   insulin  aspart  0-15 Units Subcutaneous TID WC   insulin  aspart  0-5 Units Subcutaneous QHS    insulin  aspart  8 Units Subcutaneous TID WC   insulin  glargine-yfgn  25 Units Subcutaneous Daily   multivitamin  1 tablet Oral QHS   Continuous Infusions:     LOS: 8 days    Time spent: 35 minutes    Leatrice Chapel, MD  [facility] Pager #: [phone] 7PM-7AM contact night coverage as above

## 2024-05-26 NOTE — Progress Notes (Signed)
 [facility] Progress Note    Assessment/ Plan:    AKI - in setting of DKA.  UA with gluc, blood, and protein.  Negative nitrite and renal US  without obstruction.  Likely ischemic ATN in setting of volume depletion and acute illness as well as hypotension. S/p CRRT via temp line from 10/14-10/16/25. Transitioned to HD on 10/18 Plan for HD 05/27/24 (can go up to [facility]). No signs of renal recovery at this junction. Will consult IR for [facility] placement once WBC improves Avoid nephrotoxic medications including NSAIDs and iodinated intravenous contrast exposure unless the latter is absolutely indicated.  Preferred narcotic agents for pain control are hydromorphone, fentanyl , and methadone. Morphine should not be used. Avoid Baclofen and avoid oral sodium phosphate and magnesium citrate based laxatives / bowel preps. Continue strict Input and Output monitoring. Will monitor the patient closely with you and intervene or adjust therapy as indicated by changes in clinical status/labs    Severe sepsis with septic shock due to RML pneumonia - currently on Zosyn and doxycycline.  Per primary service Acute hypoxic respiratory failure - improved and now on 2L via [place] Acute NSTEMI - on plavix  and being followed by Cardiology. Hyponatremia - due to DKA and AKI, will attempt to manage with HD Hypokalemia - due to DKA. improved DKA - off insulin  drip per primary service Abnormal LFT's - likely shock liver.  Per primary Thrombocytopenia - due to acute critical illness.  Stable 154 AGMA - due to #1 and #7.  Metformin  stopped.  Improved with renal replacement therapy  Anemia - likely due to critical illness and AKI.  She will not accept blood transfusions.  TSAT 16% and will order IV iron once her infection is under better control. Hgb stable, on ESA Severe protein malnutrition - alb down to 1.7.  protein supplements per RD.  Subjective:   Patient seen and examined bedside. No acute events. She reports  that she urinated twice in the last 24 hrs. Denies any chest pain, SOB, swelling   Objective:   BP (!) 141/65 (BP Location: Left Arm) Comment: taken twice  Pulse (!) 112   Temp 97.8 F (36.6 C) (Oral)   Resp 20   Ht 5' 5 (1.651 m)   Wt 95.6 kg   LMP  (LMP Unknown)   SpO2 94%   BMI 35.07 kg/m   Intake/Output Summary (Last 24 hours) at 05/26/2024 0819 Last data filed at 05/25/2024 2200 Gross per 24 hour  Intake 680 ml  Output --  Net 680 ml   Weight change: -0.4 kg  Physical Exam: Gen: NAD, laying flat in bed CVS: RRR Resp: CTA B/L Jai:dnqu, nt/nd Ext: no edema Neuro: awake, alert Dialysis access: RIJ temp HD catheter c/d/i  Imaging: No results found.  Labs: BMET Recent Labs  Lab 05/21/24 1738 05/21/24 2104 05/22/24 0220 05/22/24 9384 05/22/24 1142 05/22/24 1620 05/23/24 0235 05/23/24 0236 05/24/24 0350 05/25/24 0340 05/26/24 0417  NA 131*   < > 134*   < > 133* 135 134* 133* 133*  134* 131* 131*  K 4.8   < > 4.7   < > 4.9 5.0 4.6 4.6 4.4  4.4 4.6 4.5  CL 97*   < > 99   < > 98 96* 98 98 98  99 94* 95*  CO2 20*   < > 22   < > 20* 22 21* 20* 20*  20* 23 18*  GLUCOSE 257*   < > 217*   < >  273* 260* 229* 227* 203*  203* 227* 154*  BUN 33*   < > 30*   < > 29* 36* 51* 52* 83*  86* 58* 87*  CREATININE 3.58*   < > 3.06*   < > 2.83* 3.31* 4.30* 4.28* 6.64*  6.44* 5.27* 7.20*  CALCIUM  7.7*   < > 7.8*   < > 7.9* 7.8* 7.6* 7.6* 7.7*  7.8* 7.7* 7.9*  PHOS 2.8  --  2.5  --   --  3.1  --  2.7 3.2 3.3 4.6   < > = values in this interval not displayed.   CBC Recent Labs  Lab 05/23/24 0235 05/24/24 0350 05/25/24 0340 05/26/24 0417  WBC 14.2* 17.9* 17.7* 21.7*  HGB 7.6* 7.3* 7.1* 7.2*  HCT 22.1* 21.0* 20.4* 20.8*  MCV 68.8* 68.6* 69.4* 70.0*  PLT 127* 168 219 273    Medications:     (feeding supplement) PROSource Plus  30 mL Oral BID BM   Chlorhexidine Gluconate Cloth  6 each Topical Q0600   clopidogrel   75 mg Oral Daily   darbepoetin (ARANESP)  injection - DIALYSIS  60 mcg Subcutaneous Q Sun-1800   doxycycline  100 mg Oral Q12H   ezetimibe   10 mg Oral Daily   feeding supplement  237 mL Oral BID BM   Gerhardt's butt cream   Topical Daily   heparin  injection (subcutaneous)  5,000 Units Subcutaneous Q8H   insulin  aspart  0-15 Units Subcutaneous TID WC   insulin  aspart  0-5 Units Subcutaneous QHS   insulin  aspart  8 Units Subcutaneous TID WC   insulin  glargine-yfgn  25 Units Subcutaneous Daily   multivitamin  1 tablet Oral QHS      Ephriam Stank, MD [facility] 05/26/2024, 8:19 AM

## 2024-05-26 NOTE — Progress Notes (Signed)
 Patient had bowel movement this morning, patient using bed pan d/t extreme weakness and fatigue. Patient had bright red discharge on bed pad and in bedpan after bowel movement, stool is brown. Vaginal area bleeding with several clots. Patient is postmenopausal.

## 2024-05-26 NOTE — Progress Notes (Signed)
 Physical Therapy Treatment Patient Details Name: Danielle Flores MRN: [id number] DOB: Jan 03, 1966 Today's Date: 05/26/2024   History of Present Illness 58 yo presenting to [facility] on 10/12 due to fever, rhinorrhea, congestion, SOB progressing over the past 3 days. Also with nausea and vomiting for 2 days.  Pt found to be in acute respiratory failure with hypoxia in setting of bil multifocal pneumonia and severe sepsis with septic shock; complicated by AKI due to septic NSTEMI and DKA. Pt on CRRT off starting 10/16  PMH: DM II, hyperlipidemia, CAD.    PT Comments  Pt admitted with above diagnosis. Pt progressing although slowly with pt continuing to need RW and +2 assist with chair follow. Pt and friend state that husband is lining up care for pt and pt would benefit from post acute rehab > 3 hours day. Will follow acutely.  Pt currently with functional limitations due to the deficits listed below (see PT Problem List). Pt will benefit from acute skilled PT to increase their independence and safety with mobility to allow discharge.       If plan is discharge home, recommend the following: Assist for transportation;Assistance with cooking/housework;Help with stairs or ramp for entrance;A little help with walking and/or transfers   Can travel by private vehicle        Equipment Recommendations  Wheelchair (measurements PT);Wheelchair cushion (measurements PT);Rolling walker (2 wheels);BSC/3in1    Recommendations for Other Services Rehab consult     Precautions / Restrictions Precautions Precautions: Fall Recall of Precautions/Restrictions: Intact Restrictions Weight Bearing Restrictions Per Provider Order: No     Mobility  Bed Mobility Overal bed mobility: Needs Assistance Bed Mobility: Supine to Sit, Rolling Rolling: Min assist   Supine to sit: Min assist, +2 for safety/equipment     General bed mobility comments: Needed assist and cues to roll over and sit up.  Incr time as well.     Transfers Overall transfer level: Needs assistance Equipment used: None Transfers: Sit to/from Stand Sit to Stand: Min assist, +2 safety/equipment   Step pivot transfers: Min assist, +2 safety/equipment       General transfer comment: cues for hand placement and min assist to come to standing from bed.  Unsteady initially requiring incr assist and cues.    Ambulation/Gait Ambulation/Gait assistance: Min assist, +2 safety/equipment Gait Distance (Feet): 25 Feet Assistive device: Rolling walker (2 wheels) Gait Pattern/deviations: Step-through pattern, Decreased stride length, Decreased step length - right, Decreased step length - left, Shuffle, Drifts right/left, Trunk flexed   Gait velocity interpretation: <1.31 ft/sec, indicative of household ambulator   General Gait Details: Pt was able to ambulate with rW short distance. Pt needing min assist of 2 for stability.    Pt with unsteadiness bil LEs with no knee buckling that required min assist for safety with slow and cautious gait and neeeded assist to steer RW.  Had to have chair follow.   Stairs             Wheelchair Mobility     Tilt Bed    Modified Rankin (Stroke Patients Only)       Balance Overall balance assessment: Needs assistance Sitting-balance support: No upper extremity supported, Feet supported Sitting balance-Leahy Scale: Fair     Standing balance support: Bilateral upper extremity supported, During functional activity, Reliant on assistive device for balance Standing balance-Leahy Scale: Poor Standing balance comment: Min A for balance in standing with bil UE suport on RW and external support by therapist  Communication Communication Communication: No apparent difficulties  Cognition Arousal: Lethargic, Alert Behavior During Therapy: WFL for tasks assessed/performed   PT - Cognitive impairments: No apparent impairments                          Following commands: Intact      Cueing Cueing Techniques: Verbal cues  Exercises Other Exercises Other Exercises: AROM bil UE x10 reps, ankle pumps x10 , hip flexion x10 reps, knee extension 10 reps    General Comments General comments (skin integrity, edema, etc.): Pt on 2LO2 on arrival with sats 92%.  Removed O2 and sats to 90% after a few min, Once pt began to ambulate, desaturate to 85% therefore placed pt up to 4LO2 to keep sats >88%.  H R 117-132 bpm, Supine 127/47; sitting 133/61, 160/65 after walk.  End ofrx 127/71.      Pertinent Vitals/Pain Pain Assessment Pain Assessment: Faces Faces Pain Scale: Hurts a little bit Pain Location: generalized Pain Descriptors / Indicators: Discomfort Pain Intervention(s): Limited activity within patient's tolerance, Monitored during session, Repositioned    Home Living                          Prior Function            PT Goals (current goals can now be found in the care plan section) Acute Rehab PT Goals Patient Stated Goal: to return to home and work Progress towards PT goals: Progressing toward goals    Frequency    Min 2X/week      PT Plan      Co-evaluation              AM-PAC PT 6 Clicks Mobility   Outcome Measure  Help needed turning from your back to your side while in a flat bed without using bedrails?: A Little Help needed moving from lying on your back to sitting on the side of a flat bed without using bedrails?: A Little Help needed moving to and from a bed to a chair (including a wheelchair)?: A Little Help needed standing up from a chair using your arms (e.g., wheelchair or bedside chair)?: A Lot Help needed to walk in hospital room?: Total Help needed climbing 3-5 steps with a railing? : Total 6 Click Score: 13    End of Session Equipment Utilized During Treatment: Gait belt;Oxygen Activity Tolerance: Patient limited by fatigue Patient left: in chair;with call bell/phone within  reach;with chair alarm set;with family/visitor present Nurse Communication: Mobility status PT Visit Diagnosis: Unsteadiness on feet (R26.81);Other abnormalities of gait and mobility (R26.89);Muscle weakness (generalized) (M62.81)     Time: 8796-8768 PT Time Calculation (min) (ACUTE ONLY): 28 min  Charges:    $Gait Training: 8-22 mins $Therapeutic Exercise: 8-22 mins PT General Charges $$ ACUTE PT VISIT: 1 Visit                     Nikitia Asbill M,PT Acute Rehab Services [phone]    Stephane JULIANNA Bevel 05/26/2024, 3:55 PM

## 2024-05-27 ENCOUNTER — Inpatient Hospital Stay (HOSPITAL_COMMUNITY)

## 2024-05-27 DIAGNOSIS — N179 Acute kidney failure, unspecified: Secondary | ICD-10-CM | POA: Diagnosis not present

## 2024-05-27 DIAGNOSIS — E1110 Type 2 diabetes mellitus with ketoacidosis without coma: Secondary | ICD-10-CM | POA: Diagnosis not present

## 2024-05-27 DIAGNOSIS — I2489 Other forms of acute ischemic heart disease: Secondary | ICD-10-CM | POA: Diagnosis not present

## 2024-05-27 LAB — GASTROINTESTINAL PANEL BY PCR, STOOL (REPLACES STOOL CULTURE)

## 2024-05-27 LAB — GLUCOSE, CAPILLARY
Glucose-Capillary: 168 mg/dL — ABNORMAL HIGH (ref 70–99)
Glucose-Capillary: 229 mg/dL — ABNORMAL HIGH (ref 70–99)
Glucose-Capillary: 202 mg/dL — ABNORMAL HIGH (ref 70–99)
Glucose-Capillary: 136 mg/dL — ABNORMAL HIGH (ref 70–99)
Glucose-Capillary: 102 mg/dL — ABNORMAL HIGH (ref 70–99)

## 2024-05-27 LAB — RENAL FUNCTION PANEL
Albumin: 1.7 g/dL — ABNORMAL LOW (ref 3.5–5.0)
Anion gap: 19 — ABNORMAL HIGH (ref 5–15)
BUN: 124 mg/dL — ABNORMAL HIGH (ref 6–20)
CO2: 17 mmol/L — ABNORMAL LOW (ref 22–32)
Calcium: 8.2 mg/dL — ABNORMAL LOW (ref 8.9–10.3)
Chloride: 97 mmol/L — ABNORMAL LOW (ref 98–111)
Creatinine, Ser: 9.3 mg/dL — ABNORMAL HIGH (ref 0.44–1.00)
GFR, Estimated: 4 mL/min — ABNORMAL LOW (ref >60)
Glucose, Bld: 166 mg/dL — ABNORMAL HIGH (ref 70–99)
Phosphorus: 6.6 mg/dL — ABNORMAL HIGH (ref 2.5–4.6)
Potassium: 4.7 mmol/L (ref 3.5–5.1)
Sodium: 133 mmol/L — ABNORMAL LOW (ref 135–145)

## 2024-05-27 LAB — CBC
HCT: 20.9 % — ABNORMAL LOW (ref 36.0–46.0)
Hemoglobin: 7.1 g/dL — ABNORMAL LOW (ref 12.0–15.0)
MCH: 23.9 pg — ABNORMAL LOW (ref 26.0–34.0)
MCHC: 34 g/dL (ref 30.0–36.0)
MCV: 70.4 fL — ABNORMAL LOW (ref 80.0–100.0)
Platelets: 285 K/uL (ref 150–400)
RBC: 2.97 MIL/uL — ABNORMAL LOW (ref 3.87–5.11)
RDW: 16 % — ABNORMAL HIGH (ref 11.5–15.5)
WBC: 18.1 K/uL — ABNORMAL HIGH (ref 4.0–10.5)
nRBC: 0.1 % (ref 0.0–0.2)

## 2024-05-27 LAB — MAGNESIUM: Magnesium: 2.6 mg/dL — ABNORMAL HIGH (ref 1.7–2.4)

## 2024-05-27 LAB — HEPATITIS B SURFACE ANTIBODY, QUANTITATIVE: Hep B S AB Quant (Post): <3.5 m[IU]/mL — ABNORMAL LOW

## 2024-05-27 LAB — LACTOFERRIN, FECAL, QUALITATIVE: Lactoferrin, Fecal, Qual: NEGATIVE

## 2024-05-27 MED ORDER — CARMEX CLASSIC LIP BALM EX OINT
TOPICAL_OINTMENT | CUTANEOUS | Status: DC | PRN
  Filled 2024-05-27: qty 10

## 2024-05-27 MED ORDER — DARBEPOETIN ALFA 60 MCG/0.3ML IJ SOSY: 60.0000 ug | PREFILLED_SYRINGE | INTRAMUSCULAR | Status: DC

## 2024-05-27 MED ORDER — HEPARIN SODIUM (PORCINE) 1000 UNIT/ML IJ SOLN
INTRAMUSCULAR | Status: AC
  Filled 2024-05-27: qty 2

## 2024-05-27 NOTE — PMR Pre-admission (Signed)
 PMR Admission Coordinator Pre-Admission Assessment  Patient: Danielle Flores is an 58 y.o., female MRN: [id number] DOB: [birth year] Height: 5' 5 (165.1 cm) Weight: 95.8 kg  Insurance Information HMO:    PPO:      PCP:      IPA:      80/20:      OTHER:  PRIMARY: [employer] QHP      Policy#: [id number]       Subscriber:  CM Name: Rosina      Phone#: [phone]    Fax#: [phone] Pre-Cert#: [id number]   I received auth for CIR from [place] with [employer] for admit 06/03/24 through 06/09/24.  Updates due to [place] on 06/10/24 at fax listed above.   Employer:  Benefits:  Phone #: portal Eff Date: 08/08/2023 - 08/06/2024 Deductible: $5,695 ($5,001.57 met) OOP Max: $2,504 ($5,001.57 met) CIR: 50% coverage; 50% co-insurance SNF: 50% coverage; 50% co-insurance; limited to 90 days/cal yr (90 remaining) Outpatient:  50% coverage; 50% co-insurance Home Health:  50% coverage; 50% co-insurance DME: 50% coverage; 50% co-insurance Providers: in network  SECONDARY:       Policy#:      Phone#:   Artist:       Phone#:   [employer]" for patients in Inpatient Rehabilitation Facilities with attached "Privacy Act Statement-Health Care Records" was provided and verbally reviewed with: N/A  Emergency Contact Information Contact Information     Name Relation Home Work Mobile   Berkheimer,Eric Spouse [phone]        Other Contacts   None on File     Current Medical History  Patient Admitting Diagnosis: DKA, Sepsis, Renal Failure History of Present Illness: Pt is a 58 y/o female with PMH for DMT2 on insulin , HTN, HLD, CAD s/p MI 09/2018, S/p 1 coronary stent who presents with worsening flu like symptoms. In the ED she was found to have NSTEMI with increased Toponin 1,322, BG on chenistry 524, K+ 2.9, WBC 14.3, NA 126, Bicarb 12, AG 24. Covid, flu and RSSV negative. Admitted with sebere sepsis, septic chock, and acute renal failure in the setting of DKA.  Negative nitrite and renal US  without obstruction.   She required CRRT Negative nitrite and renal US  without obstruction. Likely ischemic ATN in setting of volume depletion and acute illness as well as hypotension. Pt,. Was found to have a cute respiratory failure with hypoxia in the setting of bilateral multifocal pneumonia. She completed course of Zosyn and was placed on doxycycline. On admission , Pt. With A1c of 9.9%. Placed on subcutaneous Semglee  25 units daily, subcutaneous NovoLog  8 units with meals 3 times daily and sliding scale insulin  coverage. Pt. Was seen by PT/OT and they recommend CIR to assist return to PLOF.   Patient's medical record from [facility] has been reviewed by the rehabilitation admission coordinator and physician.  Past Medical History  Past Medical History:  Diagnosis Date   AKI (acute kidney injury) 05/18/2024   CAD (coronary artery disease) 2021   DKA (diabetic ketoacidosis) (HCC) 05/18/2024   DM2 (diabetes mellitus, type 2) (HCC)    Hyperglycemia    Hyperlipidemia    Hypertension    Patient requiring acute dialysis 05/20/2024   Sepsis (HCC) 05/18/2024    Has the patient had major surgery during 100 days prior to admission? Yes  Family History   family history includes Diabetes in her mother, sister, and another family member; Heart disease in her maternal grandmother; Sickle cell anemia  in her paternal aunt.  Current Medications  Current Facility-Administered Medications:    (feeding supplement) PROSource Plus liquid 30 mL, 30 mL, Oral, BID BM, Harold Scholz, MD, 30 mL at 05/27/24 0921   acetaminophen (TYLENOL) tablet 650 mg, 650 mg, Oral, Q6H PRN, Maree Harder, MD, 650 mg at 05/25/24 0835   bismuth subsalicylate (PEPTO BISMOL) 262 MG/15ML suspension 30 mL, 30 mL, Oral, Q4H PRN, Hall, Carole N, DO, 30 mL at 05/26/24 1917   Chlorhexidine Gluconate Cloth 2 % PADS 6 each, 6 each, Topical, Q0600, Coladonato, Joseph, MD, 6 each at 05/27/24  0453   clopidogrel  (PLAVIX ) tablet 75 mg, 75 mg, Oral, Daily, Gretta Doffing P, DO, 75 mg at 05/27/24 0921   [START ON 05/31/2024] Darbepoetin Alfa (ARANESP) injection 60 mcg, 60 mcg, Subcutaneous, Q Sat-1800, Dennise Hoes, MD   dextrose  50 % solution 0-50 mL, 0-50 mL, Intravenous, PRN, Palumbo, April, MD   docusate sodium (COLACE) capsule 100 mg, 100 mg, Oral, BID PRN, Maree Harder, MD   ezetimibe  (ZETIA ) tablet 10 mg, 10 mg, Oral, Daily, Chand, Sudham, MD, 10 mg at 05/27/24 9077   feeding supplement (ENSURE PLUS HIGH PROTEIN) liquid 237 mL, 237 mL, Oral, BID BM, Chand, Sudham, MD, 237 mL at 05/27/24 9076   Gerhardt's butt cream, , Topical, Daily, Harold Scholz, MD, 1 Application at 05/23/24 0926   heparin  injection 5,000 Units, 5,000 Units, Subcutaneous, Q8H, Chand, Sudham, MD, 5,000 Units at 05/27/24 0552   insulin  aspart (novoLOG ) injection 0-15 Units, 0-15 Units, Subcutaneous, TID WC, Chand, Sudham, MD, 5 Units at 05/27/24 0921   insulin  aspart (novoLOG ) injection 0-5 Units, 0-5 Units, Subcutaneous, QHS, Chand, Sudham, MD, 3 Units at 05/24/24 2218   insulin  aspart (novoLOG ) injection 8 Units, 8 Units, Subcutaneous, TID WC, Harold Scholz, MD, 8 Units at 05/27/24 9077   insulin  glargine-yfgn (SEMGLEE ) injection 25 Units, 25 Units, Subcutaneous, Daily, Chand, Sudham, MD, 25 Units at 05/27/24 9078   lip balm (CARMEX) ointment, , Topical, PRN, Girguis, David, MD   multivitamin (RENA-VIT) tablet 1 tablet, 1 tablet, Oral, QHS, Chand, Sudham, MD, 1 tablet at 05/26/24 2138   ondansetron  (ZOFRAN ) injection 4 mg, 4 mg, Intravenous, Q6H PRN, Hoy, Savannah F, PA-C   Oral care mouth rinse, 15 mL, Mouth Rinse, PRN, Gretta Doffing SQUIBB, DO  Patients Current Diet:  Diet Order             Diet Carb Modified Fluid consistency: Thin; Room service appropriate? Yes with Assist; Fluid restriction: 1500 mL Fluid  Diet effective now                   Precautions / Restrictions Precautions Precautions:  Fall Precaution/Restrictions Comments: CRRT Restrictions Weight Bearing Restrictions Per Provider Order: No   Has the patient had 2 or more falls or a fall with injury in the past year? Yes  Prior Activity Level  Pt. Working and active in the community PTA  Prior Functional Level Self Care: Did the patient need help bathing, dressing, using the toilet or eating? Independent  Indoor Mobility: Did the patient need assistance with walking from room to room (with or without device)? Independent  Stairs: Did the patient need assistance with internal or external stairs (with or without device)? Independent  Functional Cognition: Did the patient need help planning regular tasks such as shopping or remembering to take medications? Independent  Patient Information Are you of Hispanic, Latino/a,or Spanish origin?: A. No, not of Hispanic, Latino/a, or Spanish origin What is your race?:  B. Black or African American Do you need or want an interpreter to communicate with a doctor or health care staff?: 0. No  Patient's Response To:  Health Literacy and Transportation Is the patient able to respond to health literacy and transportation needs?: Yes Health Literacy - How often do you need to have someone help you when you read instructions, pamphlets, or other written material from your doctor or pharmacy?: Never In the past 12 months, has lack of transportation kept you from medical appointments or from getting medications?: No In the past 12 months, has lack of transportation kept you from meetings, work, or from getting things needed for daily living?: No  Home Assistive Devices / Equipment Home Equipment: None  Prior Device Use: Indicate devices/aids used by the patient prior to current illness, exacerbation or injury? None of the above  Current Functional Level Cognition  Orientation Level: Oriented X4    Extremity Assessment (includes Sensation/Coordination)  Upper Extremity  Assessment: Generalized weakness  Lower Extremity Assessment: Generalized weakness, Defer to PT evaluation    ADLs  Overall ADL's : Needs assistance/impaired Eating/Feeding: Set up, Sitting Eating/Feeding Details (indicate cue type and reason): per spouse, pt previously so weak, she was unable to feed self but able to do so now Grooming: Oral care, Wash/dry face, Minimal assistance, Standing Grooming Details (indicate cue type and reason): DOE 3 out 4 RR 40 2L O2 [place]. benefits from energy conservation strategies next session Upper Body Bathing: Moderate assistance Lower Body Bathing: Moderate assistance, Sitting/lateral leans Upper Body Dressing : Moderate assistance, Sitting Lower Body Dressing: Moderate assistance, Sitting/lateral leans Toilet Transfer: +2 for physical assistance, Minimal assistance (eva) General ADL Comments: pt standing on arrival in EVA. End of session education on pursed lip breathing on 2L [place] 02 with RR 38 down to 19. pt with good return demo    Mobility  Overal bed mobility: Needs Assistance Bed Mobility: Supine to Sit, Rolling Rolling: Min assist Supine to sit: Min assist, +2 for safety/equipment General bed mobility comments: Needed assist and cues to roll over and sit up.  Incr time as well.    Transfers  Overall transfer level: Needs assistance Equipment used: None Transfers: Sit to/from Stand Sit to Stand: Min assist, +2 safety/equipment Bed to/from chair/wheelchair/BSC transfer type:: Step pivot Step pivot transfers: Min assist, +2 safety/equipment General transfer comment: cues for hand placement and min assist to come to standing from bed.  Unsteady initially requiring incr assist and cues.    Ambulation / Gait / Stairs / Wheelchair Mobility  Ambulation/Gait Ambulation/Gait assistance: Min assist, +2 safety/equipment Gait Distance (Feet): 25 Feet Assistive device: Rolling walker (2 wheels) Gait Pattern/deviations: Step-through pattern, Decreased  stride length, Decreased step length - right, Decreased step length - left, Shuffle, Drifts right/left, Trunk flexed General Gait Details: Pt was able to ambulate with rW short distance. Pt needing min assist of 2 for stability.    Pt with unsteadiness bil LEs with no knee buckling that required min assist for safety with slow and cautious gait and neeeded assist to steer RW.  Had to have chair follow. Gait velocity interpretation: <1.31 ft/sec, indicative of household ambulator Pre-gait activities: pt took steps from EOB to recliner at [employer] A without an AD and UE support on physical therapist.    Posture / Balance Balance Overall balance assessment: Needs assistance Sitting-balance support: No upper extremity supported, Feet supported Sitting balance-Leahy Scale: Fair Standing balance support: Bilateral upper extremity supported, During functional activity, Reliant on assistive device  for balance Standing balance-Leahy Scale: Poor Standing balance comment: Min A for balance in standing with bil UE suport on RW and external support by therapist    Special considerations/life events  Dialysis: Hemodialysis Tuesday, Thursday, and Saturday, Skin TDC, and Special service needs none   Previous Home Environment (from acute therapy documentation) Living Arrangements: Spouse/significant other Available Help at Discharge: Family, Available 24 hours/day, Friend(s) Type of Home: House Home Layout: One level Home Access: Stairs to enter Entrance Stairs-Rails: None Entrance Stairs-Number of Steps: 3 Bathroom Shower/Tub: Associate Professor: No Home Care Services: No  Discharge Living Setting Plans for Discharge Living Setting: House Type of Home at Discharge: House Discharge Home Layout: One level Discharge Home Access: Stairs to enter Entrance Stairs-Rails: None Entrance Stairs-Number of Steps: 3 Discharge Bathroom Shower/Tub: Tub/shower  unit Discharge Bathroom Toilet: Standard Discharge Bathroom Accessibility: Yes How Accessible: Accessible via walker Does the patient have any problems obtaining your medications?: No  Social/Family/Support Systems Patient Roles: Spouse Contact Information: [phone] Anticipated Caregiver: [place] Caregiver Availability: 24/7 Discharge Plan Discussed with Primary Caregiver: Yes Is Caregiver In Agreement with Plan?: Yes Does Caregiver/Family have Issues with Lodging/Transportation while Pt is in Rehab?: No  Goals Patient/Family Goal for Rehab: PT/OT supervision level Expected length of stay: 10-12 days Pt/Family Agrees to Admission and willing to participate: Yes Program Orientation Provided & Reviewed with Pt/Caregiver Including Roles  & Responsibilities: Yes  Decrease burden of Care through IP rehab admission: Patient/family education  Possible need for SNF placement upon discharge: not anticipated  Patient Condition: Not anticipated  Preadmission Screen Completed By:  Leita KATHEE Kleine, 05/27/2024 1:15 PM ______________________________________________________________________   Discussed status with Dr. Lorilee on 06/04/24 at 1013 and received approval for admission today.  Admission Coordinator:  Leita KATHEE Kleine, CCC-SLP, time 05/19/24/Date 06/04/24   Assessment/Plan: Diagnosis: Debility 2/2 sepsis 2/2 pneumonia Does the need for close, 24 hr/day Medical supervision in concert with the patient's rehab needs make it unreasonable for this patient to be served in a less intensive setting? Yes Co-Morbidities requiring supervision/potential complications: 1) acute kidney injury, 2) microcytic anemia, 3) acute respiratory failure, 4) DM 2, 5) acute metabolic acidosis Due to bladder management, bowel management, safety, skin/wound care, disease management, medication administration, pain management, and patient education, does the patient require 24 hr/day rehab nursing? Yes Does the  patient require coordinated care of a physician, rehab nurse, PT, OT to address physical and functional deficits in the context of the above medical diagnosis(es)? Yes Addressing deficits in the following areas: balance, endurance, locomotion, strength, transferring, bowel/bladder control, bathing, dressing, feeding, grooming, toileting, and psychosocial support Can the patient actively participate in an intensive therapy program of at least 3 hrs of therapy 5 days a week? Yes The potential for patient to make measurable gains while on inpatient rehab is excellent Anticipated functional outcomes upon discharge from inpatient rehab: modified independent PT, modified independent OT, independent SLP Estimated rehab length of stay to reach the above functional goals is: 8-12 days Anticipated discharge destination: Home 10. Overall Rehab/Functional Prognosis: excellent   MD Signature: Sven Lorilee, MD

## 2024-05-27 NOTE — Progress Notes (Signed)
   Inpatient Rehab Admissions Coordinator :  Per therapy change in recommendations, patient was screened for CIR candidacy by Ottie Glazier RN MSN.  At this time patient appears to be a potential candidate for CIR. I will place a rehab consult per protocol for full assessment. Please call me with any questions.  Ottie Glazier RN MSN Admissions Coordinator [phone]

## 2024-05-27 NOTE — TOC Progression Note (Signed)
 Transition of Care [facility]) - Progression Note    Patient Details  Name: MAXIE DEBOSE MRN: [id number] Date of Birth: [birth year]  Transition of Care [facility]) CM/SW Contact  Lauraine FORBES Saa, LCSWA Phone Number: 05/27/2024, 2:28 PM  Clinical Narrative:     2:28 PM Per chart review, patient is being followed by [facility] for potential admission pending medical stability for [facility]. Patient resides at home with spouse. Patient has a PCP and insurance. Patient does not have SNF/HH/DME history. Patient's preferred pharmacy is [facility] [phone] [place]. TOC will continue to follow.   Expected Discharge Plan: IP Rehab Facility Barriers to Discharge: English as a second language teacher, Continued Medical Work up               Expected Discharge Plan and Services   Discharge Planning Services: CM Consult Post Acute Care Choice: IP Rehab Living arrangements for the past 2 months: Single Family Home                                       Social Drivers of Health (SDOH) Interventions SDOH Screenings   Food Insecurity: No Food Insecurity (05/19/2024)  Housing: Low Risk  (05/19/2024)  Transportation Needs: No Transportation Needs (05/19/2024)  Utilities: Not At Risk (05/19/2024)  Alcohol Screen: Low Risk  (02/22/2024)  Depression (PHQ2-9): Low Risk  (02/25/2024)  Financial Resource Strain: Low Risk  (02/22/2024)  Physical Activity: Insufficiently Active (02/22/2024)  Social Connections: Moderately Integrated (02/22/2024)  Stress: No Stress Concern Present (02/22/2024)  Tobacco Use: Low Risk  (05/20/2024)    Readmission Risk Interventions     No data to display

## 2024-05-27 NOTE — Progress Notes (Signed)
 OT Cancellation Note  Patient Details Name: BRISA AUTH MRN: [id number] DOB: 1965/10/07   Cancelled Treatment:    Reason Eval/Treat Not Completed: Patient at procedure or test/ unavailable (HD)  Ely Molt 05/27/2024, 1:05 PM

## 2024-05-27 NOTE — Progress Notes (Signed)
 Inpatient Rehab Admissions Coordinator:    I met with Pt. To discuss potential CIR admit. She is interested, Husband present and states that he can provide support at home. He works MWF but states that Pt. Has friends who can stay with her while he works. Pt. Is not yet medically stable for CIR but I will follow for potential admit pending medical readiness.   Leita Kleine, MS, CCC-SLP Rehab Admissions Coordinator  [phone] (celll) [phone] (office)

## 2024-05-27 NOTE — Procedures (Signed)
 Received patient in bed to unit.  Alert and oriented.  Informed consent signed and in chart.   Pt's R internal jugular catheter was cleansed and tx started with no issues.   TX duration:3hrs 15 mins  Patient tolerated well with no issues. Transported back to the room  Alert, without acute distress.  Hand-off given to patient's nurse.   Access used: R internal jugular catheter Access issues: None  Total UF removed: 1L Medication(s) given: None Post HD weight: 94.8kg Post HD VS: 129/44   [facility]

## 2024-05-27 NOTE — Plan of Care (Signed)
  Problem: Coping: Goal: Ability to adjust to condition or change in health will improve Outcome: Progressing   Problem: Fluid Volume: Goal: Ability to maintain a balanced intake and output will improve Outcome: Progressing   Problem: Health Behavior/Discharge Planning: Goal: Ability to identify and utilize available resources and services will improve Outcome: Progressing Goal: Ability to manage health-related needs will improve Outcome: Not Progressing   Problem: Metabolic: Goal: Ability to maintain appropriate glucose levels will improve Outcome: Progressing   Problem: Nutritional: Goal: Maintenance of adequate nutrition will improve Outcome: Progressing Goal: Progress toward achieving an optimal weight will improve Outcome: Progressing   Problem: Skin Integrity: Goal: Risk for impaired skin integrity will decrease Outcome: Progressing   Problem: Tissue Perfusion: Goal: Adequacy of tissue perfusion will improve Outcome: Progressing   Problem: Education: Goal: Knowledge of General Education information will improve Description: Including pain rating scale, medication(s)/side effects and non-pharmacologic comfort measures Outcome: Progressing   Problem: Health Behavior/Discharge Planning: Goal: Ability to manage health-related needs will improve Outcome: Not Progressing   Problem: Clinical Measurements: Goal: Ability to maintain clinical measurements within normal limits will improve Outcome: Progressing Goal: Will remain free from infection Outcome: Progressing Goal: Diagnostic test results will improve Outcome: Not Progressing Goal: Respiratory complications will improve Outcome: Progressing Goal: Cardiovascular complication will be avoided Outcome: Progressing   Problem: Activity: Goal: Risk for activity intolerance will decrease Outcome: Not Progressing   Problem: Nutrition: Goal: Adequate nutrition will be maintained Outcome: Progressing   Problem:  Coping: Goal: Level of anxiety will decrease Outcome: Progressing   Problem: Elimination: Goal: Will not experience complications related to bowel motility Outcome: Progressing Goal: Will not experience complications related to urinary retention Outcome: Not Progressing

## 2024-05-27 NOTE — Progress Notes (Signed)
 [facility] Progress Note    Assessment/ Plan:    AKI - in setting of DKA.  UA with gluc, blood, and protein.  Negative nitrite and renal US  without obstruction.  Likely ischemic ATN in setting of volume depletion and acute illness as well as hypotension. S/p CRRT via temp line from 10/14-10/16/25. Transitioned to HD on 10/18 Plan for HD today 05/27/24 (can go up to [facility]). No signs of renal recovery at this junction. Will consult IR for [facility] placement once WBC improves further Avoid nephrotoxic medications including NSAIDs and iodinated intravenous contrast exposure unless the latter is absolutely indicated.  Preferred narcotic agents for pain control are hydromorphone, fentanyl , and methadone. Morphine should not be used. Avoid Baclofen and avoid oral sodium phosphate and magnesium citrate based laxatives / bowel preps. Continue strict Input and Output monitoring. Will monitor the patient closely with you and intervene or adjust therapy as indicated by changes in clinical status/labs   Severe sepsis with septic shock due to RML pneumonia - currently on doxycycline.  Per primary service Hematuria: UA pending (collect when able), checking renal u/s Acute hypoxic respiratory failure - improved and now on 2L via  Acute NSTEMI - on plavix  and being followed by Cardiology. Hyponatremia - due to DKA and AKI, will attempt to manage with HD Hypokalemia - due to DKA. resolved DKA - off insulin  drip per primary service Abnormal LFT's - likely shock liver.  Per primary Thrombocytopenia - due to acute critical illness.  Stable 154 AGMA - due to #1 and #7.  Metformin  stopped.  Improved with renal replacement therapy  Anemia - likely due to critical illness and AKI.  She will not accept blood transfusions.  TSAT 16% and will order IV iron once her infection is under better control. Hgb stable, on ESA Severe protein malnutrition - alb down to 1.7.  protein supplements per RD. Discussed with  RN.  Subjective:   Patient seen and examined bedside. No acute events. No complaints. Discussed with RN, has been having some hematuria Unable to collect UA or measure UOP given that urine is mixed with stool   Objective:   BP (!) 161/72 (BP Location: Left Arm)   Pulse (!) 108   Temp 97.7 F (36.5 C) (Oral)   Resp (!) 25   Ht 5' 5 (1.651 m)   Wt 95.8 kg   LMP  (LMP Unknown)   SpO2 94%   BMI 35.15 kg/m  No intake or output data in the 24 hours ending 05/27/24 0751  Weight change: 0.2 kg  Physical Exam: Gen: NAD, laying flat in bed CVS: RRR Resp: CTA B/L Jai:dnqu, nt/nd Ext: no edema Neuro: awake, alert Dialysis access: RIJ temp HD catheter c/d/i  Imaging: No results found.  Labs: BMET Recent Labs  Lab 05/22/24 0220 05/22/24 0615 05/22/24 1620 05/23/24 0235 05/23/24 0236 05/24/24 0350 05/25/24 0340 05/26/24 0417 05/27/24 0519  NA 134*   < > 135 134* 133* 133*  134* 131* 131* 133*  K 4.7   < > 5.0 4.6 4.6 4.4  4.4 4.6 4.5 4.7  CL 99   < > 96* 98 98 98  99 94* 95* 97*  CO2 22   < > 22 21* 20* 20*  20* 23 18* 17*  GLUCOSE 217*   < > 260* 229* 227* 203*  203* 227* 154* 166*  BUN 30*   < > 36* 51* 52* 83*  86* 58* 87* 124*  CREATININE 3.06*   < >  3.31* 4.30* 4.28* 6.64*  6.44* 5.27* 7.20* 9.30*  CALCIUM  7.8*   < > 7.8* 7.6* 7.6* 7.7*  7.8* 7.7* 7.9* 8.2*  PHOS 2.5  --  3.1  --  2.7 3.2 3.3 4.6 6.6*   < > = values in this interval not displayed.   CBC Recent Labs  Lab 05/24/24 0350 05/25/24 0340 05/26/24 0417 05/27/24 0519  WBC 17.9* 17.7* 21.7* 18.1*  HGB 7.3* 7.1* 7.2* 7.1*  HCT 21.0* 20.4* 20.8* 20.9*  MCV 68.6* 69.4* 70.0* 70.4*  PLT 168 219 273 285    Medications:     (feeding supplement) PROSource Plus  30 mL Oral BID BM   Chlorhexidine Gluconate Cloth  6 each Topical Q0600   clopidogrel   75 mg Oral Daily   darbepoetin (ARANESP) injection - DIALYSIS  60 mcg Subcutaneous Q Sun-1800   doxycycline  100 mg Oral Q12H   ezetimibe   10 mg  Oral Daily   feeding supplement  237 mL Oral BID BM   Gerhardt's butt cream   Topical Daily   heparin  injection (subcutaneous)  5,000 Units Subcutaneous Q8H   insulin  aspart  0-15 Units Subcutaneous TID WC   insulin  aspart  0-5 Units Subcutaneous QHS   insulin  aspart  8 Units Subcutaneous TID WC   insulin  glargine-yfgn  25 Units Subcutaneous Daily   multivitamin  1 tablet Oral QHS      Ephriam Stank, MD [facility] 05/27/2024, 7:51 AM

## 2024-05-27 NOTE — Plan of Care (Signed)
 Problem: Education: Goal: Ability to describe self-care measures that may prevent or decrease complications (Diabetes Survival Skills Education) will improve 05/27/2024 1939 by Edith Wyline SAILOR, RN Outcome: Progressing 05/27/2024 1939 by Edith Wyline SAILOR, RN Outcome: Progressing Goal: Individualized Educational Video(s) 05/27/2024 1939 by Edith Wyline SAILOR, RN Outcome: Progressing 05/27/2024 1939 by Edith Wyline SAILOR, RN Outcome: Progressing   Problem: Coping: Goal: Ability to adjust to condition or change in health will improve 05/27/2024 1939 by Edith Wyline SAILOR, RN Outcome: Progressing 05/27/2024 1939 by Edith Wyline SAILOR, RN Outcome: Progressing   Problem: Fluid Volume: Goal: Ability to maintain a balanced intake and output will improve 05/27/2024 1939 by Edith Wyline SAILOR, RN Outcome: Progressing 05/27/2024 1939 by Edith Wyline SAILOR, RN Outcome: Progressing   Problem: Health Behavior/Discharge Planning: Goal: Ability to identify and utilize available resources and services will improve 05/27/2024 1939 by Edith Wyline SAILOR, RN Outcome: Progressing 05/27/2024 1939 by Edith Wyline SAILOR, RN Outcome: Progressing Goal: Ability to manage health-related needs will improve 05/27/2024 1939 by Edith Wyline SAILOR, RN Outcome: Progressing 05/27/2024 1939 by Edith Wyline SAILOR, RN Outcome: Progressing   Problem: Metabolic: Goal: Ability to maintain appropriate glucose levels will improve 05/27/2024 1939 by Edith Wyline SAILOR, RN Outcome: Progressing 05/27/2024 1939 by Edith Wyline SAILOR, RN Outcome: Progressing   Problem: Nutritional: Goal: Maintenance of adequate nutrition will improve 05/27/2024 1939 by Edith Wyline SAILOR, RN Outcome: Progressing 05/27/2024 1939 by Edith Wyline SAILOR, RN Outcome: Progressing Goal: Progress toward achieving an optimal weight will improve 05/27/2024 1939 by Edith Wyline SAILOR, RN Outcome: Progressing 05/27/2024 1939 by Edith Wyline SAILOR, RN Outcome: Progressing   Problem:  Skin Integrity: Goal: Risk for impaired skin integrity will decrease 05/27/2024 1939 by Edith Wyline SAILOR, RN Outcome: Progressing 05/27/2024 1939 by Edith Wyline SAILOR, RN Outcome: Progressing   Problem: Tissue Perfusion: Goal: Adequacy of tissue perfusion will improve 05/27/2024 1939 by Edith Wyline SAILOR, RN Outcome: Progressing 05/27/2024 1939 by Edith Wyline SAILOR, RN Outcome: Progressing   Problem: Education: Goal: Knowledge of General Education information will improve Description: Including pain rating scale, medication(s)/side effects and non-pharmacologic comfort measures 05/27/2024 1939 by Edith Wyline SAILOR, RN Outcome: Progressing 05/27/2024 1939 by Edith Wyline SAILOR, RN Outcome: Progressing   Problem: Health Behavior/Discharge Planning: Goal: Ability to manage health-related needs will improve 05/27/2024 1939 by Edith Wyline SAILOR, RN Outcome: Progressing 05/27/2024 1939 by Edith Wyline SAILOR, RN Outcome: Progressing   Problem: Clinical Measurements: Goal: Ability to maintain clinical measurements within normal limits will improve 05/27/2024 1939 by Edith Wyline SAILOR, RN Outcome: Progressing 05/27/2024 1939 by Edith Wyline SAILOR, RN Outcome: Progressing Goal: Will remain free from infection 05/27/2024 1939 by Edith Wyline SAILOR, RN Outcome: Progressing 05/27/2024 1939 by Edith Wyline SAILOR, RN Outcome: Progressing Goal: Diagnostic test results will improve 05/27/2024 1939 by Edith Wyline SAILOR, RN Outcome: Progressing 05/27/2024 1939 by Edith Wyline SAILOR, RN Outcome: Progressing Goal: Respiratory complications will improve 05/27/2024 1939 by Edith Wyline SAILOR, RN Outcome: Progressing 05/27/2024 1939 by Edith Wyline SAILOR, RN Outcome: Progressing Goal: Cardiovascular complication will be avoided 05/27/2024 1939 by Edith Wyline SAILOR, RN Outcome: Progressing 05/27/2024 1939 by Edith Wyline SAILOR, RN Outcome: Progressing   Problem: Activity: Goal: Risk for activity intolerance will  decrease 05/27/2024 1939 by Edith Wyline SAILOR, RN Outcome: Progressing 05/27/2024 1939 by Edith Wyline SAILOR, RN Outcome: Progressing   Problem: Nutrition: Goal: Adequate nutrition will be maintained 05/27/2024 1939 by Edith Wyline SAILOR, RN Outcome: Progressing 05/27/2024 1939 by Edith Wyline SAILOR, RN Outcome: Progressing  Problem: Coping: Goal: Level of anxiety will decrease 05/27/2024 1939 by Edith Wyline SAILOR, RN Outcome: Progressing 05/27/2024 1939 by Edith Wyline SAILOR, RN Outcome: Progressing   Problem: Elimination: Goal: Will not experience complications related to bowel motility 05/27/2024 1939 by Edith Wyline SAILOR, RN Outcome: Progressing 05/27/2024 1939 by Edith Wyline SAILOR, RN Outcome: Progressing Goal: Will not experience complications related to urinary retention 05/27/2024 1939 by Edith Wyline SAILOR, RN Outcome: Progressing 05/27/2024 1939 by Edith Wyline SAILOR, RN Outcome: Progressing   Problem: Pain Managment: Goal: General experience of comfort will improve and/or be controlled 05/27/2024 1939 by Edith Wyline SAILOR, RN Outcome: Progressing 05/27/2024 1939 by Edith Wyline SAILOR, RN Outcome: Progressing   Problem: Safety: Goal: Ability to remain free from injury will improve 05/27/2024 1939 by Edith Wyline SAILOR, RN Outcome: Progressing 05/27/2024 1939 by Edith Wyline SAILOR, RN Outcome: Progressing   Problem: Skin Integrity: Goal: Risk for impaired skin integrity will decrease 05/27/2024 1939 by Edith Wyline SAILOR, RN Outcome: Progressing 05/27/2024 1939 by Edith Wyline SAILOR, RN Outcome: Progressing

## 2024-05-27 NOTE — Plan of Care (Signed)

## 2024-05-27 NOTE — Progress Notes (Signed)
 Progress Note    Danielle Flores   FMW:[id number]  DOB: 07-26-1966  DOA: 05/18/2024     9 PCP: Antonio Cyndee Jamee JONELLE, DO  Initial CC: flu like symptoms  Hospital Course: Danielle Flores is a 58 year old **[employer]** female with past medical history of CAD status post MI (09/2020) s/p 1 coronary stent, T2DM, HTN, HLD who presented to [facility] ED on 05/18/2024 with 3-day history of rhinorrhea, fever, congestion, cough, shortness of breath, generalized weakness, and 1 day history of N/V.  In the ED, the patient was febrile to a Tmax of 102.6 F, tachycardic to 132, tachypneic at 28, BP 121/73, SpO2 89% on RA that improved to 95% on 2 L nasal cannula.   However patient continued to have increased work of breathing as well as in mild distress and was placed on BiPAP.  CBC showed leukocytosis of 14.3.  CMP remarkable for sodium 126, calcium  2.9, bicarb 12, blood glucose 524, creatinine 2.96 (baseline 0.8-1), AST 233, ALT 62, anion gap 24. BhB 1.30. BNP wnl. Initial high sensitive troponin was elevated to 1322 and ultimately peaked at 2036. COVID/flu/RSV negative.  PCCM was consulted and patient was admitted to the ICU for management of DKA, elevated troponins, AKI, and shortness of breath.   Assessment & Plan:   Severe sepsis with septic shock, POA - resolved -Sepsis physiology has resolved. - Persistent leukocytosis.  WBC downtrending some now; will keep watching - continue doxy; completed zosyn course - Follow-up stool studies.  Diarrhea present since admission she says but no abdominal pain   Acute respiratory failure with hypoxia in the setting of bilateral multifocal pneumonia -resolved - Remains on room air - Patient has completed course of Zosyn.   -Patient is still on oral doxycycline.  - Patient was initially on CRRT (stopped on 05/22/2024).  Need for intermittent hemodialysis is assessed daily.    Diabetes type 2, poorly controlled with hyperglycemia, presented with DKA - DKA has  resolved. - Continue Semglee  and NovoLog  - A1c of 9.9%.   Hypervolemic hyponatremia: - Likely volume related. - Continue to optimize volume with hemodialysis/ultrafiltration.   Acute kidney injury due to septic ATN: - Patient was initially on CRRT. - Dialysis schedule per nephrology - [facility] planned after further improvement in leukocytosis   Acute metabolic acidosis: - Related to acute kidney injury   Demand cardiac ischemia in the setting of septic shock: Shock liver, improving: Anemia and thrombocytopenia critical illness:  Interval History:  Sitting in recliner this morning.  Husband present bedside.  Still endorses ongoing diarrhea but denies any abdominal pain.  Feels a little lethargic after dialysis but tolerating well otherwise.  No other acute concerns this morning.    Antimicrobials:   DVT prophylaxis:  heparin  injection 5,000 Units Start: 05/20/24 1500 SCDs Start: 05/18/24 0404   Code Status:   Code Status: Full Code  Mobility Assessment (Last 72 Hours)     Mobility Assessment     Row Name 05/26/24 1912 05/26/24 1500 05/26/24 0810 05/25/24 1942 05/25/24 0730   Does the patient have exclusion criteria? No - Perform mobility assessment -- No - Perform mobility assessment No - Perform mobility assessment No - Perform mobility assessment   What is the highest level of mobility based on the mobility assessment? Level 4 (Ambulates with assistance) - Balance while stepping forward/back - Complete Level 4 (Ambulates with assistance) - Balance while stepping forward/back - Complete Level 4 (Ambulates with assistance) - Balance while stepping forward/back - Complete Level 4 (  Ambulates with assistance) - Balance while stepping forward/back - Complete Level 4 (Ambulates with assistance) - Balance while stepping forward/back - Complete   Is the above level different from baseline mobility prior to current illness? Yes - Recommend PT order -- Yes - Recommend PT order Yes -  Recommend PT order Yes - Recommend PT order    Row Name 05/24/24 2020           Does the patient have exclusion criteria? No - Perform mobility assessment       What is the highest level of mobility based on the mobility assessment? Level 4 (Ambulates with assistance) - Balance while stepping forward/back - Complete          Barriers to discharge: None Disposition Plan: Home HH orders placed: TBD Status is: Inpatient  Objective: Blood pressure (!) 111/50, pulse (!) 113, temperature 98 F (36.7 C), resp. rate (!) 24, height 5' 5 (1.651 m), weight 95.8 kg, SpO2 97%.  Examination:  Physical Exam Constitutional:      Appearance: Normal appearance.  HENT:     Head: Normocephalic and atraumatic.     Mouth/Throat:     Mouth: Mucous membranes are moist.  Eyes:     Extraocular Movements: Extraocular movements intact.  Cardiovascular:     Rate and Rhythm: Normal rate and regular rhythm.  Pulmonary:     Effort: Pulmonary effort is normal. No respiratory distress.     Breath sounds: Normal breath sounds. No wheezing.  Abdominal:     General: Bowel sounds are normal. There is no distension.     Palpations: Abdomen is soft.     Tenderness: There is no abdominal tenderness.  Musculoskeletal:        General: Normal range of motion.     Cervical back: Normal range of motion and neck supple.  Skin:    General: Skin is warm and dry.  Neurological:     General: No focal deficit present.     Mental Status: She is alert.  Psychiatric:        Mood and Affect: Mood normal.      Consultants:  Nephrology  Procedures:    Data Reviewed: Results for orders placed or performed during the hospital encounter of 05/18/24 (from the past 24 hours)  Glucose, capillary     Status: Abnormal   Collection Time: 05/26/24  4:46 PM  Result Value Ref Range   Glucose-Capillary 188 (H) 70 - 99 mg/dL  Glucose, capillary     Status: Abnormal   Collection Time: 05/26/24  9:54 PM  Result Value Ref  Range   Glucose-Capillary 161 (H) 70 - 99 mg/dL  CBC     Status: Abnormal   Collection Time: 05/27/24  5:19 AM  Result Value Ref Range   WBC 18.1 (H) 4.0 - 10.5 K/uL   RBC 2.97 (L) 3.87 - 5.11 MIL/uL   Hemoglobin 7.1 (L) 12.0 - 15.0 g/dL   HCT 79.0 (L) 63.9 - 53.9 %   MCV 70.4 (L) 80.0 - 100.0 fL   MCH 23.9 (L) 26.0 - 34.0 pg   MCHC 34.0 30.0 - 36.0 g/dL   RDW 83.9 (H) 88.4 - 84.4 %   Platelets 285 150 - 400 K/uL   nRBC 0.1 0.0 - 0.2 %  Renal function panel (daily at 0500)     Status: Abnormal   Collection Time: 05/27/24  5:19 AM  Result Value Ref Range   Sodium 133 (L) 135 - 145 mmol/L  Potassium 4.7 3.5 - 5.1 mmol/L   Chloride 97 (L) 98 - 111 mmol/L   CO2 17 (L) 22 - 32 mmol/L   Glucose, Bld 166 (H) 70 - 99 mg/dL   BUN 875 (H) 6 - 20 mg/dL   Creatinine, Ser 0.69 (H) 0.44 - 1.00 mg/dL   Calcium  8.2 (L) 8.9 - 10.3 mg/dL   Phosphorus 6.6 (H) 2.5 - 4.6 mg/dL   Albumin 1.7 (L) 3.5 - 5.0 g/dL   GFR, Estimated 4 (L) >60 mL/min   Anion gap 19 (H) 5 - 15  Magnesium     Status: Abnormal   Collection Time: 05/27/24  5:19 AM  Result Value Ref Range   Magnesium 2.6 (H) 1.7 - 2.4 mg/dL  Glucose, capillary     Status: Abnormal   Collection Time: 05/27/24  6:30 AM  Result Value Ref Range   Glucose-Capillary 168 (H) 70 - 99 mg/dL  Glucose, capillary     Status: Abnormal   Collection Time: 05/27/24  9:05 AM  Result Value Ref Range   Glucose-Capillary 229 (H) 70 - 99 mg/dL  Glucose, capillary     Status: Abnormal   Collection Time: 05/27/24 11:56 AM  Result Value Ref Range   Glucose-Capillary 202 (H) 70 - 99 mg/dL    I have reviewed pertinent nursing notes, vitals, labs, and images as necessary. I have ordered labwork to follow up on as indicated.  I have reviewed the last notes from staff over past 24 hours. I have discussed patient's care plan and test results with nursing staff, CM/SW, and other staff as appropriate.  Old records reviewed in assessment of this patient  Time  spent: Greater than 50% of the 55 minute visit was spent in counseling/coordination of care for the patient as laid out in the A&P.   LOS: 9 days   Alm Apo, MD [facility] 05/27/2024, 1:55 PM

## 2024-05-28 DIAGNOSIS — E119 Type 2 diabetes mellitus without complications: Secondary | ICD-10-CM

## 2024-05-28 DIAGNOSIS — J9601 Acute respiratory failure with hypoxia: Secondary | ICD-10-CM | POA: Insufficient documentation

## 2024-05-28 DIAGNOSIS — N179 Acute kidney failure, unspecified: Secondary | ICD-10-CM | POA: Diagnosis not present

## 2024-05-28 DIAGNOSIS — I2489 Other forms of acute ischemic heart disease: Secondary | ICD-10-CM | POA: Diagnosis not present

## 2024-05-28 DIAGNOSIS — A419 Sepsis, unspecified organism: Secondary | ICD-10-CM | POA: Diagnosis not present

## 2024-05-28 DIAGNOSIS — E1110 Type 2 diabetes mellitus with ketoacidosis without coma: Secondary | ICD-10-CM | POA: Diagnosis not present

## 2024-05-28 DIAGNOSIS — R6520 Severe sepsis without septic shock: Secondary | ICD-10-CM

## 2024-05-28 DIAGNOSIS — D509 Iron deficiency anemia, unspecified: Secondary | ICD-10-CM | POA: Insufficient documentation

## 2024-05-28 LAB — CBC WITH DIFFERENTIAL/PLATELET
Abs Immature Granulocytes: 0.28 K/uL — ABNORMAL HIGH (ref 0.00–0.07)
Basophils Absolute: 0 K/uL (ref 0.0–0.1)
Basophils Relative: 0 %
Eosinophils Absolute: 0 K/uL (ref 0.0–0.5)
Eosinophils Relative: 0 %
HCT: 21.4 % — ABNORMAL LOW (ref 36.0–46.0)
Hemoglobin: 7.3 g/dL — ABNORMAL LOW (ref 12.0–15.0)
Immature Granulocytes: 2 %
Lymphocytes Relative: 15 %
Lymphs Abs: 2.3 K/uL (ref 0.7–4.0)
MCH: 24.3 pg — ABNORMAL LOW (ref 26.0–34.0)
MCHC: 34.1 g/dL (ref 30.0–36.0)
MCV: 71.1 fL — ABNORMAL LOW (ref 80.0–100.0)
Monocytes Absolute: 0.6 K/uL (ref 0.1–1.0)
Monocytes Relative: 4 %
Neutro Abs: 12 K/uL — ABNORMAL HIGH (ref 1.7–7.7)
Neutrophils Relative %: 79 %
Platelets: 290 K/uL (ref 150–400)
RBC: 3.01 MIL/uL — ABNORMAL LOW (ref 3.87–5.11)
RDW: 16.4 % — ABNORMAL HIGH (ref 11.5–15.5)
Smear Review: NORMAL
WBC: 15.2 K/uL — ABNORMAL HIGH (ref 4.0–10.5)
nRBC: 0.1 % (ref 0.0–0.2)

## 2024-05-28 LAB — O&P RESULT

## 2024-05-28 LAB — RENAL FUNCTION PANEL
Albumin: 1.8 g/dL — ABNORMAL LOW (ref 3.5–5.0)
Anion gap: 16 — ABNORMAL HIGH (ref 5–15)
BUN: 64 mg/dL — ABNORMAL HIGH (ref 6–20)
CO2: 22 mmol/L (ref 22–32)
Calcium: 7.9 mg/dL — ABNORMAL LOW (ref 8.9–10.3)
Chloride: 95 mmol/L — ABNORMAL LOW (ref 98–111)
Creatinine, Ser: 5.92 mg/dL — ABNORMAL HIGH (ref 0.44–1.00)
GFR, Estimated: 8 mL/min — ABNORMAL LOW (ref >60)
Glucose, Bld: 90 mg/dL (ref 70–99)
Phosphorus: 6.1 mg/dL — ABNORMAL HIGH (ref 2.5–4.6)
Potassium: 3.9 mmol/L (ref 3.5–5.1)
Sodium: 133 mmol/L — ABNORMAL LOW (ref 135–145)

## 2024-05-28 LAB — MAGNESIUM: Magnesium: 2.2 mg/dL (ref 1.7–2.4)

## 2024-05-28 LAB — GLUCOSE, CAPILLARY
Glucose-Capillary: 117 mg/dL — ABNORMAL HIGH (ref 70–99)
Glucose-Capillary: 282 mg/dL — ABNORMAL HIGH (ref 70–99)
Glucose-Capillary: 144 mg/dL — ABNORMAL HIGH (ref 70–99)
Glucose-Capillary: 168 mg/dL — ABNORMAL HIGH (ref 70–99)
Glucose-Capillary: 117 mg/dL — ABNORMAL HIGH (ref 70–99)

## 2024-05-28 LAB — NOROVIRUS GROUP 1 & 2 BY PCR, STOOL
Norovirus 1 by PCR: NEGATIVE
Norovirus 2  by PCR: NEGATIVE

## 2024-05-28 LAB — OVA + PARASITE EXAM

## 2024-05-28 MED ORDER — IRON SUCROSE 200 MG IVPB - SIMPLE MED
400.0000 mg | Status: DC
  Filled 2024-05-28: qty 220

## 2024-05-28 MED ORDER — INSULIN GLARGINE-YFGN 100 UNIT/ML ~~LOC~~ SOLN
22.0000 [IU] | Freq: Every day | SUBCUTANEOUS | Status: DC
  Administered 2024-05-28 – 2024-06-04 (×8): 22 [IU] via SUBCUTANEOUS
  Filled 2024-05-28 (×9): qty 0.22

## 2024-05-28 MED ORDER — LOPERAMIDE HCL 2 MG PO CAPS: 2.0000 mg | ORAL_CAPSULE | ORAL | Status: DC | PRN

## 2024-05-28 NOTE — Progress Notes (Signed)
 [facility] Progress Note    Assessment/ Plan:    AKI - in setting of DKA.  UA with gluc, blood, and protein.  Negative nitrite and renal US  without obstruction.  Likely ischemic ATN in setting of volume depletion and acute illness as well as hypotension. S/p CRRT via temp line from 10/14-10/16/25. Transitioned to HD on 10/18 Plan for HD 05/29/24 (can go up to [facility]), will keep on TTS sched. No signs of renal recovery at this junction. Will consult IR for [facility] placement Avoid nephrotoxic medications including NSAIDs and iodinated intravenous contrast exposure unless the latter is absolutely indicated.  Preferred narcotic agents for pain control are hydromorphone, fentanyl , and methadone. Morphine should not be used. Avoid Baclofen and avoid oral sodium phosphate and magnesium citrate based laxatives / bowel preps. Continue strict Input and Output monitoring. Will monitor the patient closely with you and intervene or adjust therapy as indicated by changes in clinical status/labs   Severe sepsis with septic shock due to RML pneumonia -s/p doxycycline.  Per primary service Hematuria: UA pending (collect when able), renal u/s 10/21 unrevealing, w/u per primary service Acute hypoxic respiratory failure - improved  Acute NSTEMI - on plavix  and being followed by Cardiology. Hyponatremia - due to DKA and AKI, will attempt to manage with HD Hypokalemia - due to DKA. resolved DKA - off insulin  drip per primary service Abnormal LFT's - likely shock liver.  Per primary Thrombocytopenia - due to acute critical illness. resolved AGMA - due to #1 and #7.  Metformin  stopped.  Improved with renal replacement therapy  Anemia - likely due to critical illness and AKI.  She will not accept blood transfusions.  TSAT 16% and will order IV iron once her infection is under better control. Hgb stable, on ESA Severe protein malnutrition - alb low.  protein supplements per RD.   Subjective:   Patient seen  and examined bedside. No acute events. No complaints. Tolerated HD yesterday, feels better.   Objective:   BP (!) 154/60 (BP Location: Left Arm)   Pulse (!) 110   Temp 98.4 F (36.9 C) (Oral)   Resp 18   Ht 5' 5 (1.651 m)   Wt 95.8 kg   LMP  (LMP Unknown)   SpO2 94%   BMI 35.15 kg/m   Intake/Output Summary (Last 24 hours) at 05/28/2024 0750 Last data filed at 05/27/2024 1625 Gross per 24 hour  Intake --  Output 1000 ml  Net -1000 ml    Weight change: 0 kg  Physical Exam: Gen: NAD, laying flat in bed CVS: RRR Resp: CTA B/L Jai:dnqu, nt/nd Ext: no edema Neuro: awake, alert Dialysis access: RIJ temp HD catheter c/d/i  Imaging: US  RENAL Result Date: 05/27/2024 CLINICAL DATA:  Hematuria EXAM: RENAL / URINARY TRACT ULTRASOUND COMPLETE COMPARISON:  05/18/2024 FINDINGS: Right Kidney: Renal measurements: 11.8 x 5.5 x 4.5 cm. = volume: 153 mL. No mass lesion or hydronephrosis is noted. 1.8 cm cyst is again seen in the midportion of the right kidney better visualized on the current exam. Left Kidney: Renal measurements: 11.8 x 6.5 x 4.5 cm. = volume: 177 mL. Small exophytic cysts are noted somewhat better visualized than on prior ultrasound. Bladder: Decompressed Other: None. IMPRESSION: Simple cysts bilaterally.  No further follow-up is recommended. No other focal abnormality is seen. Electronically Signed   By: Oneil Devonshire M.D.   On: 05/27/2024 22:44    Labs: BMET Recent Labs  Lab 05/22/24 1620 05/23/24 0235 05/23/24 0236 05/24/24  9649 05/25/24 0340 05/26/24 0417 05/27/24 0519 05/28/24 0246  NA 135 134* 133* 133*  134* 131* 131* 133* 133*  K 5.0 4.6 4.6 4.4  4.4 4.6 4.5 4.7 3.9  CL 96* 98 98 98  99 94* 95* 97* 95*  CO2 22 21* 20* 20*  20* 23 18* 17* 22  GLUCOSE 260* 229* 227* 203*  203* 227* 154* 166* 90  BUN 36* 51* 52* 83*  86* 58* 87* 124* 64*  CREATININE 3.31* 4.30* 4.28* 6.64*  6.44* 5.27* 7.20* 9.30* 5.92*  CALCIUM  7.8* 7.6* 7.6* 7.7*  7.8* 7.7* 7.9*  8.2* 7.9*  PHOS 3.1  --  2.7 3.2 3.3 4.6 6.6* 6.1*   CBC Recent Labs  Lab 05/25/24 0340 05/26/24 0417 05/27/24 0519 05/28/24 0246  WBC 17.7* 21.7* 18.1* 15.2*  NEUTROABS  --   --   --  12.0*  HGB 7.1* 7.2* 7.1* 7.3*  HCT 20.4* 20.8* 20.9* 21.4*  MCV 69.4* 70.0* 70.4* 71.1*  PLT 219 273 285 290    Medications:     (feeding supplement) PROSource Plus  30 mL Oral BID BM   Chlorhexidine Gluconate Cloth  6 each Topical Q0600   clopidogrel   75 mg Oral Daily   [START ON 06/01/2024] darbepoetin (ARANESP) injection - DIALYSIS  60 mcg Subcutaneous Q Sun-1800   ezetimibe   10 mg Oral Daily   feeding supplement  237 mL Oral BID BM   Gerhardt's butt cream   Topical Daily   heparin  injection (subcutaneous)  5,000 Units Subcutaneous Q8H   insulin  aspart  0-15 Units Subcutaneous TID WC   insulin  aspart  0-5 Units Subcutaneous QHS   insulin  aspart  8 Units Subcutaneous TID WC   insulin  glargine-yfgn  22 Units Subcutaneous Daily   multivitamin  1 tablet Oral QHS      Ephriam Stank, MD [facility] 05/28/2024, 7:50 AM

## 2024-05-28 NOTE — Progress Notes (Signed)
 Inpatient Rehab Admissions Coordinator:    CIR following. Not yet ready for CIR, will need TDC catheter. Will follow up once medically stable.  Leita Kleine, MS, CCC-SLP Rehab Admissions Coordinator  [phone] (celll) [phone] (office)

## 2024-05-28 NOTE — Progress Notes (Addendum)
 Progress Note    Danielle Flores   FMW:[id number]  DOB: [birth year]  DOA: 05/18/2024     10 PCP: Antonio Cyndee Jamee JONELLE, DO  Initial CC: flu like symptoms  Hospital Course: Danielle Flores is a 58 year old **[employer]** female with past medical history of CAD status post MI (09/2020) s/p 1 coronary stent, T2DM, HTN, HLD who presented to [facility] ED on 05/18/2024 with 3-day history of rhinorrhea, fever, congestion, cough, shortness of breath, generalized weakness, and 1 day history of N/V.  In the ED, the patient was febrile to a Tmax of 102.6 F, tachycardic to 132, tachypneic at 28, BP 121/73, SpO2 89% on RA that improved to 95% on 2 L nasal cannula.   However patient continued to have increased work of breathing as well as in mild distress and was placed on BiPAP.  CBC showed leukocytosis of 14.3.  CMP remarkable for sodium 126, calcium  2.9, bicarb 12, blood glucose 524, creatinine 2.96 (baseline 0.8-1), AST 233, ALT 62, anion gap 24. BhB 1.30. BNP wnl. Initial high sensitive troponin was elevated to 1322 and ultimately peaked at 2036. COVID/flu/RSV negative.  PCCM was consulted and patient was admitted to the ICU for management of DKA, elevated troponins, AKI, and shortness of breath.   Assessment & Plan:   Severe sepsis due to PNA -Sepsis physiology has resolved. - Persistent leukocytosis.  WBC downtrending some now; will keep watching - Completed doxycycline and Zosyn courses - Stool studies negative.  Okay for Imodium   Acute respiratory failure with hypoxia in the setting of bilateral multifocal pneumonia -resolved - Remains on room air - Patient has completed course of Zosyn.   -Patient is still on oral doxycycline.  - Patient was initially on CRRT (stopped on 05/22/2024).  Need for intermittent hemodialysis is assessed daily.   Microcytic anemia - Likely combo of renal failure and iron deficiency -On Aranesp weekly, Sundays -Nephrology planning on iron repletion at some point    Diabetes type 2, poorly controlled with hyperglycemia, presented with DKA - DKA has resolved. - Continue Semglee  and NovoLog  - A1c of 9.9%.   Hypervolemic hyponatremia - Likely volume related. - Continue to optimize volume with hemodialysis/ultrafiltration   Acute kidney injury due to septic ATN - Patient was initially on CRRT - Dialysis schedule per nephrology - [facility] planned after further improvement in leukocytosis; IR has been consulted    Acute metabolic acidosis - Related to acute kidney injury   Demand cardiac ischemia in the setting of septic shock Shock liver, improving Anemia and thrombocytopenia critical illness  Interval History:  No events overnight.  Tolerated dialysis yesterday.  Feeling a little bit better in general today.  Sister present bedside this morning.  Antimicrobials:   DVT prophylaxis:  heparin  injection 5,000 Units Start: 05/20/24 1500 SCDs Start: 05/18/24 0404   Code Status:   Code Status: Full Code  Mobility Assessment (Last 72 Hours)     Mobility Assessment     Row Name 05/28/24 1214 05/28/24 0745 05/27/24 1945 05/27/24 0923 05/26/24 1912   Does the patient have exclusion criteria? -- No - Perform mobility assessment No - Perform mobility assessment No - Perform mobility assessment No - Perform mobility assessment   What is the highest level of mobility based on the mobility assessment? Level 3 (Stands with assistance) - Balance while standing  and cannot march in place Level 4 (Ambulates with assistance) - Balance while stepping forward/back - Complete Level 4 (Ambulates with assistance) - Balance while stepping  forward/back - Complete Level 4 (Ambulates with assistance) - Balance while stepping forward/back - Complete Level 4 (Ambulates with assistance) - Balance while stepping forward/back - Complete   Is the above level different from baseline mobility prior to current illness? -- Yes - Recommend PT order Yes - Recommend PT order Yes -  Recommend PT order Yes - Recommend PT order    Row Name 05/26/24 1500 05/26/24 0810 05/25/24 1942       Does the patient have exclusion criteria? -- No - Perform mobility assessment No - Perform mobility assessment     What is the highest level of mobility based on the mobility assessment? Level 4 (Ambulates with assistance) - Balance while stepping forward/back - Complete Level 4 (Ambulates with assistance) - Balance while stepping forward/back - Complete Level 4 (Ambulates with assistance) - Balance while stepping forward/back - Complete     Is the above level different from baseline mobility prior to current illness? -- Yes - Recommend PT order Yes - Recommend PT order        Barriers to discharge: None Disposition Plan: Home HH orders placed: TBD Status is: Inpatient  Objective: Blood pressure (!) 138/58, pulse (!) 109, temperature 98 F (36.7 C), temperature source Oral, resp. rate 18, height 5' 5 (1.651 m), weight 95.9 kg, SpO2 95%.  Examination:  Physical Exam Constitutional:      Appearance: Normal appearance.  HENT:     Head: Normocephalic and atraumatic.     Mouth/Throat:     Mouth: Mucous membranes are moist.  Eyes:     Extraocular Movements: Extraocular movements intact.  Cardiovascular:     Rate and Rhythm: Normal rate and regular rhythm.  Pulmonary:     Effort: Pulmonary effort is normal. No respiratory distress.     Breath sounds: Normal breath sounds. No wheezing.  Abdominal:     General: Bowel sounds are normal. There is no distension.     Palpations: Abdomen is soft.     Tenderness: There is no abdominal tenderness.  Musculoskeletal:        General: Normal range of motion.     Cervical back: Normal range of motion and neck supple.  Skin:    General: Skin is warm and dry.  Neurological:     General: No focal deficit present.     Mental Status: She is alert.  Psychiatric:        Mood and Affect: Mood normal.      Consultants:   Nephrology  Procedures:    Data Reviewed: Results for orders placed or performed during the hospital encounter of 05/18/24 (from the past 24 hours)  Glucose, capillary     Status: Abnormal   Collection Time: 05/27/24  5:07 PM  Result Value Ref Range   Glucose-Capillary 136 (H) 70 - 99 mg/dL  Glucose, capillary     Status: Abnormal   Collection Time: 05/27/24  9:32 PM  Result Value Ref Range   Glucose-Capillary 102 (H) 70 - 99 mg/dL  Renal function panel (daily at 0500)     Status: Abnormal   Collection Time: 05/28/24  2:46 AM  Result Value Ref Range   Sodium 133 (L) 135 - 145 mmol/L   Potassium 3.9 3.5 - 5.1 mmol/L   Chloride 95 (L) 98 - 111 mmol/L   CO2 22 22 - 32 mmol/L   Glucose, Bld 90 70 - 99 mg/dL   BUN 64 (H) 6 - 20 mg/dL   Creatinine, Ser 4.07 (H) 0.44 -  1.00 mg/dL   Calcium  7.9 (L) 8.9 - 10.3 mg/dL   Phosphorus 6.1 (H) 2.5 - 4.6 mg/dL   Albumin 1.8 (L) 3.5 - 5.0 g/dL   GFR, Estimated 8 (L) >60 mL/min   Anion gap 16 (H) 5 - 15  Magnesium     Status: None   Collection Time: 05/28/24  2:46 AM  Result Value Ref Range   Magnesium 2.2 1.7 - 2.4 mg/dL  CBC with Differential/Platelet     Status: Abnormal   Collection Time: 05/28/24  2:46 AM  Result Value Ref Range   WBC 15.2 (H) 4.0 - 10.5 K/uL   RBC 3.01 (L) 3.87 - 5.11 MIL/uL   Hemoglobin 7.3 (L) 12.0 - 15.0 g/dL   HCT 78.5 (L) 63.9 - 53.9 %   MCV 71.1 (L) 80.0 - 100.0 fL   MCH 24.3 (L) 26.0 - 34.0 pg   MCHC 34.1 30.0 - 36.0 g/dL   RDW 83.5 (H) 88.4 - 84.4 %   Platelets 290 150 - 400 K/uL   nRBC 0.1 0.0 - 0.2 %   Neutrophils Relative % 79 %   Neutro Abs 12.0 (H) 1.7 - 7.7 K/uL   Lymphocytes Relative 15 %   Lymphs Abs 2.3 0.7 - 4.0 K/uL   Monocytes Relative 4 %   Monocytes Absolute 0.6 0.1 - 1.0 K/uL   Eosinophils Relative 0 %   Eosinophils Absolute 0.0 0.0 - 0.5 K/uL   Basophils Relative 0 %   Basophils Absolute 0.0 0.0 - 0.1 K/uL   WBC Morphology MORPHOLOGY UNREMARKABLE    Smear Review Normal platelet  morphology    Immature Granulocytes 2 %   Abs Immature Granulocytes 0.28 (H) 0.00 - 0.07 K/uL   Polychromasia PRESENT    Target Cells PRESENT   Glucose, capillary     Status: Abnormal   Collection Time: 05/28/24  5:54 AM  Result Value Ref Range   Glucose-Capillary 117 (H) 70 - 99 mg/dL  Glucose, capillary     Status: Abnormal   Collection Time: 05/28/24 11:51 AM  Result Value Ref Range   Glucose-Capillary 282 (H) 70 - 99 mg/dL    I have reviewed pertinent nursing notes, vitals, labs, and images as necessary. I have ordered labwork to follow up on as indicated.  I have reviewed the last notes from staff over past 24 hours. I have discussed patient's care plan and test results with nursing staff, CM/SW, and other staff as appropriate.  Old records reviewed in assessment of this patient  Time spent: Greater than 50% of the 55 minute visit was spent in counseling/coordination of care for the patient as laid out in the A&P.   LOS: 10 days   Alm Apo, MD [facility] 05/28/2024, 2:26 PM

## 2024-05-28 NOTE — Treatment Plan (Signed)
 Occupational Therapy Treatment Patient Details Name: Danielle Flores MRN: [id number] DOB: 08/25/1965 Today's Date: 05/28/2024   History of present illness 58 yo presenting to [facility] on 10/12 due to fever, rhinorrhea, congestion, SOB progressing over the past 3 days. Also with nausea and vomiting for 2 days. Pt found to be in acute respiratory failure with hypoxia in setting of bil multifocal pneumonia and severe sepsis with septic shock; complicated by AKI due to septic NSTEMI and DKA. Pt on CRRT off starting 10/16  PMH: DM II, hyperlipidemia, CAD.   OT comments  OT updating recommendation for Patient will benefit from intensive inpatient follow-up therapy, >3 hours/day. Pt noted to have loose stool with blood during wiping. Rn made aware. Pt fatigues and HR 120s with transfer to take 4 steps to turn to [facility].        If plan is discharge home, recommend the following:  A lot of help with walking and/or transfers;A lot of help with bathing/dressing/bathroom   Equipment Recommendations  BSC/3in1;Other (comment);Wheelchair cushion (measurements OT);Wheelchair (measurements OT)    Recommendations for Other Services PT consult    Precautions / Restrictions Precautions Precautions: Fall Recall of Precautions/Restrictions: Intact Precaution/Restrictions Comments: consider briefs- recent loose stools       Mobility Bed Mobility Overal bed mobility: Needs Assistance Bed Mobility: Supine to Sit, Sit to Supine Rolling: Min assist   Supine to sit: Min assist Sit to supine: Contact guard assist   General bed mobility comments: pt encouraged not to reach for OT but use bed rails and position herself to problem solve exitin the bed on the R side    Transfers Overall transfer level: Needs assistance   Transfers: Sit to/from Stand, Bed to chair/wheelchair/BSC Sit to Stand: Contact guard assist     Step pivot transfers: Contact guard assist           Balance Overall balance assessment:  Needs assistance         Standing balance support: Bilateral upper extremity supported, During functional activity, Reliant on assistive device for balance Standing balance-Leahy Scale: Fair                             ADL either performed or assessed with clinical judgement   ADL Overall ADL's : Needs assistance/impaired     Grooming: Wash/dry hands;Set up;Sitting                   Toilet Transfer: Contact guard assist;Rolling walker (2 wheels);BSC/3in1 Toilet Transfer Details (indicate cue type and reason): pt wanting BSC close to bed due to fatigue with transfer HR 121 Toileting- Clothing Manipulation and Hygiene: Minimal assistance;Sit to/from stand       Functional mobility during ADLs: Contact guard assist;Rolling walker (2 wheels)      Extremity/Trunk Assessment Upper Extremity Assessment Upper Extremity Assessment: Generalized weakness   Lower Extremity Assessment Lower Extremity Assessment: Generalized weakness        Vision   Vision Assessment?: No apparent visual deficits   Perception     Praxis     Communication     Cognition Arousal: Alert Behavior During Therapy: Flat affect Cognition: No apparent impairments                               Following commands: Intact        Cueing   Cueing Techniques: Verbal cues, Gestural cues  Exercises      Shoulder Instructions       General Comments Danielle Flores 3L 02 max HR in session 124    Pertinent Vitals/ Pain       Pain Assessment Pain Assessment: No/denies pain  Home Living                                          Prior Functioning/Environment              Frequency  Min 2X/week        Progress Toward Goals  OT Goals(current goals can now be found in the care plan section)  Progress towards OT goals: Progressing toward goals  Acute Rehab OT Goals Patient Stated Goal: to get better OT Goal Formulation: With patient/family Time  For Goal Achievement: 06/05/24 Potential to Achieve Goals: Good ADL Goals Pt Will Perform Lower Body Bathing: with min assist;sitting/lateral leans;sit to/from stand Pt Will Transfer to Toilet: with contact guard assist;ambulating Pt/caregiver will Perform Home Exercise Program: Increased strength;Both right and left upper extremity;With theraband;Independently;With written HEP provided Additional ADL Goal #1: Pt to verbalize at least 3 energy conservation strategies to implement during daily routine  Plan      Co-evaluation                 AM-PAC OT 6 Clicks Daily Activity     Outcome Measure   Help from another person eating meals?: A Little Help from another person taking care of personal grooming?: A Little Help from another person toileting, which includes using toliet, bedpan, or urinal?: A Little Help from another person bathing (including washing, rinsing, drying)?: A Lot Help from another person to put on and taking off regular upper body clothing?: A Little Help from another person to put on and taking off regular lower body clothing?: A Lot 6 Click Score: 16    End of Session Equipment Utilized During Treatment: Gait belt;Oxygen  OT Visit Diagnosis: Muscle weakness (generalized) (M62.81)   Activity Tolerance Patient limited by fatigue   Patient Left in bed;with call bell/phone within reach;with bed alarm set;with family/visitor present   Nurse Communication Mobility status;Precautions        Time: 8540-8484 OT Time Calculation (min): 16 min  Charges: OT General Charges $OT Visit: 1 Visit OT Treatments $Self Care/Home Management : 8-22 mins   Brynn, OTR/L  Acute Rehabilitation Services Office: [phone] .   Ely Molt 05/28/2024, 4:29 PM

## 2024-05-28 NOTE — Inpatient Diabetes Management (Addendum)
 Inpatient Diabetes Program Recommendations  AACE/ADA: New Consensus Statement on Inpatient Glycemic Control (2015)  Target Ranges:  Prepandial:   less than 140 mg/dL      Peak postprandial:   less than 180 mg/dL (1-2 hours)      Critically ill patients:  140 - 180 mg/dL   Lab Results  Component Value Date   GLUCAP 282 (H) 05/28/2024   HGBA1C 9.9 (H) 05/18/2024    Review of Glycemic Control  Latest Reference Range & Units 05/27/24 06:30 05/27/24 09:05 05/27/24 11:56 05/27/24 17:07 05/27/24 21:32 05/28/24 05:54 05/28/24 11:51  Glucose-Capillary 70 - 99 mg/dL 831 (H) 770 (H) 797 (H) 136 (H) 102 (H) 117 (H) 282 (H)  (H): Data is abnormally high  Diabetes history: DM2 Outpatient Diabetes medications: 70/30-22 units BID, Metformin  500 mg BID Current orders for Inpatient glycemic control: Semglee  22 units every day, Novolog  0-15 units TID and 0-5 units at bedtime, 8 units TID  Inpatient Diabetes Program Recommendations:    If appropriate, please consider changing supplement from Ensure Plus (47 CHO's) to Ensure Max (6 CHO's).  Met with patient at bedside.  She confirms above home medications.  She states she drinks Eveline Somerset (lemonade and sweet tea) and she likes to eat good foods.  She checks her glucose using her glucometer.  She is not interested in a CGM.  Reviewed patient's current A1c of 9.9%. Explained what a A1c is and what it measures. Also reviewed goal A1c with patient, importance of good glucose control @ home, and blood sugar goals.  Educated on The Plate Method, CHO's, portion control, avoiding caloric beverages, CBGs at home fasting and mid afternoon, F/U with PCP every 3 months, bring meter to PCP office, long and short term complications of uncontrolled BG, and importance of exercise.  Thank you, Wyvonna Pinal, MSN, CDCES Diabetes Coordinator Inpatient Diabetes Program [phone] (team pager from 8a-5p)

## 2024-05-28 NOTE — Progress Notes (Signed)
 Physical Therapy Treatment Patient Details Name: Danielle Flores MRN: [id number] DOB: February 27, 1966 Today's Date: 05/28/2024   History of Present Illness 58 yo presenting to [facility] on 10/12 due to fever, rhinorrhea, congestion, SOB progressing over the past 3 days. Also with nausea and vomiting for 2 days. Pt found to be in acute respiratory failure with hypoxia in setting of bil multifocal pneumonia and severe sepsis with septic shock; complicated by AKI due to septic NSTEMI and DKA. Pt on CRRT off starting 10/16  PMH: DM II, hyperlipidemia, CAD.    PT Comments  Pt received in supine, reluctant to participate in gait or OOB mobility due to food tray just arriving and pt requesting to eat. Pt agreeable to limited bed-level session for education on supine/seated LE exercises, use of IS and repositioning for improved pulmonary clearance and to decrease risk of aspiration during meal time. Pt needing up to minA for posterior supine scooting to [facility] with bed in trend and cues for improved body mechanics and for transfer to long sitting with UE supported from high fowlers posture. Bed up in chair posture with HOB >50 deg at end of session, handouts given to encourage LE exercises. Patient will benefit from intensive inpatient follow-up therapy, >3 hours/day, pt agreeable to get OOB with nursing or mobility staff later in the day as PTA notified her therapist will likely not have time to return.    If plan is discharge home, recommend the following: Assist for transportation;Assistance with cooking/housework;Help with stairs or ramp for entrance;A little help with walking and/or transfers;A little help with bathing/dressing/bathroom   Can travel by private Theme park manager (measurements PT);Wheelchair cushion (measurements PT);Rolling walker (2 wheels);BSC/3in1    Recommendations for Other Services Rehab consult     Precautions / Restrictions Precautions Precautions:  Fall Recall of Precautions/Restrictions: Intact Precaution/Restrictions Comments: consider briefs- recent loose stools Restrictions Weight Bearing Restrictions Per Provider Order: No     Mobility  Bed Mobility Overal bed mobility: Needs Assistance             General bed mobility comments: Pt defers EOB/OOB due to wanting to eat first, able to perform long sitting from elevated HOB posture with bil side rails and minA, and performs posterior supine scoot to [facility] with overhead rail and bed in trend wtih minA bed pad assist, cues to push with BLE    Transfers Overall transfer level: Needs assistance                 General transfer comment: pt defers, wanting to eat first    Ambulation/Gait                   Stairs             Wheelchair Mobility     Tilt Bed    Modified Rankin (Stroke Patients Only)       Balance Overall balance assessment: Needs assistance Sitting-balance support: No upper extremity supported, Feet unsupported Sitting balance-Leahy Scale: Fair Sitting balance - Comments: needs bil rails for long sitting in bed, but anticipate fair once EOB       Standing balance comment: pt defers                            Communication Communication Communication: No apparent difficulties  Cognition Arousal: Alert Behavior During Therapy: Flat affect   PT - Cognitive impairments: No  apparent impairments                       PT - Cognition Comments: pt defers OOB to chair for her lunch meal, and reluctant to sit >40 deg in bed chair posture to eat, agreeable with max encouragement. Following commands: Intact      Cueing Cueing Techniques: Verbal cues, Gestural cues  Exercises Other Exercises Other Exercises: supine BLE AROM: ankle pumps x5 reps, pulling to long sit with BUE support x2 reps, HEP handout given to encourage supine/seated exercises Other Exercises: IS x 2 reps for teachback, pt achieves ~750  mL    General Comments General comments (skin integrity, edema, etc.): SpO2 WFL on 2L O2 [place] in bed chair psoture, HR ~110-115 bpm with HOB >50 deg      Pertinent Vitals/Pain Pain Assessment Pain Assessment: Faces Faces Pain Scale: Hurts a little bit Pain Location: generalized, denies back pain when asked Pain Descriptors / Indicators: Discomfort, Grimacing Pain Intervention(s): Limited activity within patient's tolerance, Monitored during session, Repositioned    Home Living                          Prior Function            PT Goals (current goals can now be found in the care plan section) Acute Rehab PT Goals Patient Stated Goal: to return to home and work PT Goal Formulation: With patient Time For Goal Achievement: 06/04/24 Progress towards PT goals: Progressing toward goals    Frequency    Min 2X/week      PT Plan      Co-evaluation              AM-PAC PT 6 Clicks Mobility   Outcome Measure  Help needed turning from your back to your side while in a flat bed without using bedrails?: A Little Help needed moving from lying on your back to sitting on the side of a flat bed without using bedrails?: A Lot (w/o rails) Help needed moving to and from a bed to a chair (including a wheelchair)?: A Little Help needed standing up from a chair using your arms (e.g., wheelchair or bedside chair)?: A Lot Help needed to walk in hospital room?: Total Help needed climbing 3-5 steps with a railing? : Total 6 Click Score: 12    End of Session Equipment Utilized During Treatment: Oxygen Activity Tolerance: Patient limited by fatigue Patient left: with call bell/phone within reach;with family/visitor present;in bed;with bed alarm set;Other (comment) (friend visiting her; in bed chair posture and set up to eat from lunch tray) Nurse Communication: Mobility status;Precautions;Other (comment) (pt requesting to get OOB later in the day with staff assist) PT Visit  Diagnosis: Unsteadiness on feet (R26.81);Other abnormalities of gait and mobility (R26.89);Muscle weakness (generalized) (M62.81)     Time: 8793-8785 PT Time Calculation (min) (ACUTE ONLY): 8 min  Charges:    $Therapeutic Activity: 8-22 mins PT General Charges $$ ACUTE PT VISIT: 1 Visit                     Shanna Un P., PTA Acute Rehabilitation Services Secure Chat Preferred 9a-5:30pm Office: [phone]    Connell HERO [facility] 05/28/2024, 12:36 PM

## 2024-05-28 NOTE — Progress Notes (Addendum)
 Nutrition Follow-up  DOCUMENTATION CODES:   Not applicable  INTERVENTION:  Continue liberalized diet to Carb Modified with 1500 mL Fluid Restriction   Monitor blood sugars and need for additional modifications to insulin  regimen - modifications ongoing   Continue Ensure Plus High Protein po BID, each supplement provides 350 kcal and 20 grams of protein.   Continue 30 ml ProSource Plus BID, each supplement provides 100 kcals and 15 grams protein.   Continue renal MVI   NUTRITION DIAGNOSIS:  Increased nutrient needs related to acute illness as evidenced by estimated needs.  GOAL:  Patient will meet greater than or equal to 90% of their needs   MONITOR:  PO intake, Supplement acceptance, I & O's, Labs, Weight trends  REASON FOR ASSESSMENT:  Consult Assessment of nutrition requirement/status (CRRT)  ASSESSMENT:   58 yo female admitted with severe sepsis with pneumonia, acute respiratory failure, anuric AKI with acidosis, poorly controlled DM. PMH includes HTN, HLD, CAD s/p MI with stent x 1, DM2 on insulin .  10/12 Admitted NSTEMI, sepsis 10/13 BiPap, Febrile Tmax 103 10/14 CRRT initiated  10/16 CRRT discontinued 10/17 Remains anuric, Creatinine rising 10/18 Transitioned to HD 10/21 Renal ultrasound unrevealing   Resting w/ sister at bedside during time of assessment. CIR following.   She reports it is blood-tinged UOP, which is increasing she says. No documentation to review. Tolerating dialysis treatments. BUN/Crt have trended up.    Thinks appetite is improving, but continues to endorse she is forcing herself to eat. Pt drinking Ensure sometimes; pt also taking Pro-Source. States it's a lot, but willing to continue to accept as she understands the benefit.   Lunch tray at bedside. Had consumed 80-90% of her hotdog. Some 20% cottage cheese consumed w/ fruit still at bedside.   Average Meal Intake 10/17: 75-100% x2 documented meals 10/18: 100% x1 documented meal  (french toast slice and coffee) 10/22: 100% x1 documented meal (4 oz cottage cheese w/ peaches and grape jelly)   Pt still with edema on exam, primarily in lower extremities, although she notes it has improved. Current wt 95.9 kg, highest wt 107.3 kg. Pt reports UBW 189 pounds (86 kg)  Last HD tx: 10/21 UF removed: 1L   CBGs poorly controlled at present; range 230-310. Pt with poorly controlled DM at baseline. Noted diabetes coordinator recommending change from Ensure Plus High Protein to Ensure Max. This would reduce caloric augmentation by 200 kcals per offering. Given her intake levels documented as desirable, orders themself vary in size and content.   Recommend continuing Ensure Plus High Protein for now until she is able to take in more at meal time to better meet estimated calorie needs. Could consider Nepro to prove 38 CHOs versus Ensure's 47.   Labs: Sodium 133 (L) BUN 64 (H) Creatinine 5.92 (H) Potassium 3.9 (wdl) Phosphorus 6.1 (H) Magnesium 2.2 (wdl) Elevated LFTs-trending down   Meds: Darbepoetin Alfa 60mcg weekly Ss novolog  with meals and bedtime Novolog  8 units with meals Semglee  22 modified today from 25 Rena-vite  NUTRITION - FOCUSED PHYSICAL EXAM:  Still w/ some edema present. Recommend follow up NFPE once fluid status improves a bit more. Could be masking additional muscle/fat depletions.   Diet Order:   Diet Order             Diet Carb Modified Fluid consistency: Thin; Room service appropriate? Yes with Assist; Fluid restriction: 1500 mL Fluid  Diet effective now  EDUCATION NEEDS:   Not appropriate for education at this time  Skin:  Skin Assessment: Reviewed RN Assessment  Last BM:  10/22 - type 6 x1  Height:  Ht Readings from Last 1 Encounters:  05/18/24 5' 5 (1.651 m)   Weight:  Wt Readings from Last 1 Encounters:  05/28/24 95.9 kg   Ideal Body Weight:     BMI:  Body mass index is 35.18 kg/m.  Estimated  Nutritional Needs:   Kcal:  1700-1900 kcals  Protein:  100-125 g  Fluid:  1L plus UOP  Blair Deaner MS, RD, LDN Registered Dietitian Clinical Nutrition RD Inpatient Contact Info in Amion

## 2024-05-29 DIAGNOSIS — E1110 Type 2 diabetes mellitus with ketoacidosis without coma: Secondary | ICD-10-CM | POA: Diagnosis not present

## 2024-05-29 DIAGNOSIS — J9601 Acute respiratory failure with hypoxia: Secondary | ICD-10-CM

## 2024-05-29 DIAGNOSIS — N179 Acute kidney failure, unspecified: Secondary | ICD-10-CM | POA: Diagnosis not present

## 2024-05-29 LAB — CBC WITH DIFFERENTIAL/PLATELET
Abs Immature Granulocytes: 0.16 K/uL — ABNORMAL HIGH (ref 0.00–0.07)
Basophils Absolute: 0.1 K/uL (ref 0.0–0.1)
Basophils Relative: 0 %
Eosinophils Absolute: 0 K/uL (ref 0.0–0.5)
Eosinophils Relative: 0 %
HCT: 21.8 % — ABNORMAL LOW (ref 36.0–46.0)
Hemoglobin: 7.3 g/dL — ABNORMAL LOW (ref 12.0–15.0)
Immature Granulocytes: 1 %
Lymphocytes Relative: 17 %
Lymphs Abs: 1.9 K/uL (ref 0.7–4.0)
MCH: 24.2 pg — ABNORMAL LOW (ref 26.0–34.0)
MCHC: 33.5 g/dL (ref 30.0–36.0)
MCV: 72.2 fL — ABNORMAL LOW (ref 80.0–100.0)
Monocytes Absolute: 0.6 K/uL (ref 0.1–1.0)
Monocytes Relative: 5 %
Neutro Abs: 8.6 K/uL — ABNORMAL HIGH (ref 1.7–7.7)
Neutrophils Relative %: 77 %
Platelets: 339 K/uL (ref 150–400)
RBC: 3.02 MIL/uL — ABNORMAL LOW (ref 3.87–5.11)
RDW: 16.9 % — ABNORMAL HIGH (ref 11.5–15.5)
Smear Review: NORMAL
WBC: 11.4 K/uL — ABNORMAL HIGH (ref 4.0–10.5)
nRBC: 0.2 % (ref 0.0–0.2)

## 2024-05-29 LAB — RENAL FUNCTION PANEL
Albumin: 2 g/dL — ABNORMAL LOW (ref 3.5–5.0)
Anion gap: 18 — ABNORMAL HIGH (ref 5–15)
BUN: 91 mg/dL — ABNORMAL HIGH (ref 6–20)
CO2: 20 mmol/L — ABNORMAL LOW (ref 22–32)
Calcium: 7.9 mg/dL — ABNORMAL LOW (ref 8.9–10.3)
Chloride: 97 mmol/L — ABNORMAL LOW (ref 98–111)
Creatinine, Ser: 8.02 mg/dL — ABNORMAL HIGH (ref 0.44–1.00)
GFR, Estimated: 5 mL/min — ABNORMAL LOW (ref >60)
Glucose, Bld: 125 mg/dL — ABNORMAL HIGH (ref 70–99)
Phosphorus: 8.5 mg/dL — ABNORMAL HIGH (ref 2.5–4.6)
Potassium: 4.1 mmol/L (ref 3.5–5.1)
Sodium: 135 mmol/L (ref 135–145)

## 2024-05-29 LAB — CBC
HCT: 21.1 % — ABNORMAL LOW (ref 36.0–46.0)
Hemoglobin: 7 g/dL — ABNORMAL LOW (ref 12.0–15.0)
MCH: 24.6 pg — ABNORMAL LOW (ref 26.0–34.0)
MCHC: 33.2 g/dL (ref 30.0–36.0)
MCV: 74 fL — ABNORMAL LOW (ref 80.0–100.0)
Platelets: 337 K/uL (ref 150–400)
RBC: 2.85 MIL/uL — ABNORMAL LOW (ref 3.87–5.11)
RDW: 18.3 % — ABNORMAL HIGH (ref 11.5–15.5)
WBC: 10.5 K/uL (ref 4.0–10.5)
nRBC: 0.2 % (ref 0.0–0.2)

## 2024-05-29 LAB — MAGNESIUM: Magnesium: 2.4 mg/dL (ref 1.7–2.4)

## 2024-05-29 LAB — GLUCOSE, CAPILLARY
Glucose-Capillary: 142 mg/dL — ABNORMAL HIGH (ref 70–99)
Glucose-Capillary: 96 mg/dL (ref 70–99)
Glucose-Capillary: 230 mg/dL — ABNORMAL HIGH (ref 70–99)
Glucose-Capillary: 153 mg/dL — ABNORMAL HIGH (ref 70–99)

## 2024-05-29 MED ORDER — HEPARIN SODIUM (PORCINE) 1000 UNIT/ML DIALYSIS
1000.0000 [IU] | INTRAMUSCULAR | Status: DC | PRN
  Administered 2024-05-29: 2400 [IU]

## 2024-05-29 MED ORDER — IRON SUCROSE 400 MG IVPB - SIMPLE MED: 400.0000 mg | Status: DC

## 2024-05-29 MED ORDER — ALTEPLASE 2 MG IJ SOLR: 2.0000 mg | Freq: Once | INTRAMUSCULAR | Status: DC | PRN

## 2024-05-29 MED ORDER — IRON SUCROSE 200 MG IVPB - SIMPLE MED
200.0000 mg | Status: DC
  Filled 2024-05-29: qty 110

## 2024-05-29 MED ORDER — CEFAZOLIN SODIUM-DEXTROSE 2-4 GM/100ML-% IV SOLN: 2.0000 g | INTRAVENOUS | Status: DC

## 2024-05-29 MED ORDER — LIDOCAINE HCL (PF) 1 % IJ SOLN: 5.0000 mL | INTRAMUSCULAR | Status: DC | PRN

## 2024-05-29 MED ORDER — LIDOCAINE-PRILOCAINE 2.5-2.5 % EX CREA: 1.0000 | TOPICAL_CREAM | CUTANEOUS | Status: DC | PRN

## 2024-05-29 MED ORDER — NEPRO/CARBSTEADY PO LIQD: 237.0000 mL | ORAL | Status: DC | PRN

## 2024-05-29 MED ORDER — SODIUM CHLORIDE 0.9 % IV SOLN
200.0000 mg | INTRAVENOUS | Status: DC
  Administered 2024-05-29 – 2024-05-31 (×2): 200 mg via INTRAVENOUS
  Filled 2024-05-29: qty 200
  Filled 2024-05-29 (×2): qty 10

## 2024-05-29 MED ORDER — HEPARIN SODIUM (PORCINE) 1000 UNIT/ML IJ SOLN
INTRAMUSCULAR | Status: AC
  Filled 2024-05-29: qty 4

## 2024-05-29 MED ORDER — CEFAZOLIN SODIUM-DEXTROSE 2-4 GM/100ML-% IV SOLN: 2.0000 g | INTRAVENOUS | Status: AC

## 2024-05-29 MED ORDER — PENTAFLUOROPROP-TETRAFLUOROETH EX AERO
1.0000 | INHALATION_SPRAY | CUTANEOUS | Status: DC | PRN
Start: 2024-05-29 — End: 2024-05-29

## 2024-05-29 MED ORDER — ANTICOAGULANT SODIUM CITRATE 4% (200MG/5ML) IV SOLN: 5.0000 mL | Status: DC | PRN

## 2024-05-29 NOTE — Plan of Care (Signed)
  Problem: Coping: Goal: Ability to adjust to condition or change in health will improve Outcome: Progressing   Problem: Health Behavior/Discharge Planning: Goal: Ability to identify and utilize available resources and services will improve Outcome: Progressing Goal: Ability to manage health-related needs will improve Outcome: Progressing   Problem: Tissue Perfusion: Goal: Adequacy of tissue perfusion will improve Outcome: Progressing   Problem: Education: Goal: Knowledge of General Education information will improve Description: Including pain rating scale, medication(s)/side effects and non-pharmacologic comfort measures Outcome: Progressing   Problem: Health Behavior/Discharge Planning: Goal: Ability to manage health-related needs will improve Outcome: Progressing   Problem: Activity: Goal: Risk for activity intolerance will decrease Outcome: Progressing   Problem: Nutrition: Goal: Adequate nutrition will be maintained Outcome: Progressing   Problem: Safety: Goal: Ability to remain free from injury will improve Outcome: Progressing

## 2024-05-29 NOTE — Consult Note (Signed)
 Chief Complaint:  AKI secondary to septic ATN  Procedure: Tunneled Hemodialysis Catheter Placement  Referring Provider(s): Dr. LULLA Stank  Supervising Physician: Jennefer Rover  Patient Status: [facility] - In-pt  History of Present Illness: Danielle Flores is a 58 y.o. female with a history of T2DM on insulin , HTN, and CAD s/p stent placement who presented to the ED on 10/12 with multiple days of shortness of breath, weakness, and emesis. ED workup revealed NSTEMI with elevated troponin to 1322, glucose of 524, potassium of 2.9, and wbc 14.3. She was admitted to the ICU for management of DKA, sepsis, and NSTEMI. Nephrology was consulted for worsening AKI and she was started on CRRT on 10/14 following temporary line placement in ICU. She was transitioned to hemodialysis on 10/18. Unfortunately, she continues to show minimal signs of renal recovery with BUN/Cr on 91/8.02. IR with request to place tunneled dialysis catheter for continued management.   Patient is resting in bed in no acute distress. States that she has been feeling well overall and denies any issues with her current temporary catheter. Denies any shortness of breath, chest pain, nausea/vomiting, fevers/chills. All questions and concerns answered at the bedside.   Patient is Full Code  Past Medical History:  Diagnosis Date   AKI (acute kidney injury) 05/18/2024   CAD (coronary artery disease) 2021   DKA (diabetic ketoacidosis) (HCC) 05/18/2024   DM2 (diabetes mellitus, type 2) (HCC)    Hyperglycemia    Hyperlipidemia    Hypertension    Patient requiring acute dialysis 05/20/2024   Sepsis (HCC) 05/18/2024    Past Surgical History:  Procedure Laterality Date   CESAREAN SECTION     CORONARY STENT INTERVENTION N/A 09/16/2018   Procedure: CORONARY STENT INTERVENTION;  Surgeon: Dann Candyce RAMAN, MD;  Location: [facility] INVASIVE CV LAB;  Service: Cardiovascular;  Laterality: N/A;   LEFT HEART CATH AND CORONARY ANGIOGRAPHY N/A  09/16/2018   Procedure: LEFT HEART CATH AND CORONARY ANGIOGRAPHY;  Surgeon: Dann Candyce RAMAN, MD;  Location: [facility] INVASIVE CV LAB;  Service: Cardiovascular;  Laterality: N/A;    Allergies: Lisinopril , Atorvastatin , and Red blood cells  Medications: Prior to Admission medications   Medication Sig Start Date End Date Taking? Authorizing Provider  amLODipine  (NORVASC ) 10 MG tablet Take 1 tablet (10 mg total) by mouth daily. 04/08/24  Yes Antonio Cyndee Jamee JONELLE, DO  clopidogrel  (PLAVIX ) 75 MG tablet Take 1 tablet (75 mg total) by mouth daily. 01/30/24  Yes Fountain, Madison L, NP  ezetimibe  (ZETIA ) 10 MG tablet Take 1 tablet by mouth once daily 12/05/23  Yes Fountain, Madison L, NP  insulin  isophane & regular human KwikPen (NOVOLIN  70/30 KWIKPEN) (70-30) 100 UNIT/ML KwikPen Inject 26 Units into the skin in the morning and at bedtime. 04/18/24  Yes Antonio Cyndee Jamee R, DO  metoprolol  tartrate (LOPRESSOR ) 100 MG tablet Take 1 tablet (100 mg total) by mouth 3 (three) times daily. 12/19/23 05/18/25 Yes Fountain, Madison L, NP  rosuvastatin  (CRESTOR ) 40 MG tablet Take 1 tablet (40 mg total) by mouth daily. Needs an appt 05/12/24  Yes Antonio Cyndee Jamee R, DO  blood glucose meter kit and supplies Relion Prime or Dispense other brand based on patient and insurance preference. Use up to four times daily as directed. (FOR ICD-9 250.00, 250.01). 09/17/18   Pearlean Manus, MD  empagliflozin  (JARDIANCE ) 25 MG TABS tablet TAKE 1 TABLET BY MOUTH ONCE DAILY BEFORE BREAKFAST Patient not taking: Reported on 05/18/2024 11/05/23   Antonio Cyndee Jamee JONELLE,  DO  metFORMIN  (GLUCOPHAGE ) 1000 MG tablet TAKE 1/2 (ONE-HALF) TABLET BY MOUTH TWICE DAILY WITH A MEAL 05/19/24   Antonio Cyndee Jamee JONELLE, DO     Family History  Problem Relation Age of Onset   Diabetes Mother    Diabetes Sister    Sickle cell anemia Paternal Aunt    Heart disease Maternal Grandmother        heart enlargement   Diabetes Other        1st degree  relative    Social History   Socioeconomic History   Marital status: Married    Spouse name: Not on file   Number of children: Not on file   Years of education: Not on file   Highest education level: Some college, no degree  Occupational History   Occupation: cleans houses  Tobacco Use   Smoking status: Never   Smokeless tobacco: Never  Vaping Use   Vaping status: Never Used  Substance and Sexual Activity   Alcohol use: Yes    Comment: twice montly   Drug use: No   Sexual activity: Not on file  Other Topics Concern   Not on file  Social History Narrative   Not on file   Social Drivers of Health   Financial Resource Strain: Low Risk  (02/22/2024)   Overall Financial Resource Strain (CARDIA)    Difficulty of Paying Living Expenses: Not hard at all  Food Insecurity: No Food Insecurity (05/19/2024)   Hunger Vital Sign    Worried About Running Out of Food in the Last Year: Never true    Ran Out of Food in the Last Year: Never true  Transportation Needs: No Transportation Needs (05/19/2024)   PRAPARE - Administrator, Civil Service (Medical): No    Lack of Transportation (Non-Medical): No  Physical Activity: Insufficiently Active (02/22/2024)   Exercise Vital Sign    Days of Exercise per Week: 2 days    Minutes of Exercise per Session: 30 min  Stress: No Stress Concern Present (02/22/2024)   [employer] of Occupational Health - Occupational Stress Questionnaire    Feeling of Stress: Not at all  Social Connections: Moderately Integrated (02/22/2024)   Social Connection and Isolation Panel    Frequency of Communication with Friends and Family: Once a week    Frequency of Social Gatherings with Friends and Family: Twice a week    Attends Religious Services: More than 4 times per year    Active Member of [employer] or Organizations: No    Attends Engineer, structural: Not on file    Marital Status: Married    Review of Systems Patient denies any  headache, chest pain, shortness of breath, abdominal pain, N/V, or fever/chills. All other systems are negative.   Vital Signs: BP 131/70 (BP Location: Left Arm)   Pulse (!) 106   Temp 97.7 F (36.5 C) (Oral)   Resp 18   Ht 5' 5 (1.651 m)   Wt 205 lb 7.5 oz (93.2 kg)   LMP  (LMP Unknown)   SpO2 95%   BMI 34.19 kg/m    Physical Exam Constitutional:      Appearance: Normal appearance.  HENT:     Mouth/Throat:     Mouth: Mucous membranes are moist.     Pharynx: Oropharynx is clear.  Neck:     Comments: Right internal jugular temporary HD catheter in place Cardiovascular:     Rate and Rhythm: Normal rate and regular rhythm.  Pulmonary:  Effort: Pulmonary effort is normal.  Abdominal:     General: Abdomen is flat.     Palpations: Abdomen is soft.     Tenderness: There is no abdominal tenderness.  Skin:    General: Skin is warm and dry.  Neurological:     Mental Status: She is alert and oriented to person, place, and time.  Psychiatric:        Behavior: Behavior normal.     Imaging: US  RENAL Result Date: 05/27/2024 CLINICAL DATA:  Hematuria EXAM: RENAL / URINARY TRACT ULTRASOUND COMPLETE COMPARISON:  05/18/2024 FINDINGS: Right Kidney: Renal measurements: 11.8 x 5.5 x 4.5 cm. = volume: 153 mL. No mass lesion or hydronephrosis is noted. 1.8 cm cyst is again seen in the midportion of the right kidney better visualized on the current exam. Left Kidney: Renal measurements: 11.8 x 6.5 x 4.5 cm. = volume: 177 mL. Small exophytic cysts are noted somewhat better visualized than on prior ultrasound. Bladder: Decompressed Other: None. IMPRESSION: Simple cysts bilaterally.  No further follow-up is recommended. No other focal abnormality is seen. Electronically Signed   By: Oneil Devonshire M.D.   On: 05/27/2024 22:44   DG Chest Port 1 View Result Date: 05/22/2024 EXAM: 1 VIEW(S) XRAY OF THE CHEST 05/22/2024 10:35:00 AM COMPARISON: 05/20/2024 CLINICAL HISTORY: pneumonia FINDINGS: LINES,  TUBES AND DEVICES: Stable right IJ central venous catheter. LUNGS AND PLEURA: Low lung volumes. Persistent bibasilar airspace opacities, unchanged from prior. HEART AND MEDIASTINUM: Aortic atherosclerosis. BONES AND SOFT TISSUES: No acute osseous abnormality. IMPRESSION: 1. Persistent bibasilar airspace opacities, unchanged from prior. Electronically signed by: Oneil Devonshire MD 05/22/2024 02:25 PM EDT RP Workstation: [id number]   DG CHEST PORT 1 VIEW Result Date: 05/20/2024 CLINICAL DATA:  Status post dialysis catheter insertion EXAM: PORTABLE CHEST 1 VIEW COMPARISON:  05/18/2024 FINDINGS: New right jugular temporary dialysis catheter is seen extending to the cavoatrial junction. No pneumothorax is noted. Persistent bibasilar airspace opacities are noted although somewhat improved when compared with the prior exam. IMPRESSION: Status post temporary dialysis catheter placement. No pneumothorax is noted. Persistent airspace opacities in the bases although somewhat improved when compared with prior exams. Electronically Signed   By: Oneil Devonshire M.D.   On: 05/20/2024 12:53   ECHOCARDIOGRAM COMPLETE Result Date: 05/19/2024    ECHOCARDIOGRAM REPORT   Patient Name:   ALLYSE FREGEAU Date of Exam: 05/19/2024 Medical Rec #:  [id number]        Height:       65.0 in Accession #:    [id number]       Weight:       235.9 lb Date of Birth:  Nov 03, 1965         BSA:          2.122 m Patient Age:    58 years         BP:           148/73 mmHg Patient Gender: F                HR:           123 bpm. Exam Location:  Inpatient Procedure: 2D Echo (Both Spectral and Color Flow Doppler were utilized during            procedure). Indications:    elevated troponin  History:        Patient has prior history of Echocardiogram examinations, most  recent 09/13/2018. CAD, Signs/Symptoms:Murmur; Risk                 Factors:Hypertension, Diabetes and Dyslipidemia.  Sonographer:    Tinnie Barefoot RDCS Referring Phys: [phone]  WHITNEY D HARRIS IMPRESSIONS  1. Peak LVOT gradient at rest 25 mmHG. Left ventricular ejection fraction, by estimation, is >75%. The left ventricle has hyperdynamic function. The left ventricle has no regional wall motion abnormalities. There is severe left ventricular hypertrophy. Left ventricular diastolic parameters are indeterminate. Elevated left atrial pressure. The E/e' is 16.  2. Right ventricular systolic function is normal. The right ventricular size is normal. There is normal pulmonary artery systolic pressure. The estimated right ventricular systolic pressure is 25.8 mmHg.  3. The mitral valve is degenerative. Mild mitral valve regurgitation. No evidence of mitral stenosis.  4. The aortic valve is tricuspid. Aortic valve regurgitation is not visualized. Aortic valve sclerosis is present, with no evidence of aortic valve stenosis.  5. The inferior vena cava is normal in size with greater than 50% respiratory variability, suggesting right atrial pressure of 3 mmHg. Comparison(s): A prior study was performed on 09/13/2018. LVEF 60-65%, mid cavitary gradient with peak gradient , Grade 2 diastolic dysfunction. FINDINGS  Left Ventricle: Peak LVOT gradient at rest 25 mmHG. Left ventricular ejection fraction, by estimation, is >75%. The left ventricle has hyperdynamic function. The left ventricle has no regional wall motion abnormalities. The left ventricular internal cavity size was small. There is severe left ventricular hypertrophy. Left ventricular diastolic parameters are indeterminate. Elevated left atrial pressure. The E/e' is 16. Right Ventricle: The right ventricular size is normal. No increase in right ventricular wall thickness. Right ventricular systolic function is normal. There is normal pulmonary artery systolic pressure. The tricuspid regurgitant velocity is 2.39 m/s, and  with an assumed right atrial pressure of 3 mmHg, the estimated right ventricular systolic pressure is 25.8 mmHg. Left  Atrium: Left atrial size was normal in size. Right Atrium: Right atrial size was normal in size. Pericardium: There is no evidence of pericardial effusion. Mitral Valve: The mitral valve is degenerative in appearance. Mild mitral annular calcification. Mild mitral valve regurgitation. No evidence of mitral valve stenosis. Tricuspid Valve: The tricuspid valve is normal in structure. Tricuspid valve regurgitation is not demonstrated. No evidence of tricuspid stenosis. Aortic Valve: The aortic valve is tricuspid. Aortic valve regurgitation is not visualized. Aortic valve sclerosis is present, with no evidence of aortic valve stenosis. Aortic valve mean gradient measures 22.0 mmHg. Aortic valve peak gradient measures 42.5 mmHg. Pulmonic Valve: The pulmonic valve was normal in structure. Pulmonic valve regurgitation is not visualized. No evidence of pulmonic stenosis. Aorta: The aortic root and ascending aorta are structurally normal, with no evidence of dilitation. Venous: The inferior vena cava is normal in size with greater than 50% respiratory variability, suggesting right atrial pressure of 3 mmHg. IAS/Shunts: The atrial septum is grossly normal.  LEFT VENTRICLE PLAX 2D LVIDd:         3.70 cm   Diastology LVIDs:         2.20 cm   LV e' medial:    6.09 cm/s LV PW:         1.40 cm   LV E/e' medial:  17.4 LV IVS:        1.60 cm   LV e' lateral:   7.07 cm/s LVOT diam:     1.90 cm   LV E/e' lateral: 15.0 LVOT Area:     2.84 cm  RIGHT VENTRICLE             IVC RV Basal diam:  2.30 cm     IVC diam: 1.40 cm RV S prime:     15.30 cm/s TAPSE (M-mode): 1.6 cm LEFT ATRIUM             Index        RIGHT ATRIUM           Index LA diam:        3.30 cm 1.56 cm/m   RA Area:     10.70 cm LA Vol (A2C):   48.2 ml 22.72 ml/m  RA Volume:   23.10 ml  10.89 ml/m LA Vol (A4C):   51.3 ml 24.18 ml/m LA Biplane Vol: 53.3 ml 25.12 ml/m  AORTIC VALVE AV Vmax:      326.00 cm/s AV Vmean:     217.000 cm/s AV VTI:       0.445 m AV Peak Grad:  42.5 mmHg AV Mean Grad: 22.0 mmHg  AORTA Ao Root diam: 3.20 cm Ao Asc diam:  3.30 cm MITRAL VALVE                TRICUSPID VALVE MV Area (PHT): 5.27 cm     TR Peak grad:   22.8 mmHg MV Decel Time: 144 msec     TR Vmax:        239.00 cm/s MV E velocity: 106.00 cm/s MV A velocity: 135.00 cm/s  SHUNTS MV E/A ratio:  0.79         Systemic Diam: 1.90 cm Sunit Tolia Electronically signed by Madonna Large Signature Date/Time: 05/19/2024/5:46:39 PM    Final    CT CHEST ABDOMEN PELVIS WO CONTRAST Result Date: 05/19/2024 CLINICAL DATA:  Sepsis EXAM: CT CHEST, ABDOMEN AND PELVIS WITHOUT CONTRAST TECHNIQUE: Multidetector CT imaging of the chest, abdomen and pelvis was performed following the standard protocol without IV contrast. RADIATION DOSE REDUCTION: This exam was performed according to the departmental dose-optimization program which includes automated exposure control, adjustment of the mA and/or kV according to patient size and/or use of iterative reconstruction technique. COMPARISON:  Chest x-ray from earlier in the same day. FINDINGS: CT CHEST FINDINGS Cardiovascular: Somewhat limited due to lack of IV contrast. Atherosclerotic calcifications are noted. No enlargement of the pulmonary artery is seen. No cardiac enlargement is seen. Mediastinum/Nodes: Thoracic inlet is within normal limits. No hilar or mediastinal adenopathy is noted. The esophagus is within normal limits. Lungs/Pleura: Lungs show bilateral lower lobe, left upper lobe and right middle lobe consolidation increased in the interval from the chest x-ray from hours previous. No sizable effusion is seen. Small 4 mm inflammatory nodule is noted in the left upper lobe best seen on image number 69 of series 5. Musculoskeletal: No chest wall mass or suspicious bone lesions identified. CT ABDOMEN PELVIS FINDINGS Hepatobiliary: No focal liver abnormality is seen. No gallstones, gallbladder wall thickening, or biliary dilatation. Pancreas: Unremarkable. No  pancreatic ductal dilatation or surrounding inflammatory changes. Spleen: Normal in size without focal abnormality. Adrenals/Urinary Tract: Adrenal glands are within normal limits. Kidneys demonstrate no renal calculi or obstructive changes. Ureters are within normal limits. Bladder is decompressed by Foley catheter. Stomach/Bowel: No obstructive or inflammatory changes of the colon are seen. The appendix is not discretely visualized. No inflammatory changes to suggest appendicitis are noted. Small bowel and stomach are within normal limits. Vascular/Lymphatic: Aortic atherosclerosis. No enlarged abdominal or pelvic lymph nodes. Reproductive: Uterine fibroid change is noted. No  adnexal mass is seen. Other: No abdominal wall hernia or abnormality. No abdominopelvic ascites. Musculoskeletal: No acute or significant osseous findings. IMPRESSION: Extensive bilateral infiltrates increased when compared with the recent plain film. Small likely postinflammatory nodule in the left upper lobe. No specific follow-up is recommended. Uterine fibroid change. No other focal abnormality is seen. Aortic Atherosclerosis (ICD10-I70.0). Electronically Signed   By: Oneil Devonshire M.D.   On: 05/19/2024 00:54   DG Chest Port 1 View Result Date: 05/18/2024 CLINICAL DATA:  Hypoxia EXAM: PORTABLE CHEST 1 VIEW COMPARISON:  Film from earlier in the same day. FINDINGS: Cardiac shadow is stable. Left lung is clear. Increased consolidation is noted in the right middle lobe along the minor fissure. No sizable effusion is seen. IMPRESSION: Increasing right middle lobe consolidation. Electronically Signed   By: Oneil Devonshire M.D.   On: 05/18/2024 20:44   US  RENAL Result Date: 05/18/2024 CLINICAL DATA:  Acute kidney injury EXAM: RENAL / URINARY TRACT ULTRASOUND COMPLETE COMPARISON:  None Available. FINDINGS: Right Kidney: Renal measurements: 11.7 x 6.8 x 5.8 cm = volume: 237 mL. Normal echotexture. No hydronephrosis. Questionable complex cystic  area laterally, not well visualized. Left Kidney: Renal measurements: 11.3 x 5.6 x 5.6 cm = volume: 186 mL. 1.8 cm cyst in the lower pole. Normal echotexture. No hydronephrosis. Bladder: Not visualized. Other: None. IMPRESSION: No acute findings.  No hydronephrosis. Question complex cystic area laterally in the right kidney. This could be further evaluated with contrast-enhanced CT or MRI. Electronically Signed   By: Franky Crease M.D.   On: 05/18/2024 18:01   DG Chest 2 View Result Date: 05/18/2024 CLINICAL DATA:  Shortness of breath EXAM: CHEST - 2 VIEW COMPARISON:  09/13/2018 FINDINGS: The heart size and mediastinal contours are within normal limits. Both lungs are clear. The visualized skeletal structures are unremarkable. IMPRESSION: No active cardiopulmonary disease. Electronically Signed   By: Oneil Devonshire M.D.   On: 05/18/2024 02:10    Labs:  CBC: Recent Labs    05/26/24 0417 05/27/24 0519 05/28/24 0246 05/29/24 0234  WBC 21.7* 18.1* 15.2* 11.4*  HGB 7.2* 7.1* 7.3* 7.3*  HCT 20.8* 20.9* 21.4* 21.8*  PLT 273 285 290 339    COAGS: Recent Labs    05/18/24 0156  INR 1.2    BMP: Recent Labs    05/26/24 0417 05/27/24 0519 05/28/24 0246 05/29/24 0234  NA 131* 133* 133* 135  K 4.5 4.7 3.9 4.1  CL 95* 97* 95* 97*  CO2 18* 17* 22 20*  GLUCOSE 154* 166* 90 125*  BUN 87* 124* 64* 91*  CALCIUM  7.9* 8.2* 7.9* 7.9*  CREATININE 7.20* 9.30* 5.92* 8.02*  GFRNONAA 6* 4* 8* 5*    LIVER FUNCTION TESTS: Recent Labs    05/21/24 1008 05/21/24 1738 05/22/24 1142 05/22/24 1620 05/23/24 0235 05/23/24 0236 05/26/24 0417 05/27/24 0519 05/28/24 0246 05/29/24 0234  BILITOT 1.4*  --  1.0  --  0.9  --  0.8  --   --   --   AST 1,128*  --  997*  --  812*  --  231*  --   --   --   ALT 192*  --  204*  --  177*  --  112*  --   --   --   ALKPHOS 123  --  166*  --  159*  --  157*  --   --   --   PROT 5.6*  --  6.1*  --  5.6*  --  5.6*  --   --   --   ALBUMIN 1.7*   < > 1.9*   < > 1.7*    < > 1.7*  1.7* 1.7* 1.8* 2.0*   < > = values in this interval not displayed.    TUMOR MARKERS: No results for input(s): AFPTM, CEA, CA199, CHROMGRNA in the last 8760 hours.  Assessment and Plan:  AKI secondary to ischemic ATN: SHAWNTELL DIXSON is a 58 y.o. female with a history of T2DM on insulin , and HTN who presented to the ED with concerns for flu. Found to have DKA, sepsis, and NSTEMI with worsening AKI. She was admitted to ICU and started on CRRT. Later transitioned to HD, but continues to have poor renal recovery and will need continued hemodialysis. She presents to [facility] department for an image-guided tunneled hemodialysis placement with Dr. KANDICE Banner. Procedure to be performed under moderate sedation.  -NPO at midnight -Ancef ordered to IR -WBC 11.4 today < 15.2 yesterday -Remains tachy -Temp cath functioning appropriately -Will plan for tentative tunneled line placement on 10/24 if schedule allows  Risks and benefits discussed with the patient including, but not limited to bleeding, infection, vascular injury, pneumothorax which may require chest tube placement, air embolism or even death  All of the patient's questions were answered, patient is agreeable to proceed. Consent signed and in chart.   Thank you for allowing our service to participate in BRI WAKEMAN 's care.    Electronically Signed: Glennon CHRISTELLA Bal, PA-C   05/29/2024, 9:23 AM     I spent a total of 20 Minutes in face to face in clinical consultation, greater than 50% of which was counseling/coordinating care for tunneled hemodialysis catheter placement.

## 2024-05-29 NOTE — Procedures (Signed)
 Received patient in bed to unit.  Alert and oriented.  Informed consent signed and in chart.  RIJ non tunneled tri lumen catheter accessed per policy. Tx initiated per MD order. UF goal 2000 ml. 1230 B/P trending down, UF goal decreased to 1500 ml.  TX duration:3.25  Tx complete. Blood returned. Tri lumen catheter flushed with NS, heparin  left to dwell. Caps and clamps in place. Patient tolerated well.  Transported back to the room  Alert, without acute distress.  Hand-off given to patient's nurse.   Access used: RIJ non tunneled tri lumen catheter Access issues: none  Total UF removed: 1500 ml Medication(s) given: See MAR   [facility]

## 2024-05-29 NOTE — Progress Notes (Signed)
 [facility] Progress Note    Assessment/ Plan:    AKI - in setting of DKA.  UA with gluc, blood, and protein.  Negative nitrite and renal US  without obstruction.  Likely ischemic ATN in setting of volume depletion and acute illness as well as hypotension. S/p CRRT via temp line from 10/14-10/16/25. Transitioned to HD on 10/18 Plan for HD today (can go up to [facility]), will keep on TTS sched. No signs of renal recovery at this junction looking at trajectory of labs. IF following for [facility] placement, likely today since WBC improving Avoid nephrotoxic medications including NSAIDs and iodinated intravenous contrast exposure unless the latter is absolutely indicated.  Preferred narcotic agents for pain control are hydromorphone, fentanyl , and methadone. Morphine should not be used. Avoid Baclofen and avoid oral sodium phosphate and magnesium citrate based laxatives / bowel preps. Continue strict Input and Output monitoring. Will monitor the patient closely with you and intervene or adjust therapy as indicated by changes in clinical status/labs   Severe sepsis with septic shock due to RML pneumonia -s/p doxycycline.  Per primary service Hematuria: UA pending (collect when able), renal u/s 10/21 unrevealing, w/u per primary service Acute hypoxic respiratory failure - improved  Acute NSTEMI - on plavix  and being followed by Cardiology. Hyponatremia - due to DKA and AKI, will attempt to manage with HD Hypokalemia - due to DKA. resolved DKA - off insulin  drip per primary service Abnormal LFT's - likely shock liver.  Per primary Thrombocytopenia - due to acute critical illness. resolved AGMA - due to #1 and #7.  Metformin  stopped.  Improved with renal replacement therapy  Anemia - likely due to critical illness and AKI.  She will not accept blood transfusions.  TSAT 16% and will order IV iron once her infection is under better control. Hgb stable, on ESA, receiving IV Fe Severe protein malnutrition  - alb low.  protein supplements per RD.  Pending CIR.   Subjective:   Patient seen and examined bedside. No acute events. No complaints.    Objective:   BP 131/70 (BP Location: Left Arm)   Pulse (!) 106   Temp 97.7 F (36.5 C) (Oral)   Resp 18   Ht 5' 5 (1.651 m)   Wt 93.2 kg   LMP  (LMP Unknown)   SpO2 95%   BMI 34.19 kg/m   Intake/Output Summary (Last 24 hours) at 05/29/2024 0756 Last data filed at 05/29/2024 9275 Gross per 24 hour  Intake 478 ml  Output --  Net 478 ml    Weight change: 0.1 kg  Physical Exam: Gen: NAD, laying flat in bed CVS: RRR Resp: CTA B/L Jai:dnqu, nt/nd Ext: no edema Neuro: awake, alert Dialysis access: RIJ temp HD catheter c/d/i  Imaging: US  RENAL Result Date: 05/27/2024 CLINICAL DATA:  Hematuria EXAM: RENAL / URINARY TRACT ULTRASOUND COMPLETE COMPARISON:  05/18/2024 FINDINGS: Right Kidney: Renal measurements: 11.8 x 5.5 x 4.5 cm. = volume: 153 mL. No mass lesion or hydronephrosis is noted. 1.8 cm cyst is again seen in the midportion of the right kidney better visualized on the current exam. Left Kidney: Renal measurements: 11.8 x 6.5 x 4.5 cm. = volume: 177 mL. Small exophytic cysts are noted somewhat better visualized than on prior ultrasound. Bladder: Decompressed Other: None. IMPRESSION: Simple cysts bilaterally.  No further follow-up is recommended. No other focal abnormality is seen. Electronically Signed   By: Oneil Devonshire M.D.   On: 05/27/2024 22:44    Labs: BMET Recent  Labs  Lab 05/23/24 0236 05/24/24 0350 05/25/24 0340 05/26/24 0417 05/27/24 0519 05/28/24 0246 05/29/24 0234  NA 133* 133*  134* 131* 131* 133* 133* 135  K 4.6 4.4  4.4 4.6 4.5 4.7 3.9 4.1  CL 98 98  99 94* 95* 97* 95* 97*  CO2 20* 20*  20* 23 18* 17* 22 20*  GLUCOSE 227* 203*  203* 227* 154* 166* 90 125*  BUN 52* 83*  86* 58* 87* 124* 64* 91*  CREATININE 4.28* 6.64*  6.44* 5.27* 7.20* 9.30* 5.92* 8.02*  CALCIUM  7.6* 7.7*  7.8* 7.7* 7.9* 8.2*  7.9* 7.9*  PHOS 2.7 3.2 3.3 4.6 6.6* 6.1* 8.5*   CBC Recent Labs  Lab 05/26/24 0417 05/27/24 0519 05/28/24 0246 05/29/24 0234  WBC 21.7* 18.1* 15.2* 11.4*  NEUTROABS  --   --  12.0* 8.6*  HGB 7.2* 7.1* 7.3* 7.3*  HCT 20.8* 20.9* 21.4* 21.8*  MCV 70.0* 70.4* 71.1* 72.2*  PLT 273 285 290 339    Medications:     (feeding supplement) PROSource Plus  30 mL Oral BID BM   Chlorhexidine Gluconate Cloth  6 each Topical Q0600   clopidogrel   75 mg Oral Daily   [START ON 06/01/2024] darbepoetin (ARANESP) injection - DIALYSIS  60 mcg Subcutaneous Q Sun-1800   ezetimibe   10 mg Oral Daily   feeding supplement  237 mL Oral BID BM   Gerhardt's butt cream   Topical Daily   heparin  injection (subcutaneous)  5,000 Units Subcutaneous Q8H   insulin  aspart  0-15 Units Subcutaneous TID WC   insulin  aspart  0-5 Units Subcutaneous QHS   insulin  aspart  8 Units Subcutaneous TID WC   insulin  glargine-yfgn  22 Units Subcutaneous Daily   multivitamin  1 tablet Oral QHS      Ephriam Stank, MD [facility] 05/29/2024, 7:56 AM

## 2024-05-29 NOTE — Progress Notes (Signed)
 Progress Note    ONA ROEHRS   FMW:[id number]  DOB: 03/07/1966  DOA: 05/18/2024     11 PCP: Antonio Cyndee Jamee JONELLE, DO  Initial CC: flu like symptoms  Hospital Course: Danielle Flores is a 58 year old **Jehovah's Witness** female with past medical history of CAD status post MI (09/2020) s/p 1 coronary stent, T2DM, HTN, HLD who presented to [facility] ED on 05/18/2024 with 3-day history of rhinorrhea, fever, congestion, cough, shortness of breath, generalized weakness, and 1 day history of N/V.  In the ED, the patient was febrile to a Tmax of 102.6 F, tachycardic to 132, tachypneic at 28, BP 121/73, SpO2 89% on RA that improved to 95% on 2 L nasal cannula.   However patient continued to have increased work of breathing as well as in mild distress and was placed on BiPAP.  CBC showed leukocytosis of 14.3.  CMP remarkable for sodium 126, calcium  2.9, bicarb 12, blood glucose 524, creatinine 2.96 (baseline 0.8-1), AST 233, ALT 62, anion gap 24. BhB 1.30. BNP wnl. Initial high sensitive troponin was elevated to 1322 and ultimately peaked at 2036. COVID/flu/RSV negative.  PCCM was consulted and patient was admitted to the ICU for management of DKA, elevated troponins, AKI, and shortness of breath.   Assessment & Plan:   Acute kidney injury due to septic ATN - Patient was initially on CRRT - Dialysis schedule per nephrology - [facility] planned after further improvement in leukocytosis; IR has been consulted  - Tentative plan for tunneled cath placement on 05/30/2024  Severe sepsis due to PNA -resolved -Sepsis physiology has resolved. - Persistent leukocytosis.  WBC downtrending; will keep watching - Completed doxycycline and Zosyn courses - Stool studies negative.  Okay for Imodium   Acute respiratory failure with hypoxia in the setting of bilateral multifocal pneumonia -resolved - Remains on room air - Patient has completed course of Zosyn and doxy  - Patient was initially on CRRT (stopped on  05/22/2024).  Microcytic anemia - Likely combo of renal failure and iron deficiency -On Aranesp weekly, Sundays -Nephrology planning on iron repletion at some point   Diabetes type 2, poorly controlled with hyperglycemia, presented with DKA - DKA has resolved. - Continue Semglee  and NovoLog  - A1c of 9.9%.   Hypervolemic hyponatremia - Likely volume related. - Continue to optimize volume with hemodialysis/ultrafiltration   Acute metabolic acidosis - Related to acute kidney injury   Demand cardiac ischemia in the setting of septic shock Shock liver, improving Anemia and thrombocytopenia critical illness  Interval History:  No events overnight.  Planning to undergo dialysis later today.  Remains afebrile and leukocytosis continues to downtrend.  Tentative plan for tunneled HD cath on Friday with radiology.  Antimicrobials:   DVT prophylaxis:  heparin  injection 5,000 Units Start: 05/20/24 1500 SCDs Start: 05/18/24 0404   Code Status:   Code Status: Full Code  Mobility Assessment (Last 72 Hours)     Mobility Assessment     Row Name 05/28/24 2001 05/28/24 1600 05/28/24 1214 05/28/24 0745 05/27/24 1945   Does the patient have exclusion criteria? No - Perform mobility assessment -- -- No - Perform mobility assessment No - Perform mobility assessment   What is the highest level of mobility based on the mobility assessment? Level 4 (Ambulates with assistance) - Balance while stepping forward/back - Complete Level 3 (Stands with assistance) - Balance while standing  and cannot march in place Level 3 (Stands with assistance) - Balance while standing  and cannot march in  place Level 4 (Ambulates with assistance) - Balance while stepping forward/back - Complete Level 4 (Ambulates with assistance) - Balance while stepping forward/back - Complete   Is the above level different from baseline mobility prior to current illness? Yes - Recommend PT order -- -- Yes - Recommend PT order Yes -  Recommend PT order    Row Name 05/27/24 9076 05/26/24 1912 05/26/24 1500       Does the patient have exclusion criteria? No - Perform mobility assessment No - Perform mobility assessment --     What is the highest level of mobility based on the mobility assessment? Level 4 (Ambulates with assistance) - Balance while stepping forward/back - Complete Level 4 (Ambulates with assistance) - Balance while stepping forward/back - Complete Level 4 (Ambulates with assistance) - Balance while stepping forward/back - Complete     Is the above level different from baseline mobility prior to current illness? Yes - Recommend PT order Yes - Recommend PT order --        Barriers to discharge: None Disposition Plan: Home HH orders placed: TBD Status is: Inpatient  Objective: Blood pressure 126/60, pulse (!) 114, temperature 98.5 F (36.9 C), temperature source Oral, resp. rate (!) 29, height 5' 5 (1.651 m), weight 92.2 kg, SpO2 99%.  Examination:  Physical Exam Constitutional:      Appearance: Normal appearance.  HENT:     Head: Normocephalic and atraumatic.     Mouth/Throat:     Mouth: Mucous membranes are moist.  Eyes:     Extraocular Movements: Extraocular movements intact.  Cardiovascular:     Rate and Rhythm: Normal rate and regular rhythm.  Pulmonary:     Effort: Pulmonary effort is normal. No respiratory distress.     Breath sounds: Normal breath sounds. No wheezing.  Abdominal:     General: Bowel sounds are normal. There is no distension.     Palpations: Abdomen is soft.     Tenderness: There is no abdominal tenderness.  Musculoskeletal:        General: Normal range of motion.     Cervical back: Normal range of motion and neck supple.  Skin:    General: Skin is warm and dry.  Neurological:     General: No focal deficit present.     Mental Status: She is alert.  Psychiatric:        Mood and Affect: Mood normal.      Consultants:  Nephrology  Procedures:    Data  Reviewed: Results for orders placed or performed during the hospital encounter of 05/18/24 (from the past 24 hours)  Glucose, capillary     Status: Abnormal   Collection Time: 05/28/24  4:34 PM  Result Value Ref Range   Glucose-Capillary 144 (H) 70 - 99 mg/dL  Glucose, capillary     Status: Abnormal   Collection Time: 05/28/24  6:09 PM  Result Value Ref Range   Glucose-Capillary 168 (H) 70 - 99 mg/dL  Glucose, capillary     Status: Abnormal   Collection Time: 05/28/24  9:14 PM  Result Value Ref Range   Glucose-Capillary 117 (H) 70 - 99 mg/dL  Renal function panel (daily at 0500)     Status: Abnormal   Collection Time: 05/29/24  2:34 AM  Result Value Ref Range   Sodium 135 135 - 145 mmol/L   Potassium 4.1 3.5 - 5.1 mmol/L   Chloride 97 (L) 98 - 111 mmol/L   CO2 20 (L) 22 - 32 mmol/L  Glucose, Bld 125 (H) 70 - 99 mg/dL   BUN 91 (H) 6 - 20 mg/dL   Creatinine, Ser 1.97 (H) 0.44 - 1.00 mg/dL   Calcium  7.9 (L) 8.9 - 10.3 mg/dL   Phosphorus 8.5 (H) 2.5 - 4.6 mg/dL   Albumin 2.0 (L) 3.5 - 5.0 g/dL   GFR, Estimated 5 (L) >60 mL/min   Anion gap 18 (H) 5 - 15  Magnesium     Status: None   Collection Time: 05/29/24  2:34 AM  Result Value Ref Range   Magnesium 2.4 1.7 - 2.4 mg/dL  CBC with Differential/Platelet     Status: Abnormal   Collection Time: 05/29/24  2:34 AM  Result Value Ref Range   WBC 11.4 (H) 4.0 - 10.5 K/uL   RBC 3.02 (L) 3.87 - 5.11 MIL/uL   Hemoglobin 7.3 (L) 12.0 - 15.0 g/dL   HCT 78.1 (L) 63.9 - 53.9 %   MCV 72.2 (L) 80.0 - 100.0 fL   MCH 24.2 (L) 26.0 - 34.0 pg   MCHC 33.5 30.0 - 36.0 g/dL   RDW 83.0 (H) 88.4 - 84.4 %   Platelets 339 150 - 400 K/uL   nRBC 0.2 0.0 - 0.2 %   Neutrophils Relative % 77 %   Neutro Abs 8.6 (H) 1.7 - 7.7 K/uL   Lymphocytes Relative 17 %   Lymphs Abs 1.9 0.7 - 4.0 K/uL   Monocytes Relative 5 %   Monocytes Absolute 0.6 0.1 - 1.0 K/uL   Eosinophils Relative 0 %   Eosinophils Absolute 0.0 0.0 - 0.5 K/uL   Basophils Relative 0 %    Basophils Absolute 0.1 0.0 - 0.1 K/uL   WBC Morphology MORPHOLOGY UNREMARKABLE    Smear Review Normal platelet morphology    Immature Granulocytes 1 %   Abs Immature Granulocytes 0.16 (H) 0.00 - 0.07 K/uL   Polychromasia PRESENT    Target Cells PRESENT   Glucose, capillary     Status: Abnormal   Collection Time: 05/29/24  6:16 AM  Result Value Ref Range   Glucose-Capillary 142 (H) 70 - 99 mg/dL  CBC     Status: Abnormal   Collection Time: 05/29/24  9:20 AM  Result Value Ref Range   WBC 10.5 4.0 - 10.5 K/uL   RBC 2.85 (L) 3.87 - 5.11 MIL/uL   Hemoglobin 7.0 (L) 12.0 - 15.0 g/dL   HCT 78.8 (L) 63.9 - 53.9 %   MCV 74.0 (L) 80.0 - 100.0 fL   MCH 24.6 (L) 26.0 - 34.0 pg   MCHC 33.2 30.0 - 36.0 g/dL   RDW 81.6 (H) 88.4 - 84.4 %   Platelets 337 150 - 400 K/uL   nRBC 0.2 0.0 - 0.2 %    I have reviewed pertinent nursing notes, vitals, labs, and images as necessary. I have ordered labwork to follow up on as indicated.  I have reviewed the last notes from staff over past 24 hours. I have discussed patient's care plan and test results with nursing staff, CM/SW, and other staff as appropriate.  Old records reviewed in assessment of this patient  Time spent: Greater than 50% of the 55 minute visit was spent in counseling/coordination of care for the patient as laid out in the A&P.   LOS: 11 days   Alm Apo, MD [facility] 05/29/2024, 2:02 PM

## 2024-05-29 NOTE — Plan of Care (Signed)

## 2024-05-30 DIAGNOSIS — A419 Sepsis, unspecified organism: Secondary | ICD-10-CM | POA: Diagnosis not present

## 2024-05-30 DIAGNOSIS — J9601 Acute respiratory failure with hypoxia: Secondary | ICD-10-CM | POA: Diagnosis not present

## 2024-05-30 DIAGNOSIS — E1110 Type 2 diabetes mellitus with ketoacidosis without coma: Secondary | ICD-10-CM | POA: Diagnosis not present

## 2024-05-30 DIAGNOSIS — N179 Acute kidney failure, unspecified: Secondary | ICD-10-CM | POA: Diagnosis not present

## 2024-05-30 LAB — CBC WITH DIFFERENTIAL/PLATELET
Abs Immature Granulocytes: 0.04 K/uL (ref 0.00–0.07)
Basophils Absolute: 0 K/uL (ref 0.0–0.1)
Basophils Relative: 0 %
Eosinophils Absolute: 0 K/uL (ref 0.0–0.5)
Eosinophils Relative: 0 %
HCT: 20.5 % — ABNORMAL LOW (ref 36.0–46.0)
Hemoglobin: 6.8 g/dL — CL (ref 12.0–15.0)
Immature Granulocytes: 1 %
Lymphocytes Relative: 15 %
Lymphs Abs: 1.2 K/uL (ref 0.7–4.0)
MCH: 24.5 pg — ABNORMAL LOW (ref 26.0–34.0)
MCHC: 33.2 g/dL (ref 30.0–36.0)
MCV: 73.7 fL — ABNORMAL LOW (ref 80.0–100.0)
Monocytes Absolute: 0.6 K/uL (ref 0.1–1.0)
Monocytes Relative: 7 %
Neutro Abs: 6.2 K/uL (ref 1.7–7.7)
Neutrophils Relative %: 77 %
Platelets: 333 K/uL (ref 150–400)
RBC: 2.78 MIL/uL — ABNORMAL LOW (ref 3.87–5.11)
RDW: 17.5 % — ABNORMAL HIGH (ref 11.5–15.5)
WBC: 8 K/uL (ref 4.0–10.5)
nRBC: 0.3 % — ABNORMAL HIGH (ref 0.0–0.2)

## 2024-05-30 LAB — RENAL FUNCTION PANEL
Albumin: 2.1 g/dL — ABNORMAL LOW (ref 3.5–5.0)
Anion gap: 15 (ref 5–15)
BUN: 49 mg/dL — ABNORMAL HIGH (ref 6–20)
CO2: 24 mmol/L (ref 22–32)
Calcium: 7.7 mg/dL — ABNORMAL LOW (ref 8.9–10.3)
Chloride: 95 mmol/L — ABNORMAL LOW (ref 98–111)
Creatinine, Ser: 5.82 mg/dL — ABNORMAL HIGH (ref 0.44–1.00)
GFR, Estimated: 8 mL/min — ABNORMAL LOW (ref >60)
Glucose, Bld: 140 mg/dL — ABNORMAL HIGH (ref 70–99)
Phosphorus: 6.8 mg/dL — ABNORMAL HIGH (ref 2.5–4.6)
Potassium: 4.1 mmol/L (ref 3.5–5.1)
Sodium: 134 mmol/L — ABNORMAL LOW (ref 135–145)

## 2024-05-30 LAB — STOOL CULTURE REFLEX - CMPCXR

## 2024-05-30 LAB — GLUCOSE, CAPILLARY
Glucose-Capillary: 141 mg/dL — ABNORMAL HIGH (ref 70–99)
Glucose-Capillary: 123 mg/dL — ABNORMAL HIGH (ref 70–99)
Glucose-Capillary: 172 mg/dL — ABNORMAL HIGH (ref 70–99)
Glucose-Capillary: 129 mg/dL — ABNORMAL HIGH (ref 70–99)

## 2024-05-30 LAB — CULTURE, BLOOD (ROUTINE X 2)
Culture: NO GROWTH
Culture: NO GROWTH
Special Requests: ADEQUATE

## 2024-05-30 LAB — STOOL CULTURE: E coli, Shiga toxin Assay: NEGATIVE

## 2024-05-30 LAB — MAGNESIUM: Magnesium: 2.2 mg/dL (ref 1.7–2.4)

## 2024-05-30 LAB — STOOL CULTURE REFLEX - RSASHR

## 2024-05-30 MED ORDER — DARBEPOETIN ALFA 150 MCG/0.3ML IJ SOSY
150.0000 ug | PREFILLED_SYRINGE | INTRAMUSCULAR | Status: DC
  Administered 2024-06-01: 150 ug via SUBCUTANEOUS
  Filled 2024-05-30: qty 0.3

## 2024-05-30 NOTE — Progress Notes (Signed)
 Progress Note    NYSHA KOPLIN   FMW:[id number]  DOB: 04-Sep-1965  DOA: 05/18/2024     12 PCP: Antonio Cyndee Jamee JONELLE, DO  Initial CC: flu like symptoms  Hospital Course: [name] is a 58 year old **Jehovah's Witness** female with past medical history of CAD status post MI (09/2020) s/p 1 coronary stent, T2DM, HTN, HLD who presented to [facility] ED on 05/18/2024 with 3-day history of rhinorrhea, fever, congestion, cough, shortness of breath, generalized weakness, and 1 day history of N/V.  In the ED, the patient was febrile to a Tmax of 102.6 F, tachycardic to 132, tachypneic at 28, BP 121/73, SpO2 89% on RA that improved to 95% on 2 L nasal cannula.   However patient continued to have increased work of breathing as well as in mild distress and was placed on BiPAP.  CBC showed leukocytosis of 14.3.  CMP remarkable for sodium 126, calcium  2.9, bicarb 12, blood glucose 524, creatinine 2.96 (baseline 0.8-1), AST 233, ALT 62, anion gap 24. BhB 1.30. BNP wnl. Initial high sensitive troponin was elevated to 1322 and ultimately peaked at 2036. COVID/flu/RSV negative.  PCCM was consulted and patient was admitted to the ICU for management of DKA, elevated troponins, AKI, and shortness of breath.   Assessment & Plan:   Acute kidney injury due to septic ATN - Patient was initially on CRRT - Dialysis schedule per nephrology - [facility] planned after further improvement in leukocytosis; IR has been consulted  - Tentative plan for tunneled cath placement on 05/30/2024  Microcytic anemia Anemia of chronic disease  - Likely combo of renal failure and iron deficiency -On Aranesp weekly, Sundays - venofer given 10/23 - Hgb 6.8 g/dL on 89/75; no active bleeding. Still combo of renal failure and HD filter; patient Jehovoah Witness and still declines PRBC. BP is stable   Severe sepsis due to PNA -resolved -Sepsis physiology has resolved. - Persistent leukocytosis.  WBC downtrending; will keep watching -  Completed doxycycline and Zosyn courses - Stool studies negative.  Okay for Imodium   Acute respiratory failure with hypoxia in the setting of bilateral multifocal pneumonia - resolved - Remains on room air - Patient has completed course of Zosyn and doxy  - Patient was initially on CRRT (stopped on 05/22/2024).   DMII DKA - resolved  - Continue Semglee  and NovoLog  - A1c of 9.9%.   Hypervolemic hyponatremia - Likely volume related. - Continue to optimize volume with hemodialysis/ultrafiltration   Acute metabolic acidosis - Related to acute kidney injury   Demand cardiac ischemia in the setting of septic shock Shock liver, improving Anemia and thrombocytopenia critical illness  Interval History:  Hemoglobin downtrending further today.  No active bleeding.  Still suspecting combo of renal failure and HD filter blood loss.  Otherwise doing okay and not symptomatic.  Still declines blood products. Tentative plan for HD cath placement Monday with radiology. Next dialysis due would be tomorrow.  Antimicrobials:   DVT prophylaxis:  heparin  injection 5,000 Units Start: 05/20/24 1500 SCDs Start: 05/18/24 0404   Code Status:   Code Status: Full Code  Mobility Assessment (Last 72 Hours)     Mobility Assessment     Row Name 05/29/24 2000 05/29/24 0740 05/28/24 2001 05/28/24 1600 05/28/24 1214   Does the patient have exclusion criteria? No - Perform mobility assessment No - Perform mobility assessment No - Perform mobility assessment -- --   What is the highest level of mobility based on the mobility assessment? Level 4 (  Ambulates with assistance) - Balance while stepping forward/back - Complete Level 4 (Ambulates with assistance) - Balance while stepping forward/back - Complete Level 4 (Ambulates with assistance) - Balance while stepping forward/back - Complete Level 3 (Stands with assistance) - Balance while standing  and cannot march in place Level 3 (Stands with assistance) -  Balance while standing  and cannot march in place   Is the above level different from baseline mobility prior to current illness? Yes - Recommend PT order Yes - Recommend PT order Yes - Recommend PT order -- --    Row Name 05/28/24 0745 05/27/24 1945         Does the patient have exclusion criteria? No - Perform mobility assessment No - Perform mobility assessment      What is the highest level of mobility based on the mobility assessment? Level 4 (Ambulates with assistance) - Balance while stepping forward/back - Complete Level 4 (Ambulates with assistance) - Balance while stepping forward/back - Complete      Is the above level different from baseline mobility prior to current illness? Yes - Recommend PT order Yes - Recommend PT order         Barriers to discharge: None Disposition Plan: Home HH orders placed: TBD Status is: Inpatient  Objective: Blood pressure 129/73, pulse 100, temperature 97.7 F (36.5 C), temperature source Oral, resp. rate 20, height 5' 5 (1.651 m), weight 90.9 kg, SpO2 100%.  Examination:  Physical Exam Constitutional:      Appearance: Normal appearance.  HENT:     Head: Normocephalic and atraumatic.     Mouth/Throat:     Mouth: Mucous membranes are moist.  Eyes:     Extraocular Movements: Extraocular movements intact.  Cardiovascular:     Rate and Rhythm: Normal rate and regular rhythm.  Pulmonary:     Effort: Pulmonary effort is normal. No respiratory distress.     Breath sounds: Normal breath sounds. No wheezing.  Abdominal:     General: Bowel sounds are normal. There is no distension.     Palpations: Abdomen is soft.     Tenderness: There is no abdominal tenderness.  Musculoskeletal:        General: Normal range of motion.     Cervical back: Normal range of motion and neck supple.  Skin:    General: Skin is warm and dry.  Neurological:     General: No focal deficit present.     Mental Status: She is alert.  Psychiatric:        Mood and  Affect: Mood normal.      Consultants:  Nephrology  Procedures:    Data Reviewed: Results for orders placed or performed during the hospital encounter of 05/18/24 (from the past 24 hours)  Glucose, capillary     Status: None   Collection Time: 05/29/24  2:44 PM  Result Value Ref Range   Glucose-Capillary 96 70 - 99 mg/dL  Glucose, capillary     Status: Abnormal   Collection Time: 05/29/24  5:08 PM  Result Value Ref Range   Glucose-Capillary 230 (H) 70 - 99 mg/dL  Glucose, capillary     Status: Abnormal   Collection Time: 05/29/24  9:24 PM  Result Value Ref Range   Glucose-Capillary 153 (H) 70 - 99 mg/dL   Comment 1 Notify RN   Magnesium     Status: None   Collection Time: 05/30/24  5:27 AM  Result Value Ref Range   Magnesium 2.2 1.7 - 2.4  mg/dL  CBC with Differential/Platelet     Status: Abnormal   Collection Time: 05/30/24  5:27 AM  Result Value Ref Range   WBC 8.0 4.0 - 10.5 K/uL   RBC 2.78 (L) 3.87 - 5.11 MIL/uL   Hemoglobin 6.8 (LL) 12.0 - 15.0 g/dL   HCT 79.4 (L) 63.9 - 53.9 %   MCV 73.7 (L) 80.0 - 100.0 fL   MCH 24.5 (L) 26.0 - 34.0 pg   MCHC 33.2 30.0 - 36.0 g/dL   RDW 82.4 (H) 88.4 - 84.4 %   Platelets 333 150 - 400 K/uL   nRBC 0.3 (H) 0.0 - 0.2 %   Neutrophils Relative % 77 %   Neutro Abs 6.2 1.7 - 7.7 K/uL   Lymphocytes Relative 15 %   Lymphs Abs 1.2 0.7 - 4.0 K/uL   Monocytes Relative 7 %   Monocytes Absolute 0.6 0.1 - 1.0 K/uL   Eosinophils Relative 0 %   Eosinophils Absolute 0.0 0.0 - 0.5 K/uL   Basophils Relative 0 %   Basophils Absolute 0.0 0.0 - 0.1 K/uL   Immature Granulocytes 1 %   Abs Immature Granulocytes 0.04 0.00 - 0.07 K/uL  Renal function panel     Status: Abnormal   Collection Time: 05/30/24  5:27 AM  Result Value Ref Range   Sodium 134 (L) 135 - 145 mmol/L   Potassium 4.1 3.5 - 5.1 mmol/L   Chloride 95 (L) 98 - 111 mmol/L   CO2 24 22 - 32 mmol/L   Glucose, Bld 140 (H) 70 - 99 mg/dL   BUN 49 (H) 6 - 20 mg/dL   Creatinine, Ser  4.17 (H) 0.44 - 1.00 mg/dL   Calcium  7.7 (L) 8.9 - 10.3 mg/dL   Phosphorus 6.8 (H) 2.5 - 4.6 mg/dL   Albumin 2.1 (L) 3.5 - 5.0 g/dL   GFR, Estimated 8 (L) >60 mL/min   Anion gap 15 5 - 15  Glucose, capillary     Status: Abnormal   Collection Time: 05/30/24  6:21 AM  Result Value Ref Range   Glucose-Capillary 141 (H) 70 - 99 mg/dL   Comment 1 Notify RN   Glucose, capillary     Status: Abnormal   Collection Time: 05/30/24 11:08 AM  Result Value Ref Range   Glucose-Capillary 123 (H) 70 - 99 mg/dL    I have reviewed pertinent nursing notes, vitals, labs, and images as necessary. I have ordered labwork to follow up on as indicated.  I have reviewed the last notes from staff over past 24 hours. I have discussed patient's care plan and test results with nursing staff, CM/SW, and other staff as appropriate.  Old records reviewed in assessment of this patient  Time spent: Greater than 50% of the 55 minute visit was spent in counseling/coordination of care for the patient as laid out in the A&P.   LOS: 12 days   Alm Apo, MD [facility] 05/30/2024, 2:04 PM

## 2024-05-30 NOTE — Progress Notes (Signed)
 Critical lab result received from lab. On call provider notified. No new orders at this time. Pt is a Jehovah witness.

## 2024-05-30 NOTE — Progress Notes (Signed)
 Danielle Flores Progress Note    Assessment/ Plan:    AKI - in setting of DKA.  UA with gluc, blood, and protein.  Negative nitrite and renal US  without obstruction.  Likely ischemic ATN in setting of volume depletion and acute illness as well as hypotension. S/p CRRT via temp line from 10/14-10/16/25. Transitioned to HD on 10/18 Plan for HD today (can go up to [facility]), will keep on TTS sched. No signs of renal recovery at this junction looking at trajectory of labs. IR following for [facility] placement when feasible Avoid nephrotoxic medications including NSAIDs and iodinated intravenous contrast exposure unless the latter is absolutely indicated.  Preferred narcotic agents for pain control are hydromorphone, fentanyl , and methadone. Morphine should not be used. Avoid Baclofen and avoid oral sodium phosphate and magnesium citrate based laxatives / bowel preps. Continue strict Input and Output monitoring. Will monitor the patient closely with you and intervene or adjust therapy as indicated by changes in clinical status/labs   Severe sepsis with septic shock due to RML pneumonia -s/p doxycycline.  Per primary service Hematuria: UA pending (collect when able), renal u/s 10/21 unrevealing, w/u per primary service Acute hypoxic respiratory failure - improved  Acute NSTEMI - on plavix  and being followed by Cardiology. Hyponatremia - due to DKA and AKI, will attempt to manage with HD Hypokalemia - due to DKA. resolved DKA - off insulin  drip per primary service Abnormal LFT's - likely shock liver.  Per primary Thrombocytopenia - due to acute critical illness--resolved AGMA - due to #1 and #7.  Metformin  stopped.  Improved with renal replacement therapy  Anemia - likely due to critical illness and AKI.  She will not accept blood transfusions (Jehovah's Witness).  ESA dose increased for tomorrow.  Receiving IV iron since she is off antibiotics.  Hemoglobin 6.8 today Severe protein malnutrition - alb  low.  protein supplements per RD.  Pending CIR.   Subjective:   Patient seen and examined bedside.  She reports that she did great with dialysis yesterday.  Net UF 1.5 L.   Objective:   BP (!) 116/55 (BP Location: Right Arm)   Pulse 99   Temp 98.5 F (36.9 C) (Oral)   Resp 19   Ht 5' 5 (1.651 m)   Wt 90.9 kg   LMP  (LMP Unknown)   SpO2 97%   BMI 33.35 kg/m   Intake/Output Summary (Last 24 hours) at 05/30/2024 0753 Last data filed at 05/29/2024 1845 Gross per 24 hour  Intake 110 ml  Output 1500 ml  Net -1390 ml    Weight change: -2.7 kg  Physical Exam: Gen: NAD, laying flat in bed CVS: RRR Resp: CTA B/L Jai:dnqu, nt/nd Ext: no edema Neuro: awake, alert Dialysis access: RIJ temp HD catheter c/d/i  Imaging: No results found.   Labs: BMET Recent Labs  Lab 05/24/24 0350 05/25/24 0340 05/26/24 0417 05/27/24 0519 05/28/24 0246 05/29/24 0234 05/30/24 0527  NA 133*  134* 131* 131* 133* 133* 135 134*  K 4.4  4.4 4.6 4.5 4.7 3.9 4.1 4.1  CL 98  99 94* 95* 97* 95* 97* 95*  CO2 20*  20* 23 18* 17* 22 20* 24  GLUCOSE 203*  203* 227* 154* 166* 90 125* 140*  BUN 83*  86* 58* 87* 124* 64* 91* 49*  CREATININE 6.64*  6.44* 5.27* 7.20* 9.30* 5.92* 8.02* 5.82*  CALCIUM  7.7*  7.8* 7.7* 7.9* 8.2* 7.9* 7.9* 7.7*  PHOS 3.2 3.3 4.6 6.6* 6.1* 8.5*  6.8*   CBC Recent Labs  Lab 05/28/24 0246 05/29/24 0234 05/29/24 0920 05/30/24 0527  WBC 15.2* 11.4* 10.5 8.0  NEUTROABS 12.0* 8.6*  --  6.2  HGB 7.3* 7.3* 7.0* 6.8*  HCT 21.4* 21.8* 21.1* 20.5*  MCV 71.1* 72.2* 74.0* 73.7*  PLT 290 339 337 333    Medications:     (feeding supplement) PROSource Plus  30 mL Oral BID BM   Chlorhexidine Gluconate Cloth  6 each Topical Q0600   clopidogrel   75 mg Oral Daily   [START ON 06/01/2024] darbepoetin (ARANESP) injection - DIALYSIS  150 mcg Subcutaneous Q Sun-1800   ezetimibe   10 mg Oral Daily   feeding supplement  237 mL Oral BID BM   Gerhardt's butt cream    Topical Daily   heparin  injection (subcutaneous)  5,000 Units Subcutaneous Q8H   insulin  aspart  0-15 Units Subcutaneous TID WC   insulin  aspart  0-5 Units Subcutaneous QHS   insulin  aspart  8 Units Subcutaneous TID WC   insulin  glargine-yfgn  22 Units Subcutaneous Daily   multivitamin  1 tablet Oral QHS      Ephriam Stank, MD [facility] 05/30/2024, 7:53 AM

## 2024-05-30 NOTE — Progress Notes (Signed)
 Inpatient Rehab Admissions Coordinator:    CIR following. Pt. Is not yet medically stable for CIR at this time. Note low Hgb and need for [facility] at some point.   Leita Kleine, MS, CCC-SLP Rehab Admissions Coordinator  [phone] (celll) [phone] (office)

## 2024-05-30 NOTE — Plan of Care (Signed)
   Problem: Coping: Goal: Ability to adjust to condition or change in health will improve Outcome: Progressing   Problem: Nutritional: Goal: Maintenance of adequate nutrition will improve Outcome: Progressing   Problem: Skin Integrity: Goal: Risk for impaired skin integrity will decrease Outcome: Progressing   Problem: Tissue Perfusion: Goal: Adequacy of tissue perfusion will improve Outcome: Progressing

## 2024-05-30 NOTE — Progress Notes (Signed)
 Physical Therapy Treatment Patient Details Name: Danielle Flores MRN: [id number] DOB: October 21, 1965 Today's Date: 05/30/2024   History of Present Illness 58 yo presenting to [facility] on 10/12 due to fever, rhinorrhea, congestion, SOB progressing over the past 3 days. Also with nausea and vomiting for 2 days. Pt found to be in acute respiratory failure with hypoxia in setting of bil multifocal pneumonia and severe sepsis with septic shock; complicated by AKI due to septic NSTEMI and DKA. Pt on CRRT off starting 10/16. Tentative plan for [facility] placement on Monday. PMH: DM II, hyperlipidemia, CAD, Jehovah's Witness.    PT Comments  Pt received in supine, c/o fatigue but agreeable to therapy session with encouragement. Pt needing up to minA to perform transfers and for stability with pre-gait standing exercises and lateral stepping to HOB/foot of bed with RW support. Pt quick to fatigue, standing <5 minutes before requiring seated rest breaks. Patient will benefit from intensive inpatient follow-up therapy, >3 hours/day, pt typically independent and working full time on her feet, currently requiring AD and external support for balance.    If plan is discharge home, recommend the following: Assist for transportation;Assistance with cooking/housework;Help with stairs or ramp for entrance;A little help with walking and/or transfers;A little help with bathing/dressing/bathroom   Can travel by private Theme park manager (measurements PT);Wheelchair cushion (measurements PT);Rolling walker (2 wheels);BSC/3in1    Recommendations for Other Services       Precautions / Restrictions Precautions Precautions: Fall Recall of Precautions/Restrictions: Intact Precaution/Restrictions Comments: blood on bed pad + anemia -notify RN if increased Restrictions Weight Bearing Restrictions Per Provider Order: No     Mobility  Bed Mobility Overal bed mobility: Needs Assistance Bed  Mobility: Supine to Sit, Sit to Supine     Supine to sit: Contact guard, HOB elevated, Used rails Sit to supine: Contact guard assist, HOB elevated, Used rails   General bed mobility comments: to R EOB, increased time, use of rail    Transfers Overall transfer level: Needs assistance Equipment used: Rolling walker (2 wheels) Transfers: Sit to/from Stand, Bed to chair/wheelchair/BSC Sit to Stand: Contact guard assist, Min assist   Step pivot transfers: Min assist       General transfer comment: CGA initially, minA for stand>sit due to pt fatigue and decreased eccentric control to sit; CGA for static standing and up to minA for stability with lateral stepping with RW along EOB/toward foot of bed, ~17ft total    Ambulation/Gait             Pre-gait activities: lateral stepping with RW past HOB and toward middle of bed, and marching x10 within RW, quick to fatigue 2/2 anemia     Stairs             Wheelchair Mobility     Tilt Bed    Modified Rankin (Stroke Patients Only)       Balance Overall balance assessment: Needs assistance Sitting-balance support: No upper extremity supported, Feet unsupported Sitting balance-Leahy Scale: Fair     Standing balance support: Bilateral upper extremity supported, During functional activity, Reliant on assistive device for balance Standing balance-Leahy Scale: Poor Standing balance comment: heavy reliant with RW                            Communication Communication Communication: No apparent difficulties  Cognition Arousal: Alert Behavior During Therapy: Flat affect   PT -  Cognitive impairments: No apparent impairments                       PT - Cognition Comments: Agreeable with encouragement, pt thankful for therapist efforts at end of session. Following commands: Intact      Cueing Cueing Techniques: Verbal cues, Gestural cues  Exercises Other Exercises Other Exercises: supine BLE AROM:  ankle pumps x5 reps, pt reports compliance with HEP Other Exercises: IS x 10 reps, pt achieves ~750 mL Other Exercises: standing BLE AROM: hip flexion, heel raises x10 reps ea (seated break between to rest) Other Exercises: seated BLE AROM: LAQ x10 reps ea    General Comments General comments (skin integrity, edema, etc.): BP 144/76 76 (94) sitting EOB SpO2 97-100% on 2L/min O2 [place]; standing BP 147/71 (91) HR 125 bpm standing; RN notified O2 weaned from 3L-2L.      Pertinent Vitals/Pain Pain Assessment Pain Assessment: Faces Faces Pain Scale: Hurts a little bit Pain Location: back and LE pain with standing Pain Descriptors / Indicators: Discomfort, Guarding Pain Intervention(s): Limited activity within patient's tolerance, Monitored during session, Repositioned    Home Living                          Prior Function            PT Goals (current goals can now be found in the care plan section) Acute Rehab PT Goals Patient Stated Goal: to return to home and work PT Goal Formulation: With patient Time For Goal Achievement: 06/04/24 Progress towards PT goals: Progressing toward goals    Frequency    Min 2X/week      PT Plan      Co-evaluation              AM-PAC PT 6 Clicks Mobility   Outcome Measure  Help needed turning from your back to your side while in a flat bed without using bedrails?: A Little Help needed moving from lying on your back to sitting on the side of a flat bed without using bedrails?: A Little Help needed moving to and from a bed to a chair (including a wheelchair)?: A Little Help needed standing up from a chair using your arms (e.g., wheelchair or bedside chair)?: A Little Help needed to walk in hospital room?: A Lot (+2 chair follow) Help needed climbing 3-5 steps with a railing? : Total 6 Click Score: 15    End of Session Equipment Utilized During Treatment: Gait belt;Oxygen Activity Tolerance: Patient tolerated treatment  well;Patient limited by fatigue Patient left: in bed;with call bell/phone within reach;with bed alarm set;Other (comment) (heels floated) Nurse Communication: Mobility status;Precautions;Other (comment) (pt has blood on bed pad under her that appears new, pad removed and extra pad left under her to absorb) PT Visit Diagnosis: Unsteadiness on feet (R26.81);Other abnormalities of gait and mobility (R26.89);Muscle weakness (generalized) (M62.81)     Time: 1520-1540 PT Time Calculation (min) (ACUTE ONLY): 20 min  Charges:    $Therapeutic Exercise: 8-22 mins PT General Charges $$ ACUTE PT VISIT: 1 Visit                     Tannie Koskela P., PTA Acute Rehabilitation Services Secure Chat Preferred 9a-5:30pm Office: [phone]    Connell HERO [facility] 05/30/2024, 3:53 PM

## 2024-05-30 NOTE — Progress Notes (Signed)
 PT Cancellation Note  Patient Details Name: Danielle Flores MRN: [id number] DOB: [birth year]   Cancelled Treatment:    Reason Eval/Treat Not Completed: (P) Patient declined, no reason specified (pt requesting to wait until later in the day; x2 visitors in room with her.) Will continue efforts per PT plan of care as schedule permits.   Hera Celaya M Lori Popowski 05/30/2024, 1:33 PM

## 2024-05-31 DIAGNOSIS — J9601 Acute respiratory failure with hypoxia: Secondary | ICD-10-CM | POA: Diagnosis not present

## 2024-05-31 DIAGNOSIS — N179 Acute kidney failure, unspecified: Secondary | ICD-10-CM | POA: Diagnosis not present

## 2024-05-31 DIAGNOSIS — A419 Sepsis, unspecified organism: Secondary | ICD-10-CM | POA: Diagnosis not present

## 2024-05-31 DIAGNOSIS — E1110 Type 2 diabetes mellitus with ketoacidosis without coma: Secondary | ICD-10-CM | POA: Diagnosis not present

## 2024-05-31 LAB — CBC WITH DIFFERENTIAL/PLATELET
Abs Immature Granulocytes: 0.03 K/uL (ref 0.00–0.07)
Basophils Absolute: 0.1 K/uL (ref 0.0–0.1)
Basophils Relative: 1 %
Eosinophils Absolute: 0 K/uL (ref 0.0–0.5)
Eosinophils Relative: 0 %
HCT: 22.3 % — ABNORMAL LOW (ref 36.0–46.0)
Hemoglobin: 7.4 g/dL — ABNORMAL LOW (ref 12.0–15.0)
Immature Granulocytes: 0 %
Lymphocytes Relative: 14 %
Lymphs Abs: 1.3 K/uL (ref 0.7–4.0)
MCH: 24.3 pg — ABNORMAL LOW (ref 26.0–34.0)
MCHC: 33.2 g/dL (ref 30.0–36.0)
MCV: 73.4 fL — ABNORMAL LOW (ref 80.0–100.0)
Monocytes Absolute: 0.5 K/uL (ref 0.1–1.0)
Monocytes Relative: 6 %
Neutro Abs: 7.3 K/uL (ref 1.7–7.7)
Neutrophils Relative %: 79 %
Platelets: 422 K/uL — ABNORMAL HIGH (ref 150–400)
RBC: 3.04 MIL/uL — ABNORMAL LOW (ref 3.87–5.11)
RDW: 18.1 % — ABNORMAL HIGH (ref 11.5–15.5)
WBC: 9.2 K/uL (ref 4.0–10.5)
nRBC: 0 % (ref 0.0–0.2)

## 2024-05-31 LAB — GLUCOSE, CAPILLARY
Glucose-Capillary: 153 mg/dL — ABNORMAL HIGH (ref 70–99)
Glucose-Capillary: 125 mg/dL — ABNORMAL HIGH (ref 70–99)
Glucose-Capillary: 110 mg/dL — ABNORMAL HIGH (ref 70–99)
Glucose-Capillary: 224 mg/dL — ABNORMAL HIGH (ref 70–99)

## 2024-05-31 LAB — RENAL FUNCTION PANEL
Albumin: 2.2 g/dL — ABNORMAL LOW (ref 3.5–5.0)
Anion gap: 16 — ABNORMAL HIGH (ref 5–15)
BUN: 65 mg/dL — ABNORMAL HIGH (ref 6–20)
CO2: 22 mmol/L (ref 22–32)
Calcium: 8.1 mg/dL — ABNORMAL LOW (ref 8.9–10.3)
Chloride: 98 mmol/L (ref 98–111)
Creatinine, Ser: 8.09 mg/dL — ABNORMAL HIGH (ref 0.44–1.00)
GFR, Estimated: 5 mL/min — ABNORMAL LOW (ref >60)
Glucose, Bld: 134 mg/dL — ABNORMAL HIGH (ref 70–99)
Phosphorus: 9.7 mg/dL — ABNORMAL HIGH (ref 2.5–4.6)
Potassium: 4.2 mmol/L (ref 3.5–5.1)
Sodium: 136 mmol/L (ref 135–145)

## 2024-05-31 LAB — MAGNESIUM: Magnesium: 2.4 mg/dL (ref 1.7–2.4)

## 2024-05-31 MED ORDER — HEPARIN SODIUM (PORCINE) 1000 UNIT/ML IJ SOLN
2400.0000 [IU] | Freq: Once | INTRAMUSCULAR | Status: AC
  Administered 2024-05-31: 2400 [IU]

## 2024-05-31 MED ORDER — HEPARIN SODIUM (PORCINE) 1000 UNIT/ML IJ SOLN
INTRAMUSCULAR | Status: AC
  Filled 2024-05-31: qty 3

## 2024-05-31 MED ORDER — SEVELAMER CARBONATE 800 MG PO TABS
800.0000 mg | ORAL_TABLET | Freq: Three times a day (TID) | ORAL | Status: DC
  Administered 2024-05-31 – 2024-06-03 (×5): 800 mg via ORAL
  Filled 2024-05-31 (×5): qty 1

## 2024-05-31 NOTE — Progress Notes (Signed)
 Progress Note    Danielle Flores   FMW:[id number]  DOB: November 03, 1965  DOA: 05/18/2024     13 PCP: Antonio Cyndee Jamee JONELLE, DO  Initial CC: flu like symptoms  Hospital Course: [name] is a 58 year old **Jehovah's Witness** female with past medical history of CAD status post MI (09/2020) s/p 1 coronary stent, T2DM, HTN, HLD who presented to [facility] ED on 05/18/2024 with 3-day history of rhinorrhea, fever, congestion, cough, shortness of breath, generalized weakness, and 1 day history of N/V.  In the ED, the patient was febrile to a Tmax of 102.6 F, tachycardic to 132, tachypneic at 28, BP 121/73, SpO2 89% on RA that improved to 95% on 2 L nasal cannula.   However patient continued to have increased work of breathing as well as in mild distress and was placed on BiPAP.  CBC showed leukocytosis of 14.3.  CMP remarkable for sodium 126, calcium  2.9, bicarb 12, blood glucose 524, creatinine 2.96 (baseline 0.8-1), AST 233, ALT 62, anion gap 24. BhB 1.30. BNP wnl. Initial high sensitive troponin was elevated to 1322 and ultimately peaked at 2036. COVID/flu/RSV negative.  PCCM was consulted and patient was admitted to the ICU for management of DKA, elevated troponins, AKI, and shortness of breath.   Assessment & Plan:   Acute kidney injury due to septic ATN - Patient was initially on CRRT - Dialysis schedule per nephrology - [facility] planned after further improvement in leukocytosis; IR has been consulted  - Tentative plan for tunneled cath placement now changed to Monday per IR - please measure urine output   Microcytic anemia Anemia of chronic disease  - Likely combo of renal failure and iron deficiency -On Aranesp weekly, Sundays - venofer given 10/23 - Hgb improved some this am; 7.4 g/dL. Still combo of renal failure and HD filter; patient Jehovoah Witness and still declines PRBC. BP is stable   Severe sepsis due to PNA -resolved -Sepsis physiology has resolved. - Persistent leukocytosis.  WBC  downtrending; will keep watching - Completed doxycycline and Zosyn courses - Stool studies negative.  Okay for Imodium   Acute respiratory failure with hypoxia in the setting of bilateral multifocal pneumonia - resolved - Remains on room air - Patient has completed course of Zosyn and doxy  - Patient was initially on CRRT (stopped on 05/22/2024).   DMII DKA - resolved  - Continue Semglee  and NovoLog  - A1c of 9.9%.   Hypervolemic hyponatremia - Likely volume related. - Continue to optimize volume with hemodialysis/ultrafiltration   Acute metabolic acidosis - Related to acute kidney injury   Demand cardiac ischemia in the setting of septic shock Shock liver, improving Anemia and thrombocytopenia critical illness  Interval History:  No events overnight.  Hemoglobin a little bit better this morning.  Still making urine. Planning for dialysis later today.  Antimicrobials:   DVT prophylaxis:  heparin  injection 5,000 Units Start: 05/20/24 1500 SCDs Start: 05/18/24 0404   Code Status:   Code Status: Full Code  Mobility Assessment (Last 72 Hours)     Mobility Assessment     Row Name 05/31/24 0715 05/30/24 2020 05/30/24 1500 05/30/24 0900 05/29/24 2000   Does the patient have exclusion criteria? No - Perform mobility assessment No - Perform mobility assessment -- No - Perform mobility assessment No - Perform mobility assessment   What is the highest level of mobility based on the mobility assessment? Level 4 (Ambulates with assistance) - Balance while stepping forward/back - Complete Level 4 (Ambulates with  assistance) - Balance while stepping forward/back - Complete Level 3 (Stands with assistance) - Balance while standing  and cannot march in place Level 4 (Ambulates with assistance) - Balance while stepping forward/back - Complete Level 4 (Ambulates with assistance) - Balance while stepping forward/back - Complete   Is the above level different from baseline mobility prior to  current illness? Yes - Recommend PT order Yes - Recommend PT order -- Yes - Recommend PT order Yes - Recommend PT order    Row Name 05/29/24 0740 05/28/24 2001 05/28/24 1600       Does the patient have exclusion criteria? No - Perform mobility assessment No - Perform mobility assessment --     What is the highest level of mobility based on the mobility assessment? Level 4 (Ambulates with assistance) - Balance while stepping forward/back - Complete Level 4 (Ambulates with assistance) - Balance while stepping forward/back - Complete Level 3 (Stands with assistance) - Balance while standing  and cannot march in place     Is the above level different from baseline mobility prior to current illness? Yes - Recommend PT order Yes - Recommend PT order --        Barriers to discharge: None Disposition Plan: Home HH orders placed: TBD Status is: Inpatient  Objective: Blood pressure 129/72, pulse (!) 110, temperature 97.8 F (36.6 C), resp. rate (!) 25, height 5' 5 (1.651 m), weight 90.5 kg, SpO2 100%.  Examination:  Physical Exam Constitutional:      Appearance: Normal appearance.  HENT:     Head: Normocephalic and atraumatic.     Mouth/Throat:     Mouth: Mucous membranes are moist.  Eyes:     Extraocular Movements: Extraocular movements intact.  Cardiovascular:     Rate and Rhythm: Normal rate and regular rhythm.  Pulmonary:     Effort: Pulmonary effort is normal. No respiratory distress.     Breath sounds: Normal breath sounds. No wheezing.  Abdominal:     General: Bowel sounds are normal. There is no distension.     Palpations: Abdomen is soft.     Tenderness: There is no abdominal tenderness.  Musculoskeletal:        General: Normal range of motion.     Cervical back: Normal range of motion and neck supple.  Skin:    General: Skin is warm and dry.  Neurological:     General: No focal deficit present.     Mental Status: She is alert.  Psychiatric:        Mood and Affect: Mood  normal.      Consultants:  Nephrology  Procedures:    Data Reviewed: Results for orders placed or performed during the hospital encounter of 05/18/24 (from the past 24 hours)  Glucose, capillary     Status: Abnormal   Collection Time: 05/30/24  4:24 PM  Result Value Ref Range   Glucose-Capillary 172 (H) 70 - 99 mg/dL  Glucose, capillary     Status: Abnormal   Collection Time: 05/30/24  9:24 PM  Result Value Ref Range   Glucose-Capillary 129 (H) 70 - 99 mg/dL  Renal function panel (daily at 0500)     Status: Abnormal   Collection Time: 05/31/24  5:38 AM  Result Value Ref Range   Sodium 136 135 - 145 mmol/L   Potassium 4.2 3.5 - 5.1 mmol/L   Chloride 98 98 - 111 mmol/L   CO2 22 22 - 32 mmol/L   Glucose, Bld 134 (H) 70 -  99 mg/dL   BUN 65 (H) 6 - 20 mg/dL   Creatinine, Ser 1.90 (H) 0.44 - 1.00 mg/dL   Calcium  8.1 (L) 8.9 - 10.3 mg/dL   Phosphorus 9.7 (H) 2.5 - 4.6 mg/dL   Albumin 2.2 (L) 3.5 - 5.0 g/dL   GFR, Estimated 5 (L) >60 mL/min   Anion gap 16 (H) 5 - 15  Magnesium     Status: None   Collection Time: 05/31/24  5:38 AM  Result Value Ref Range   Magnesium 2.4 1.7 - 2.4 mg/dL  CBC with Differential/Platelet     Status: Abnormal   Collection Time: 05/31/24  5:38 AM  Result Value Ref Range   WBC 9.2 4.0 - 10.5 K/uL   RBC 3.04 (L) 3.87 - 5.11 MIL/uL   Hemoglobin 7.4 (L) 12.0 - 15.0 g/dL   HCT 77.6 (L) 63.9 - 53.9 %   MCV 73.4 (L) 80.0 - 100.0 fL   MCH 24.3 (L) 26.0 - 34.0 pg   MCHC 33.2 30.0 - 36.0 g/dL   RDW 81.8 (H) 88.4 - 84.4 %   Platelets 422 (H) 150 - 400 K/uL   nRBC 0.0 0.0 - 0.2 %   Neutrophils Relative % 79 %   Neutro Abs 7.3 1.7 - 7.7 K/uL   Lymphocytes Relative 14 %   Lymphs Abs 1.3 0.7 - 4.0 K/uL   Monocytes Relative 6 %   Monocytes Absolute 0.5 0.1 - 1.0 K/uL   Eosinophils Relative 0 %   Eosinophils Absolute 0.0 0.0 - 0.5 K/uL   Basophils Relative 1 %   Basophils Absolute 0.1 0.0 - 0.1 K/uL   Immature Granulocytes 0 %   Abs Immature  Granulocytes 0.03 0.00 - 0.07 K/uL  Glucose, capillary     Status: Abnormal   Collection Time: 05/31/24  6:07 AM  Result Value Ref Range   Glucose-Capillary 153 (H) 70 - 99 mg/dL  Glucose, capillary     Status: Abnormal   Collection Time: 05/31/24 11:10 AM  Result Value Ref Range   Glucose-Capillary 125 (H) 70 - 99 mg/dL    I have reviewed pertinent nursing notes, vitals, labs, and images as necessary. I have ordered labwork to follow up on as indicated.  I have reviewed the last notes from staff over past 24 hours. I have discussed patient's care plan and test results with nursing staff, CM/SW, and other staff as appropriate.  Old records reviewed in assessment of this patient  Time spent: Greater than 50% of the 55 minute visit was spent in counseling/coordination of care for the patient as laid out in the A&P.   LOS: 13 days   Alm Apo, MD [facility] 05/31/2024, 3:25 PM

## 2024-05-31 NOTE — Plan of Care (Signed)
  Problem: Coping: Goal: Ability to adjust to condition or change in health will improve Outcome: Progressing   Problem: Nutritional: Goal: Maintenance of adequate nutrition will improve Outcome: Progressing   Problem: Skin Integrity: Goal: Risk for impaired skin integrity will decrease Outcome: Progressing   Problem: Tissue Perfusion: Goal: Adequacy of tissue perfusion will improve Outcome: Progressing   Problem: Education: Goal: Knowledge of General Education information will improve Description: Including pain rating scale, medication(s)/side effects and non-pharmacologic comfort measures Outcome: Progressing

## 2024-05-31 NOTE — Progress Notes (Signed)
 [facility] Progress Note    Assessment/ Plan:    AKI - in setting of DKA.  UA with gluc, blood, and protein.  Negative nitrite and renal US  without obstruction.  Likely ischemic ATN in setting of volume depletion and acute illness as well as hypotension. S/p CRRT via temp line from 10/14-10/16/25. Transitioned to HD on 10/18 Plan for HD today (can go up to [facility]), will keep on TTS sched. No signs of renal recovery at this junction however need STRICT I/O to look at her trajectory. IR following for [facility] placement when feasible Avoid nephrotoxic medications including NSAIDs and iodinated intravenous contrast exposure unless the latter is absolutely indicated.  Preferred narcotic agents for pain control are hydromorphone, fentanyl , and methadone. Morphine should not be used. Avoid Baclofen and avoid oral sodium phosphate and magnesium citrate based laxatives / bowel preps. Continue strict Input and Output monitoring. Will monitor the patient closely with you and intervene or adjust therapy as indicated by changes in clinical status/labs   Severe sepsis with septic shock due to RML pneumonia -s/p doxycycline.  Per primary service Hematuria: UA pending (collect when able), renal u/s 10/21 unrevealing, w/u per primary service Acute hypoxic respiratory failure - improved  Acute NSTEMI - on plavix  and being followed by Cardiology. Hyponatremia - due to DKA and AKI, will attempt to manage with HD Hypokalemia - due to DKA. resolved DKA - off insulin  drip per primary service Abnormal LFT's - likely shock liver.  Per primary Thrombocytopenia - due to acute critical illness--resolved AGMA - due to #1 and #7.  Metformin  stopped.  Improved with renal replacement therapy  Anemia - likely due to critical illness and AKI.  She will not accept blood transfusions (Jehovah's Witness).  ESA dose increased for 10/25.  Receiving IV iron since she is off antibiotics.  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  protein supplements per RD.  Pending CIR.   Subjective:   Patient seen and examined bedside.  She reports that she is urinating more, already urinated twice this AM, for some reason not being measured? Otherwise no complaints.   Objective:   BP 134/79 (BP Location: Left Arm)   Pulse (!) 105   Temp 98.1 F (36.7 C) (Oral)   Resp 19   Ht 5' 5 (1.651 m)   Wt 90.5 kg   LMP  (LMP Unknown)   SpO2 99%   BMI 33.20 kg/m   Intake/Output Summary (Last 24 hours) at 05/31/2024 0818 Last data filed at 05/31/2024 0731 Gross per 24 hour  Intake 120 ml  Output --  Net 120 ml    Weight change: -2.7 kg  Physical Exam: Gen: NAD, laying flat in bed CVS: RRR Resp: CTA B/L Jai:dnqu, nt/nd Ext: no edema Neuro: awake, alert Dialysis access: RIJ temp HD catheter c/d/i  Imaging: No results found.   Labs: BMET Recent Labs  Lab 05/25/24 0340 05/26/24 0417 05/27/24 0519 05/28/24 0246 05/29/24 0234 05/30/24 0527 05/31/24 0538  NA 131* 131* 133* 133* 135 134* 136  K 4.6 4.5 4.7 3.9 4.1 4.1 4.2  CL 94* 95* 97* 95* 97* 95* 98  CO2 23 18* 17* 22 20* 24 22  GLUCOSE 227* 154* 166* 90 125* 140* 134*  BUN 58* 87* 124* 64* 91* 49* 65*  CREATININE 5.27* 7.20* 9.30* 5.92* 8.02* 5.82* 8.09*  CALCIUM  7.7* 7.9* 8.2* 7.9* 7.9* 7.7* 8.1*  PHOS 3.3 4.6 6.6* 6.1*  8.5* 6.8* 9.7*   CBC Recent Labs  Lab 05/28/24 0246 05/29/24 0234 05/29/24 0920 05/30/24 0527 05/31/24 0538  WBC 15.2* 11.4* 10.5 8.0 9.2  NEUTROABS 12.0* 8.6*  --  6.2 7.3  HGB 7.3* 7.3* 7.0* 6.8* 7.4*  HCT 21.4* 21.8* 21.1* 20.5* 22.3*  MCV 71.1* 72.2* 74.0* 73.7* 73.4*  PLT 290 339 337 333 422*    Medications:     (feeding supplement) PROSource Plus  30 mL Oral BID BM   Chlorhexidine Gluconate Cloth  6 each Topical Q0600   clopidogrel   75 mg Oral Daily   [START ON 06/01/2024] darbepoetin (ARANESP) injection - DIALYSIS  150 mcg Subcutaneous Q  Sun-1800   ezetimibe   10 mg Oral Daily   feeding supplement  237 mL Oral BID BM   Gerhardt's butt cream   Topical Daily   heparin  injection (subcutaneous)  5,000 Units Subcutaneous Q8H   insulin  aspart  0-15 Units Subcutaneous TID WC   insulin  aspart  0-5 Units Subcutaneous QHS   insulin  aspart  8 Units Subcutaneous TID WC   insulin  glargine-yfgn  22 Units Subcutaneous Daily   multivitamin  1 tablet Oral QHS      Ephriam Stank, MD [facility] 05/31/2024, 8:18 AM

## 2024-05-31 NOTE — Progress Notes (Signed)
 Received patient in bed to unit.  Alert and oriented.  Informed consent signed and in chart.   TX duration: 3.25  Patient tolerated well.  Transported back to the room  Alert, without acute distress.  Hand-off given to patient's nurse.   Access used: PC Access issues: KINKING, CARTIDGE CHANGED DURING TREATMENT  Total UF removed: 2000 ML Medication(s) given: NONE, VENOFER RESCHEDULED DUE TO MEDICATION NOT BEING PRESENT, FLOOR/PRIMARY NURSE TO BE MADE AWARE IN POST HANDOFF REPORT Post HD weight: 91.5 KG Post HD VS: SEE BELOW INSERTED DATA   Danielle Flores [name]  05/31/24 1724  Vitals  Temp 98 F (36.7 C)  BP 114/67  Pulse Rate (!) 110  Resp (!) 30  Weight 91.5 kg  Type of Weight Post-Dialysis  Oxygen Therapy  SpO2 100 %  O2 Device Nasal Cannula  O2 Flow Rate (L/min) 2 L/min  Patient Activity (if Appropriate) In bed  Pulse Oximetry Type Continuous  Post Treatment  Dialyzer Clearance Clear  Liters Processed 77.6  Fluid Removed (mL) 2000 mL  Tolerated HD Treatment Yes

## 2024-05-31 NOTE — Procedures (Signed)
 I was present at this dialysis session. I have reviewed the session itself and made appropriate changes.   Filed Weights   05/29/24 1356 05/30/24 0518 05/31/24 0401  Weight: 92.2 kg 90.9 kg 90.5 kg    Recent Labs  Lab 05/31/24 0538  NA 136  K 4.2  CL 98  CO2 22  GLUCOSE 134*  BUN 65*  CREATININE 8.09*  CALCIUM  8.1*  PHOS 9.7*    Recent Labs  Lab 05/29/24 0234 05/29/24 0920 05/30/24 0527 05/31/24 0538  WBC 11.4* 10.5 8.0 9.2  NEUTROABS 8.6*  --  6.2 7.3  HGB 7.3* 7.0* 6.8* 7.4*  HCT 21.8* 21.1* 20.5* 22.3*  MCV 72.2* 74.0* 73.7* 73.4*  PLT 339 337 333 422*    Scheduled Meds:  (feeding supplement) PROSource Plus  30 mL Oral BID BM   Chlorhexidine Gluconate Cloth  6 each Topical Q0600   clopidogrel   75 mg Oral Daily   [START ON 06/01/2024] darbepoetin (ARANESP) injection - DIALYSIS  150 mcg Subcutaneous Q Sun-1800   ezetimibe   10 mg Oral Daily   feeding supplement  237 mL Oral BID BM   Gerhardt's butt cream   Topical Daily   heparin  injection (subcutaneous)  5,000 Units Subcutaneous Q8H   insulin  aspart  0-15 Units Subcutaneous TID WC   insulin  aspart  0-5 Units Subcutaneous QHS   insulin  aspart  8 Units Subcutaneous TID WC   insulin  glargine-yfgn  22 Units Subcutaneous Daily   multivitamin  1 tablet Oral QHS   sevelamer carbonate  800 mg Oral TID WC   Continuous Infusions:  iron sucrose Stopped (05/29/24 1622)   PRN Meds:.acetaminophen, bismuth subsalicylate, dextrose , docusate sodium, lip balm, loperamide, ondansetron  (ZOFRAN ) IV, mouth rinse   Ephriam Stank, MD [facility] 05/31/2024, 3:59 PM

## 2024-05-31 NOTE — Plan of Care (Signed)

## 2024-06-01 DIAGNOSIS — N179 Acute kidney failure, unspecified: Secondary | ICD-10-CM | POA: Diagnosis not present

## 2024-06-01 DIAGNOSIS — D509 Iron deficiency anemia, unspecified: Secondary | ICD-10-CM

## 2024-06-01 LAB — CBC WITH DIFFERENTIAL/PLATELET
Abs Immature Granulocytes: 0.03 K/uL (ref 0.00–0.07)
Basophils Absolute: 0.1 K/uL (ref 0.0–0.1)
Basophils Relative: 1 %
Eosinophils Absolute: 0 K/uL (ref 0.0–0.5)
Eosinophils Relative: 1 %
HCT: 20.7 % — ABNORMAL LOW (ref 36.0–46.0)
Hemoglobin: 6.8 g/dL — CL (ref 12.0–15.0)
Immature Granulocytes: 0 %
Lymphocytes Relative: 19 %
Lymphs Abs: 1.3 K/uL (ref 0.7–4.0)
MCH: 24.6 pg — ABNORMAL LOW (ref 26.0–34.0)
MCHC: 32.9 g/dL (ref 30.0–36.0)
MCV: 75 fL — ABNORMAL LOW (ref 80.0–100.0)
Monocytes Absolute: 0.5 K/uL (ref 0.1–1.0)
Monocytes Relative: 7 %
Neutro Abs: 4.9 K/uL (ref 1.7–7.7)
Neutrophils Relative %: 72 %
Platelets: 336 K/uL (ref 150–400)
RBC: 2.76 MIL/uL — ABNORMAL LOW (ref 3.87–5.11)
RDW: 18.7 % — ABNORMAL HIGH (ref 11.5–15.5)
WBC: 6.8 K/uL (ref 4.0–10.5)
nRBC: 0 % (ref 0.0–0.2)

## 2024-06-01 LAB — RENAL FUNCTION PANEL
Albumin: 2.3 g/dL — ABNORMAL LOW (ref 3.5–5.0)
Anion gap: 15 (ref 5–15)
BUN: 30 mg/dL — ABNORMAL HIGH (ref 6–20)
CO2: 24 mmol/L (ref 22–32)
Calcium: 7.9 mg/dL — ABNORMAL LOW (ref 8.9–10.3)
Chloride: 95 mmol/L — ABNORMAL LOW (ref 98–111)
Creatinine, Ser: 5.65 mg/dL — ABNORMAL HIGH (ref 0.44–1.00)
GFR, Estimated: 8 mL/min — ABNORMAL LOW (ref >60)
Glucose, Bld: 155 mg/dL — ABNORMAL HIGH (ref 70–99)
Phosphorus: 6.5 mg/dL — ABNORMAL HIGH (ref 2.5–4.6)
Potassium: 4 mmol/L (ref 3.5–5.1)
Sodium: 134 mmol/L — ABNORMAL LOW (ref 135–145)

## 2024-06-01 LAB — GLUCOSE, CAPILLARY
Glucose-Capillary: 126 mg/dL — ABNORMAL HIGH (ref 70–99)
Glucose-Capillary: 84 mg/dL (ref 70–99)
Glucose-Capillary: 134 mg/dL — ABNORMAL HIGH (ref 70–99)
Glucose-Capillary: 88 mg/dL (ref 70–99)

## 2024-06-01 LAB — MAGNESIUM: Magnesium: 2.1 mg/dL (ref 1.7–2.4)

## 2024-06-01 MED ORDER — CEFAZOLIN SODIUM-DEXTROSE 2-4 GM/100ML-% IV SOLN: 2.0000 g | INTRAVENOUS | Status: AC

## 2024-06-01 NOTE — Progress Notes (Signed)
 Progress Note    Danielle Flores   FMW:[id number]  DOB: 12/29/1965  DOA: 05/18/2024     14 PCP: Flores Cyndee Jamee JONELLE, DO  Initial CC: flu like symptoms  Hospital Course: Danielle Flores is a 58 year old **Jehovah's Witness** female with past medical history of CAD status post MI (09/2020) s/p 1 coronary stent, T2DM, HTN, HLD who presented to [facility] ED on 05/18/2024 with 3-day history of rhinorrhea, fever, congestion, cough, shortness of breath, generalized weakness, and 1 day history of N/V.  In the ED, the patient was febrile to a Tmax of 102.6 F, tachycardic to 132, tachypneic at 28, BP 121/73, SpO2 89% on RA that improved to 95% on 2 L nasal cannula.   However patient continued to have increased work of breathing as well as in mild distress and was placed on BiPAP.  CBC showed leukocytosis of 14.3.  CMP remarkable for sodium 126, calcium  2.9, bicarb 12, blood glucose 524, creatinine 2.96 (baseline 0.8-1), AST 233, ALT 62, anion gap 24. BhB 1.30. BNP wnl. Initial high sensitive troponin was elevated to 1322 and ultimately peaked at 2036. COVID/flu/RSV negative.  PCCM was consulted and patient was admitted to the ICU for management of DKA, elevated troponins, AKI, and shortness of breath.   Assessment & Plan:   Acute kidney injury due to septic ATN - Patient was initially on CRRT - Dialysis schedule per nephrology - [facility] planned after further improvement in leukocytosis; IR has been consulted  - Tentative plan for tunneled cath placement now changed to Monday per IR - please measure urine output  - sounds like maybe eventual pause in HD to see if renal fxn has returned but not planned for inpatient; patient may be leaving hospital while still having HD; tentative for CIR but if planned for outpt then needs CLIP initiated; deferred to nephrology  Microcytic anemia Anemia of chronic disease  - Likely combo of renal failure and iron deficiency -On Aranesp weekly, Sundays - venofer given  10/23 - Still combo of renal failure and HD filter; patient Jehovoah Witness and still declines PRBC. BP is stable  - Hemoglobin slightly down again, 6.8 g/dL this morning  Severe sepsis due to PNA -resolved -Sepsis physiology has resolved. - Persistent leukocytosis.  WBC downtrending; will keep watching - Completed doxycycline and Zosyn courses - Stool studies negative.  Okay for Imodium   Acute respiratory failure with hypoxia in the setting of bilateral multifocal pneumonia - resolved - Remains on room air - Patient has completed course of Zosyn and doxy  - Patient was initially on CRRT (stopped on 05/22/2024).   DMII DKA - resolved  - Continue Semglee  and NovoLog  - A1c of 9.9%.   Hypervolemic hyponatremia - Likely volume related. - Continue to optimize volume with hemodialysis/ultrafiltration   Acute metabolic acidosis - Related to acute kidney injury   Demand cardiac ischemia in the setting of septic shock Shock liver, improving Anemia and thrombocytopenia critical illness  Interval History:  No events overnight.  Feels okay and had no concerns this morning. If accurate, urine output total yesterday 250 cc.  Antimicrobials:   DVT prophylaxis:  heparin  injection 5,000 Units Start: 05/20/24 1500 SCDs Start: 05/18/24 0404   Code Status:   Code Status: Full Code  Mobility Assessment (Last 72 Hours)     Mobility Assessment     Row Name 06/01/24 0715 05/31/24 2005 05/31/24 0715 05/30/24 2020 05/30/24 1500   Does the patient have exclusion criteria? No - Perform mobility assessment  No - Perform mobility assessment No - Perform mobility assessment No - Perform mobility assessment --   What is the highest level of mobility based on the mobility assessment? Level 4 (Ambulates with assistance) - Balance while stepping forward/back - Complete Level 4 (Ambulates with assistance) - Balance while stepping forward/back - Complete Level 4 (Ambulates with assistance) - Balance  while stepping forward/back - Complete Level 4 (Ambulates with assistance) - Balance while stepping forward/back - Complete Level 3 (Stands with assistance) - Balance while standing  and cannot march in place   Is the above level different from baseline mobility prior to current illness? Yes - Recommend PT order Yes - Recommend PT order Yes - Recommend PT order Yes - Recommend PT order --    Row Name 05/30/24 0900 05/29/24 2000         Does the patient have exclusion criteria? No - Perform mobility assessment No - Perform mobility assessment      What is the highest level of mobility based on the mobility assessment? Level 4 (Ambulates with assistance) - Balance while stepping forward/back - Complete Level 4 (Ambulates with assistance) - Balance while stepping forward/back - Complete      Is the above level different from baseline mobility prior to current illness? Yes - Recommend PT order Yes - Recommend PT order         Barriers to discharge: None Disposition Plan: Home HH orders placed: TBD Status is: Inpatient  Objective: Blood pressure 123/77, pulse (!) 105, temperature 98.2 F (36.8 C), temperature source Oral, resp. rate 18, height 5' 5 (1.651 m), weight 88.5 kg, SpO2 98%.  Examination:  Physical Exam Constitutional:      Appearance: Normal appearance.  HENT:     Head: Normocephalic and atraumatic.     Mouth/Throat:     Mouth: Mucous membranes are moist.  Eyes:     Extraocular Movements: Extraocular movements intact.  Cardiovascular:     Rate and Rhythm: Normal rate and regular rhythm.  Pulmonary:     Effort: Pulmonary effort is normal. No respiratory distress.     Breath sounds: Normal breath sounds. No wheezing.  Abdominal:     General: Bowel sounds are normal. There is no distension.     Palpations: Abdomen is soft.     Tenderness: There is no abdominal tenderness.  Musculoskeletal:        General: Normal range of motion.     Cervical back: Normal range of motion  and neck supple.  Skin:    General: Skin is warm and dry.  Neurological:     General: No focal deficit present.     Mental Status: She is alert.  Psychiatric:        Mood and Affect: Mood normal.      Consultants:  Nephrology  Procedures:    Data Reviewed: Results for orders placed or performed during the hospital encounter of 05/18/24 (from the past 24 hours)  Glucose, capillary     Status: Abnormal   Collection Time: 05/31/24  6:02 PM  Result Value Ref Range   Glucose-Capillary 110 (H) 70 - 99 mg/dL  Glucose, capillary     Status: Abnormal   Collection Time: 05/31/24  9:35 PM  Result Value Ref Range   Glucose-Capillary 224 (H) 70 - 99 mg/dL  Glucose, capillary     Status: Abnormal   Collection Time: 06/01/24  6:03 AM  Result Value Ref Range   Glucose-Capillary 126 (H) 70 - 99 mg/dL  Renal function panel (daily at 0500)     Status: Abnormal   Collection Time: 06/01/24  8:21 AM  Result Value Ref Range   Sodium 134 (L) 135 - 145 mmol/L   Potassium 4.0 3.5 - 5.1 mmol/L   Chloride 95 (L) 98 - 111 mmol/L   CO2 24 22 - 32 mmol/L   Glucose, Bld 155 (H) 70 - 99 mg/dL   BUN 30 (H) 6 - 20 mg/dL   Creatinine, Ser 4.34 (H) 0.44 - 1.00 mg/dL   Calcium  7.9 (L) 8.9 - 10.3 mg/dL   Phosphorus 6.5 (H) 2.5 - 4.6 mg/dL   Albumin 2.3 (L) 3.5 - 5.0 g/dL   GFR, Estimated 8 (L) >60 mL/min   Anion gap 15 5 - 15  Magnesium     Status: None   Collection Time: 06/01/24  8:21 AM  Result Value Ref Range   Magnesium 2.1 1.7 - 2.4 mg/dL  CBC with Differential/Platelet     Status: Abnormal   Collection Time: 06/01/24  8:21 AM  Result Value Ref Range   WBC 6.8 4.0 - 10.5 K/uL   RBC 2.76 (L) 3.87 - 5.11 MIL/uL   Hemoglobin 6.8 (LL) 12.0 - 15.0 g/dL   HCT 79.2 (L) 63.9 - 53.9 %   MCV 75.0 (L) 80.0 - 100.0 fL   MCH 24.6 (L) 26.0 - 34.0 pg   MCHC 32.9 30.0 - 36.0 g/dL   RDW 81.2 (H) 88.4 - 84.4 %   Platelets 336 150 - 400 K/uL   nRBC 0.0 0.0 - 0.2 %   Neutrophils Relative % 72 %   Neutro  Abs 4.9 1.7 - 7.7 K/uL   Lymphocytes Relative 19 %   Lymphs Abs 1.3 0.7 - 4.0 K/uL   Monocytes Relative 7 %   Monocytes Absolute 0.5 0.1 - 1.0 K/uL   Eosinophils Relative 1 %   Eosinophils Absolute 0.0 0.0 - 0.5 K/uL   Basophils Relative 1 %   Basophils Absolute 0.1 0.0 - 0.1 K/uL   Immature Granulocytes 0 %   Abs Immature Granulocytes 0.03 0.00 - 0.07 K/uL    I have reviewed pertinent nursing notes, vitals, labs, and images as necessary. I have ordered labwork to follow up on as indicated.  I have reviewed the last notes from staff over past 24 hours. I have discussed patient's care plan and test results with nursing staff, CM/SW, and other staff as appropriate.  Old records reviewed in assessment of this patient  Time spent: Greater than 50% of the 55 minute visit was spent in counseling/coordination of care for the patient as laid out in the A&P.   LOS: 14 days   Alm Apo, MD [facility] 06/01/2024, 11:33 AM

## 2024-06-01 NOTE — Plan of Care (Signed)
  Problem: Coping: Goal: Ability to adjust to condition or change in health will improve Outcome: Progressing   Problem: Metabolic: Goal: Ability to maintain appropriate glucose levels will improve Outcome: Progressing   Problem: Skin Integrity: Goal: Risk for impaired skin integrity will decrease Outcome: Progressing   Problem: Tissue Perfusion: Goal: Adequacy of tissue perfusion will improve Outcome: Progressing   Problem: Education: Goal: Knowledge of General Education information will improve Description: Including pain rating scale, medication(s)/side effects and non-pharmacologic comfort measures Outcome: Progressing

## 2024-06-01 NOTE — Progress Notes (Addendum)
 Mobility Specialist: Progress Note   06/01/24 1400  Mobility  Activity Ambulated with assistance  Level of Assistance Contact guard assist, steadying assist  Assistive Device Front wheel walker  Distance Ambulated (ft) 90 ft  Activity Response Tolerated well  Mobility Referral Yes  Mobility visit 1 Mobility  Mobility Specialist Start Time (ACUTE ONLY) 1105  Mobility Specialist Stop Time (ACUTE ONLY) 1118  Mobility Specialist Time Calculation (min) (ACUTE ONLY) 13 min    Pt received in bed, agreeable to mobility session. Husband present and helpful. Received on RA, SpO2 98%. C/o fatigue. ModI for bed mobility. CGA for ambulation. Husband provided with chair follow but it was not utilized. Distance limited d/t increased fatigue, HR 142. Returned to room. Left in bed with all needs met, call bell in reach. Requested additional session later.   Ileana Lute Mobility Specialist Please contact via SecureChat or Rehab office at [phone]

## 2024-06-01 NOTE — Progress Notes (Signed)
 Interventional Radiology Brief Note:  Danielle Flores is a 71 year Jehovah's witness with acute kidney dysfunction with delayed renal recovery.  She is now on HD via temp HD catheter with anticipated need for HD at discharge.  IR consulted for tunneled HD cath for which she has been consented.  Ongoing monitoring for improvement in medical status prior to bringing to IR for procedure.  She appears to have plateaued with Hgb 6.8, mild tachycardia. Her leukocytosis has resolved. Will make NPO p MN for possible placement in IR as schedule allows.  SQ heparin  held tomorrow AM/PM out of an abundance of caution.   Danielle Ferrone, MS RD PA-C

## 2024-06-01 NOTE — Plan of Care (Signed)

## 2024-06-01 NOTE — Progress Notes (Signed)
 Mobility Specialist: Progress Note   06/01/24 1446  Mobility  Activity Ambulated with assistance  Level of Assistance Standby assist, set-up cues, supervision of patient - no hands on  Assistive Device Front wheel walker  Distance Ambulated (ft) 115 ft  Activity Response Tolerated well  Mobility Referral Yes  Mobility visit 1 Mobility  Mobility Specialist Start Time (ACUTE ONLY) 1415  Mobility Specialist Stop Time (ACUTE ONLY) 1426  Mobility Specialist Time Calculation (min) (ACUTE ONLY) 11 min    Pt received in bed, eager for additional mobility session. Feeling much better now after eating and getting more rest. SV for ambulation. Chair follow taken but not utilized. SpO2 96-99% on RA throughout. Returned to room. Left in bed with all needs met, call bell in reach. Husband present.   Ileana Lute Mobility Specialist Please contact via SecureChat or Rehab office at [phone]

## 2024-06-01 NOTE — Progress Notes (Signed)
 [facility] Progress Note    Assessment/ Plan:    AKI - in setting of DKA & ischemic ATN in setting of volume depletion and acute illness as well as hypotension. Baseline Cr ~1. UA with gluc, blood, and protein.  Negative nitrite and renal US  without obstruction. S/p CRRT via temp line from 10/14-10/16/25. Transitioned to HD on 10/18 HD on TTS sched, can come up to [facility]. No signs of renal recovery at this junction however need STRICT I/O to look at her trajectory. IR following for [facility] placement when feasible (there has been limiting factors in the last week i.e. leukocytosis, anemia/tachycardia) Elveria and I had a lengthy conversation in regards to long-term plan.  I did let her know that the hope is to liberalize her from dialysis but may take longer than expected.  Will continue to monitor for renal recovery.  I did give her a heads up that she may be leaving the hospital on dialysis and she is understanding of this Avoid nephrotoxic medications including NSAIDs and iodinated intravenous contrast exposure unless the latter is absolutely indicated.  Preferred narcotic agents for pain control are hydromorphone, fentanyl , and methadone. Morphine should not be used. Avoid Baclofen and avoid oral sodium phosphate and magnesium citrate based laxatives / bowel preps. Continue strict Input and Output monitoring. Will monitor the patient closely with you and intervene or adjust therapy as indicated by changes in clinical status/labs   Severe sepsis with septic shock due to RML pneumonia -s/p doxycycline.  Per primary service Hematuria: UA pending (collect when able), renal u/s 10/21 unrevealing, w/u per primary service Acute hypoxic respiratory failure - improved  Acute NSTEMI - on plavix  and being followed by Cardiology. Hyponatremia - due to DKA and AKI, will attempt to manage with HD Hypokalemia - due to DKA. resolved DKA - off insulin  drip per primary service Abnormal LFT's - likely  shock liver.  Per primary Thrombocytopenia - due to acute critical illness--resolved AGMA - due to #1 and #7.  Metformin  stopped.  Improved with renal replacement therapy  Anemia - likely due to critical illness and AKI.  She will not accept blood transfusions (Jehovah's Witness).  ESA dose increased for 10/25.  Receiving IV iron since she is off antibiotics.  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  protein supplements per RD.  Pending CIR.  Discussed with husband at the bedside.   Subjective:   Patient seen and examined bedside.  She reports that she continues to urinate: Urine output charted: 200 cc with 2 unmeasured voids.  She reports that she tolerated dialysis yesterday, net UF 2 L. Nasal cannula has been bothering her nose, she is not sure why she is on O2.  I removed her nasal cannula and she satting 97 to 100% on room air and with conversing. Otherwise no complaints. Husband in room.   Objective:   BP 123/77 (BP Location: Left Arm)   Pulse (!) 105   Temp 98.2 F (36.8 C) (Oral)   Resp 18   Ht 5' 5 (1.651 m)   Wt 88.5 kg   LMP  (LMP Unknown)   SpO2 98%   BMI 32.47 kg/m   Intake/Output Summary (Last 24 hours) at 06/01/2024 0752 Last data filed at 05/31/2024 1804 Gross per 24 hour  Intake --  Output 2200 ml  Net -2200 ml    Weight change: 1 kg  Physical Exam: Gen: NAD, laying flat in  bed CVS: RRR Resp: CTA B/L, normal wob on RA Jai:dnqu, nt/nd Ext: no edema Neuro: awake, alert Dialysis access: RIJ temp HD catheter c/d/i  Imaging: No results found.   Labs: BMET Recent Labs  Lab 05/26/24 0417 05/27/24 0519 05/28/24 0246 05/29/24 0234 05/30/24 0527 05/31/24 0538  NA 131* 133* 133* 135 134* 136  K 4.5 4.7 3.9 4.1 4.1 4.2  CL 95* 97* 95* 97* 95* 98  CO2 18* 17* 22 20* 24 22  GLUCOSE 154* 166* 90 125* 140* 134*  BUN 87* 124* 64* 91* 49* 65*  CREATININE 7.20* 9.30* 5.92* 8.02* 5.82*  8.09*  CALCIUM  7.9* 8.2* 7.9* 7.9* 7.7* 8.1*  PHOS 4.6 6.6* 6.1* 8.5* 6.8* 9.7*   CBC Recent Labs  Lab 05/28/24 0246 05/29/24 0234 05/29/24 0920 05/30/24 0527 05/31/24 0538  WBC 15.2* 11.4* 10.5 8.0 9.2  NEUTROABS 12.0* 8.6*  --  6.2 7.3  HGB 7.3* 7.3* 7.0* 6.8* 7.4*  HCT 21.4* 21.8* 21.1* 20.5* 22.3*  MCV 71.1* 72.2* 74.0* 73.7* 73.4*  PLT 290 339 337 333 422*    Medications:     (feeding supplement) PROSource Plus  30 mL Oral BID BM   Chlorhexidine Gluconate Cloth  6 each Topical Q0600   clopidogrel   75 mg Oral Daily   darbepoetin (ARANESP) injection - DIALYSIS  150 mcg Subcutaneous Q Sun-1800   ezetimibe   10 mg Oral Daily   feeding supplement  237 mL Oral BID BM   Gerhardt's butt cream   Topical Daily   heparin  injection (subcutaneous)  5,000 Units Subcutaneous Q8H   insulin  aspart  0-15 Units Subcutaneous TID WC   insulin  aspart  0-5 Units Subcutaneous QHS   insulin  aspart  8 Units Subcutaneous TID WC   insulin  glargine-yfgn  22 Units Subcutaneous Daily   multivitamin  1 tablet Oral QHS   sevelamer carbonate  800 mg Oral TID WC      Ephriam Stank, MD [facility] 06/01/2024, 7:52 AM

## 2024-06-02 ENCOUNTER — Inpatient Hospital Stay (HOSPITAL_COMMUNITY)

## 2024-06-02 DIAGNOSIS — N179 Acute kidney failure, unspecified: Secondary | ICD-10-CM | POA: Diagnosis not present

## 2024-06-02 HISTORY — PX: IR TUNNELED CENTRAL VENOUS CATH PLC W IMG: IMG1939

## 2024-06-02 LAB — CBC WITH DIFFERENTIAL/PLATELET
Abs Immature Granulocytes: 0.02 K/uL (ref 0.00–0.07)
Basophils Absolute: 0.1 K/uL (ref 0.0–0.1)
Basophils Relative: 1 %
Eosinophils Absolute: 0.1 K/uL (ref 0.0–0.5)
Eosinophils Relative: 1 %
HCT: 21.3 % — ABNORMAL LOW (ref 36.0–46.0)
Hemoglobin: 7 g/dL — ABNORMAL LOW (ref 12.0–15.0)
Immature Granulocytes: 0 %
Lymphocytes Relative: 20 %
Lymphs Abs: 1.3 K/uL (ref 0.7–4.0)
MCH: 24.6 pg — ABNORMAL LOW (ref 26.0–34.0)
MCHC: 32.9 g/dL (ref 30.0–36.0)
MCV: 74.7 fL — ABNORMAL LOW (ref 80.0–100.0)
Monocytes Absolute: 0.6 K/uL (ref 0.1–1.0)
Monocytes Relative: 9 %
Neutro Abs: 4.5 K/uL (ref 1.7–7.7)
Neutrophils Relative %: 69 %
Platelets: 382 K/uL (ref 150–400)
RBC: 2.85 MIL/uL — ABNORMAL LOW (ref 3.87–5.11)
RDW: 18.9 % — ABNORMAL HIGH (ref 11.5–15.5)
WBC: 6.6 K/uL (ref 4.0–10.5)
nRBC: 0 % (ref 0.0–0.2)

## 2024-06-02 LAB — RENAL FUNCTION PANEL
Albumin: 2.3 g/dL — ABNORMAL LOW (ref 3.5–5.0)
Anion gap: 18 — ABNORMAL HIGH (ref 5–15)
BUN: 39 mg/dL — ABNORMAL HIGH (ref 6–20)
CO2: 21 mmol/L — ABNORMAL LOW (ref 22–32)
Calcium: 8.2 mg/dL — ABNORMAL LOW (ref 8.9–10.3)
Chloride: 98 mmol/L (ref 98–111)
Creatinine, Ser: 7.27 mg/dL — ABNORMAL HIGH (ref 0.44–1.00)
GFR, Estimated: 6 mL/min — ABNORMAL LOW (ref >60)
Glucose, Bld: 114 mg/dL — ABNORMAL HIGH (ref 70–99)
Phosphorus: 8.8 mg/dL — ABNORMAL HIGH (ref 2.5–4.6)
Potassium: 4.1 mmol/L (ref 3.5–5.1)
Sodium: 137 mmol/L (ref 135–145)

## 2024-06-02 LAB — GLUCOSE, CAPILLARY
Glucose-Capillary: 128 mg/dL — ABNORMAL HIGH (ref 70–99)
Glucose-Capillary: 228 mg/dL — ABNORMAL HIGH (ref 70–99)
Glucose-Capillary: 125 mg/dL — ABNORMAL HIGH (ref 70–99)
Glucose-Capillary: 72 mg/dL (ref 70–99)

## 2024-06-02 LAB — MAGNESIUM: Magnesium: 2.1 mg/dL (ref 1.7–2.4)

## 2024-06-02 MED ORDER — MIDAZOLAM HCL (PF) 2 MG/2ML IJ SOLN
INTRAMUSCULAR | Status: AC | PRN
  Administered 2024-06-02 (×3): .5 mg via INTRAVENOUS

## 2024-06-02 MED ORDER — CEFAZOLIN SODIUM-DEXTROSE 2-4 GM/100ML-% IV SOLN
INTRAVENOUS | Status: AC
  Filled 2024-06-02: qty 100

## 2024-06-02 MED ORDER — MIDAZOLAM HCL 2 MG/2ML IJ SOLN
INTRAMUSCULAR | Status: AC
Start: 2024-06-02 — End: 2024-06-02
  Filled 2024-06-02: qty 2

## 2024-06-02 MED ORDER — HEPARIN SODIUM (PORCINE) 1000 UNIT/ML IJ SOLN
INTRAMUSCULAR | Status: AC
  Filled 2024-06-02: qty 10

## 2024-06-02 MED ORDER — FENTANYL CITRATE (PF) 100 MCG/2ML IJ SOLN
INTRAMUSCULAR | Status: AC | PRN
  Administered 2024-06-02 (×2): 25 ug via INTRAVENOUS

## 2024-06-02 MED ORDER — CEFAZOLIN SODIUM-DEXTROSE 2-4 GM/100ML-% IV SOLN
INTRAVENOUS | Status: AC | PRN
  Administered 2024-06-02: 2 g via INTRAVENOUS

## 2024-06-02 MED ORDER — FENTANYL CITRATE (PF) 100 MCG/2ML IJ SOLN
INTRAMUSCULAR | Status: AC
  Filled 2024-06-02: qty 2

## 2024-06-02 MED ORDER — GELATIN ABSORBABLE 12-7 MM EX MISC
CUTANEOUS | Status: AC
  Filled 2024-06-02: qty 1

## 2024-06-02 MED ORDER — LIDOCAINE-EPINEPHRINE 1 %-1:100000 IJ SOLN
INTRAMUSCULAR | Status: AC
  Filled 2024-06-02: qty 1

## 2024-06-02 MED ORDER — LIDOCAINE-EPINEPHRINE 1 %-1:100000 IJ SOLN
20.0000 mL | Freq: Once | INTRAMUSCULAR | Status: DC
  Filled 2024-06-02: qty 20

## 2024-06-02 NOTE — TOC Progression Note (Signed)
 Transition of Care [facility]) - Progression Note    Patient Details  Name: Danielle Flores MRN: [id number] Date of Birth: 1965-08-19  [facility]) CM/SW Contact  Roxie KANDICE Stain, RN Phone Number: 06/02/2024, 4:07 PM  Clinical Narrative:     This NCM was notified by Rehab Admission coordinator, Leita Kleine, that insurance may not approve CIR. This NCM spoke to patient who feels like she has progressed to a level she is comfortable to discharge home with no outpatient PT or PT. Patient declines both home health and Out patient rehab. Patient is agreeable to walker. Notified Zack with adapt of walker order through hub.  ICM (Inpatient Care Management) will continue to follow for needs.   Expected Discharge Plan: IP Rehab Facility Barriers to Discharge: English As A Second Language Teacher, Continued Medical Work up               Expected Discharge Plan and Services   Discharge Planning Services: CM Consult Post Acute Care Choice: IP Rehab Living arrangements for the past 2 months: Single Family Home                                       Social Drivers of Health (SDOH) Interventions SDOH Screenings   Food Insecurity: No Food Insecurity (05/19/2024)  Housing: Low Risk  (05/19/2024)  Transportation Needs: No Transportation Needs (05/19/2024)  Utilities: Not At Risk (05/19/2024)  Alcohol Screen: Low Risk  (02/22/2024)  Depression (PHQ2-9): Low Risk  (02/25/2024)  Financial Resource Strain: Low Risk  (02/22/2024)  Physical Activity: Insufficiently Active (02/22/2024)  Social Connections: Moderately Integrated (02/22/2024)  Stress: No Stress Concern Present (02/22/2024)  Tobacco Use: Low Risk  (05/20/2024)    Readmission Risk Interventions     No data to display

## 2024-06-02 NOTE — Progress Notes (Signed)
 Progress Note    Danielle Flores   FMW:[id number]  DOB: 1966-06-30  DOA: 05/18/2024     15 PCP: Antonio Cyndee Jamee JONELLE, DO  Initial CC: flu like symptoms  Hospital Course: Danielle Flores is a 58 year old **Jehovah's Witness** female with past medical history of CAD status post MI (09/2020) s/p 1 coronary stent, T2DM, HTN, HLD who presented to [facility] ED on 05/18/2024 with 3-day history of rhinorrhea, fever, congestion, cough, shortness of breath, generalized weakness, and 1 day history of N/V.  In the ED, the patient was febrile to a Tmax of 102.6 F, tachycardic to 132, tachypneic at 28, BP 121/73, SpO2 89% on RA that improved to 95% on 2 L nasal cannula.   However patient continued to have increased work of breathing as well as in mild distress and was placed on BiPAP.  CBC showed leukocytosis of 14.3.  CMP remarkable for sodium 126, calcium  2.9, bicarb 12, blood glucose 524, creatinine 2.96 (baseline 0.8-1), AST 233, ALT 62, anion gap 24. BhB 1.30. BNP wnl. Initial high sensitive troponin was elevated to 1322 and ultimately peaked at 2036. COVID/flu/RSV negative.  PCCM was consulted and patient was admitted to the ICU for management of DKA, elevated troponins, AKI, and shortness of breath.   Assessment & Plan:   Acute kidney injury due to septic ATN - Patient was initially on CRRT - Dialysis schedule per nephrology - [facility] (2-Rh) placed 10/27 - Labs still suggest dialysis dependent.  Continue measuring urine output; currently approximately 200 cc daily - Stable for transferring to CIR when able  Microcytic anemia Anemia of chronic disease  - Likely combo of renal failure and iron deficiency -On Aranesp weekly, Sundays - venofer given 10/23 - Still combo of renal failure and HD filter; patient Jehovoah Witness and still declines PRBC. BP is stable  - Hemoglobin low but stable  Severe sepsis due to PNA -resolved -Sepsis physiology has resolved. - Persistent leukocytosis.  WBC downtrending; will  keep watching - Completed doxycycline and Zosyn courses - Stool studies negative.  Okay for Imodium   Acute respiratory failure with hypoxia in the setting of bilateral multifocal pneumonia - resolved - Remains on room air - Patient has completed course of Zosyn and doxy  - Patient was initially on CRRT (stopped on 05/22/2024).   DMII DKA - resolved  - Continue Semglee  and NovoLog  - A1c of 9.9%.   Hypervolemic hyponatremia - Likely volume related. - Continue to optimize volume with hemodialysis/ultrafiltration   Acute metabolic acidosis - Related to acute kidney injury   Demand cardiac ischemia in the setting of septic shock Shock liver, improving Anemia and thrombocytopenia critical illness  Interval History:  Successful placement of dialysis catheter this morning with radiology.  She was tearful when seen afterwards due to ongoing need for dialysis.  Tried to encourage her we still do not know full dependency yet. She is medically stable at this time for going to rehab if and when able.  Antimicrobials:   DVT prophylaxis:  heparin  injection 5,000 Units Start: 05/20/24 1500 SCDs Start: 05/18/24 0404   Code Status:   Code Status: Full Code  Mobility Assessment (Last 72 Hours)     Mobility Assessment     Row Name 06/01/24 1945 06/01/24 0715 05/31/24 2005 05/31/24 0715 05/30/24 2020   Does the patient have exclusion criteria? No - Perform mobility assessment No - Perform mobility assessment No - Perform mobility assessment No - Perform mobility assessment No - Perform mobility assessment  What is the highest level of mobility based on the mobility assessment? Level 4 (Ambulates with assistance) - Balance while stepping forward/back - Complete Level 4 (Ambulates with assistance) - Balance while stepping forward/back - Complete Level 4 (Ambulates with assistance) - Balance while stepping forward/back - Complete Level 4 (Ambulates with assistance) - Balance while stepping  forward/back - Complete Level 4 (Ambulates with assistance) - Balance while stepping forward/back - Complete   Is the above level different from baseline mobility prior to current illness? Yes - Recommend PT order Yes - Recommend PT order Yes - Recommend PT order Yes - Recommend PT order Yes - Recommend PT order    Row Name 05/30/24 1500           What is the highest level of mobility based on the mobility assessment? Level 3 (Stands with assistance) - Balance while standing  and cannot march in place          Barriers to discharge: None Disposition Plan: ?CIR HH orders placed: TBD Status is: Inpatient  Objective: Blood pressure 123/67, pulse (!) 108, temperature 98.4 F (36.9 C), temperature source Oral, resp. rate 12, height 5' 5 (1.651 m), weight 88 kg, SpO2 99%.  Examination:  Physical Exam Constitutional:      Appearance: Normal appearance.  HENT:     Head: Normocephalic and atraumatic.     Mouth/Throat:     Mouth: Mucous membranes are moist.  Eyes:     Extraocular Movements: Extraocular movements intact.  Cardiovascular:     Rate and Rhythm: Normal rate and regular rhythm.  Pulmonary:     Effort: Pulmonary effort is normal. No respiratory distress.     Breath sounds: Normal breath sounds. No wheezing.  Abdominal:     General: Bowel sounds are normal. There is no distension.     Palpations: Abdomen is soft.     Tenderness: There is no abdominal tenderness.  Musculoskeletal:        General: Normal range of motion.     Cervical back: Normal range of motion and neck supple.  Skin:    General: Skin is warm and dry.  Neurological:     General: No focal deficit present.     Mental Status: She is alert.  Psychiatric:        Mood and Affect: Mood normal.      Consultants:  Nephrology  Procedures:  06/02/2024: Tunneled right IJ dialysis catheter  Data Reviewed: Results for orders placed or performed during the hospital encounter of 05/18/24 (from the past 24  hours)  Glucose, capillary     Status: None   Collection Time: 06/01/24 11:41 AM  Result Value Ref Range   Glucose-Capillary 84 70 - 99 mg/dL  Glucose, capillary     Status: Abnormal   Collection Time: 06/01/24  4:17 PM  Result Value Ref Range   Glucose-Capillary 134 (H) 70 - 99 mg/dL  Glucose, capillary     Status: None   Collection Time: 06/01/24  9:07 PM  Result Value Ref Range   Glucose-Capillary 88 70 - 99 mg/dL  Renal function panel (daily at 0500)     Status: Abnormal   Collection Time: 06/02/24  3:29 AM  Result Value Ref Range   Sodium 137 135 - 145 mmol/L   Potassium 4.1 3.5 - 5.1 mmol/L   Chloride 98 98 - 111 mmol/L   CO2 21 (L) 22 - 32 mmol/L   Glucose, Bld 114 (H) 70 - 99 mg/dL  BUN 39 (H) 6 - 20 mg/dL   Creatinine, Ser 2.72 (H) 0.44 - 1.00 mg/dL   Calcium  8.2 (L) 8.9 - 10.3 mg/dL   Phosphorus 8.8 (H) 2.5 - 4.6 mg/dL   Albumin 2.3 (L) 3.5 - 5.0 g/dL   GFR, Estimated 6 (L) >60 mL/min   Anion gap 18 (H) 5 - 15  Magnesium     Status: None   Collection Time: 06/02/24  3:29 AM  Result Value Ref Range   Magnesium 2.1 1.7 - 2.4 mg/dL  CBC with Differential/Platelet     Status: Abnormal   Collection Time: 06/02/24  3:29 AM  Result Value Ref Range   WBC 6.6 4.0 - 10.5 K/uL   RBC 2.85 (L) 3.87 - 5.11 MIL/uL   Hemoglobin 7.0 (L) 12.0 - 15.0 g/dL   HCT 78.6 (L) 63.9 - 53.9 %   MCV 74.7 (L) 80.0 - 100.0 fL   MCH 24.6 (L) 26.0 - 34.0 pg   MCHC 32.9 30.0 - 36.0 g/dL   RDW 81.0 (H) 88.4 - 84.4 %   Platelets 382 150 - 400 K/uL   nRBC 0.0 0.0 - 0.2 %   Neutrophils Relative % 69 %   Neutro Abs 4.5 1.7 - 7.7 K/uL   Lymphocytes Relative 20 %   Lymphs Abs 1.3 0.7 - 4.0 K/uL   Monocytes Relative 9 %   Monocytes Absolute 0.6 0.1 - 1.0 K/uL   Eosinophils Relative 1 %   Eosinophils Absolute 0.1 0.0 - 0.5 K/uL   Basophils Relative 1 %   Basophils Absolute 0.1 0.0 - 0.1 K/uL   Immature Granulocytes 0 %   Abs Immature Granulocytes 0.02 0.00 - 0.07 K/uL  Glucose, capillary      Status: Abnormal   Collection Time: 06/02/24  6:24 AM  Result Value Ref Range   Glucose-Capillary 128 (H) 70 - 99 mg/dL    I have reviewed pertinent nursing notes, vitals, labs, and images as necessary. I have ordered labwork to follow up on as indicated.  I have reviewed the last notes from staff over past 24 hours. I have discussed patient's care plan and test results with nursing staff, CM/SW, and other staff as appropriate.  Old records reviewed in assessment of this patient  Time spent: Greater than 50% of the 55 minute visit was spent in counseling/coordination of care for the patient as laid out in the A&P.   LOS: 15 days   Alm Apo, MD [facility] 06/02/2024, 10:12 AM

## 2024-06-02 NOTE — Procedures (Signed)
 Interventional Radiology Procedure:   Indications: AKI  Procedure: Tunneled dialysis catheter placement  Findings: Right internal jugular Palindrome catheter, 19 cm, tip at SVC/RA junction.  Removal of temp cath.    Complications: None     EBL: Minimal  Plan:  AKI   Danielle Jewel R. Philip, MD  Pager: [phone]

## 2024-06-02 NOTE — Plan of Care (Signed)

## 2024-06-02 NOTE — Progress Notes (Signed)
 Physical Therapy Treatment Patient Details Name: Danielle Flores MRN: [id number] DOB: 18-Aug-1965 Today's Date: 06/02/2024   History of Present Illness 58 yo presenting to [facility] on 10/12 due to fever, rhinorrhea, congestion, SOB progressing over the past 3 days. Also with nausea and vomiting for 2 days. Pt found to be in acute respiratory failure with hypoxia in setting of bil multifocal pneumonia and severe sepsis with septic shock; complicated by AKI due to septic NSTEMI and DKA. Pt on CRRT off starting 10/16. TDC placed on 10/27 AM. PMH: DM II, hyperlipidemia, CAD, Jehovah's Witness.    PT Comments  Pt received in supine, agreeable to therapy session with encouragement, initially reluctant. Once agreeable, pt able to perform bed mobility modI, transfers with Supervision for safety and gait with up to CGA, chair follow for safety per patient request. Pt HR to 145 bpm with exertion, she defers seated break in chair and HR did not decrease significantly with standing break. HR decreases to ~112 bpm at rest, BP WFL in chair at end of session. Patient will benefit from intensive inpatient follow-up therapy, >3 hours/day, she does not use AD at baseline and works full time, currently pt making progress but remains reliant on assistive device and has decreased activity tolerance from baseline. Plan to work on stair negotiation next session if HR less elevated.     If plan is discharge home, recommend the following: Assist for transportation;Assistance with cooking/housework;Help with stairs or ramp for entrance;A little help with walking and/or transfers;A little help with bathing/dressing/bathroom   Can travel by private Theme Park Manager (measurements PT);Wheelchair cushion (measurements PT);Rolling walker (2 wheels);BSC/3in1 (pending progress, may not need WC)    Recommendations for Other Services       Precautions / Restrictions Precautions Precautions:  Fall Recall of Precautions/Restrictions: Intact Precaution/Restrictions Comments: tachy, has had some bleeding on bed pad Restrictions Weight Bearing Restrictions Per Provider Order: No     Mobility  Bed Mobility Overal bed mobility: Needs Assistance Bed Mobility: Supine to Sit     Supine to sit: Modified independent (Device/Increase time)     General bed mobility comments: to L EOB, good initiation    Transfers Overall transfer level: Needs assistance Equipment used: Rolling walker (2 wheels) Transfers: Sit to/from Stand Sit to Stand: Supervision           General transfer comment: EOB>RW and RW>chair    Ambulation/Gait Ambulation/Gait assistance: Contact guard assist, Supervision, +2 safety/equipment Gait Distance (Feet): 75 Feet (x2 with standing break) Assistive device: Rolling walker (2 wheels) Gait Pattern/deviations: Step-through pattern, Decreased stride length, Decreased step length - left, Drifts right/left, Trunk flexed Gait velocity: variable, grossly 0.4-0.6 m/s   Pre-gait activities: standing hip flexion at RW x10 reps x 3 sets General Gait Details: PTA offered her cane as she does not use AD baseline, but pt defers, requesting RW for stability. Pt requesting chair follow, but defers seated break when prompted by PTA when HR >140. Pt took standing break for ~1 minute and HR decreased to ~138 bpm, pt requesting to return to room rather than sitting. HR max ~145 bpm. No LOB or buckling, good RW management, fair pace today. Pt requesting to have mobility specialist assist her to ambulate later in the day.   Stairs             Wheelchair Mobility     Tilt Bed    Modified Rankin (Stroke Patients Only)  Balance Overall balance assessment: Needs assistance Sitting-balance support: No upper extremity supported, Feet unsupported Sitting balance-Leahy Scale: Good     Standing balance support: Bilateral upper extremity supported, During  functional activity, Reliant on assistive device for balance Standing balance-Leahy Scale: Poor Standing balance comment: light UE RW support today; pt defers balance assessment without RW                            Communication Communication Communication: No apparent difficulties  Cognition Arousal: Alert Behavior During Therapy: Flat affect (irritable)   PT - Cognitive impairments: No apparent impairments                       PT - Cognition Comments: Pt states I already did that referring to ambulating in hallway, although this was previous date; Pt agreeable to participate with encouragement. Following commands: Intact      Cueing Cueing Techniques: Verbal cues  Exercises Other Exercises Other Exercises: standing BLE AROM: hip flexion x10 reps x3 sets pt performs unprompted upon standing    General Comments General comments (skin integrity, edema, etc.): BP 132/64 in recliner post-exertion, HR ~112 bpm resting and to ~145 bpm with exertion, SpO2 96-100% on RA. No dizziness or c/o weakness with postural changes.      Pertinent Vitals/Pain Pain Assessment Pain Assessment: 0-10 Pain Score: 4  Pain Location: neck where TDC placed Pain Descriptors / Indicators: Discomfort, Guarding, Grimacing, Sore Pain Intervention(s): Limited activity within patient's tolerance, Monitored during session, Premedicated before session, Repositioned, Other (comment) (RN notified pt asking if tape around [facility] site can be removed or made less tight)    Home Living                          Prior Function            PT Goals (current goals can now be found in the care plan section) Acute Rehab PT Goals Patient Stated Goal: To return to home and work PT Goal Formulation: With patient Time For Goal Achievement: 06/04/24 Progress towards PT goals: Progressing toward goals    Frequency    Min 2X/week      PT Plan      Co-evaluation               AM-PAC PT 6 Clicks Mobility   Outcome Measure  Help needed turning from your back to your side while in a flat bed without using bedrails?: None Help needed moving from lying on your back to sitting on the side of a flat bed without using bedrails?: None Help needed moving to and from a bed to a chair (including a wheelchair)?: A Little Help needed standing up from a chair using your arms (e.g., wheelchair or bedside chair)?: A Little Help needed to walk in hospital room?: A Lot (+2 chair follow) Help needed climbing 3-5 steps with a railing? : A Lot 6 Click Score: 18    End of Session Equipment Utilized During Treatment: Gait belt Activity Tolerance: Patient tolerated treatment well;Other (comment) (tachy HR) Patient left: with call bell/phone within reach;in chair;with chair alarm set Nurse Communication: Mobility status;Other (comment) (pt asking if [facility] dressing can be loosened or removed, PTA defer to RN) PT Visit Diagnosis: Unsteadiness on feet (R26.81);Other abnormalities of gait and mobility (R26.89);Muscle weakness (generalized) (M62.81)     Time: 8946-8884 PT Time Calculation (min) (ACUTE ONLY):  22 min  Charges:    $Gait Training: 8-22 mins PT General Charges $$ ACUTE PT VISIT: 1 Visit                     Hector Venne P., PTA Acute Rehabilitation Services Secure Chat Preferred 9a-5:30pm Office: [phone]    Connell HERO [facility] 06/02/2024, 11:34 AM

## 2024-06-02 NOTE — Progress Notes (Signed)
 [facility] Progress Note    Assessment/ Plan:    AKI - in setting of DKA & ischemic ATN in setting of volume depletion and acute illness as well as hypotension. Baseline Cr ~1. UA with gluc, blood, and protein.  Negative nitrite and renal US  without obstruction. S/p CRRT via temp line from 10/14-10/16/25. Transitioned to HD on 10/18  Appreciate VIR placing the right IJ [facility] 10/27 and removing the temp cath HD on TTS sched, can come up to [facility]. No signs of renal recovery at this junction however need STRICT I/O to look at her trajectory. She knows the hope is to liberalize her from dialysis but may take longer than expected.  Will continue to monitor for renal recovery.  I did give her a heads up that she may be leaving the hospital on dialysis and she is understanding of this.  Avoid nephrotoxic medications including NSAIDs and iodinated intravenous contrast exposure unless the latter is absolutely indicated.  Preferred narcotic agents for pain control are hydromorphone, fentanyl , and methadone. Morphine should not be used. Avoid Baclofen and avoid oral sodium phosphate and magnesium citrate based laxatives / bowel preps. Continue strict Input and Output monitoring. Will monitor the patient closely with you and intervene or adjust therapy as indicated by changes in clinical status/labs   Severe sepsis with septic shock due to RML pneumonia -s/p doxycycline.  Per primary service Hematuria: UA pending (collect when able), renal u/s 10/21 unrevealing, w/u per primary service Acute hypoxic respiratory failure - improved  Acute NSTEMI - on plavix  and being followed by Cardiology. Hyponatremia - due to DKA and AKI, will attempt to manage with HD Hypokalemia - due to DKA. resolved DKA - off insulin  drip per primary service Abnormal LFT's - likely shock liver.  Per primary Thrombocytopenia - due to acute critical illness--resolved AGMA - due to #1 and #7.  Metformin  stopped.  Improved with  renal replacement therapy  Anemia - likely due to critical illness and AKI.  She will not accept blood transfusions (Jehovah's Witness).  ESA dose increased for 10/25.  Receiving IV iron since she is off antibiotics.  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  protein supplements per RD.  Pending CIR.  Discussed with husband at the bedside.   Subjective:   Patient seen and examined bedside.  She reports that she continues to urinate but objectively inadequate.   Otherwise no complaints; denies fever, chills, nausea, shortness of breath.  She is very tired after the procedure to place the tunneled catheter..    Objective:   BP 123/67 (BP Location: Left Arm)   Pulse (!) 108   Temp 98.4 F (36.9 C) (Oral)   Resp 12   Ht 5' 5 (1.651 m)   Wt 88 kg   LMP  (LMP Unknown)   SpO2 99%   BMI 32.28 kg/m   Intake/Output Summary (Last 24 hours) at 06/02/2024 1033 Last data filed at 06/02/2024 0322 Gross per 24 hour  Intake 120 ml  Output 200 ml  Net -80 ml    Weight change: -3.5 kg  Physical Exam: Gen: NAD, laying flat in bed CVS: RRR Resp: CTA B/L, normal wob on RA Jai:dnqu, nt/nd Ext: no edema Neuro: awake, alert Dialysis access: RIJ tunneled HD catheter c/d/i  Imaging: No results found.   Labs: [employer] 05/27/24 9480 05/28/24 0246 05/29/24 0234 05/30/24 9472 05/31/24 9461 06/01/24 9178 06/02/24 9670  NA 133* 133* 135 134* 136 134* 137  K 4.7 3.9 4.1 4.1 4.2 4.0 4.1  CL 97* 95* 97* 95* 98 95* 98  CO2 17* 22 20* 24 22 24  21*  GLUCOSE 166* 90 125* 140* 134* 155* 114*  BUN 124* 64* 91* 49* 65* 30* 39*  CREATININE 9.30* 5.92* 8.02* 5.82* 8.09* 5.65* 7.27*  CALCIUM  8.2* 7.9* 7.9* 7.7* 8.1* 7.9* 8.2*  PHOS 6.6* 6.1* 8.5* 6.8* 9.7* 6.5* 8.8*   CBC Recent Labs  Lab 05/30/24 0527 05/31/24 0538 06/01/24 0821 06/02/24 0329  WBC 8.0 9.2 6.8 6.6  NEUTROABS 6.2 7.3 4.9 4.5  HGB 6.8* 7.4*  6.8* 7.0*  HCT 20.5* 22.3* 20.7* 21.3*  MCV 73.7* 73.4* 75.0* 74.7*  PLT 333 422* 336 382    Medications:     (feeding supplement) PROSource Plus  30 mL Oral BID BM   Chlorhexidine Gluconate Cloth  6 each Topical Q0600   clopidogrel   75 mg Oral Daily   darbepoetin (ARANESP) injection - DIALYSIS  150 mcg Subcutaneous Q Sun-1800   ezetimibe   10 mg Oral Daily   feeding supplement  237 mL Oral BID BM   Gerhardt's butt cream   Topical Daily   heparin  injection (subcutaneous)  5,000 Units Subcutaneous Q8H   insulin  aspart  0-15 Units Subcutaneous TID WC   insulin  aspart  0-5 Units Subcutaneous QHS   insulin  aspart  8 Units Subcutaneous TID WC   insulin  glargine-yfgn  22 Units Subcutaneous Daily   lidocaine -EPINEPHrine  20 mL Intradermal Once   multivitamin  1 tablet Oral QHS   sevelamer carbonate  800 mg Oral TID WC

## 2024-06-02 NOTE — Progress Notes (Signed)
 Inpatient Rehab Admissions Coordinator:    CIR following. Pt. Progressing well but states needs to be mod I at home. I will send case to insurance once OT note is in today.   Leita Kleine, MS, CCC-SLP Rehab Admissions Coordinator  [phone] (celll) [phone] (office)

## 2024-06-02 NOTE — Progress Notes (Signed)
 Contacted by case manager this morning inquiring about out-pt clinic placement at d/c. At this time, nephrologist has not requested that pt be clipped for HD needs at d/c. Navigator contacted nephrologist to discuss pt's case. Will await direction from nephrologist and assist accordingly. If HD to be needed at d/c, pt's referral will need to be submitted at an appropriate time to avoid a chair being held for pt too long and then referral being cancelled.   Randine Mungo Dialysis Navigator  [phone]

## 2024-06-02 NOTE — Progress Notes (Signed)
 Occupational Therapy Treatment Patient Details Name: [facility] Flores MRN: [id number] DOB: Dec 21, 1965 Today's Date: 06/02/2024   History of present illness 58 yo presenting to [facility] on 10/12 due to fever, rhinorrhea, congestion, SOB progressing over the past 3 days. Also with nausea and vomiting for 2 days. Pt found to be in acute respiratory failure with hypoxia in setting of bil multifocal pneumonia and severe sepsis with septic shock; complicated by AKI due to septic NSTEMI and DKA. Pt on CRRT off starting 10/16. TDC placed on 10/27 AM. PMH: DM II, hyperlipidemia, CAD, Jehovah's Witness.   OT comments  Pt making good progress towards goals. Pt requiring less physical assistance for transfers and simulated LB ADLs today- Supervision without AD. Provided level 3 theraband w/ pt able to easily demonstrate 4/4 UE exercises; familiar with them from her job. Encouraged daily exercises and gradual increase in activity tolerance. Pt remains hopeful for intensive rehab placement due to concerns regarding being able to manage current medical complexities.  HR 120s with seated activities, 130s with standing activities.       If plan is discharge home, recommend the following:  A little help with walking and/or transfers;A little help with bathing/dressing/bathroom;Assistance with cooking/housework   Equipment Recommendations  BSC/3in1;Other (comment) (RW vs Rollator)    Recommendations for Other Services Rehab consult    Precautions / Restrictions Precautions Precautions: Fall Recall of Precautions/Restrictions: Intact Precaution/Restrictions Comments: tachy, has had some bleeding on bed pad Restrictions Weight Bearing Restrictions Per Provider Order: No       Mobility Bed Mobility               General bed mobility comments: in chair on entry    Transfers Overall transfer level: Needs assistance Equipment used: None Transfers: Sit to/from Stand Sit to Stand: Supervision            General transfer comment: able to stand from recliner without AD     Balance Overall balance assessment: Needs assistance Sitting-balance support: No upper extremity supported, Feet unsupported Sitting balance-Leahy Scale: Good     Standing balance support: No upper extremity supported, During functional activity, Bilateral upper extremity supported Standing balance-Leahy Scale: Fair                             ADL either performed or assessed with clinical judgement   ADL Overall ADL's : Needs assistance/impaired                     Lower Body Dressing: Sitting/lateral leans;Sit to/from stand;Supervision/safety Lower Body Dressing Details (indicate cue type and reason): simulated reaching behind without UE support, reaching to feet. no LOB or AD needed               General ADL Comments: Pt reports recently transferring to [facility] with nursing staff, declined formal ADL retraining. Emphasis on UE HEP education with theraband, education on progressive activity tolerance and standing balance assessments. Pt able to reach above head without issues and behind self    Extremity/Trunk Assessment Upper Extremity Assessment Upper Extremity Assessment: Overall WFL for tasks assessed;Right hand dominant   Lower Extremity Assessment Lower Extremity Assessment: Defer to PT evaluation        Vision   Vision Assessment?: No apparent visual deficits   Perception     Praxis     Communication Communication Communication: No apparent difficulties   Cognition Arousal: Alert Behavior During Therapy: Flat affect Cognition:  No apparent impairments                               Following commands: Intact        Cueing   Cueing Techniques: Verbal cues  Exercises Exercises: General Upper Extremity General Exercises - Upper Extremity Shoulder Flexion: Strengthening, Both, 10 reps, Seated, Theraband Theraband Level (Shoulder Flexion): Level 3  (Green) Shoulder Horizontal ABduction: Strengthening, Both, 10 reps, Theraband, Seated Theraband Level (Shoulder Horizontal Abduction): Level 3 (Green) Elbow Flexion: Strengthening, Both, 10 reps, Seated, Theraband Theraband Level (Elbow Flexion): Level 3 (Green) Elbow Extension: Strengthening, Both, 10 reps, Seated, Theraband Theraband Level (Elbow Extension): Level 3 (Green)    Shoulder Instructions       General Comments HR 130s with standing tasks    Pertinent Vitals/ Pain       Pain Assessment Pain Assessment: Faces Faces Pain Scale: Hurts a little bit Pain Location: generalized Pain Descriptors / Indicators: Grimacing Pain Intervention(s): Monitored during session, Limited activity within patient's tolerance  Home Living                                          Prior Functioning/Environment              Frequency  Min 2X/week        Progress Toward Goals  OT Goals(current goals can now be found in the care plan section)  Progress towards OT goals: Progressing toward goals  Acute Rehab OT Goals Patient Stated Goal: feel more secure with abilities and medical complexities OT Goal Formulation: With patient/family Time For Goal Achievement: 06/05/24 Potential to Achieve Goals: Good ADL Goals Pt Will Perform Lower Body Bathing: with min assist;sitting/lateral leans;sit to/from stand Pt Will Transfer to Toilet: with contact guard assist;ambulating Pt/caregiver will Perform Home Exercise Program: Increased strength;Both right and left upper extremity;With theraband;Independently;With written HEP provided Additional ADL Goal #1: Pt to verbalize at least 3 energy conservation strategies to implement during daily routine  Plan      Co-evaluation                 AM-PAC OT 6 Clicks Daily Activity     Outcome Measure   Help from another person eating meals?: A Little Help from another person taking care of personal grooming?: A  Little Help from another person toileting, which includes using toliet, bedpan, or urinal?: A Little Help from another person bathing (including washing, rinsing, drying)?: A Lot Help from another person to put on and taking off regular upper body clothing?: A Little Help from another person to put on and taking off regular lower body clothing?: A Lot 6 Click Score: 16    End of Session    OT Visit Diagnosis: Muscle weakness (generalized) (M62.81)   Activity Tolerance Patient tolerated treatment well   Patient Left in chair;with call bell/phone within reach   Nurse Communication Mobility status        Time: 8858-8841 OT Time Calculation (min): 17 min  Charges: OT General Charges $OT Visit: 1 Visit OT Treatments $Therapeutic Activity: 8-22 mins  Mliss NOVAK, OTR/L Acute Rehab Services Office: [phone]   Mliss Fish 06/02/2024, 12:36 PM

## 2024-06-03 DIAGNOSIS — N179 Acute kidney failure, unspecified: Secondary | ICD-10-CM | POA: Diagnosis not present

## 2024-06-03 LAB — CBC WITH DIFFERENTIAL/PLATELET
Abs Immature Granulocytes: 0.03 K/uL (ref 0.00–0.07)
Basophils Absolute: 0 K/uL (ref 0.0–0.1)
Basophils Relative: 1 %
Eosinophils Absolute: 0 K/uL (ref 0.0–0.5)
Eosinophils Relative: 0 %
HCT: 22.3 % — ABNORMAL LOW (ref 36.0–46.0)
Hemoglobin: 7.4 g/dL — ABNORMAL LOW (ref 12.0–15.0)
Immature Granulocytes: 0 %
Lymphocytes Relative: 13 %
Lymphs Abs: 1 K/uL (ref 0.7–4.0)
MCH: 24.9 pg — ABNORMAL LOW (ref 26.0–34.0)
MCHC: 33.2 g/dL (ref 30.0–36.0)
MCV: 75.1 fL — ABNORMAL LOW (ref 80.0–100.0)
Monocytes Absolute: 0.4 K/uL (ref 0.1–1.0)
Monocytes Relative: 5 %
Neutro Abs: 6.3 K/uL (ref 1.7–7.7)
Neutrophils Relative %: 81 %
Platelets: 379 K/uL (ref 150–400)
RBC: 2.97 MIL/uL — ABNORMAL LOW (ref 3.87–5.11)
RDW: 19 % — ABNORMAL HIGH (ref 11.5–15.5)
WBC: 7.8 K/uL (ref 4.0–10.5)
nRBC: 0 % (ref 0.0–0.2)

## 2024-06-03 LAB — RENAL FUNCTION PANEL
Albumin: 2.6 g/dL — ABNORMAL LOW (ref 3.5–5.0)
Anion gap: 18 — ABNORMAL HIGH (ref 5–15)
BUN: 49 mg/dL — ABNORMAL HIGH (ref 6–20)
CO2: 20 mmol/L — ABNORMAL LOW (ref 22–32)
Calcium: 8.3 mg/dL — ABNORMAL LOW (ref 8.9–10.3)
Chloride: 100 mmol/L (ref 98–111)
Creatinine, Ser: 9.24 mg/dL — ABNORMAL HIGH (ref 0.44–1.00)
GFR, Estimated: 4 mL/min — ABNORMAL LOW (ref >60)
Glucose, Bld: 216 mg/dL — ABNORMAL HIGH (ref 70–99)
Phosphorus: 10.1 mg/dL — ABNORMAL HIGH (ref 2.5–4.6)
Potassium: 4.1 mmol/L (ref 3.5–5.1)
Sodium: 138 mmol/L (ref 135–145)

## 2024-06-03 LAB — GLUCOSE, CAPILLARY
Glucose-Capillary: 114 mg/dL — ABNORMAL HIGH (ref 70–99)
Glucose-Capillary: 138 mg/dL — ABNORMAL HIGH (ref 70–99)
Glucose-Capillary: 127 mg/dL — ABNORMAL HIGH (ref 70–99)
Glucose-Capillary: 96 mg/dL (ref 70–99)

## 2024-06-03 LAB — MAGNESIUM: Magnesium: 2.1 mg/dL (ref 1.7–2.4)

## 2024-06-03 MED ORDER — NEPRO/CARBSTEADY PO LIQD
237.0000 mL | ORAL | Status: DC
  Administered 2024-06-03: 237 mL via ORAL

## 2024-06-03 MED ORDER — SALINE SPRAY 0.65 % NA SOLN
1.0000 | NASAL | Status: DC | PRN
Start: 2024-06-03 — End: 2024-06-04
  Filled 2024-06-03: qty 44

## 2024-06-03 MED ORDER — FERRIC CITRATE 1 GM 210 MG(FE) PO TABS
420.0000 mg | ORAL_TABLET | Freq: Three times a day (TID) | ORAL | Status: DC
  Administered 2024-06-03 (×2): 420 mg via ORAL
  Filled 2024-06-03 (×5): qty 2

## 2024-06-03 NOTE — Plan of Care (Signed)

## 2024-06-03 NOTE — Progress Notes (Addendum)
 Nutrition Follow-up  DOCUMENTATION CODES:   Not applicable  INTERVENTION:  Continue liberalized diet to encourage PO intake   Monitor blood sugars and need for additional modifications to insulin  regimen - modifications ongoing   Modify to Nepro Shake po daily, each supplement provides 425 kcal and 19 grams protein    Decrease 30 ml ProSource Plus to once daily, each supplement provides 100 kcals and 15 grams protein.    Continue renal MVI  Consider PTH lab draw and introduction of calcimimetic therapy to mitigate hyperphosphatemia   NUTRITION DIAGNOSIS:  Increased nutrient needs related to acute illness as evidenced by estimated needs.  GOAL:  Patient will meet greater than or equal to 90% of their needs  MONITOR:  PO intake, Supplement acceptance, I & O's, Labs, Weight trends  REASON FOR ASSESSMENT:   Consult Assessment of nutrition requirement/status (CRRT)  ASSESSMENT:   58 yo female admitted with severe sepsis with pneumonia, acute respiratory failure, anuric AKI with acidosis, poorly controlled DM. PMH includes HTN, HLD, CAD s/p MI with stent x 1, DM2 on insulin .  10/12 Admitted NSTEMI, sepsis 10/13 BiPap, Febrile Tmax 103 10/14 CRRT initiated  10/16 CRRT discontinued 10/17 Remains anuric, Creatinine rising 10/18 Transitioned to HD 10/21 Renal ultrasound unrevealing 10/27 R internal jugular TDC placed   Renal improvement not appreciated. Tunneled HDC placed yesterday. Starting clipping process for outpatient HD in the setting of AKI.    Received consult from nephrology with request to assess supplement regimen in an attempt to lower phosphorus level. Of note, diet has been liberalized to maximize intake and prioritize nutrition status and preservation of lean body mass. Intake of processed meats (hot dogs) likely contributing. Additionally, increased protein intake via ONS also a likely culprit d/t phosphorus presenting as byproduct of protein metabolism. Noted  she has also been three days without HD tx.  Binder regimen modified to Auryxia to encourage improvement in anemia trends as well as increase phosphorus binding capacity. Would recommend self-regulating, if indicated, in lieu of restricting diet as this may negatively impact patient's nutrition status and ability to progress with therapy.   Discussed all of this with patient. Noted she is refusing Occupational psychologist. Discussed importance of protein intake and how she is not currently meeting her estimated protein needs. Alerted her to reduction of ONS offerings to once daily to promote acceptance.  Will continue to monitor trends. Decrease ONS offerings to once daily and modify to Nepro to reduce phosphorus intake. Could consider PTH draw and introduction of calcimimetic therapy, if indicated.  Bowels appear stable and likely not contributing to reabsorption of phosphorus in the gut. Alerted patient to mechanism of action of phosphorus binder and the need for regular bowel movements. She verbalized understanding.    Average Meal Intake 10/23: 100% x1 documented meal (hard cooked egg x2, banana, and coffee) 10/27: 50% x1 documented meal (hot dog on a but w/ banana and cranberry juice) 10/28: 80-100% x2 documented meals  Some mild edema continues, however not significant. Current wt 87.3 kg, highest wt 107.3 kg. Pt reports UBW 189 pounds (86 kg). She is slowly approaching her reported UBW. Likely some true body weight loss this admission d/t immobility and poor PO intake. Would expect estimated target weight to be a bit lower than reported UBW.    Last HD tx: 10/25 UF removed: 2.0L  Drains/Lines: R internal jugular: TDC (placed 10/27) UOP: x24 hours   Labs: Sodium 138 (wdl) BUN 49 (H) Creatinine 9.24 (H) Potassium 4.1 (  wdl) Phosphorus 10.1 (H) Corr Ca 9.4 (wdl) Magnesium 2.1 (wdl) CBGs 14-216 x24 hours A1c 9.9% (05/2024)   Meds: Darbepoetin Alfa 150mcg weekly Auryxia TID w/  meals Ss novolog  with meals and bedtime Novolog  8 units with meals Semglee  22 modified today from 25 Rena-vite   Diet Order:   Diet Order             Diet Carb Modified Fluid consistency: Thin; Room service appropriate? Yes  Diet effective now                  EDUCATION NEEDS:  Not appropriate for education at this time  Skin:  Skin Assessment: Reviewed RN Assessment  Last BM:  10/28 - type 6 x1  Height:  Ht Readings from Last 1 Encounters:  05/18/24 5' 5 (1.651 m)   Weight:  Wt Readings from Last 1 Encounters:  06/03/24 87.3 kg   Ideal Body Weight:  56.8 kg  BMI:  Body mass index is 32.03 kg/m.  Estimated Nutritional Needs:   Kcal:  1700-1900 kcals  Protein:  95-110g  Fluid:  1L plus UOP  Blair Deaner MS, RD, LDN Registered Dietitian Clinical Nutrition RD Inpatient Contact Info in Amion

## 2024-06-03 NOTE — Progress Notes (Addendum)
 Requested to see pt for out-pt HD needs at d/c. Attempted to meet with pt at bedside this morning but pt receiving pt care. Will f/u with pt later today as schedule allows.   Randine Mungo Dialysis Navigator [phone]  Addendum at 12:24 pm: Met with pt at bedside. Introduced self and explained role. Discussed out-pt HD options. Pt voices interest in [facility]. Referral submitted to [facility] admissions today for review. Pt states that pt's husband will assist with transportation to HD appts at d/c. Will assist as needed.

## 2024-06-03 NOTE — Progress Notes (Signed)
Inpatient Rehab Admissions Coordinator:    CIR following. Case pending with insurance.   Megan Salon, MS, CCC-SLP Rehab Admissions Coordinator  [phone] (celll) [phone] (office)

## 2024-06-03 NOTE — Progress Notes (Signed)
 [facility] Progress Note    Assessment/ Plan:    AKI - in setting of DKA & ischemic ATN in setting of volume depletion and acute illness as well as hypotension. Baseline Cr ~1. UA with gluc, blood, and protein.  Negative nitrite and renal US  without obstruction. S/p CRRT via temp line from 10/14-10/16/25. Transitioned to HD on 10/18  Appreciate VIR placing the right IJ [facility] 10/27 and removing the temp cath HD on TTS sched, can come up to [facility]. No signs of renal recovery at this junction however need STRICT I/O to look at her trajectory.  She actually had 800 cc of urine output over past 24 hours but renal functions continue to worsen.  Will plan on dialysis today without any ultrafiltration. She knows the hope is to liberalize her from dialysis but may take longer than expected.  Will continue to monitor for renal recovery.  I did give her a heads up that she may be leaving the hospital on dialysis and she is understanding of this.  Avoid nephrotoxic medications including NSAIDs and iodinated intravenous contrast exposure unless the latter is absolutely indicated.  Preferred narcotic agents for pain control are hydromorphone, fentanyl , and methadone. Morphine should not be used. Avoid Baclofen and avoid oral sodium phosphate and magnesium citrate based laxatives / bowel preps. Continue strict Input and Output monitoring. Will monitor the patient closely with you and intervene or adjust therapy as indicated by changes in clinical status/labs   Severe sepsis with septic shock due to RML pneumonia -s/p doxycycline.  Per primary service Hematuria: UA pending (collect when able), renal u/s 10/21 unrevealing, w/u per primary service Acute hypoxic respiratory failure - improved  Acute NSTEMI - on plavix  and being followed by Cardiology. Hyponatremia - due to DKA and AKI, will attempt to manage with HD Hypokalemia - due to DKA. resolved DKA - off insulin  drip per primary service Abnormal  LFT's - likely shock liver.  Per primary Thrombocytopenia - due to acute critical illness--resolved AGMA - due to #1 and #7.  Metformin  stopped.  Improved with renal replacement therapy  Anemia - likely due to critical illness and AKI.  She will not accept blood transfusions (J[employer]ovah's Witness).  ESA dose increased for 10/25.  Receiving IV iron since she is off antibiotics.  Hemoglobin up to 7.4  CKDMBD, hyperphos: po4 9.7, stop the  renvela and trying Auryxia 2 tabs TIDM, also change the supplementation to Nepro. Will d/w RD Severe protein malnutrition - alb low.  protein supplements per RD.  Pending CIR but likely to be denied; starting CLIP process for AKI.   Subjective:   Patient seen and examined bedside.  She reports that she continues to urinate but objectively inadequate.   Otherwise no complaints; denies fever, chills, nausea, shortness of breath.  She is very tired after the procedure to place the tunneled catheter.Danielle Flores Has had more UOP, able to get to restroom.    Objective:   BP (!) 167/88 (BP Location: Left Arm)   Pulse (!) 102   Temp 98.8 F (37.1 C) (Oral)   Resp 20   Ht 5' 5 (1.651 m)   Wt 87.3 kg   LMP  (LMP Unknown)   SpO2 94%   BMI 32.03 kg/m   Intake/Output Summary (Last 24 hours) at 06/03/2024 0957 Last data filed at 06/03/2024 0541 Gross per 24 hour  Intake 540 ml  Output 800 ml  Net -260 ml    Weight change: -0.683 kg  Physical  Exam: Gen: NAD, laying flat in bed CVS: RRR Resp: CTA B/L, normal wob on RA Jai:dnqu, nt/nd Ext: no edema Neuro: awake, alert Dialysis access: RIJ tunneled HD catheter c/d/i  Imaging: IR TUNNELED CENTRAL VENOUS CATH [facility] W IMG Result Date: 06/02/2024 INDICATION: Acute kidney injury. Patient has a temporary dialysis catheter and needs a tunneled dialysis catheter. Temporary dialysis catheter was removed prior to this procedure. EXAM: FLUOROSCOPIC AND ULTRASOUND GUIDED PLACEMENT OF A TUNNELED DIALYSIS CATHETER Physician:  Juliene SAUNDERS. Philip, MD MEDICATIONS: Ancef 2 g; The antibiotic was administered within an appropriate time interval prior to skin puncture. ANESTHESIA/SEDATION: Moderate (conscious) sedation was employed during this procedure. A total of Versed  1.5 mg and fentanyl  50 mcg was administered intravenously at the order of the provider performing the procedure. Total intra-service moderate sedation time: 21 minutes. Patient's level of consciousness and vital signs were monitored continuously by radiology nurse throughout the procedure under the supervision of the provider performing the procedure. FLUOROSCOPY TIME:  Radiation Exposure Index (as provided by the fluoroscopic device): 5 mGy Kerma COMPLICATIONS: None immediate. PROCEDURE: Informed consent was obtained for placement of a tunneled dialysis catheter. The patient was placed supine on the interventional table. Ultrasound confirmed a patent right internal jugular vein. Ultrasound image obtained for documentation. The right neck and chest was prepped and draped in a sterile fashion. Maximal barrier sterile technique was utilized including caps, mask, sterile gowns, sterile gloves, sterile drape, hand hygiene and skin antiseptic. The right neck was anesthetized with 1% lidocaine . A small incision was made with #11 blade scalpel. A 21 gauge needle directed into the right internal jugular vein with ultrasound guidance. A micropuncture dilator set was placed. A 19 cm tip to cuff Palindrome catheter was selected. The skin below the right clavicle was anesthetized and a small incision was made with an #11 blade scalpel. A subcutaneous tunnel was formed to the vein dermatotomy site. The catheter was brought through the tunnel. The vein dermatotomy site was dilated to accommodate a peel-away sheath over a wire. The catheter was placed through the peel-away sheath and directed into the central venous structures. The tip of the catheter was placed at superior cavoatrial junction  with fluoroscopy. Fluoroscopic images were obtained for documentation. Both lumens were found to aspirate and flush well. The proper amount of heparin  was flushed in both lumens. The vein dermatotomy site was closed using a single layer of absorbable suture and Dermabond. Gel-Foam placed in the subcutaneous tract. The catheter was secured to the skin using Prolene suture. IMPRESSION: Successful placement of a right jugular tunneled dialysis catheter using ultrasound and fluoroscopic guidance. Electronically Signed   By: Juliene Philip M.D.   On: 06/02/2024 10:49     Labs: BMET Recent Labs  Lab 05/28/24 0246 05/29/24 0234 05/30/24 9472 05/31/24 0538 06/01/24 0821 06/02/24 0329 06/03/24 0845  NA 133* 135 134* 136 134* 137 138  K 3.9 4.1 4.1 4.2 4.0 4.1 4.1  CL 95* 97* 95* 98 95* 98 100  CO2 22 20* 24 22 24  21* 20*  GLUCOSE 90 125* 140* 134* 155* 114* 216*  BUN 64* 91* 49* 65* 30* 39* 49*  CREATININE 5.92* 8.02* 5.82* 8.09* 5.65* 7.27* 9.24*  CALCIUM  7.9* 7.9* 7.7* 8.1* 7.9* 8.2* 8.3*  PHOS 6.1* 8.5* 6.8* 9.7* 6.5* 8.8* 10.1*   CBC Recent Labs  Lab 05/31/24 0538 06/01/24 0821 06/02/24 0329 06/03/24 0845  WBC 9.2 6.8 6.6 7.8  NEUTROABS 7.3 4.9 4.5 6.3  HGB 7.4* 6.8* 7.0* 7.4*  HCT 22.3* 20.7* 21.3* 22.3*  MCV 73.4* 75.0* 74.7* 75.1*  PLT 422* 336 382 379    Medications:     (feeding supplement) PROSource Plus  30 mL Oral BID BM   Chlorhexidine Gluconate Cloth  6 each Topical Q0600   clopidogrel   75 mg Oral Daily   darbepoetin (ARANESP) injection - DIALYSIS  150 mcg Subcutaneous Q Sun-1800   ezetimibe   10 mg Oral Daily   feeding supplement  237 mL Oral BID BM   Gerhardt's butt cream   Topical Daily   heparin  injection (subcutaneous)  5,000 Units Subcutaneous Q8H   insulin  aspart  0-15 Units Subcutaneous TID WC   insulin  aspart  0-5 Units Subcutaneous QHS   insulin  aspart  8 Units Subcutaneous TID WC   insulin  glargine-yfgn  22 Units Subcutaneous Daily    lidocaine -EPINEPHrine  20 mL Intradermal Once   multivitamin  1 tablet Oral QHS   sevelamer carbonate  800 mg Oral TID WC

## 2024-06-03 NOTE — Progress Notes (Signed)
 Progress Note    Danielle Flores   FMW:[id number]  DOB: July 10, 1966  DOA: 05/18/2024     16 PCP: Danielle Cyndee Jamee JONELLE, DO  Initial CC: flu like symptoms  Hospital Course: Danielle Flores is a 58 year old **J[employer]ah[employer] Witness** female with past medical history of CAD status post MI (09/2020) s/p 1 coronary stent, T2DM, HTN, HLD who presented to [facility] ED on 05/18/2024 with 3-day history of rhinorrhea, fever, congestion, cough, shortness of breath, generalized weakness, and 1 day history of N/V.  In the ED, the patient was febrile to a Tmax of 102.6 F, tachycardic to 132, tachypneic at 28, BP 121/73, SpO2 89% on RA that improved to 95% on 2 L nasal cannula.   However patient continued to have increased work of breathing as well as in mild distress and was placed on BiPAP.  CBC showed leukocytosis of 14.3.  CMP remarkable for sodium 126, calcium  2.9, bicarb 12, blood glucose 524, creatinine 2.96 (baseline 0.8-1), AST 233, ALT 62, anion gap 24. BhB 1.30. BNP wnl. Initial high sensitive troponin was elevated to 1322 and ultimately peaked at 2036. COVID/flu/RSV negative.  PCCM was consulted and patient was admitted to the ICU for management of DKA, elevated troponins, AKI, and shortness of breath.   Assessment & Plan:   Acute kidney injury due to septic ATN - Patient was initially on CRRT - Dialysis schedule per nephrology - [facility] placed 10/27 - Labs still suggest dialysis dependent.  Continue measuring urine output; output did pick up over the past 24 hours, noted an 800 cc (up from prior 200 cc) -Continue documenting accurate urine output - CLIP process initiated as patient might not go to CIR given improvement  Microcytic anemia Anemia of chronic disease  - Likely combo of renal failure and iron deficiency -On Aranesp weekly, Sundays - venofer given 10/23 - Still combo of renal failure and HD filter; patient Jehovoah Witness and still declines PRBC. BP is stable  - Hemoglobin low but  stable  Physical deconditioning - PT/OT following - Tentative plan was for CIR however she has slowly improved during hospitalization while awaiting medical stability and CIR workup - CIR still following.  Case is pending insurance approval.  If excepted okay for CIR but if declined, plan will likely be discharging home with home health - Remains medically stable for discharge at this time  Severe sepsis due to PNA -resolved -Sepsis physiology has resolved. - Persistent leukocytosis.  WBC downtrending; will keep watching - Completed doxycycline and Zosyn courses - Stool studies negative.  Okay for Imodium   Acute respiratory failure with hypoxia in the setting of bilateral multifocal pneumonia - resolved - Remains on room air - Patient has completed course of Zosyn and doxy  - Patient was initially on CRRT (stopped on 05/22/2024).   DMII DKA - resolved  - Continue Semglee  and NovoLog  - A1c of 9.9%.   Hypervolemic hyponatremia - Likely volume related. - Continue to optimize volume with hemodialysis/ultrafiltration   Acute metabolic acidosis - Related to acute kidney injury   Demand cardiac ischemia in the setting of septic shock Shock liver, improving Anemia and thrombocytopenia critical illness  Interval History:  Still somewhat depressed mood from needing ongoing dialysis and catheter however starting to process better today. Husband present bedside this morning. Labs still showing no renal improvement between dialysis sessions.  Nephrology planning on repeat dialysis session today. Clip process started. Still awaiting insurance decision regarding CIR.  Antimicrobials:   DVT prophylaxis:  heparin   injection 5,000 Units Start: 05/20/24 1500 SCDs Start: 05/18/24 0404   Code Status:   Code Status: Full Code  Mobility Assessment (Last 72 Hours)     Mobility Assessment     Row Name 06/02/24 1935 06/02/24 1218 06/02/24 1100 06/02/24 0930 06/01/24 1945   Does the  patient have exclusion criteria? No - Perform mobility assessment -- -- No - Perform mobility assessment No - Perform mobility assessment   What is the highest level of mobility based on the mobility assessment? Level 4 (Ambulates with assistance) - Balance while stepping forward/back - Complete Level 4 (Ambulates with assistance) - Balance while stepping forward/back - Complete Level 4 (Ambulates with assistance) - Balance while stepping forward/back - Complete Level 4 (Ambulates with assistance) - Balance while stepping forward/back - Complete Level 4 (Ambulates with assistance) - Balance while stepping forward/back - Complete   Is the above level different from baseline mobility prior to current illness? Yes - Recommend PT order -- -- -- Yes - Recommend PT order    Row Name 06/01/24 0715 05/31/24 2005         Does the patient have exclusion criteria? No - Perform mobility assessment No - Perform mobility assessment      What is the highest level of mobility based on the mobility assessment? Level 4 (Ambulates with assistance) - Balance while stepping forward/back - Complete Level 4 (Ambulates with assistance) - Balance while stepping forward/back - Complete      Is the above level different from baseline mobility prior to current illness? Yes - Recommend PT order Yes - Recommend PT order         Barriers to discharge: None Disposition Plan: ?CIR vs home with HH if denied HH orders placed: TBD Status is: Inpatient  Objective: Blood pressure (!) 148/69, pulse (!) 101, temperature 98 F (36.7 C), temperature source Oral, resp. rate 18, height 5' 5 (1.651 m), weight 87.3 kg, SpO2 98%.  Examination:  Physical Exam Constitutional:      Appearance: Normal appearance.  HENT:     Head: Normocephalic and atraumatic.     Mouth/Throat:     Mouth: Mucous membranes are moist.  Eyes:     Extraocular Movements: Extraocular movements intact.  Neck:     Comments: Right tunneled IJ in place with no  surrounding swelling, erythema, bruising Cardiovascular:     Rate and Rhythm: Normal rate and regular rhythm.  Pulmonary:     Effort: Pulmonary effort is normal. No respiratory distress.     Breath sounds: Normal breath sounds. No wheezing.  Abdominal:     General: Bowel sounds are normal. There is no distension.     Palpations: Abdomen is soft.     Tenderness: There is no abdominal tenderness.  Musculoskeletal:        General: Normal range of motion.     Cervical back: Normal range of motion and neck supple.  Skin:    General: Skin is warm and dry.  Neurological:     General: No focal deficit present.     Mental Status: She is alert.  Psychiatric:        Mood and Affect: Mood normal.      Consultants:  Nephrology  Procedures:  06/02/2024: Tunneled right IJ dialysis catheter  Data Reviewed: Results for orders placed or performed during the hospital encounter of 05/18/24 (from the past 24 hours)  Glucose, capillary     Status: Abnormal   Collection Time: 06/02/24  4:06 PM  Result Value Ref Range   Glucose-Capillary 125 (H) 70 - 99 mg/dL  Glucose, capillary     Status: None   Collection Time: 06/02/24  9:23 PM  Result Value Ref Range   Glucose-Capillary 72 70 - 99 mg/dL  Glucose, capillary     Status: Abnormal   Collection Time: 06/03/24  6:30 AM  Result Value Ref Range   Glucose-Capillary 114 (H) 70 - 99 mg/dL  Renal function panel (daily at 0500)     Status: Abnormal   Collection Time: 06/03/24  8:45 AM  Result Value Ref Range   Sodium 138 135 - 145 mmol/L   Potassium 4.1 3.5 - 5.1 mmol/L   Chloride 100 98 - 111 mmol/L   CO2 20 (L) 22 - 32 mmol/L   Glucose, Bld 216 (H) 70 - 99 mg/dL   BUN 49 (H) 6 - 20 mg/dL   Creatinine, Ser 0.75 (H) 0.44 - 1.00 mg/dL   Calcium  8.3 (L) 8.9 - 10.3 mg/dL   Phosphorus 89.8 (H) 2.5 - 4.6 mg/dL   Albumin 2.6 (L) 3.5 - 5.0 g/dL   GFR, Estimated 4 (L) >60 mL/min   Anion gap 18 (H) 5 - 15  Magnesium     Status: None   Collection  Time: 06/03/24  8:45 AM  Result Value Ref Range   Magnesium 2.1 1.7 - 2.4 mg/dL  CBC with Differential/Platelet     Status: Abnormal   Collection Time: 06/03/24  8:45 AM  Result Value Ref Range   WBC 7.8 4.0 - 10.5 K/uL   RBC 2.97 (L) 3.87 - 5.11 MIL/uL   Hemoglobin 7.4 (L) 12.0 - 15.0 g/dL   HCT 77.6 (L) 63.9 - 53.9 %   MCV 75.1 (L) 80.0 - 100.0 fL   MCH 24.9 (L) 26.0 - 34.0 pg   MCHC 33.2 30.0 - 36.0 g/dL   RDW 80.9 (H) 88.4 - 84.4 %   Platelets 379 150 - 400 K/uL   nRBC 0.0 0.0 - 0.2 %   Neutrophils Relative % 81 %   Neutro Abs 6.3 1.7 - 7.7 K/uL   Lymphocytes Relative 13 %   Lymphs Abs 1.0 0.7 - 4.0 K/uL   Monocytes Relative 5 %   Monocytes Absolute 0.4 0.1 - 1.0 K/uL   Eosinophils Relative 0 %   Eosinophils Absolute 0.0 0.0 - 0.5 K/uL   Basophils Relative 1 %   Basophils Absolute 0.0 0.0 - 0.1 K/uL   Immature Granulocytes 0 %   Abs Immature Granulocytes 0.03 0.00 - 0.07 K/uL  Glucose, capillary     Status: Abnormal   Collection Time: 06/03/24 10:54 AM  Result Value Ref Range   Glucose-Capillary 138 (H) 70 - 99 mg/dL    I have reviewed pertinent nursing notes, vitals, labs, and images as necessary. I have ordered labwork to follow up on as indicated.  I have reviewed the last notes from staff over past 24 hours. I have discussed patient's care plan and test results with nursing staff, CM/SW, and other staff as appropriate.  Old records reviewed in assessment of this patient  Time spent: Greater than 50% of the 55 minute visit was spent in counseling/coordination of care for the patient as laid out in the A&P.   LOS: 16 days   Alm Apo, MD [facility] 06/03/2024, 11:50 AM

## 2024-06-04 ENCOUNTER — Other Ambulatory Visit: Payer: Self-pay

## 2024-06-04 ENCOUNTER — Encounter (HOSPITAL_COMMUNITY): Payer: Self-pay | Admitting: Physical Medicine & Rehabilitation

## 2024-06-04 ENCOUNTER — Inpatient Hospital Stay (HOSPITAL_COMMUNITY)
Admission: AD | Admit: 2024-06-04 | Discharge: 2024-06-11 | DRG: 945 | Disposition: A | Discharge status: Home / Self Care | Source: Intra-hospital | Attending: Physical Medicine & Rehabilitation | Admitting: Physical Medicine & Rehabilitation

## 2024-06-04 DIAGNOSIS — R5381 Other malaise: Secondary | ICD-10-CM | POA: Diagnosis not present

## 2024-06-04 DIAGNOSIS — I2489 Other forms of acute ischemic heart disease: Secondary | ICD-10-CM | POA: Diagnosis not present

## 2024-06-04 DIAGNOSIS — Z4901 Encounter for fitting and adjustment of extracorporeal dialysis catheter: Secondary | ICD-10-CM | POA: Diagnosis not present

## 2024-06-04 DIAGNOSIS — E871 Hypo-osmolality and hyponatremia: Secondary | ICD-10-CM | POA: Diagnosis not present

## 2024-06-04 DIAGNOSIS — Z23 Encounter for immunization: Secondary | ICD-10-CM

## 2024-06-04 DIAGNOSIS — K5900 Constipation, unspecified: Secondary | ICD-10-CM | POA: Diagnosis not present

## 2024-06-04 DIAGNOSIS — E876 Hypokalemia: Secondary | ICD-10-CM | POA: Diagnosis not present

## 2024-06-04 DIAGNOSIS — D62 Acute posthemorrhagic anemia: Secondary | ICD-10-CM | POA: Diagnosis not present

## 2024-06-04 DIAGNOSIS — E785 Hyperlipidemia, unspecified: Secondary | ICD-10-CM | POA: Diagnosis present

## 2024-06-04 DIAGNOSIS — D259 Leiomyoma of uterus, unspecified: Secondary | ICD-10-CM | POA: Diagnosis not present

## 2024-06-04 DIAGNOSIS — I10 Essential (primary) hypertension: Secondary | ICD-10-CM | POA: Diagnosis not present

## 2024-06-04 DIAGNOSIS — E440 Moderate protein-calorie malnutrition: Secondary | ICD-10-CM | POA: Diagnosis not present

## 2024-06-04 DIAGNOSIS — N170 Acute kidney failure with tubular necrosis: Secondary | ICD-10-CM | POA: Diagnosis not present

## 2024-06-04 DIAGNOSIS — Z79899 Other long term (current) drug therapy: Secondary | ICD-10-CM | POA: Diagnosis not present

## 2024-06-04 DIAGNOSIS — R109 Unspecified abdominal pain: Secondary | ICD-10-CM | POA: Diagnosis not present

## 2024-06-04 DIAGNOSIS — Z8249 Family history of ischemic heart disease and other diseases of the circulatory system: Secondary | ICD-10-CM

## 2024-06-04 DIAGNOSIS — Z992 Dependence on renal dialysis: Secondary | ICD-10-CM | POA: Diagnosis not present

## 2024-06-04 DIAGNOSIS — K7200 Acute and subacute hepatic failure without coma: Secondary | ICD-10-CM | POA: Diagnosis not present

## 2024-06-04 DIAGNOSIS — R739 Hyperglycemia, unspecified: Secondary | ICD-10-CM | POA: Diagnosis not present

## 2024-06-04 DIAGNOSIS — A419 Sepsis, unspecified organism: Secondary | ICD-10-CM | POA: Diagnosis not present

## 2024-06-04 DIAGNOSIS — D638 Anemia in other chronic diseases classified elsewhere: Secondary | ICD-10-CM | POA: Diagnosis not present

## 2024-06-04 DIAGNOSIS — D696 Thrombocytopenia, unspecified: Secondary | ICD-10-CM | POA: Diagnosis not present

## 2024-06-04 DIAGNOSIS — Z955 Presence of coronary angioplasty implant and graft: Secondary | ICD-10-CM

## 2024-06-04 DIAGNOSIS — J9601 Acute respiratory failure with hypoxia: Secondary | ICD-10-CM | POA: Diagnosis not present

## 2024-06-04 DIAGNOSIS — E1110 Type 2 diabetes mellitus with ketoacidosis without coma: Secondary | ICD-10-CM | POA: Diagnosis not present

## 2024-06-04 DIAGNOSIS — Z6831 Body mass index (BMI) 31.0-31.9, adult: Secondary | ICD-10-CM | POA: Diagnosis not present

## 2024-06-04 DIAGNOSIS — I2510 Atherosclerotic heart disease of native coronary artery without angina pectoris: Secondary | ICD-10-CM | POA: Diagnosis present

## 2024-06-04 DIAGNOSIS — Z833 Family history of diabetes mellitus: Secondary | ICD-10-CM

## 2024-06-04 DIAGNOSIS — N179 Acute kidney failure, unspecified: Secondary | ICD-10-CM | POA: Diagnosis not present

## 2024-06-04 LAB — RENAL FUNCTION PANEL
Albumin: 2.5 g/dL — ABNORMAL LOW (ref 3.5–5.0)
Anion gap: 19 — ABNORMAL HIGH (ref 5–15)
BUN: 56 mg/dL — ABNORMAL HIGH (ref 6–20)
CO2: 18 mmol/L — ABNORMAL LOW (ref 22–32)
Calcium: 8.7 mg/dL — ABNORMAL LOW (ref 8.9–10.3)
Chloride: 100 mmol/L (ref 98–111)
Creatinine, Ser: 9.75 mg/dL — ABNORMAL HIGH (ref 0.44–1.00)
GFR, Estimated: 4 mL/min — ABNORMAL LOW (ref >60)
Glucose, Bld: 93 mg/dL (ref 70–99)
Phosphorus: 10.1 mg/dL — ABNORMAL HIGH (ref 2.5–4.6)
Potassium: 4.2 mmol/L (ref 3.5–5.1)
Sodium: 137 mmol/L (ref 135–145)

## 2024-06-04 LAB — CBC WITH DIFFERENTIAL/PLATELET
Abs Immature Granulocytes: 0.02 K/uL (ref 0.00–0.07)
Basophils Absolute: 0 K/uL (ref 0.0–0.1)
Basophils Relative: 1 %
Eosinophils Absolute: 0.3 K/uL (ref 0.0–0.5)
Eosinophils Relative: 4 %
HCT: 21.9 % — ABNORMAL LOW (ref 36.0–46.0)
Hemoglobin: 7.2 g/dL — ABNORMAL LOW (ref 12.0–15.0)
Immature Granulocytes: 0 %
Lymphocytes Relative: 16 %
Lymphs Abs: 1.2 K/uL (ref 0.7–4.0)
MCH: 25.2 pg — ABNORMAL LOW (ref 26.0–34.0)
MCHC: 32.9 g/dL (ref 30.0–36.0)
MCV: 76.6 fL — ABNORMAL LOW (ref 80.0–100.0)
Monocytes Absolute: 0.5 K/uL (ref 0.1–1.0)
Monocytes Relative: 6 %
Neutro Abs: 5.5 K/uL (ref 1.7–7.7)
Neutrophils Relative %: 73 %
Platelets: 352 K/uL (ref 150–400)
RBC: 2.86 MIL/uL — ABNORMAL LOW (ref 3.87–5.11)
RDW: 19.4 % — ABNORMAL HIGH (ref 11.5–15.5)
WBC: 7.4 K/uL (ref 4.0–10.5)
nRBC: 0.3 % — ABNORMAL HIGH (ref 0.0–0.2)

## 2024-06-04 LAB — GLUCOSE, CAPILLARY
Glucose-Capillary: 110 mg/dL — ABNORMAL HIGH (ref 70–99)
Glucose-Capillary: 123 mg/dL — ABNORMAL HIGH (ref 70–99)
Glucose-Capillary: 208 mg/dL — ABNORMAL HIGH (ref 70–99)
Glucose-Capillary: 113 mg/dL — ABNORMAL HIGH (ref 70–99)

## 2024-06-04 LAB — MAGNESIUM: Magnesium: 2.2 mg/dL (ref 1.7–2.4)

## 2024-06-04 MED ORDER — INSULIN GLARGINE-YFGN 100 UNIT/ML ~~LOC~~ SOLN: 25.0000 [IU] | Freq: Every day | SUBCUTANEOUS | Status: DC

## 2024-06-04 MED ORDER — LOPERAMIDE HCL 2 MG PO CAPS: 2.0000 mg | ORAL_CAPSULE | ORAL | Status: DC | PRN

## 2024-06-04 MED ORDER — EZETIMIBE 10 MG PO TABS
10.0000 mg | ORAL_TABLET | Freq: Every day | ORAL | Status: DC
  Administered 2024-06-05 – 2024-06-11 (×7): 10 mg via ORAL
  Filled 2024-06-04 (×7): qty 1

## 2024-06-04 MED ORDER — INSULIN ASPART 100 UNIT/ML IJ SOLN: 0.0000 [IU] | Freq: Three times a day (TID) | INTRAMUSCULAR | Status: DC

## 2024-06-04 MED ORDER — DIPHENHYDRAMINE HCL 25 MG PO CAPS: 25.0000 mg | ORAL_CAPSULE | Freq: Four times a day (QID) | ORAL | Status: DC | PRN

## 2024-06-04 MED ORDER — NEPRO/CARBSTEADY PO LIQD
237.0000 mL | Freq: Every day | ORAL | Status: DC
  Administered 2024-06-05: 237 mL via ORAL

## 2024-06-04 MED ORDER — SODIUM CHLORIDE 0.9 % IV SOLN: 200.0000 mg | INTRAVENOUS | Status: DC

## 2024-06-04 MED ORDER — ACETAMINOPHEN 325 MG PO TABS: 650.0000 mg | ORAL_TABLET | Freq: Four times a day (QID) | ORAL | Status: AC | PRN

## 2024-06-04 MED ORDER — PROCHLORPERAZINE MALEATE 5 MG PO TABS: 5.0000 mg | ORAL_TABLET | Freq: Four times a day (QID) | ORAL | Status: DC | PRN

## 2024-06-04 MED ORDER — INSULIN ASPART 100 UNIT/ML IJ SOLN
0.0000 [IU] | Freq: Three times a day (TID) | INTRAMUSCULAR | Status: DC
  Administered 2024-06-04: 3 [IU] via SUBCUTANEOUS
  Administered 2024-06-06: 1 [IU] via SUBCUTANEOUS
  Administered 2024-06-06: 2 [IU] via SUBCUTANEOUS
  Administered 2024-06-07: 3 [IU] via SUBCUTANEOUS
  Administered 2024-06-08 (×2): 1 [IU] via SUBCUTANEOUS
  Administered 2024-06-09: 2 [IU] via SUBCUTANEOUS
  Administered 2024-06-10 (×2): 1 [IU] via SUBCUTANEOUS
  Administered 2024-06-10: 3 [IU] via SUBCUTANEOUS
  Filled 2024-06-04: qty 1
  Filled 2024-06-04: qty 3
  Filled 2024-06-04: qty 2

## 2024-06-04 MED ORDER — FERRIC CITRATE 1 GM 210 MG(FE) PO TABS: 420.0000 mg | ORAL_TABLET | Freq: Three times a day (TID) | ORAL | Status: DC

## 2024-06-04 MED ORDER — PROSOURCE PLUS PO LIQD
30.0000 mL | Freq: Two times a day (BID) | ORAL | Status: DC
  Administered 2024-06-04 – 2024-06-05 (×3): 30 mL via ORAL
  Filled 2024-06-04 (×3): qty 30

## 2024-06-04 MED ORDER — NEPRO/CARBSTEADY PO LIQD: 237.0000 mL | ORAL | Status: DC

## 2024-06-04 MED ORDER — GERHARDT'S BUTT CREAM
TOPICAL_CREAM | Freq: Every day | CUTANEOUS | Status: DC
  Filled 2024-06-04: qty 60

## 2024-06-04 MED ORDER — SALINE SPRAY 0.65 % NA SOLN
1.0000 | NASAL | Status: DC | PRN
Start: 2024-06-04 — End: 2024-06-11

## 2024-06-04 MED ORDER — NEPRO/CARBSTEADY PO LIQD
237.0000 mL | ORAL | Status: DC
  Administered 2024-06-04: 237 mL via ORAL

## 2024-06-04 MED ORDER — INSULIN ASPART 100 UNIT/ML IJ SOLN: 0.0000 [IU] | Freq: Every day | INTRAMUSCULAR | Status: DC

## 2024-06-04 MED ORDER — CALCIUM CARBONATE ANTACID 500 MG PO CHEW
1.0000 | CHEWABLE_TABLET | Freq: Three times a day (TID) | ORAL | Status: DC | PRN
  Filled 2024-06-04: qty 1

## 2024-06-04 MED ORDER — METOPROLOL TARTRATE 50 MG PO TABS
50.0000 mg | ORAL_TABLET | Freq: Two times a day (BID) | ORAL | Status: DC
  Administered 2024-06-04 – 2024-06-11 (×13): 50 mg via ORAL
  Filled 2024-06-04 (×14): qty 1

## 2024-06-04 MED ORDER — SORBITOL 70 % SOLN: 30.0000 mL | Freq: Every day | Status: DC | PRN

## 2024-06-04 MED ORDER — CLOPIDOGREL BISULFATE 75 MG PO TABS
75.0000 mg | ORAL_TABLET | Freq: Every day | ORAL | Status: DC
  Administered 2024-06-05 – 2024-06-11 (×7): 75 mg via ORAL
  Filled 2024-06-04 (×7): qty 1

## 2024-06-04 MED ORDER — PROCHLORPERAZINE 25 MG RE SUPP: 12.5000 mg | Freq: Four times a day (QID) | RECTAL | Status: DC | PRN

## 2024-06-04 MED ORDER — DARBEPOETIN ALFA 150 MCG/0.3ML IJ SOSY: 150.0000 ug | PREFILLED_SYRINGE | INTRAMUSCULAR | Status: DC

## 2024-06-04 MED ORDER — PROSOURCE PLUS PO LIQD: 30.0000 mL | Freq: Two times a day (BID) | ORAL | Status: DC

## 2024-06-04 MED ORDER — FERRIC CITRATE 1 GM 210 MG(FE) PO TABS
420.0000 mg | ORAL_TABLET | Freq: Three times a day (TID) | ORAL | Status: DC
  Administered 2024-06-04 – 2024-06-11 (×17): 420 mg via ORAL
  Filled 2024-06-04 (×22): qty 2

## 2024-06-04 MED ORDER — GUAIFENESIN-DM 100-10 MG/5ML PO SYRP: 5.0000 mL | ORAL_SOLUTION | Freq: Four times a day (QID) | ORAL | Status: DC | PRN

## 2024-06-04 MED ORDER — INSULIN ASPART 100 UNIT/ML IJ SOLN
8.0000 [IU] | Freq: Three times a day (TID) | INTRAMUSCULAR | Status: DC
  Administered 2024-06-05 – 2024-06-06 (×2): 8 [IU] via SUBCUTANEOUS
  Filled 2024-06-04: qty 8

## 2024-06-04 MED ORDER — HEPARIN SODIUM (PORCINE) 5000 UNIT/ML IJ SOLN
5000.0000 [IU] | Freq: Three times a day (TID) | INTRAMUSCULAR | Status: DC
  Administered 2024-06-04 – 2024-06-09 (×15): 5000 [IU] via SUBCUTANEOUS
  Filled 2024-06-04 (×16): qty 1

## 2024-06-04 MED ORDER — DARBEPOETIN ALFA 150 MCG/0.3ML IJ SOSY
150.0000 ug | PREFILLED_SYRINGE | INTRAMUSCULAR | Status: DC
  Administered 2024-06-08: 150 ug via SUBCUTANEOUS
  Filled 2024-06-04 (×2): qty 0.3

## 2024-06-04 MED ORDER — ACETAMINOPHEN 325 MG PO TABS: 325.0000 mg | ORAL_TABLET | ORAL | Status: DC | PRN

## 2024-06-04 MED ORDER — METOPROLOL TARTRATE 50 MG PO TABS
50.0000 mg | ORAL_TABLET | Freq: Two times a day (BID) | ORAL | Status: DC
  Administered 2024-06-04: 50 mg via ORAL
  Filled 2024-06-04: qty 1

## 2024-06-04 MED ORDER — MILK AND MOLASSES ENEMA: 1.0000 | Freq: Every day | RECTAL | Status: DC | PRN

## 2024-06-04 MED ORDER — METOPROLOL TARTRATE 50 MG PO TABS: 50.0000 mg | ORAL_TABLET | Freq: Two times a day (BID) | ORAL | Status: DC

## 2024-06-04 MED ORDER — HEPARIN SODIUM (PORCINE) 1000 UNIT/ML IJ SOLN
3200.0000 [IU] | Freq: Once | INTRAMUSCULAR | Status: AC
Start: 2024-06-04 — End: 2024-06-04
  Administered 2024-06-04: 3200 [IU]

## 2024-06-04 MED ORDER — INSULIN GLARGINE-YFGN 100 UNIT/ML ~~LOC~~ SOLN
22.0000 [IU] | Freq: Every day | SUBCUTANEOUS | Status: DC
  Administered 2024-06-05 – 2024-06-08 (×3): 22 [IU] via SUBCUTANEOUS
  Filled 2024-06-04 (×6): qty 0.22

## 2024-06-04 MED ORDER — INSULIN ASPART 100 UNIT/ML IJ SOLN: 0.0000 [IU] | Freq: Three times a day (TID) | INTRAMUSCULAR | 11 refills | Status: DC

## 2024-06-04 MED ORDER — RENA-VITE PO TABS: 1.0000 | ORAL_TABLET | Freq: Every day | ORAL | Status: DC

## 2024-06-04 MED ORDER — IRON SUCROSE 200 MG IVPB - SIMPLE MED
200.0000 mg | Status: DC
  Filled 2024-06-04: qty 110

## 2024-06-04 MED ORDER — CARMEX CLASSIC LIP BALM EX OINT
TOPICAL_OINTMENT | CUTANEOUS | Status: DC | PRN
Start: 2024-06-04 — End: 2024-06-11

## 2024-06-04 MED ORDER — TRAZODONE HCL 50 MG PO TABS: 25.0000 mg | ORAL_TABLET | Freq: Every evening | ORAL | Status: DC | PRN

## 2024-06-04 MED ORDER — ORAL CARE MOUTH RINSE: 15.0000 mL | OROMUCOSAL | Status: DC | PRN

## 2024-06-04 MED ORDER — RENA-VITE PO TABS
1.0000 | ORAL_TABLET | Freq: Every day | ORAL | Status: DC
Start: 2024-06-04 — End: 2024-06-11
  Administered 2024-06-04 – 2024-06-10 (×7): 1 via ORAL
  Filled 2024-06-04 (×7): qty 1

## 2024-06-04 MED ORDER — HEPARIN SODIUM (PORCINE) 1000 UNIT/ML IJ SOLN
INTRAMUSCULAR | Status: AC
  Filled 2024-06-04: qty 4

## 2024-06-04 MED ORDER — PROCHLORPERAZINE EDISYLATE 10 MG/2ML IJ SOLN: 5.0000 mg | Freq: Four times a day (QID) | INTRAMUSCULAR | Status: DC | PRN

## 2024-06-04 NOTE — Progress Notes (Signed)
 PMR Admission Coordinator Pre-Admission Assessment   Patient: Danielle Flores is an 58 y.o., female MRN: [id number] DOB: 1966-01-21 Height: 5' 5 (165.1 cm) Weight: 95.8 kg   Insurance Information HMO:    PPO:      PCP:      IPA:      80/20:      OTHER:  PRIMARY: [employer] QHP      Policy#: [id number]       Subscriber:  CM Name: Rosina      Phone#: [phone]    Fax#: [phone] Pre-Cert#: [id number]   I received auth for CIR from [place] with [employer] for admit 06/03/24 through 06/09/24.  Updates due to [place] on 06/10/24 at fax listed above.   Employer:  Benefits:  Phone #: portal Eff Date: 08/08/2023 - 08/06/2024 Deductible: $5,695 ($5,001.57 met) OOP Max: $2,504 ($5,001.57 met) CIR: 50% coverage; 50% co-insurance SNF: 50% coverage; 50% co-insurance; limited to 90 days/cal yr (90 remaining) Outpatient:  50% coverage; 50% co-insurance Home Health:  50% coverage; 50% co-insurance DME: 50% coverage; 50% co-insurance Providers: in network  SECONDARY:       Policy#:      Phone#:    Artist:       Phone#:    The Data Processing Manager" for patients in Inpatient Rehabilitation Facilities with attached "Privacy Act Statement-Health Care Records" was provided and verbally reviewed with: N/A   Emergency Contact Information Contact Information       Name Relation Home Work Mobile    Mullings,Eric Spouse [phone]             Other Contacts   None on File        Current Medical History  Patient Admitting Diagnosis: DKA, Sepsis, Renal Failure History of Present Illness: Pt is a 57 y/o female with PMH for DMT2 on insulin , HTN, HLD, CAD s/p MI 09/2018, S/p 1 coronary stent who presents with worsening flu like symptoms. In the ED she was found to have NSTEMI with increased Toponin 1,322, BG on chenistry 524, K+ 2.9, WBC 14.3, NA 126, Bicarb 12, AG 24. Covid, flu and RSSV negative. Admitted with sebere sepsis, septic chock, and acute renal failure in the  setting of DKA. Negative nitrite and renal US  without obstruction.   She required CRRT Negative nitrite and renal US  without obstruction. Likely ischemic ATN in setting of volume depletion and acute illness as well as hypotension. Pt,. Was found to have a cute respiratory failure with hypoxia in the setting of bilateral multifocal pneumonia. She completed course of Zosyn and was placed on doxycycline. On admission , Pt. With A1c of 9.9%. Placed on subcutaneous Semglee  25 units daily, subcutaneous NovoLog  8 units with meals 3 times daily and sliding scale insulin  coverage. Pt. Was seen by PT/OT and they recommend CIR to assist return to PLOF.     Patient's medical record from [facility] has been reviewed by the rehabilitation admission coordinator and physician.   Past Medical History      Past Medical History:  Diagnosis Date   AKI (acute kidney injury) 05/18/2024   CAD (coronary artery disease) 2021   DKA (diabetic ketoacidosis) (HCC) 05/18/2024   DM2 (diabetes mellitus, type 2) (HCC)     Hyperglycemia     Hyperlipidemia     Hypertension     Patient requiring acute dialysis 05/20/2024   Sepsis (HCC) 05/18/2024          Has the patient had major surgery  during 100 days prior to admission? Yes   Family History   family history includes Diabetes in her mother, sister, and another family member; Heart disease in her maternal grandmother; Sickle cell anemia in her paternal aunt.   Current Medications  Current Medications    Current Facility-Administered Medications:    (feeding supplement) PROSource Plus liquid 30 mL, 30 mL, Oral, BID BM, Harold Scholz, MD, 30 mL at 05/27/24 0921   acetaminophen (TYLENOL) tablet 650 mg, 650 mg, Oral, Q6H PRN, Maree Harder, MD, 650 mg at 05/25/24 0835   bismuth subsalicylate (PEPTO BISMOL) 262 MG/15ML suspension 30 mL, 30 mL, Oral, Q4H PRN, Hall, Carole N, DO, 30 mL at 05/26/24 1917   Chlorhexidine Gluconate Cloth 2 % PADS 6 each, 6  each, Topical, Q0600, Coladonato, Joseph, MD, 6 each at 05/27/24 0453   clopidogrel  (PLAVIX ) tablet 75 mg, 75 mg, Oral, Daily, Gretta Doffing P, DO, 75 mg at 05/27/24 0921   [START ON 05/31/2024] Darbepoetin Alfa (ARANESP) injection 60 mcg, 60 mcg, Subcutaneous, Q Sat-1800, Dennise Hoes, MD   dextrose  50 % solution 0-50 mL, 0-50 mL, Intravenous, PRN, Palumbo, April, MD   docusate sodium (COLACE) capsule 100 mg, 100 mg, Oral, BID PRN, Maree Harder, MD   ezetimibe  (ZETIA ) tablet 10 mg, 10 mg, Oral, Daily, Chand, Sudham, MD, 10 mg at 05/27/24 9077   feeding supplement (ENSURE PLUS HIGH PROTEIN) liquid 237 mL, 237 mL, Oral, BID BM, Chand, Sudham, MD, 237 mL at 05/27/24 9076   Gerhardt's butt cream, , Topical, Daily, Harold Scholz, MD, 1 Application at 05/23/24 0926   heparin  injection 5,000 Units, 5,000 Units, Subcutaneous, Q8H, Chand, Sudham, MD, 5,000 Units at 05/27/24 0552   insulin  aspart (novoLOG ) injection 0-15 Units, 0-15 Units, Subcutaneous, TID WC, Chand, Sudham, MD, 5 Units at 05/27/24 0921   insulin  aspart (novoLOG ) injection 0-5 Units, 0-5 Units, Subcutaneous, QHS, Chand, Sudham, MD, 3 Units at 05/24/24 2218   insulin  aspart (novoLOG ) injection 8 Units, 8 Units, Subcutaneous, TID WC, Harold Scholz, MD, 8 Units at 05/27/24 9077   insulin  glargine-yfgn (SEMGLEE ) injection 25 Units, 25 Units, Subcutaneous, Daily, Harold Scholz, MD, 25 Units at 05/27/24 9078   lip balm (CARMEX) ointment, , Topical, PRN, Girguis, David, MD   multivitamin (RENA-VIT) tablet 1 tablet, 1 tablet, Oral, QHS, Chand, Sudham, MD, 1 tablet at 05/26/24 2138   ondansetron  (ZOFRAN ) injection 4 mg, 4 mg, Intravenous, Q6H PRN, Hoy, Savannah F, PA-C   Oral care mouth rinse, 15 mL, Mouth Rinse, PRN, Gretta Doffing SQUIBB, DO     Patients Current Diet:  Diet Order                  Diet Carb Modified Fluid consistency: Thin; Room service appropriate? Yes with Assist; Fluid restriction: 1500 mL Fluid  Diet effective now                          Precautions / Restrictions Precautions Precautions: Fall Precaution/Restrictions Comments: CRRT Restrictions Weight Bearing Restrictions Per Provider Order: No    Has the patient had 2 or more falls or a fall with injury in the past year? Yes   Prior Activity Level  Pt. Working and active in the community PTA   Prior Functional Level Self Care: Did the patient need help bathing, dressing, using the toilet or eating? Independent   Indoor Mobility: Did the patient need assistance with walking from room to room (with or without device)? Independent  Stairs: Did the patient need assistance with internal or external stairs (with or without device)? Independent   Functional Cognition: Did the patient need help planning regular tasks such as shopping or remembering to take medications? Independent   Patient Information Are you of Hispanic, Latino/a,or Spanish origin?: A. No, not of Hispanic, Latino/a, or Spanish origin What is your race?: B. Black or African American Do you need or want an interpreter to communicate with a doctor or health care staff?: 0. No   Patient's Response To:  Health Literacy and Transportation Is the patient able to respond to health literacy and transportation needs?: Yes Health Literacy - How often do you need to have someone help you when you read instructions, pamphlets, or other written material from your doctor or pharmacy?: Never In the past 12 months, has lack of transportation kept you from medical appointments or from getting medications?: No In the past 12 months, has lack of transportation kept you from meetings, work, or from getting things needed for daily living?: No   Home Assistive Devices / Equipment Home Equipment: None   Prior Device Use: Indicate devices/aids used by the patient prior to current illness, exacerbation or injury? None of the above   Current Functional Level Cognition   Orientation Level: Oriented X4     Extremity Assessment (includes Sensation/Coordination)   Upper Extremity Assessment: Generalized weakness  Lower Extremity Assessment: Generalized weakness, Defer to PT evaluation     ADLs   Overall ADL's : Needs assistance/impaired Eating/Feeding: Set up, Sitting Eating/Feeding Details (indicate cue type and reason): per spouse, pt previously so weak, she was unable to feed self but able to do so now Grooming: Oral care, Wash/dry face, Minimal assistance, Standing Grooming Details (indicate cue type and reason): DOE 3 out 4 RR 40 2L O2 [place]. benefits from energy conservation strategies next session Upper Body Bathing: Moderate assistance Lower Body Bathing: Moderate assistance, Sitting/lateral leans Upper Body Dressing : Moderate assistance, Sitting Lower Body Dressing: Moderate assistance, Sitting/lateral leans Toilet Transfer: +2 for physical assistance, Minimal assistance (eva) General ADL Comments: pt standing on arrival in EVA. End of session education on pursed lip breathing on 2L  02 with RR 38 down to 19. pt with good return demo     Mobility   Overal bed mobility: Needs Assistance Bed Mobility: Supine to Sit, Rolling Rolling: Min assist Supine to sit: Min assist, +2 for safety/equipment General bed mobility comments: Needed assist and cues to roll over and sit up.  Incr time as well.     Transfers   Overall transfer level: Needs assistance Equipment used: None Transfers: Sit to/from Stand Sit to Stand: Min assist, +2 safety/equipment Bed to/from chair/wheelchair/BSC transfer type:: Step pivot Step pivot transfers: Min assist, +2 safety/equipment General transfer comment: cues for hand placement and min assist to come to standing from bed.  Unsteady initially requiring incr assist and cues.     Ambulation / Gait / Stairs / Wheelchair Mobility   Ambulation/Gait Ambulation/Gait assistance: Min assist, +2 safety/equipment Gait Distance (Feet): 25 Feet Assistive device:  Rolling walker (2 wheels) Gait Pattern/deviations: Step-through pattern, Decreased stride length, Decreased step length - right, Decreased step length - left, Shuffle, Drifts right/left, Trunk flexed General Gait Details: Pt was able to ambulate with rW short distance. Pt needing min assist of 2 for stability.    Pt with unsteadiness bil LEs with no knee buckling that required min assist for safety with slow and cautious gait and neeeded assist to steer  RW.  Had to have chair follow. Gait velocity interpretation: <1.31 ft/sec, indicative of household ambulator Pre-gait activities: pt took steps from EOB to recliner at [employer] A without an AD and UE support on physical therapist.     Posture / Balance Balance Overall balance assessment: Needs assistance Sitting-balance support: No upper extremity supported, Feet supported Sitting balance-Leahy Scale: Fair Standing balance support: Bilateral upper extremity supported, During functional activity, Reliant on assistive device for balance Standing balance-Leahy Scale: Poor Standing balance comment: Min A for balance in standing with bil UE suport on RW and external support by therapist     Special considerations/life events  Dialysis: Hemodialysis Tuesday, Thursday, and Saturday, Skin TDC, and Special service needs none    Previous Home Environment (from acute therapy documentation) Living Arrangements: Spouse/significant other Available Help at Discharge: Family, Available 24 hours/day, Friend(s) Type of Home: House Home Layout: One level Home Access: Stairs to enter Entrance Stairs-Rails: None Entrance Stairs-Number of Steps: 3 Bathroom Shower/Tub: Associate Professor: No Home Care Services: No   Discharge Living Setting Plans for Discharge Living Setting: House Type of Home at Discharge: House Discharge Home Layout: One level Discharge Home Access: Stairs to enter Entrance Stairs-Rails:  None Entrance Stairs-Number of Steps: 3 Discharge Bathroom Shower/Tub: Tub/shower unit Discharge Bathroom Toilet: Standard Discharge Bathroom Accessibility: Yes How Accessible: Accessible via walker Does the patient have any problems obtaining your medications?: No   Social/Family/Support Systems Patient Roles: Spouse Contact Information: [phone] Anticipated Caregiver: [place]ellia Caregiver Availability: 24/7 Discharge Plan Discussed with Primary Caregiver: Yes Is Caregiver In Agreement with Plan?: Yes Does Caregiver/Family have Issues with Lodging/Transportation while Pt is in Rehab?: No   Goals Patient/Family Goal for Rehab: PT/OT supervision level Expected length of stay: 10-12 days Pt/Family Agrees to Admission and willing to participate: Yes Program Orientation Provided & Reviewed with Pt/Caregiver Including Roles  & Responsibilities: Yes   Decrease burden of Care through IP rehab admission: Patient/family education   Possible need for SNF placement upon discharge: not anticipated   Patient Condition: Not anticipated   Preadmission Screen Completed By:  Leita KATHEE Kleine, 05/27/2024 1:15 PM ______________________________________________________________________   Discussed status with Dr. Lorilee on 06/04/24 at 1013 and received approval for admission today.   Admission Coordinator:  Leita KATHEE Kleine, CCC-SLP, time 05/19/24/Date 06/04/24    Assessment/Plan: Diagnosis: Debility 2/2 sepsis 2/2 pneumonia Does the need for close, 24 hr/day Medical supervision in concert with the patient's rehab needs make it unreasonable for this patient to be served in a less intensive setting? Yes Co-Morbidities requiring supervision/potential complications: 1) acute kidney injury, 2) microcytic anemia, 3) acute respiratory failure, 4) DM 2, 5) acute metabolic acidosis Due to bladder management, bowel management, safety, skin/wound care, disease management, medication administration, pain  management, and patient education, does the patient require 24 hr/day rehab nursing? Yes Does the patient require coordinated care of a physician, rehab nurse, PT, OT to address physical and functional deficits in the context of the above medical diagnosis(es)? Yes Addressing deficits in the following areas: balance, endurance, locomotion, strength, transferring, bowel/bladder control, bathing, dressing, feeding, grooming, toileting, and psychosocial support Can the patient actively participate in an intensive therapy program of at least 3 hrs of therapy 5 days a week? Yes The potential for patient to make measurable gains while on inpatient rehab is excellent Anticipated functional outcomes upon discharge from inpatient rehab: modified independent PT, modified independent OT, independent SLP Estimated rehab length of stay to reach the above functional  goals is: 8-12 days Anticipated discharge destination: Home 10. Overall Rehab/Functional Prognosis: excellent     MD Signature: Sven Elks, MD

## 2024-06-04 NOTE — Progress Notes (Signed)
Report called to receiving unit.

## 2024-06-04 NOTE — Discharge Summary (Signed)
 Physician Discharge Summary  Danielle Flores FMW:[id number] DOB: 03-Oct-1965 DOA: 05/18/2024  PCP: Antonio Cyndee Jamee JONELLE, DO  Admit date: 05/18/2024 Discharge date: 06/04/24  Admitted From: Home Disposition:  CIR Recommendations for Outpatient Follow-up:  Assess glycemic control and adjust insulin  as appropriate CMP and CBC in 1 week Please follow up on the following pending results: None   Discharge Condition: Stable CODE STATUS: Full code Diet Orders (From admission, onward)     Start     Ordered   06/02/24 0909  Diet Carb Modified Fluid consistency: Thin; Room service appropriate? Yes  Diet effective now       Question Answer Comment  Diet-HS Snack? Nothing   Calorie Level Medium 1600-2000   Fluid consistency: Thin   Room service appropriate? Yes      06/02/24 0908              Hospital course 58 year old **Jehovah's Witness** female with past medical history of CAD status post MI (09/2020) s/p 1 coronary stent, T2DM, HTN, HLD who presented to [facility] ED on 05/18/2024 with 3-day history of rhinorrhea, fever, congestion, cough, shortness of breath, generalized weakness, and 1 day history of nausea and vomiting.  In the ED, the patient was febrile to a Tmax of 102.6 F, tachycardic to 132, tachypneic at 28, BP 121/73, SpO2 89% on RA that improved to 95% on 2 L [place].  She had increased work of breathing and placed on BiPAP.  CBC showed leukocytosis of 14.3.  CMP remarkable for sodium 126, calcium  2.9, bicarb 12, blood glucose 524, creatinine 2.96 (baseline 0.8-1), AST 233, ALT 62, anion gap 24. BhB 1.30. BNP wnl. Initial high sensitive troponin was elevated to 1322 and ultimately peaked at 2036. COVID/flu/RSV negative.  PCCM was consulted and patient was admitted to the ICU for management of septic shock in the setting of multifocal pneumonia, DKA, elevated troponin, AKI, and respiratory failure with respiratory distress  While in ICU, patient was treated for bilateral multifocal  pneumonia with IV Zosyn and doxycycline.  Briefly required vasopressor.  Also started on CRRT for AKI and transitioned to intermittent hemodialysis.  Patient was transferred out of the unit on 05/24/2024.  Therapy recommended CIR.  See individual problem list below for more.   Problems addressed during this hospitalization Septic shock due to bilateral multifocal pneumonia: Resolved.  Completed antibiotic course with Zosyn and doxycycline.  Acute respiratory failure with hypoxia in the setting of the above: Resolved.  On room air.  Acute kidney injury due to septic ATN-no signs of renal recovery.  Started on CRRT without signs of renal recovery.  TDC placed on 06/02/2024. -HD TTS per nephrology. -Dialysis schedule per nephrology -[facility] placed 10/27   Anemia of renal disease: Patient is Jehovah witness. -IV/p.o. iron and Aranesp per nephrology  IDDM-2 with DKA and hyperglycemia: DKA resolved.  Hyperglycemia improved.  A1c 9.9% Recent Labs  Lab 06/03/24 0630 06/03/24 1054 06/03/24 1607 06/03/24 2102 06/04/24 0614  GLUCAP 114* 138* 127* 96 110*  -Discontinue metformin  and Jardiance . -Semglee  25 units daily -SSI-moderate -Further adjustment as appropriate.  Elevated troponin/demand ischemia: Felt to be due to septic shock.  TTE with LVEF > 75%, indeterminate DD and no RWMA.  Evaluated by cardiology and deemed to be demand ischemia in the setting of acute illness.  LDL 39 on 3/18.  Sinus tachycardia/essential hypertension: Tachycardia likely from holding metoprolol .  Seems she was on metoprolol  100 mg 3 times daily that was held on admission -Resumed metoprolol  at  50 mg twice daily and slowly titrate for better rate control based on BP -Discontinued amlodipine .   Physical deconditioning: In the setting of acute illness -Therapy recommended CIR     Hypervolemic hyponatremia - Per nephrology.   Acute metabolic acidosis - Per nephrology.   Shock liver: - Recheck CMP in 1  week  Thrombocytopenia: Resolved.  Class I obesity Body mass index is 31.48 kg/m. Nutrition Problem: Increased nutrient needs Etiology: acute illness Signs/Symptoms: estimated needs Interventions: Refer to RD note for recommendations     Consultations: Cardiology Critical care Nephrology  Time spent 35  minutes  Vital signs Vitals:   06/04/24 0930 06/04/24 1000 06/04/24 1030 06/04/24 1100  BP: 114/66 132/72 130/67 126/71  Pulse: (!) 110 (!) 110 (!) 108 (!) 106  Temp:      Resp: 20 20 20  (!) 22  Height:      Weight:      SpO2: 99% 97% 99% 100%  TempSrc:      BMI (Calculated):         Discharge exam  GENERAL: No apparent distress.  Nontoxic. HEENT: MMM.  Vision and hearing grossly intact.  NECK: Supple.  No apparent JVD.  RESP:  No IWOB.  Fair aeration bilaterally. CVS: HR in 100s.  Regular rhythm.  Heart sounds normal.  ABD/GI/GU: BS+. Abd soft, NTND.  MSK/EXT:  Moves extremities. No apparent deformity. No edema.  SKIN: no apparent skin lesion or wound NEURO: Awake and alert. Oriented appropriately.  No apparent focal neuro deficit. PSYCH: Calm. Normal affect.   Discharge Instructions Discharge Instructions     Increase activity slowly   Complete by: As directed    No wound care   Complete by: As directed       Allergies as of 06/04/2024       Reactions   Lisinopril  Hives, Itching, Swelling   Lip swelling, itching from head to toe, hives chest, breast, and abdomen.   Atorvastatin  Other (See Comments)   Myalgias   Red Blood Cells    Jehova's witness        Medication List     STOP taking these medications    amLODipine  10 MG tablet Commonly known as: NORVASC    Jardiance  25 MG Tabs tablet Generic drug: empagliflozin    metFORMIN  1000 MG tablet Commonly known as: GLUCOPHAGE    NovoLIN  70/30 Kwikpen (70-30) 100 UNIT/ML KwikPen Generic drug: insulin  isophane & regular human KwikPen   rosuvastatin  40 MG tablet Commonly known as:  CRESTOR        TAKE these medications    (feeding supplement) PROSource Plus liquid Take 30 mLs by mouth 2 (two) times daily between meals.   feeding supplement (NEPRO CARB STEADY) Liqd Take 237 mLs by mouth daily.   acetaminophen 325 MG tablet Commonly known as: TYLENOL Take 2 tablets (650 mg total) by mouth every 6 (six) hours as needed for mild pain (pain score 1-3) or headache.   blood glucose meter kit and supplies Relion Prime or Dispense other brand based on patient and insurance preference. Use up to four times daily as directed. (FOR ICD-9 250.00, 250.01).   clopidogrel  75 MG tablet Commonly known as: PLAVIX  Take 1 tablet (75 mg total) by mouth daily.   Darbepoetin Alfa 150 MCG/0.3ML Sosy injection Commonly known as: ARANESP Inject 0.3 mLs (150 mcg total) into the skin every Sunday at 6pm. Start taking on: June 08, 2024   ezetimibe  10 MG tablet Commonly known as: ZETIA  Take 1 tablet by mouth once  daily   ferric citrate 1 GM 210 MG(Fe) tablet Commonly known as: AURYXIA Take 2 tablets (420 mg total) by mouth 3 (three) times daily with meals.   insulin  aspart 100 UNIT/ML injection Commonly known as: novoLOG  Inject 0-15 Units into the skin 3 (three) times daily with meals. CBG 70 - 120: 0 units CBG 121 - 150: 2 units CBG 151 - 200: 3 units CBG 201 - 250: 5 units CBG 251 - 300: 8 units CBG 301 - 350: 11 units CBG 351 - 400: 15 units CBG > 400: call MD and obtain STAT lab verification   insulin  glargine-yfgn 100 UNIT/ML injection Commonly known as: SEMGLEE  Inject 0.25 mLs (25 Units total) into the skin daily.   iron sucrose 200 mg in sodium chloride  0.9 % 100 mL Inject 200 mg into the vein Every Tuesday,Thursday,and Saturday with dialysis for 2 days.   metoprolol  tartrate 50 MG tablet Commonly known as: LOPRESSOR  Take 1 tablet (50 mg total) by mouth 2 (two) times daily. What changed:  medication strength how much to take when to take this   multivitamin  Tabs tablet Take 1 tablet by mouth at bedtime.               Durable Medical Equipment  (From admission, onward)           Start     Ordered   06/02/24 1612  For home use only DME Walker rolling  Once       Question Answer Comment  Walker: With 5 Inch Wheels   Patient needs a walker to treat with the following condition Physical deconditioning      06/02/24 1611             Procedures/Studies: Tunneled dialysis catheter on 06/02/2024   IR TUNNELED CENTRAL VENOUS CATH [facility] W IMG Result Date: 06/02/2024 INDICATION: Acute kidney injury. Patient has a temporary dialysis catheter and needs a tunneled dialysis catheter. Temporary dialysis catheter was removed prior to this procedure. EXAM: FLUOROSCOPIC AND ULTRASOUND GUIDED PLACEMENT OF A TUNNELED DIALYSIS CATHETER Physician: Juliene SAUNDERS. Philip, MD MEDICATIONS: Ancef 2 g; The antibiotic was administered within an appropriate time interval prior to skin puncture. ANESTHESIA/SEDATION: Moderate (conscious) sedation was employed during this procedure. A total of Versed  1.5 mg and fentanyl  50 mcg was administered intravenously at the order of the provider performing the procedure. Total intra-service moderate sedation time: 21 minutes. Patient's level of consciousness and vital signs were monitored continuously by radiology nurse throughout the procedure under the supervision of the provider performing the procedure. FLUOROSCOPY TIME:  Radiation Exposure Index (as provided by the fluoroscopic device): 5 mGy Kerma COMPLICATIONS: None immediate. PROCEDURE: Informed consent was obtained for placement of a tunneled dialysis catheter. The patient was placed supine on the interventional table. Ultrasound confirmed a patent right internal jugular vein. Ultrasound image obtained for documentation. The right neck and chest was prepped and draped in a sterile fashion. Maximal barrier sterile technique was utilized including caps, mask, sterile gowns,  sterile gloves, sterile drape, hand hygiene and skin antiseptic. The right neck was anesthetized with 1% lidocaine . A small incision was made with #11 blade scalpel. A 21 gauge needle directed into the right internal jugular vein with ultrasound guidance. A micropuncture dilator set was placed. A 19 cm tip to cuff Palindrome catheter was selected. The skin below the right clavicle was anesthetized and a small incision was made with an #11 blade scalpel. A subcutaneous tunnel was formed to the vein dermatotomy  site. The catheter was brought through the tunnel. The vein dermatotomy site was dilated to accommodate a peel-away sheath over a wire. The catheter was placed through the peel-away sheath and directed into the central venous structures. The tip of the catheter was placed at superior cavoatrial junction with fluoroscopy. Fluoroscopic images were obtained for documentation. Both lumens were found to aspirate and flush well. The proper amount of heparin  was flushed in both lumens. The vein dermatotomy site was closed using a single layer of absorbable suture and Dermabond. Gel-Foam placed in the subcutaneous tract. The catheter was secured to the skin using Prolene suture. IMPRESSION: Successful placement of a right jugular tunneled dialysis catheter using ultrasound and fluoroscopic guidance. Electronically Signed   By: Juliene Balder M.D.   On: 06/02/2024 10:49   US  RENAL Result Date: 05/27/2024 CLINICAL DATA:  Hematuria EXAM: RENAL / URINARY TRACT ULTRASOUND COMPLETE COMPARISON:  05/18/2024 FINDINGS: Right Kidney: Renal measurements: 11.8 x 5.5 x 4.5 cm. = volume: 153 mL. No mass lesion or hydronephrosis is noted. 1.8 cm cyst is again seen in the midportion of the right kidney better visualized on the current exam. Left Kidney: Renal measurements: 11.8 x 6.5 x 4.5 cm. = volume: 177 mL. Small exophytic cysts are noted somewhat better visualized than on prior ultrasound. Bladder: Decompressed Other: None.  IMPRESSION: Simple cysts bilaterally.  No further follow-up is recommended. No other focal abnormality is seen. Electronically Signed   By: Oneil Devonshire M.D.   On: 05/27/2024 22:44   DG Chest Port 1 View Result Date: 05/22/2024 EXAM: 1 VIEW(S) XRAY OF THE CHEST 05/22/2024 10:35:00 AM COMPARISON: 05/20/2024 CLINICAL HISTORY: pneumonia FINDINGS: LINES, TUBES AND DEVICES: Stable right IJ central venous catheter. LUNGS AND PLEURA: Low lung volumes. Persistent bibasilar airspace opacities, unchanged from prior. HEART AND MEDIASTINUM: Aortic atherosclerosis. BONES AND SOFT TISSUES: No acute osseous abnormality. IMPRESSION: 1. Persistent bibasilar airspace opacities, unchanged from prior. Electronically signed by: Oneil Devonshire MD 05/22/2024 02:25 PM EDT RP Workstation: [id number]   DG CHEST PORT 1 VIEW Result Date: 05/20/2024 CLINICAL DATA:  Status post dialysis catheter insertion EXAM: PORTABLE CHEST 1 VIEW COMPARISON:  05/18/2024 FINDINGS: New right jugular temporary dialysis catheter is seen extending to the cavoatrial junction. No pneumothorax is noted. Persistent bibasilar airspace opacities are noted although somewhat improved when compared with the prior exam. IMPRESSION: Status post temporary dialysis catheter placement. No pneumothorax is noted. Persistent airspace opacities in the bases although somewhat improved when compared with prior exams. Electronically Signed   By: Oneil Devonshire M.D.   On: 05/20/2024 12:53   ECHOCARDIOGRAM COMPLETE Result Date: 05/19/2024    ECHOCARDIOGRAM REPORT   Patient Name:   Danielle Flores Date of Exam: 05/19/2024 Medical Rec #:  [id number]        Height:       65.0 in Accession #:    [id number]       Weight:       235.9 lb Date of Birth:  Jul 19, 1966         BSA:          2.122 m Patient Age:    58 years         BP:           148/73 mmHg Patient Gender: F                HR:           123 bpm. Exam Location:  Inpatient Procedure: 2D Echo (Both Spectral  and Color Flow Doppler  were utilized during            procedure). Indications:    elevated troponin  History:        Patient has prior history of Echocardiogram examinations, most                 recent 09/13/2018. CAD, Signs/Symptoms:Murmur; Risk                 Factors:Hypertension, Diabetes and Dyslipidemia.  Sonographer:    Tinnie Barefoot RDCS Referring Phys: [id number] WHITNEY D HARRIS IMPRESSIONS  1. Peak LVOT gradient at rest 25 mmHG. Left ventricular ejection fraction, by estimation, is >75%. The left ventricle has hyperdynamic function. The left ventricle has no regional wall motion abnormalities. There is severe left ventricular hypertrophy. Left ventricular diastolic parameters are indeterminate. Elevated left atrial pressure. The E/e' is 16.  2. Right ventricular systolic function is normal. The right ventricular size is normal. There is normal pulmonary artery systolic pressure. The estimated right ventricular systolic pressure is 25.8 mmHg.  3. The mitral valve is degenerative. Mild mitral valve regurgitation. No evidence of mitral stenosis.  4. The aortic valve is tricuspid. Aortic valve regurgitation is not visualized. Aortic valve sclerosis is present, with no evidence of aortic valve stenosis.  5. The inferior vena cava is normal in size with greater than 50% respiratory variability, suggesting right atrial pressure of 3 mmHg. Comparison(s): A prior study was performed on 09/13/2018. LVEF 60-65%, mid cavitary gradient with peak gradient , Grade 2 diastolic dysfunction. FINDINGS  Left Ventricle: Peak LVOT gradient at rest 25 mmHG. Left ventricular ejection fraction, by estimation, is >75%. The left ventricle has hyperdynamic function. The left ventricle has no regional wall motion abnormalities. The left ventricular internal cavity size was small. There is severe left ventricular hypertrophy. Left ventricular diastolic parameters are indeterminate. Elevated left atrial pressure. The E/e' is 16. Right Ventricle: The  right ventricular size is normal. No increase in right ventricular wall thickness. Right ventricular systolic function is normal. There is normal pulmonary artery systolic pressure. The tricuspid regurgitant velocity is 2.39 m/s, and  with an assumed right atrial pressure of 3 mmHg, the estimated right ventricular systolic pressure is 25.8 mmHg. Left Atrium: Left atrial size was normal in size. Right Atrium: Right atrial size was normal in size. Pericardium: There is no evidence of pericardial effusion. Mitral Valve: The mitral valve is degenerative in appearance. Mild mitral annular calcification. Mild mitral valve regurgitation. No evidence of mitral valve stenosis. Tricuspid Valve: The tricuspid valve is normal in structure. Tricuspid valve regurgitation is not demonstrated. No evidence of tricuspid stenosis. Aortic Valve: The aortic valve is tricuspid. Aortic valve regurgitation is not visualized. Aortic valve sclerosis is present, with no evidence of aortic valve stenosis. Aortic valve mean gradient measures 22.0 mmHg. Aortic valve peak gradient measures 42.5 mmHg. Pulmonic Valve: The pulmonic valve was normal in structure. Pulmonic valve regurgitation is not visualized. No evidence of pulmonic stenosis. Aorta: The aortic root and ascending aorta are structurally normal, with no evidence of dilitation. Venous: The inferior vena cava is normal in size with greater than 50% respiratory variability, suggesting right atrial pressure of 3 mmHg. IAS/Shunts: The atrial septum is grossly normal.  LEFT VENTRICLE PLAX 2D LVIDd:         3.70 cm   Diastology LVIDs:         2.20 cm   LV e' medial:    6.09 cm/s LV PW:  1.40 cm   LV E/e' medial:  17.4 LV IVS:        1.60 cm   LV e' lateral:   7.07 cm/s LVOT diam:     1.90 cm   LV E/e' lateral: 15.0 LVOT Area:     2.84 cm  RIGHT VENTRICLE             IVC RV Basal diam:  2.30 cm     IVC diam: 1.40 cm RV S prime:     15.30 cm/s TAPSE (M-mode): 1.6 cm LEFT ATRIUM              Index        RIGHT ATRIUM           Index LA diam:        3.30 cm 1.56 cm/m   RA Area:     10.70 cm LA Vol (A2C):   48.2 ml 22.72 ml/m  RA Volume:   23.10 ml  10.89 ml/m LA Vol (A4C):   51.3 ml 24.18 ml/m LA Biplane Vol: 53.3 ml 25.12 ml/m  AORTIC VALVE AV Vmax:      326.00 cm/s AV Vmean:     217.000 cm/s AV VTI:       0.445 m AV Peak Grad: 42.5 mmHg AV Mean Grad: 22.0 mmHg  AORTA Ao Root diam: 3.20 cm Ao Asc diam:  3.30 cm MITRAL VALVE                TRICUSPID VALVE MV Area (PHT): 5.27 cm     TR Peak grad:   22.8 mmHg MV Decel Time: 144 msec     TR Vmax:        239.00 cm/s MV E velocity: 106.00 cm/s MV A velocity: 135.00 cm/s  SHUNTS MV E/A ratio:  0.79         Systemic Diam: 1.90 cm Sunit Tolia Electronically signed by Madonna Large Signature Date/Time: 05/19/2024/5:46:39 PM    Final    CT CHEST ABDOMEN PELVIS WO CONTRAST Result Date: 05/19/2024 CLINICAL DATA:  Sepsis EXAM: CT CHEST, ABDOMEN AND PELVIS WITHOUT CONTRAST TECHNIQUE: Multidetector CT imaging of the chest, abdomen and pelvis was performed following the standard protocol without IV contrast. RADIATION DOSE REDUCTION: This exam was performed according to the departmental dose-optimization program which includes automated exposure control, adjustment of the mA and/or kV according to patient size and/or use of iterative reconstruction technique. COMPARISON:  Chest x-ray from earlier in the same day. FINDINGS: CT CHEST FINDINGS Cardiovascular: Somewhat limited due to lack of IV contrast. Atherosclerotic calcifications are noted. No enlargement of the pulmonary artery is seen. No cardiac enlargement is seen. Mediastinum/Nodes: Thoracic inlet is within normal limits. No hilar or mediastinal adenopathy is noted. The esophagus is within normal limits. Lungs/Pleura: Lungs show bilateral lower lobe, left upper lobe and right middle lobe consolidation increased in the interval from the chest x-ray from hours previous. No sizable effusion is seen. Small  4 mm inflammatory nodule is noted in the left upper lobe best seen on image number 69 of series 5. Musculoskeletal: No chest wall mass or suspicious bone lesions identified. CT ABDOMEN PELVIS FINDINGS Hepatobiliary: No focal liver abnormality is seen. No gallstones, gallbladder wall thickening, or biliary dilatation. Pancreas: Unremarkable. No pancreatic ductal dilatation or surrounding inflammatory changes. Spleen: Normal in size without focal abnormality. Adrenals/Urinary Tract: Adrenal glands are within normal limits. Kidneys demonstrate no renal calculi or obstructive changes. Ureters are within normal limits. Bladder is decompressed by  Foley catheter. Stomach/Bowel: No obstructive or inflammatory changes of the colon are seen. The appendix is not discretely visualized. No inflammatory changes to suggest appendicitis are noted. Small bowel and stomach are within normal limits. Vascular/Lymphatic: Aortic atherosclerosis. No enlarged abdominal or pelvic lymph nodes. Reproductive: Uterine fibroid change is noted. No adnexal mass is seen. Other: No abdominal wall hernia or abnormality. No abdominopelvic ascites. Musculoskeletal: No acute or significant osseous findings. IMPRESSION: Extensive bilateral infiltrates increased when compared with the recent plain film. Small likely postinflammatory nodule in the left upper lobe. No specific follow-up is recommended. Uterine fibroid change. No other focal abnormality is seen. Aortic Atherosclerosis (ICD10-I70.0). Electronically Signed   By: Oneil Devonshire M.D.   On: 05/19/2024 00:54   DG Chest Port 1 View Result Date: 05/18/2024 CLINICAL DATA:  Hypoxia EXAM: PORTABLE CHEST 1 VIEW COMPARISON:  Film from earlier in the same day. FINDINGS: Cardiac shadow is stable. Left lung is clear. Increased consolidation is noted in the right middle lobe along the minor fissure. No sizable effusion is seen. IMPRESSION: Increasing right middle lobe consolidation. Electronically Signed    By: Oneil Devonshire M.D.   On: 05/18/2024 20:44   US  RENAL Result Date: 05/18/2024 CLINICAL DATA:  Acute kidney injury EXAM: RENAL / URINARY TRACT ULTRASOUND COMPLETE COMPARISON:  None Available. FINDINGS: Right Kidney: Renal measurements: 11.7 x 6.8 x 5.8 cm = volume: 237 mL. Normal echotexture. No hydronephrosis. Questionable complex cystic area laterally, not well visualized. Left Kidney: Renal measurements: 11.3 x 5.6 x 5.6 cm = volume: 186 mL. 1.8 cm cyst in the lower pole. Normal echotexture. No hydronephrosis. Bladder: Not visualized. Other: None. IMPRESSION: No acute findings.  No hydronephrosis. Question complex cystic area laterally in the right kidney. This could be further evaluated with contrast-enhanced CT or MRI. Electronically Signed   By: Franky Crease M.D.   On: 05/18/2024 18:01   DG Chest 2 View Result Date: 05/18/2024 CLINICAL DATA:  Shortness of breath EXAM: CHEST - 2 VIEW COMPARISON:  09/13/2018 FINDINGS: The heart size and mediastinal contours are within normal limits. Both lungs are clear. The visualized skeletal structures are unremarkable. IMPRESSION: No active cardiopulmonary disease. Electronically Signed   By: Oneil Devonshire M.D.   On: 05/18/2024 02:10       The results of significant diagnostics from this hospitalization (including imaging, microbiology, ancillary and laboratory) are listed below for reference.     Microbiology: Recent Results (from the past 240 hours)  Culture, blood (Routine X 2) w Reflex to ID Panel     Status: None   Collection Time: 05/25/24  4:36 PM   Specimen: BLOOD RIGHT ARM  Result Value Ref Range Status   Specimen Description BLOOD RIGHT ARM  Final   Special Requests   Final    BOTTLES DRAWN AEROBIC AND ANAEROBIC Blood Culture results may not be optimal due to an inadequate volume of blood received in culture bottles   Culture   Final    NO GROWTH 5 DAYS Performed at [facility], [address] [place], [place] [address]     Report Status 05/30/2024 FINAL  Final  Culture, blood (Routine X 2) w Reflex to ID Panel     Status: None   Collection Time: 05/25/24  4:38 PM   Specimen: BLOOD LEFT ARM  Result Value Ref Range Status   Specimen Description BLOOD LEFT ARM  Final   Special Requests   Final    BOTTLES DRAWN AEROBIC AND ANAEROBIC Blood Culture  adequate volume   Culture   Final    NO GROWTH 5 DAYS Performed at [facility], [address], [place], [place] [address]    Report Status 05/30/2024 FINAL  Final  Gastrointestinal Panel by PCR , Stool     Status: None   Collection Time: 05/26/24  1:54 PM   Specimen: Stool  Result Value Ref Range Status   Campylobacter species NOT DETECTED NOT DETECTED Final   Plesimonas shigelloides NOT DETECTED NOT DETECTED Final   Salmonella species NOT DETECTED NOT DETECTED Final   Yersinia enterocolitica NOT DETECTED NOT DETECTED Final   Vibrio species NOT DETECTED NOT DETECTED Final   Vibrio cholerae NOT DETECTED NOT DETECTED Final   Enteroaggregative E coli (EAEC) NOT DETECTED NOT DETECTED Final   Enteropathogenic E coli (EPEC) NOT DETECTED NOT DETECTED Final   Enterotoxigenic E coli (ETEC) NOT DETECTED NOT DETECTED Final   Shiga like toxin producing E coli (STEC) NOT DETECTED NOT DETECTED Final   Shigella/Enteroinvasive E coli (EIEC) NOT DETECTED NOT DETECTED Final   Cryptosporidium NOT DETECTED NOT DETECTED Final   Cyclospora cayetanensis NOT DETECTED NOT DETECTED Final   Entamoeba histolytica NOT DETECTED NOT DETECTED Final   Giardia lamblia NOT DETECTED NOT DETECTED Final   Adenovirus F40/41 NOT DETECTED NOT DETECTED Final   Astrovirus NOT DETECTED NOT DETECTED Final   Norovirus GI/GII NOT DETECTED NOT DETECTED Final   Rotavirus A NOT DETECTED NOT DETECTED Final   Sapovirus (I, II, IV, and V) NOT DETECTED NOT DETECTED Final    Comment: Performed at [facility], [address] [place], [place] [address]  OVA + PARASITE EXAM     Status: None    Collection Time: 05/26/24  1:54 PM   Specimen: Stool  Result Value Ref Range Status   OVA + PARASITE EXAM Final report  Final    Comment: (NOTE) These results were obtained using wet preparation(s) and trichrome stained smear. This test does not include testing for Cryptosporidium parvum, Cyclospora, or Microsporidia. Performed At: [facility] [address] [place], [place] [id number] Jennette Shorter MD Ey:[id number]    Source of Sample STOOL  Final    Comment: Performed at [facility], [address], [place], [place] [address]  Stool culture     Status: None   Collection Time: 05/26/24  1:54 PM   Specimen: Stool  Result Value Ref Range Status   Salmonella/Shigella Screen Final report  Final   Campylobacter Culture Final report  Final   E coli, Shiga toxin Assay Negative Negative Final    Comment: (NOTE) Performed At: [facility] [address] [place], [place] [id number] Jennette Shorter MD Ey:[id number]   STOOL CULTURE REFLEX - RSASHR     Status: None   Collection Time: 05/26/24  1:54 PM  Result Value Ref Range Status   Stool Culture result 1 (RSASHR) Comment  Final    Comment: (NOTE) No Salmonella or Shigella recovered. Performed At: [facility] [address] [place], [place] [id number] Jennette Shorter MD Ey:[id number]   STOOL CULTURE Reflex - CMPCXR     Status: None   Collection Time: 05/26/24  1:54 PM  Result Value Ref Range Status   Stool Culture result 1 (CMPCXR) Comment  Final    Comment: (NOTE) No Campylobacter species isolated. Performed At: [facility] [address] [place], [place] [id number] Jennette Shorter MD Ey:[id number]      Labs:  CBC: Recent Labs  Lab 05/31/24 [phone] 06/01/24 [phone]  06/02/24 0329 06/03/24 0845 06/04/24 0250  WBC 9.2 6.8 6.6 7.8 7.4  NEUTROABS 7.3 4.9 4.5 6.3 5.5  HGB 7.4* 6.8* 7.0* 7.4* 7.2*  HCT 22.3* 20.7* 21.3* 22.3* 21.9*  MCV 73.4* 75.0* 74.7* 75.1* 76.6*  PLT 422* 336 382 379  352   BMP &GFR Recent Labs  Lab 05/31/24 0538 06/01/24 0821 06/02/24 0329 06/03/24 0845 06/04/24 0250  NA 136 134* 137 138 137  K 4.2 4.0 4.1 4.1 4.2  CL 98 95* 98 100 100  CO2 22 24 21* 20* 18*  GLUCOSE 134* 155* 114* 216* 93  BUN 65* 30* 39* 49* 56*  CREATININE 8.09* 5.65* 7.27* 9.24* 9.75*  CALCIUM  8.1* 7.9* 8.2* 8.3* 8.7*  MG 2.4 2.1 2.1 2.1 2.2  PHOS 9.7* 6.5* 8.8* 10.1* 10.1*   Estimated Creatinine Clearance: 6.6 mL/min (A) (by C-G formula based on SCr of 9.75 mg/dL (H)). Liver & Pancreas: Recent Labs  Lab 05/31/24 0538 06/01/24 9178 06/02/24 0329 06/03/24 0845 06/04/24 0250  ALBUMIN 2.2* 2.3* 2.3* 2.6* 2.5*   No results for input(s): LIPASE, AMYLASE in the last 168 hours. No results for input(s): AMMONIA in the last 168 hours. Diabetic: No results for input(s): HGBA1C in the last 72 hours. Recent Labs  Lab 06/03/24 0630 06/03/24 1054 06/03/24 1607 06/03/24 2102 06/04/24 0614  GLUCAP 114* 138* 127* 96 110*   Cardiac Enzymes: No results for input(s): CKTOTAL, CKMB, CKMBINDEX, TROPONINI in the last 168 hours. No results for input(s): PROBNP in the last 8760 hours. Coagulation Profile: No results for input(s): INR, PROTIME in the last 168 hours. Thyroid  Function Tests: No results for input(s): TSH, T4TOTAL, FREET4, T3FREE, THYROIDAB in the last 72 hours. Lipid Profile: No results for input(s): CHOL, HDL, LDLCALC, TRIG, CHOLHDL, LDLDIRECT in the last 72 hours. Anemia Panel: No results for input(s): VITAMINB12, FOLATE, FERRITIN, TIBC, IRON, RETICCTPCT in the last 72 hours. Urine analysis:    Component Value Date/Time   COLORURINE YELLOW 05/25/2024 1448   APPEARANCEUR CLOUDY (A) 05/25/2024 1448   LABSPEC 1.025 05/25/2024 1448   PHURINE 7.5 05/25/2024 1448   GLUCOSEU NEGATIVE 05/25/2024 1448   GLUCOSEU >=1000 (A) 02/25/2024 1125   HGBUR LARGE (A) 05/25/2024 1448   BILIRUBINUR MODERATE (A)  05/25/2024 1448   BILIRUBINUR Negative 02/25/2024 1117   KETONESUR NEGATIVE 05/25/2024 1448   PROTEINUR >300 (A) 05/25/2024 1448   UROBILINOGEN 0.2 02/25/2024 1125   UROBILINOGEN 0.2 02/25/2024 1117   NITRITE NEGATIVE 05/25/2024 1448   LEUKOCYTESUR SMALL (A) 05/25/2024 1448   Sepsis Labs: Invalid input(s): PROCALCITONIN, LACTICIDVEN   SIGNED:  Franciscojavier Wronski T Chudney Scheffler, MD  [facility] 06/04/2024, 11:19 AM

## 2024-06-04 NOTE — Progress Notes (Signed)
 Navigator contacted local HD clinic regarding pt's referral. Clinic in the process of reviewing pt's info. Clinic staff to f/u with navigator once clinic can confirm acceptance. Pt's d/c plan of CIR vs home still pending at this time as well. Will need to coordinate start date at clinic once d/c plan is confirmed. Will assist as needed.   Randine Mungo Dialysis Navigator [phone]

## 2024-06-04 NOTE — Progress Notes (Signed)
 [facility] Progress Note    Assessment/ Plan:    AKI - in setting of DKA & ischemic ATN in setting of volume depletion and acute illness as well as hypotension. Baseline Cr ~1. UA with gluc, blood, and protein.  Negative nitrite and renal US  without obstruction. S/p CRRT via temp line from 10/14-10/16/25. Transitioned to HD on 10/18  Appreciate VIR placing the right IJ [facility] 10/27 and removing the temp cath HD on TTS sched, can come up to [facility]. No signs of renal recovery at this junction however need STRICT I/O to look at her trajectory.  She actually had 800 cc of urine output piror 24 hours but only past 24hrs. Renal functions continue to worsen.   Seen on dialysis today  126/76 3K bath; no UF.  She knows the hope is to liberalize her from dialysis but may take longer than expected.  Will continue to monitor for renal recovery.  I did give her a heads up that she may be leaving the hospital on dialysis and she is understanding of this.  Avoid nephrotoxic medications including NSAIDs and iodinated intravenous contrast exposure unless the latter is absolutely indicated.  Preferred narcotic agents for pain control are hydromorphone, fentanyl , and methadone. Morphine should not be used. Avoid Baclofen and avoid oral sodium phosphate and magnesium citrate based laxatives / bowel preps. Continue strict Input and Output monitoring. Will monitor the patient closely with you and intervene or adjust therapy as indicated by changes in clinical status/labs   Severe sepsis with septic shock due to RML pneumonia -s/p doxycycline.  Per primary service Hematuria: UA pending (collect when able), renal u/s 10/21 unrevealing, w/u per primary service Acute hypoxic respiratory failure - improved  Acute NSTEMI - on plavix  and being followed by Cardiology. Hyponatremia - due to DKA and AKI, will attempt to manage with HD Hypokalemia - due to DKA. resolved DKA - off insulin  drip per primary  service Abnormal LFT's - likely shock liver.  Per primary Thrombocytopenia - due to acute critical illness--resolved AGMA - due to #1 and #7.  Metformin  stopped.  Improved with renal replacement therapy  Anemia - likely due to critical illness and AKI.  She will not accept blood transfusions (Jehovah's Witness).  ESA dose increased for 10/25.  Receiving IV iron since she is off antibiotics.  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 Will d/w RD. Will recheck phos on Fr. Severe protein malnutrition - alb low.  protein supplements per RD.  Pending CIR but likely to be denied; starting CLIP process for AKI.   Subjective:   Patient seen on dialysis.   She reports that she continues to urinate but objectively inadequate.   Otherwise no complaints; denies fever, chills, nausea, shortness of breath.  Has had more UOP, able to get to restroom.    Objective:   BP 114/66   Pulse (!) 110   Temp 98 F (36.7 C)   Resp 20   Ht 5' 5 (1.651 m)   Wt 85.8 kg   LMP  (LMP Unknown)   SpO2 99%   BMI 31.48 kg/m   Intake/Output Summary (Last 24 hours) at 06/04/2024 0940 Last data filed at 06/03/2024 2009 Gross per 24 hour  Intake 460 ml  Output 400 ml  Net 60 ml    Weight change: -0.045 kg  Physical Exam: Gen: NAD, laying flat in bed  CVS: RRR Resp: CTA B/L, normal wob on RA Jai:dnqu, nt/nd Ext: no edema Neuro: awake, alert Dialysis access: RIJ tunneled HD catheter c/d/i  Imaging: No results found.    Labs: BMET Recent Labs  Lab 05/29/24 0234 05/30/24 0527 05/31/24 0538 06/01/24 0821 06/02/24 0329 06/03/24 0845 06/04/24 0250  NA 135 134* 136 134* 137 138 137  K 4.1 4.1 4.2 4.0 4.1 4.1 4.2  CL 97* 95* 98 95* 98 100 100  CO2 20* 24 22 24  21* 20* 18*  GLUCOSE 125* 140* 134* 155* 114* 216* 93  BUN 91* 49* 65* 30* 39* 49* 56*  CREATININE 8.02* 5.82* 8.09* 5.65* 7.27* 9.24* 9.75*   CALCIUM  7.9* 7.7* 8.1* 7.9* 8.2* 8.3* 8.7*  PHOS 8.5* 6.8* 9.7* 6.5* 8.8* 10.1* 10.1*   CBC Recent Labs  Lab 06/01/24 0821 06/02/24 0329 06/03/24 0845 06/04/24 0250  WBC 6.8 6.6 7.8 7.4  NEUTROABS 4.9 4.5 6.3 5.5  HGB 6.8* 7.0* 7.4* 7.2*  HCT 20.7* 21.3* 22.3* 21.9*  MCV 75.0* 74.7* 75.1* 76.6*  PLT 336 382 379 352    Medications:     (feeding supplement) PROSource Plus  30 mL Oral BID BM   Chlorhexidine Gluconate Cloth  6 each Topical Q0600   clopidogrel   75 mg Oral Daily   darbepoetin (ARANESP) injection - DIALYSIS  150 mcg Subcutaneous Q Sun-1800   ezetimibe   10 mg Oral Daily   feeding supplement (NEPRO CARB STEADY)  237 mL Oral Q24H   ferric citrate  420 mg Oral TID WC   Gerhardt's butt cream   Topical Daily   heparin  injection (subcutaneous)  5,000 Units Subcutaneous Q8H   insulin  aspart  0-15 Units Subcutaneous TID WC   insulin  aspart  0-5 Units Subcutaneous QHS   insulin  aspart  8 Units Subcutaneous TID WC   insulin  glargine-yfgn  22 Units Subcutaneous Daily   lidocaine -EPINEPHrine  20 mL Intradermal Once   multivitamin  1 tablet Oral QHS

## 2024-06-04 NOTE — H&P (Signed)
 Physical Medicine and Rehabilitation Admission H&P    Chief Complaint  Patient presents with   Debility due to multiple medical issues     HPI: Danielle Flores is a 58 year old female with history of CAD, T2DM, HTN, CAD s/p RCA stent who was admitted on 05/18/24 with URI type symptoms,  N/V and generalized weakness. She was found to have NSTEMI, DKA, was febrile with T-102.6, tachycardic and was SOB. She was started on IV insulin , treated with diuresis and broad spectrum antibiotics for RML PNA. Dr. Wang/cardiology felt that elevated trops secondary to type II MI in setting of infection and tachycardia. She had worsening of hypoxia with decrease in UOP requiring BIPAP and diuretics. 2 D echo done revealing hyperdynamic EF > 75% with severe LVH. Cardiology felt elevated trops due to demand ischemia and no further cardiac evaluation needed.   Dr. Aileen consulted for input on  AKi felt to be in setting of septic shock with ATN. CRRT initiated with monitoring for renal recovery. She continued to have poor UOP and has been transitioned to HD.  Electrolyte abnormalities due to DKA and AKI have improved. AGMA felt to in part due to metformin  which was d/c and anemia of critical illness treated with ESA as patient decline blood transfusion.  PT/OT consulted as patient noted to be deconditioned with HR up to 140's with activity. She requires CGA to supervision with mobility. and CIR recommended due to functional decline. Her sister is at bedside.   Review of Systems  Constitutional:  Negative for chills and fever.  HENT:  Negative for hearing loss.   Eyes:  Negative for blurred vision.  Respiratory:  Negative for cough and shortness of breath.   Cardiovascular:  Negative for chest pain and palpitations.  Gastrointestinal:  Negative for constipation, diarrhea and heartburn.  Genitourinary:  Negative for dysuria.  Musculoskeletal:  Negative for back pain.  Neurological:  Negative for  weakness.  Psychiatric/Behavioral:  The patient does not have insomnia.   Denies dizziness   Past Medical History:  Diagnosis Date   AKI (acute kidney injury) 05/18/2024   CAD (coronary artery disease) 2021   DKA (diabetic ketoacidosis) (HCC) 05/18/2024   DM2 (diabetes mellitus, type 2) (HCC)    Hyperglycemia    Hyperlipidemia    Hypertension    Patient requiring acute dialysis 05/20/2024   Sepsis (HCC) 05/18/2024    Past Surgical History:  Procedure Laterality Date   CESAREAN SECTION     CORONARY STENT INTERVENTION N/A 09/16/2018   Procedure: CORONARY STENT INTERVENTION;  Surgeon: Dann Candyce RAMAN, MD;  Location: [facility] INVASIVE CV LAB;  Service: Cardiovascular;  Laterality: N/A;   IR TUNNELED CENTRAL VENOUS CATH PLC W IMG  06/02/2024   LEFT HEART CATH AND CORONARY ANGIOGRAPHY N/A 09/16/2018   Procedure: LEFT HEART CATH AND CORONARY ANGIOGRAPHY;  Surgeon: Dann Candyce RAMAN, MD;  Location: [facility] INVASIVE CV LAB;  Service: Cardiovascular;  Laterality: N/A;    Family History  Problem Relation Age of Onset   Diabetes Mother    Diabetes Sister    Sickle cell anemia Paternal Aunt    Heart disease Maternal Grandmother        heart enlargement   Diabetes Other        1st degree relative    Social History:  reports that she has never smoked. She has never used smokeless tobacco. She reports current alcohol use. She reports that she does not use drugs.    Allergies  Allergen Reactions   Lisinopril  Hives, Itching and Swelling    Lip swelling, itching from head to toe, hives chest, breast, and abdomen.   Atorvastatin  Other (See Comments)    Myalgias   Red Blood Cells     Jehova's witness    Medications Prior to Admission  Medication Sig Dispense Refill   acetaminophen (TYLENOL) 325 MG tablet Take 2 tablets (650 mg total) by mouth every 6 (six) hours as needed for mild pain (pain score 1-3) or headache.     blood glucose meter kit and supplies Relion Prime or Dispense other  brand based on patient and insurance preference. Use up to four times daily as directed. (FOR ICD-9 250.00, 250.01). 1 each 11   clopidogrel  (PLAVIX ) 75 MG tablet Take 1 tablet (75 mg total) by mouth daily. 90 tablet 3   [START ON 06/08/2024] Darbepoetin Alfa (ARANESP) 150 MCG/0.3ML SOSY injection Inject 0.3 mLs (150 mcg total) into the skin every Sunday at 6pm.     ezetimibe  (ZETIA ) 10 MG tablet Take 1 tablet by mouth once daily 90 tablet 3   ferric citrate (AURYXIA) 1 GM 210 MG(Fe) tablet Take 2 tablets (420 mg total) by mouth 3 (three) times daily with meals.     insulin  aspart (NOVOLOG ) 100 UNIT/ML injection Inject 0-15 Units into the skin 3 (three) times daily with meals. CBG 70 - 120: 0 units CBG 121 - 150: 2 units CBG 151 - 200: 3 units CBG 201 - 250: 5 units CBG 251 - 300: 8 units CBG 301 - 350: 11 units CBG 351 - 400: 15 units CBG > 400: call MD and obtain STAT lab verification 10 mL 11   insulin  glargine-yfgn (SEMGLEE ) 100 UNIT/ML injection Inject 0.25 mLs (25 Units total) into the skin daily.     iron sucrose 200 mg in sodium chloride  0.9 % 100 mL Inject 200 mg into the vein Every Tuesday,Thursday,and Saturday with dialysis for 2 days.     metoprolol  tartrate (LOPRESSOR ) 50 MG tablet Take 1 tablet (50 mg total) by mouth 2 (two) times daily.     multivitamin (RENA-VIT) TABS tablet Take 1 tablet by mouth at bedtime.     Nutritional Supplements (,FEEDING SUPPLEMENT, PROSOURCE PLUS) liquid Take 30 mLs by mouth 2 (two) times daily between meals.     Nutritional Supplements (FEEDING SUPPLEMENT, NEPRO CARB STEADY,) LIQD Take 237 mLs by mouth daily.      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, indep with ADLs/IADLs, likes to go to the beach with family   Functional Status:  Mobility: Bed Mobility Overal bed mobility: Needs Assistance Bed Mobility: Supine to Sit Rolling: Min assist Supine to sit: Modified independent (Device/Increase time) Sit to supine: Contact guard assist, HOB elevated, Used rails General bed mobility comments: in chair on entry Transfers Overall transfer level: Needs assistance Equipment used: None Transfers: Sit to/from Stand Sit to Stand: Supervision Bed to/from chair/wheelchair/BSC transfer type:: Step pivot Step pivot transfers: Min assist General transfer comment: able to stand from recliner without AD Ambulation/Gait  Ambulation/Gait assistance: Contact guard assist, Supervision, +2 safety/equipment Gait Distance (Feet): 75 Feet (x2 with standing break) Assistive device: Rolling walker (2 wheels) Gait Pattern/deviations: Step-through pattern, Decreased stride length, Decreased step length - left, Drifts right/left, Trunk flexed General Gait Details: PTA offered her cane as she does not use AD baseline, but pt defers, requesting RW for stability. Pt requesting chair follow, but defers seated break when prompted by PTA when HR >140. Pt took standing break for ~1 minute and HR decreased to ~138 bpm, pt requesting to return to room rather than sitting. HR max ~145 bpm. No LOB or buckling, good RW management, fair pace today. Pt requesting to have mobility specialist assist her to ambulate later in the day. Gait velocity: variable, grossly 0.4-0.6 m/s Gait velocity interpretation: <1.31 ft/sec, indicative of household ambulator Pre-gait  activities: standing hip flexion at RW x10 reps x 3 sets   ADL: ADL Overall ADL's : Needs assistance/impaired Eating/Feeding: Set up, Sitting Eating/Feeding Details (indicate cue type and reason): per spouse, pt previously so weak, she was unable to feed self but able to do so now Grooming: Wash/dry hands, Set up, Sitting Grooming Details (indicate cue type and reason): DOE 3 out 4 RR 40 2L O2 [place]. benefits from energy conservation strategies next session Upper Body Bathing: Moderate assistance Lower Body Bathing: Moderate assistance, Sitting/lateral leans Upper Body Dressing : Moderate assistance, Sitting Lower Body Dressing: Sitting/lateral leans, Sit to/from stand, Supervision/safety Lower Body Dressing Details (indicate cue type and reason): simulated reaching behind without UE support, reaching to feet. no LOB or AD needed Toilet Transfer: Contact guard assist, Rolling walker (2 wheels), BSC/3in1 Toilet Transfer Details (indicate cue type and reason): pt wanting BSC close to bed due to fatigue with transfer HR 121 Toileting- Clothing Manipulation and Hygiene: Minimal assistance, Sit to/from stand Functional mobility during ADLs: Contact guard assist, Rolling walker (2 wheels) General ADL Comments: Pt reports recently transferring to [facility] with nursing staff, declined formal ADL retraining. Emphasis on UE HEP education with theraband, education on progressive activity tolerance and standing balance assessments. Pt able to reach above head without issues and behind self   Cognition: Cognition Orientation Level: Oriented X4 Cognition Arousal: Alert Behavior During Therapy: Flat affect    Blood pressure (!) 141/79, pulse 92, temperature 98.4 F (36.9 C), temperature source Oral, resp. rate 17, height 5' 5 (1.651 m), weight 87.5 kg, SpO2 100%. Physical Exam Gen: no distress, normal appearing HEENT: oral mucosa pink and moist, NCAT Cardio: Reg rate, tachycardic Chest: normal effort,  normal rate of breathing Abd: soft, non-distended Ext: no edema Psych: flat affect, easily irritated, fatigued Skin: intact Neurological:     Mental Status: She is alert and oriented to person, place, and time. 4/5 strength throughout    Results for orders placed or performed during the hospital encounter of 06/04/24 (from the past 48 hours)  Glucose, capillary     Status: Abnormal   Collection Time: 06/04/24  4:11 PM  Result Value Ref Range   Glucose-Capillary 208 (H) 70 - 99 mg/dL    Comment: Glucose reference range applies only to samples taken after fasting for at least 8 hours.   No results found.    Blood pressure (!) 141/79, pulse 92, temperature 98.4 F (36.9 C), temperature source Oral, resp. rate 17, height 5' 5 (1.651 m), weight 87.5 kg, SpO2 100%.  Medical Problem List and Plan: 1. Functional deficits secondary to sepsis 2/2 pneumonia  -patient may shower  -ELOS/Goals: 8-12 days S  Admit to CIR  2.  Antithrombotics: -DVT/anticoagulation:  Pharmaceutical: Heparin   -antiplatelet therapy: Plavix  3. Pain Management: Tylenol prn.  4. Mood/Behavior/Sleep: LCSW to follow for evaluation and support.   -antipsychotic agents: N/A 5. Neuropsych/cognition: This patient is capable of making decisions on her own behalf. 6. Skin/Wound Care: Routine pressure measures. 7. Fluids/Electrolytes/Nutrition: Strict I/O. Daily weights.   8.   Septic shock due to multifocal PNA: Has completed Unasyn-->doxycycline thru 10/21.  --diarrhea has resolved.   9.  T2DM poorly controlled: Hgb A1c-9.9. Monitor BS ac/hs and use SSI for elevated BS. Off metformin . Used 70/30 insulin  bid PTA.    --continue Insulin  glargine 22 units with 8 units novolog  tid ac  10. AKI due to septic shock: HD dependent at this time. Schedule dialysis at the end of the day to help with tolerance of therapy  --Strict I/O. Daily weights.   --continue Auyrxia for elevated phos.   11. Anemia of acute illness:  Treated with Venofer and Aranesp.  --Hgb  improved from 6.8-->7.4  12. CAD: Continue plavix . BB resumed on 10/29 due to sinus tachycardia.   13. Abnormal LFTs: Likely due to shocked liver. Will recheck tomorrow.     I have personally performed a face to face diagnostic evaluation, including, but not limited to relevant history and physical exam findings, of this patient and developed relevant assessment and plan.  Additionally, I have reviewed and concur with the physician assistant's documentation above.  Sharlet GORMAN Schmitz, PA-C   Sven SHAUNNA Elks, MD 06/04/2024

## 2024-06-04 NOTE — Plan of Care (Signed)
  Problem: Education: Goal: Ability to describe self-care measures that may prevent or decrease complications (Diabetes Survival Skills Education) will improve Outcome: Progressing Goal: Individualized Educational Video(s) Outcome: Progressing   Problem: Coping: Goal: Ability to adjust to condition or change in health will improve Outcome: Progressing   Problem: Fluid Volume: Goal: Ability to maintain a balanced intake and output will improve Outcome: Progressing   Problem: Health Behavior/Discharge Planning: Goal: Ability to identify and utilize available resources and services will improve Outcome: Progressing Goal: Ability to manage health-related needs will improve Outcome: Progressing   Problem: Metabolic: Goal: Ability to maintain appropriate glucose levels will improve Outcome: Progressing   Problem: Nutritional: Goal: Maintenance of adequate nutrition will improve Outcome: Progressing Goal: Progress toward achieving an optimal weight will improve Outcome: Progressing   Problem: Skin Integrity: Goal: Risk for impaired skin integrity will decrease Outcome: Progressing   Problem: Tissue Perfusion: Goal: Adequacy of tissue perfusion will improve Outcome: Progressing   Problem: Education: Goal: Knowledge of General Education information will improve Description: Including pain rating scale, medication(s)/side effects and non-pharmacologic comfort measures Outcome: Progressing   Problem: Health Behavior/Discharge Planning: Goal: Ability to manage health-related needs will improve Outcome: Progressing   Problem: Clinical Measurements: Goal: Ability to maintain clinical measurements within normal limits will improve Outcome: Progressing Goal: Will remain free from infection Outcome: Progressing Goal: Diagnostic test results will improve Outcome: Progressing Goal: Respiratory complications will improve Outcome: Progressing Goal: Cardiovascular complication will  be avoided Outcome: Progressing   Problem: Activity: Goal: Risk for activity intolerance will decrease Outcome: Progressing   Problem: Nutrition: Goal: Adequate nutrition will be maintained Outcome: Progressing   Problem: Coping: Goal: Level of anxiety will decrease Outcome: Progressing   Problem: Elimination: Goal: Will not experience complications related to bowel motility Outcome: Progressing Goal: Will not experience complications related to urinary retention Outcome: Progressing   Problem: Pain Managment: Goal: General experience of comfort will improve and/or be controlled Outcome: Progressing   Problem: Safety: Goal: Ability to remain free from injury will improve Outcome: Progressing   Problem: Skin Integrity: Goal: Risk for impaired skin integrity will decrease Outcome: Progressing   Problem: Education: Goal: Knowledge of disease and its progression will improve Outcome: Progressing   Problem: Health Behavior/Discharge Planning: Goal: Ability to manage health-related needs will improve Outcome: Progressing   Problem: Clinical Measurements: Goal: Complications related to the disease process or treatment will be avoided or minimized Outcome: Progressing Goal: Dialysis access will remain free of complications Outcome: Progressing   Problem: Activity: Goal: Activity intolerance will improve Outcome: Progressing   Problem: Fluid Volume: Goal: Fluid volume balance will be maintained or improved Outcome: Progressing   Problem: Nutritional: Goal: Ability to make appropriate dietary choices will improve Outcome: Progressing   Problem: Respiratory: Goal: Respiratory symptoms related to disease process will be avoided Outcome: Progressing   Problem: Self-Concept: Goal: Body image disturbance will be avoided or minimized Outcome: Progressing   Problem: Urinary Elimination: Goal: Progression of disease will be identified and treated Outcome:  Progressing

## 2024-06-04 NOTE — Plan of Care (Signed)
  Problem: Nutritional: Goal: Maintenance of adequate nutrition will improve Outcome: Progressing Goal: Progress toward achieving an optimal weight will improve Outcome: Progressing   Problem: Skin Integrity: Goal: Risk for impaired skin integrity will decrease Outcome: Progressing   Problem: Nutrition: Goal: Adequate nutrition will be maintained Outcome: Progressing   Problem: Coping: Goal: Level of anxiety will decrease Outcome: Progressing   Problem: Elimination: Goal: Will not experience complications related to bowel motility Outcome: Progressing Goal: Will not experience complications related to urinary retention Outcome: Progressing

## 2024-06-04 NOTE — Progress Notes (Signed)
 Received patient in bed to unit.  Alert and oriented.  Informed consent signed and in chart.   TX duration: 3 hours and 15 minutes  Patient tolerated well.  Transported back to the room  Alert, without acute distress.  Hand-off given to patient's nurse.   Access used: R HD internal jugular Cath Access issues: none  Total UF removed:   06/04/24 1121  Vitals  Temp 98.2 F (36.8 C)  BP 134/72  Pulse Rate (!) 109  Resp (!) 24  Weight 85.3 kg  Type of Weight Post-Dialysis  Oxygen Therapy  SpO2 98 %  O2 Device Room Air  Patient Activity (if Appropriate) In bed  Pulse Oximetry Type Continuous  Oximetry Probe Site Changed No  During Treatment Monitoring  Blood Flow Rate (mL/min) 0 mL/min  Arterial Pressure (mmHg) -113.53 mmHg  Venous Pressure (mmHg) 329.48 mmHg  TMP (mmHg) -1.21 mmHg  Ultrafiltration Rate (mL/min) 56 mL/min  Dialysate Flow Rate (mL/min) 300 ml/min  Dialysate Potassium Concentration 3  Dialysate Calcium  Concentration 2.5  Duration of HD Treatment -hour(s) 3.25 hour(s)  Cumulative Fluid Removed (mL) per Treatment  498.18  HD Safety Checks Performed Yes  Intra-Hemodialysis Comments Tolerated well;Tx completed  Post Treatment  Dialyzer Clearance Lightly streaked  Liters Processed 78  Fluid Removed (mL) 500 mL  Tolerated HD Treatment Yes  Hemodialysis Catheter Right Internal jugular Double lumen Permanent (Tunneled)  Placement Date/Time: 06/02/24 0824   Serial / Lot #: [id number]  Expiration Date: 01/04/29  Time Out: Correct patient;Correct site;Correct procedure  Maximum sterile barrier precautions: Hand hygiene;Cap;Mask;Sterile gown;Sterile gloves;Large sterile s...  Site Condition No complications  Blue Lumen Status Flushed;Antimicrobial dead end cap;Dead end cap in place  Red Lumen Status Flushed;Dead end cap in place;Antimicrobial dead end cap  Purple Lumen Status N/A  Catheter fill solution Heparin  1000 units/ml  Catheter fill volume (Arterial)  1.6 cc  Catheter fill volume (Venous) 1.6  Dressing Type Transparent  Dressing Status Antimicrobial disc/dressing in place;Clean, Dry, Intact  Drainage Description None  Dressing Change Due 06/11/24  Post treatment catheter status Capped and Clamped     Camellia Brasil LPN [facility]

## 2024-06-04 NOTE — Progress Notes (Signed)
 Inpatient Rehabilitation Admission Medication Review by a Pharmacist  A complete drug regimen review was completed for this patient to identify any potential clinically significant medication issues.  High Risk Drug Classes Is patient taking? Indication by Medication  Antipsychotic Yes, as an intravenous medication Prochlorperazine - PRN nausea/vomiting  Anticoagulant Yes SQ heparin  - VTE ppx  Antibiotic No   Opioid No   Antiplatelet Yes Clopidogrel  - CAD  Hypoglycemics/insulin  Yes Insulin  aspart, insulin  glargine - DM  Vasoactive Medication Yes Metoprolol  - HTN  Chemotherapy No   Other Yes Darbepoetin - ESRD anemia Auryxia - hyperphosphatemia  Ezetimibe  - HLD Iron sucrose - iron deficiency   MV - supplement Diphenhydramine - PRN itching Robitussin DM - PRN cough Loperamide - PRN diarrhea Trazodone - PRN sleep Sorbitol - constipation     Type of Medication Issue Identified Description of Issue Recommendation(s)  Drug Interaction(s) (clinically significant)     Duplicate Therapy     Allergy     No Medication Administration End Date     Incorrect Dose     Additional Drug Therapy Needed     Significant med changes from prior encounter (inform family/care partners about these prior to discharge). Amlodipine , empagliflozin , metformin , rosuvastatin , and insulin  70/30 were stopped at DC Communicate relevant medication changes to patient/family members at discharge from CIR.   Other       Clinically significant medication issues were identified that warrant physician communication and completion of prescribed/recommended actions by midnight of the next day:  No   Pharmacist comments: n/a   Time spent performing this drug regimen review (minutes): 20   Thank you for allowing pharmacy to be a part of this patient's care.  Shelba Collier, PharmD, BCPS Clinical Pharmacist

## 2024-06-04 NOTE — Progress Notes (Signed)
 Inpatient Rehab Admissions Coordinator:   I have a CIR bed for this Pt. Today. RN may call report to [phone].   Pt. To admit to CIR for estimated 5-7 days with the goal of reaching mod I goals and returning home with her husband.  Leita Kleine, MS, CCC-SLP Rehab Admissions Coordinator  [phone] (celll) [phone] (office)

## 2024-06-04 NOTE — H&P (Incomplete)
 Physical Medicine and Rehabilitation Admission H&P    Chief Complaint  Patient presents with   Debility due to multiple medical issues     HPI: Danielle Flores. Danielle Flores is a 58 year old female with history of CAD, T2DM, HTN, CAD s/p RCA stent who was admitted on 05/18/24 with URI type symptoms,  N/V and generalized weakness. She was found to have NSTEMI, DKA, was febrile with T-102.6, tachycardic and was SOB. She was started on IV insulin , treated with diuresis and broad spectrum antibiotics for RML PNA. Dr. Wang/cardiology felt that elevated trops secondary to type II MI in setting of infection and tachycardia. She had worsening of hypoxia with decrease in UOP requiring BIPAP and diuretics. 2 D echo done revealing hyperdynamic EF > 75% with severe LVH. Cardiology felt elevated trops due to demand ischemia and no further cardiac evaluation needed.   Dr. Aileen consulted for input on  AKi felt to be in setting of septic shock with ATN. CRRT initiated with monitoring for renal recovery. She continued to have poor UOP and has been transitioned to HD.  Electrolyte abnormalities due to DKA and AKI have improved. AGMA felt to in part due to metformin  which was d/c and anemia of critical illness treated with ESA as patient decline blood transfusion.  PT/OT consulted as patient noted to be deconditioned with HR up to 140's with activity. She requires CGA to supervision with mobility. and CIR recommended due to functional decline.    Review of Systems  Constitutional:  Negative for chills and fever.  HENT:  Negative for hearing loss.   Eyes:  Negative for blurred vision.  Respiratory:  Negative for cough and shortness of breath.   Cardiovascular:  Negative for chest pain and palpitations.  Gastrointestinal:  Negative for constipation, diarrhea and heartburn.  Genitourinary:  Negative for dysuria.  Musculoskeletal:  Negative for back pain.  Neurological:  Negative for weakness.   Psychiatric/Behavioral:  The patient does not have insomnia.      Past Medical History:  Diagnosis Date   AKI (acute kidney injury) 05/18/2024   CAD (coronary artery disease) 2021   DKA (diabetic ketoacidosis) (HCC) 05/18/2024   DM2 (diabetes mellitus, type 2) (HCC)    Hyperglycemia    Hyperlipidemia    Hypertension    Patient requiring acute dialysis 05/20/2024   Sepsis (HCC) 05/18/2024    Past Surgical History:  Procedure Laterality Date   CESAREAN SECTION     CORONARY STENT INTERVENTION N/A 09/16/2018   Procedure: CORONARY STENT INTERVENTION;  Surgeon: Dann Candyce RAMAN, MD;  Location: [facility] INVASIVE CV LAB;  Service: Cardiovascular;  Laterality: N/A;   IR TUNNELED CENTRAL VENOUS CATH PLC W IMG  06/02/2024   LEFT HEART CATH AND CORONARY ANGIOGRAPHY N/A 09/16/2018   Procedure: LEFT HEART CATH AND CORONARY ANGIOGRAPHY;  Surgeon: Dann Candyce RAMAN, MD;  Location: [facility] INVASIVE CV LAB;  Service: Cardiovascular;  Laterality: N/A;    Family History  Problem Relation Age of Onset   Diabetes Mother    Diabetes Sister    Sickle cell anemia Paternal Aunt    Heart disease Maternal Grandmother        heart enlargement   Diabetes Other        1st degree relative    Social History:  reports that she has never smoked. She has never used smokeless tobacco. She reports current alcohol use. She reports that she does not use drugs.    Allergies  Allergen Reactions   Lisinopril   Hives, Itching and Swelling    Lip swelling, itching from head to toe, hives chest, breast, and abdomen.   Atorvastatin  Other (See Comments)    Myalgias   Red Blood Cells     Jehova's witness    Medications Prior to Admission  Medication Sig Dispense Refill   amLODipine  (NORVASC ) 10 MG tablet Take 1 tablet (10 mg total) by mouth daily. 90 tablet 1   clopidogrel  (PLAVIX ) 75 MG tablet Take 1 tablet (75 mg total) by mouth daily. 90 tablet 3   ezetimibe  (ZETIA ) 10 MG tablet Take 1 tablet by mouth once  daily 90 tablet 3   insulin  isophane & regular human KwikPen (NOVOLIN  70/30 KWIKPEN) (70-30) 100 UNIT/ML KwikPen Inject 26 Units into the skin in the morning and at bedtime. 15 mL 1   metoprolol  tartrate (LOPRESSOR ) 100 MG tablet Take 1 tablet (100 mg total) by mouth 3 (three) times daily. 270 tablet 3   rosuvastatin  (CRESTOR ) 40 MG tablet Take 1 tablet (40 mg total) by mouth daily. Needs an appt 30 tablet 0   blood glucose meter kit and supplies Rel[employer] Prime or Dispense other brand based on patient and insurance preference. Use up to four times daily as directed. (FOR ICD-9 250.00, 250.01). 1 each 11   empagliflozin  (JARDIANCE ) 25 MG TABS tablet TAKE 1 TABLET BY MOUTH ONCE DAILY BEFORE BREAKFAST (Patient not taking: Reported on 05/18/2024) 90 tablet 1   metFORMIN  (GLUCOPHAGE ) 1000 MG tablet TAKE 1/2 (ONE-HALF) TABLET BY MOUTH TWICE DAILY WITH A MEAL 180 tablet 0    Home: Home Living Family/patient expects to be discharged to:: Private residence Living Arrangements: Spouse/significant other Available Help at Discharge: Family, Available 24 hours/day, Friend(s) Type of Home: House Home Access: Stairs to enter Secretary/administrator of Steps: 3 Entrance Stairs-Rails: None Home Layout: One level Bathroom Shower/Tub: Engineer, Manufacturing Systems: Standard Bathroom Accessibility: No Home Equipment: None   Functional History: Prior Function Prior Level of Function : Independent/Modified Independent, Working/employed, Driving Mobility Comments: very ind prior to hospitalization 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, indep with ADLs/IADLs, likes to go to the beach with family  Functional Status:  Mobility: Bed Mobility Overal bed mobility: Needs Assistance Bed Mobility: Supine to Sit Rolling: Min assist Supine to sit: Modified independent (Device/Increase time) Sit to supine: Contact guard assist, HOB elevated, Used rails General bed mobility comments: in chair on  entry Transfers Overall transfer level: Needs assistance Equipment used: None Transfers: Sit to/from Stand Sit to Stand: Supervision Bed to/from chair/wheelchair/BSC transfer type:: Step pivot Step pivot transfers: Min assist General transfer comment: able to stand from recliner without AD Ambulation/Gait Ambulation/Gait assistance: Contact guard assist, Supervision, +2 safety/equipment Gait Distance (Feet): 75 Feet (x2 with standing break) Assistive device: Rolling walker (2 wheels) Gait Pattern/deviations: Step-through pattern, Decreased stride length, Decreased step length - left, Drifts right/left, Trunk flexed General Gait Details: PTA offered her cane as she does not use AD baseline, but pt defers, requesting RW for stability. Pt requesting chair follow, but defers seated break when prompted by PTA when HR >140. Pt took standing break for ~1 minute and HR decreased to ~138 bpm, pt requesting to return to room rather than sitting. HR max ~145 bpm. No LOB or buckling, good RW management, fair pace today. Pt requesting to have mobility specialist assist her to ambulate later in the day. Gait velocity: variable, grossly 0.4-0.6 m/s Gait velocity interpretation: <1.31 ft/sec, indicative of household ambulator Pre-gait activities: standing hip flexion at RW x10 reps x  3 sets    ADL: ADL Overall ADL's : Needs assistance/impaired Eating/Feeding: Set up, Sitting Eating/Feeding Details (indicate cue type and reason): per spouse, pt previously so weak, she was unable to feed self but able to do so now Grooming: Wash/dry hands, Set up, Sitting Grooming Details (indicate cue type and reason): DOE 3 out 4 RR 40 2L O2 [place]. benefits from energy conservation strategies next session Upper Body Bathing: Moderate assistance Lower Body Bathing: Moderate assistance, Sitting/lateral leans Upper Body Dressing : Moderate assistance, Sitting Lower Body Dressing: Sitting/lateral leans, Sit to/from stand,  Supervision/safety Lower Body Dressing Details (indicate cue type and reason): simulated reaching behind without UE support, reaching to feet. no LOB or AD needed Toilet Transfer: Contact guard assist, Rolling walker (2 wheels), BSC/3in1 Toilet Transfer Details (indicate cue type and reason): pt wanting BSC close to bed due to fatigue with transfer HR 121 Toileting- Clothing Manipulation and Hygiene: Minimal assistance, Sit to/from stand Functional mobility during ADLs: Contact guard assist, Rolling walker (2 wheels) General ADL Comments: Pt reports recently transferring to [facility] with nursing staff, declined formal ADL retraining. Emphasis on UE HEP education with theraband, education on progressive activity tolerance and standing balance assessments. Pt able to reach above head without issues and behind self  Cognition: Cognition Orientation Level: Oriented X4 Cognition Arousal: Alert Behavior During Therapy: Flat affect   Blood pressure 125/76, pulse (!) 110, temperature 98 F (36.7 C), resp. rate 18, height 5' 5 (1.651 m), weight 85.8 kg, SpO2 99%. Physical Exam Vitals and nursing note reviewed.  Constitutional:      Appearance: She is well-developed.     Comments: Upset about multiple items and mildly agitated.   Neurological:     Mental Status: She is alert and oriented to person, place, and time.     Results for orders placed or performed during the hospital encounter of 05/18/24 (from the past 48 hours)  Glucose, capillary     Status: Abnormal   Collection Time: 06/02/24 11:16 AM  Result Value Ref Range   Glucose-Capillary 228 (H) 70 - 99 mg/dL    Comment: Glucose reference range applies only to samples taken after fasting for at least 8 hours.  Glucose, capillary     Status: Abnormal   Collection Time: 06/02/24  4:06 PM  Result Value Ref Range   Glucose-Capillary 125 (H) 70 - 99 mg/dL    Comment: Glucose reference range applies only to samples taken after fasting for at  least 8 hours.  Glucose, capillary     Status: None   Collection Time: 06/02/24  9:23 PM  Result Value Ref Range   Glucose-Capillary 72 70 - 99 mg/dL    Comment: Glucose reference range applies only to samples taken after fasting for at least 8 hours.  Glucose, capillary     Status: Abnormal   Collection Time: 06/03/24  6:30 AM  Result Value Ref Range   Glucose-Capillary 114 (H) 70 - 99 mg/dL    Comment: Glucose reference range applies only to samples taken after fasting for at least 8 hours.  Renal function panel (daily at 0500)     Status: Abnormal   Collection Time: 06/03/24  8:45 AM  Result Value Ref Range   Sodium 138 135 - 145 mmol/L   Potassium 4.1 3.5 - 5.1 mmol/L   Chloride 100 98 - 111 mmol/L   CO2 20 (L) 22 - 32 mmol/L   Glucose, Bld 216 (H) 70 - 99 mg/dL    Comment:  Glucose reference range applies only to samples taken after fasting for at least 8 hours.   BUN 49 (H) 6 - 20 mg/dL   Creatinine, Ser 0.75 (H) 0.44 - 1.00 mg/dL   Calcium  8.3 (L) 8.9 - 10.3 mg/dL   Phosphorus 89.8 (H) 2.5 - 4.6 mg/dL   Albumin 2.6 (L) 3.5 - 5.0 g/dL   GFR, Estimated 4 (L) >60 mL/min    Comment: (NOTE) Calculated using the CKD-EPI Creatinine Equation (2021)    Anion gap 18 (H) 5 - 15    Comment: Performed at [facility], [address], [place], [place] [address]  Magnesium     Status: None   Collection Time: 06/03/24  8:45 AM  Result Value Ref Range   Magnesium 2.1 1.7 - 2.4 mg/dL    Comment: Performed at [facility], [address] [place], [place] [address]  CBC with Differential/Platelet     Status: Abnormal   Collection Time: 06/03/24  8:45 AM  Result Value Ref Range   WBC 7.8 4.0 - 10.5 K/uL   RBC 2.97 (L) 3.87 - 5.11 MIL/uL   Hemoglobin 7.4 (L) 12.0 - 15.0 g/dL    Comment: Reticulocyte Hemoglobin testing may be clinically indicated, consider ordering this additional test [id number]    HCT 22.3 (L) 36.0 - 46.0 %   MCV 75.1 (L) 80.0 - 100.0 fL   MCH 24.9 (L)  26.0 - 34.0 pg   MCHC 33.2 30.0 - 36.0 g/dL   RDW 80.9 (H) 88.4 - 84.4 %   Platelets 379 150 - 400 K/uL   nRBC 0.0 0.0 - 0.2 %   Neutrophils Relative % 81 %   Neutro Abs 6.3 1.7 - 7.7 K/uL   Lymphocytes Relative 13 %   Lymphs Abs 1.0 0.7 - 4.0 K/uL   Monocytes Relative 5 %   Monocytes Absolute 0.4 0.1 - 1.0 K/uL   Eosinophils Relative 0 %   Eosinophils Absolute 0.0 0.0 - 0.5 K/uL   Basophils Relative 1 %   Basophils Absolute 0.0 0.0 - 0.1 K/uL   Immature Granulocytes 0 %   Abs Immature Granulocytes 0.03 0.00 - 0.07 K/uL    Comment: Performed at [facility], [address], [place], [place] [address]  Glucose, capillary     Status: Abnormal   Collection Time: 06/03/24 10:54 AM  Result Value Ref Range   Glucose-Capillary 138 (H) 70 - 99 mg/dL    Comment: Glucose reference range applies only to samples taken after fasting for at least 8 hours.  Glucose, capillary     Status: Abnormal   Collection Time: 06/03/24  4:07 PM  Result Value Ref Range   Glucose-Capillary 127 (H) 70 - 99 mg/dL    Comment: Glucose reference range applies only to samples taken after fasting for at least 8 hours.  Glucose, capillary     Status: None   Collection Time: 06/03/24  9:02 PM  Result Value Ref Range   Glucose-Capillary 96 70 - 99 mg/dL    Comment: Glucose reference range applies only to samples taken after fasting for at least 8 hours.  Renal function panel (daily at 0500)     Status: Abnormal   Collection Time: 06/04/24  2:50 AM  Result Value Ref Range   Sodium 137 135 - 145 mmol/L   Potassium 4.2 3.5 - 5.1 mmol/L   Chloride 100 98 - 111 mmol/L   CO2 18 (L) 22 - 32 mmol/L  Glucose, Bld 93 70 - 99 mg/dL    Comment: Glucose reference range applies only to samples taken after fasting for at least 8 hours.   BUN 56 (H) 6 - 20 mg/dL   Creatinine, Ser 0.24 (H) 0.44 - 1.00 mg/dL   Calcium  8.7 (L) 8.9 - 10.3 mg/dL   Phosphorus 89.8 (H) 2.5 - 4.6 mg/dL   Albumin 2.5 (L) 3.5 - 5.0 g/dL   GFR,  Estimated 4 (L) >60 mL/min    Comment: (NOTE) Calculated using the CKD-EPI Creatinine Equation (2021)    Anion gap 19 (H) 5 - 15    Comment: Performed at [facility], [address], [place], [place] [address]  Magnesium     Status: None   Collection Time: 06/04/24  2:50 AM  Result Value Ref Range   Magnesium 2.2 1.7 - 2.4 mg/dL    Comment: Performed at [facility], [address], [place], [place] [address]  CBC with Differential/Platelet     Status: Abnormal   Collection Time: 06/04/24  2:50 AM  Result Value Ref Range   WBC 7.4 4.0 - 10.5 K/uL   RBC 2.86 (L) 3.87 - 5.11 MIL/uL   Hemoglobin 7.2 (L) 12.0 - 15.0 g/dL    Comment: Reticulocyte Hemoglobin testing may be clinically indicated, consider ordering this additional test [id number]    HCT 21.9 (L) 36.0 - 46.0 %   MCV 76.6 (L) 80.0 - 100.0 fL   MCH 25.2 (L) 26.0 - 34.0 pg   MCHC 32.9 30.0 - 36.0 g/dL   RDW 80.5 (H) 88.4 - 84.4 %   Platelets 352 150 - 400 K/uL   nRBC 0.3 (H) 0.0 - 0.2 %   Neutrophils Relative % 73 %   Neutro Abs 5.5 1.7 - 7.7 K/uL   Lymphocytes Relative 16 %   Lymphs Abs 1.2 0.7 - 4.0 K/uL   Monocytes Relative 6 %   Monocytes Absolute 0.5 0.1 - 1.0 K/uL   Eosinophils Relative 4 %   Eosinophils Absolute 0.3 0.0 - 0.5 K/uL   Basophils Relative 1 %   Basophils Absolute 0.0 0.0 - 0.1 K/uL   Immature Granulocytes 0 %   Abs Immature Granulocytes 0.02 0.00 - 0.07 K/uL    Comment: Performed at [facility], [address] [place], [place] [address]  Glucose, capillary     Status: Abnormal   Collection Time: 06/04/24  6:14 AM  Result Value Ref Range   Glucose-Capillary 110 (H) 70 - 99 mg/dL    Comment: Glucose reference range applies only to samples taken after fasting for at least 8 hours.   No results found.    Blood pressure 125/76, pulse (!) 110, temperature 98 F (36.7 C), resp. rate 18, height 5' 5 (1.651 m), weight 85.8 kg, SpO2 99%.  Medical Problem List and Plan: 1.  Functional deficits secondary to ***  -patient may *** shower  -ELOS/Goals: *** 2.  Antithrombotics: -DVT/anticoagulation:  Pharmaceutical: Heparin   -antiplatelet therapy: Plavix  3. Pain Management: Tylenol prn.  4. Mood/Behavior/Sleep: LCSW to follow for evaluation and support.   -antipsychotic agents: N/A 5. Neuropsych/cognition: This patient is capable of making decisions on her own behalf. 6. Skin/Wound Care: Routine pressure measures. 7. Fluids/Electrolytes/Nutrition: Strict I/O. Daily weights.  8.   Septic shock due to multifocal PNA: Has completed Unasyn-->doxycycline thru 10/21.  --diarrhea has resolved.  9.  T2DM poorly controlled: Hgb A1c-9.9. Monitor BS ac/hs and use SSI for elevated BS. Off metformin .  Used 70/30 insulin  bid PTA.    --on Insulin  glargine 22 units with 8 units novolog  tid ac 10. AKI due to septic shock: HD dependent at this time. Schedule dialysis at the end of the day to help with tolerance of therapy  --Strict I/O. Daily weights.   --on Auyrxia for elevated phos.  11. Anemia of acute illness: Treated with Venofer and Aranesp.  --Hgb  improved from 6.8-->7.4 12. CAD:  On plavix . BB resumed on 10/29 due to sinus tachycardia. .  13. Abnormal LFTs: Likely due to shocked liver. Will recheck tomorrow.       ***  Sharlet GORMAN Schmitz, PA-C 06/04/2024

## 2024-06-05 ENCOUNTER — Inpatient Hospital Stay (HOSPITAL_COMMUNITY)

## 2024-06-05 DIAGNOSIS — N170 Acute kidney failure with tubular necrosis: Secondary | ICD-10-CM | POA: Diagnosis not present

## 2024-06-05 DIAGNOSIS — R5381 Other malaise: Secondary | ICD-10-CM | POA: Diagnosis not present

## 2024-06-05 DIAGNOSIS — D62 Acute posthemorrhagic anemia: Secondary | ICD-10-CM

## 2024-06-05 LAB — RENAL FUNCTION PANEL
Albumin: 2.6 g/dL — ABNORMAL LOW (ref 3.5–5.0)
Anion gap: 17 — ABNORMAL HIGH (ref 5–15)
BUN: 40 mg/dL — ABNORMAL HIGH (ref 6–20)
CO2: 23 mmol/L (ref 22–32)
Calcium: 8.5 mg/dL — ABNORMAL LOW (ref 8.9–10.3)
Chloride: 94 mmol/L — ABNORMAL LOW (ref 98–111)
Creatinine, Ser: 6.94 mg/dL — ABNORMAL HIGH (ref 0.44–1.00)
GFR, Estimated: 6 mL/min — ABNORMAL LOW (ref >60)
Glucose, Bld: 154 mg/dL — ABNORMAL HIGH (ref 70–99)
Phosphorus: 7.2 mg/dL — ABNORMAL HIGH (ref 2.5–4.6)
Potassium: 3.7 mmol/L (ref 3.5–5.1)
Sodium: 134 mmol/L — ABNORMAL LOW (ref 135–145)

## 2024-06-05 LAB — GLUCOSE, CAPILLARY
Glucose-Capillary: 95 mg/dL (ref 70–99)
Glucose-Capillary: 81 mg/dL (ref 70–99)
Glucose-Capillary: 63 mg/dL — ABNORMAL LOW (ref 70–99)
Glucose-Capillary: 90 mg/dL (ref 70–99)
Glucose-Capillary: 110 mg/dL — ABNORMAL HIGH (ref 70–99)

## 2024-06-05 LAB — HEPATIC FUNCTION PANEL
ALT: 9 U/L (ref 0–44)
AST: 33 U/L (ref 15–41)
Albumin: 2.6 g/dL — ABNORMAL LOW (ref 3.5–5.0)
Alkaline Phosphatase: 72 U/L (ref 38–126)
Bilirubin, Direct: <0.1 mg/dL (ref 0.0–0.2)
Total Bilirubin: 0.4 mg/dL (ref 0.0–1.2)
Total Protein: 7.1 g/dL (ref 6.5–8.1)

## 2024-06-05 MED ORDER — PROSOURCE PLUS PO LIQD
30.0000 mL | Freq: Every day | ORAL | Status: DC
  Administered 2024-06-06 – 2024-06-11 (×4): 30 mL via ORAL
  Filled 2024-06-05 (×6): qty 30

## 2024-06-05 MED ORDER — NEPRO/CARBSTEADY PO LIQD
237.0000 mL | Freq: Two times a day (BID) | ORAL | Status: DC
  Administered 2024-06-06 – 2024-06-11 (×9): 237 mL via ORAL

## 2024-06-05 MED ORDER — CHLORHEXIDINE GLUCONATE CLOTH 2 % EX PADS
6.0000 | MEDICATED_PAD | Freq: Every day | CUTANEOUS | Status: DC
Start: 2024-06-05 — End: 2024-06-05

## 2024-06-05 MED ORDER — CHLORHEXIDINE GLUCONATE CLOTH 2 % EX PADS
6.0000 | MEDICATED_PAD | Freq: Two times a day (BID) | CUTANEOUS | Status: DC
  Administered 2024-06-05 – 2024-06-10 (×9): 6 via TOPICAL

## 2024-06-05 MED ORDER — SODIUM CHLORIDE 0.9 % IV SOLN
200.0000 mg | INTRAVENOUS | Status: DC
Start: 2024-06-05 — End: 2024-06-09
  Administered 2024-06-05: 200 mg via INTRAVENOUS
  Filled 2024-06-05 (×2): qty 10

## 2024-06-05 MED ORDER — CHLORHEXIDINE GLUCONATE CLOTH 2 % EX PADS: 6.0000 | MEDICATED_PAD | Freq: Every day | CUTANEOUS | Status: DC

## 2024-06-05 NOTE — Progress Notes (Signed)
 Inpatient Rehabilitation Center Individual Statement of Services  Patient Name:  KYANNE RIALS  Date:  06/05/2024  Welcome to the Inpatient Rehabilitation Center.  Our goal is to provide you with an individualized program based on your diagnosis and situation, designed to meet your specific needs.  With this comprehensive rehabilitation program, you will be expected to participate in at least 3 hours of rehabilitation therapies Monday-Friday, with modified therapy programming on the weekends.  Your rehabilitation program will include the following services:  Physical Therapy (PT), Occupational Therapy (OT), 24 hour per day rehabilitation nursing, Therapeutic Recreaction (TR), Neuropsychology, Care Coordinator, Rehabilitation Medicine, Nutrition Services, and Pharmacy Services  Weekly team conferences will be held on Wednesday to discuss your progress.  Your Inpatient Rehabilitation Care Coordinator will talk with you frequently to get your input and to update you on team discussions.  Team conferences with you and your family in attendance may also be held.  Expected length of stay: 5-7 days  Overall anticipated outcome: Independent level  Depending on your progress and recovery, your program may change. Your Inpatient Rehabilitation Care Coordinator will coordinate services and will keep you informed of any changes. Your Inpatient Rehabilitation Care Coordinator's name and contact numbers are listed  below.  The following services may also be recommended but are not provided by the Inpatient Rehabilitation Center:  Driving Evaluations Home Health Rehabiltiation Services Outpatient Rehabilitation Services Vocational Rehabilitation   Arrangements will be made to provide these services after discharge if needed.  Arrangements include referral to agencies that provide these services.  Your insurance has been verified to be:  [employer] Your primary doctor is:  Jamee Antonio Meth  Pertinent  information will be shared with your doctor and your insurance company.  Inpatient Rehabilitation Care Coordinator:  Rhoda Clement, KEN [phone] or ELIGAH BASQUES  Information discussed with and copy given to patient by: Clement Asberry MATSU, 06/05/2024, 9:36 AM

## 2024-06-05 NOTE — Progress Notes (Signed)
 PROGRESS NOTE   Subjective/Complaints:  Abd fullness , vague discomfort mainly in the evening, no N/V, has been moving bowels, no heartburn, no hx ulcer   ROS- - CP, SOB, denies abd pain  Objective:   No results found. Recent Labs    06/03/24 0845 06/04/24 0250  WBC 7.8 7.4  HGB 7.4* 7.2*  HCT 22.3* 21.9*  PLT 379 352   Recent Labs    06/03/24 0845 06/04/24 0250  NA 138 137  K 4.1 4.2  CL 100 100  CO2 20* 18*  GLUCOSE 216* 93  BUN 49* 56*  CREATININE 9.24* 9.75*  CALCIUM  8.3* 8.7*    Intake/Output Summary (Last 24 hours) at 06/05/2024 0753 Last data filed at 06/04/2024 1757 Gross per 24 hour  Intake --  Output 350 ml  Net -350 ml        Physical Exam: Vital Signs Blood pressure 129/84, pulse 95, temperature 98.8 F (37.1 C), temperature source Oral, resp. rate 18, height 5' 5 (1.651 m), weight 87 kg, SpO2 98%.  General: No acute distress Mood and affect are appropriate Heart: Regular rate and rhythm no rubs murmurs or extra sounds Lungs: Clear to auscultation, breathing unlabored, no rales or wheezes Abdomen: Positive bowel sounds, soft nontender to palpation, nondistended Extremities: No clubbing, cyanosis, or edema Skin: No evidence of breakdown, no evidence of rash, Right upper chest HD cath CDI , no erythema  Neurologic: Cranial nerves II through XII intact, motor strength is 5/5 in bilateral deltoid, bicep, tricep, grip, hip flexor, knee extensors, ankle dorsiflexor and plantar flexor Sensory exam reduced sensation to light touch left tip of middle finger Cerebellar exam normal finger to nose to finger as well as heel to shin in bilateral upper and lower extremities Musculoskeletal: Full range of motion in all 4 extremities. No joint swelling    Assessment/Plan: 1. Functional deficits which require 3+ hours per day of interdisciplinary therapy in a comprehensive inpatient rehab  setting. Physiatrist is providing close team supervision and 24 hour management of active medical problems listed below. Physiatrist and rehab team continue to assess barriers to discharge/monitor patient progress toward functional and medical goals  Care Tool:  Bathing              Bathing assist       Upper Body Dressing/Undressing Upper body dressing        Upper body assist      Lower Body Dressing/Undressing Lower body dressing            Lower body assist       Toileting Toileting    Toileting assist       Transfers Chair/bed transfer  Transfers assist           Locomotion Ambulation   Ambulation assist              Walk 10 feet activity   Assist           Walk 50 feet activity   Assist           Walk 150 feet activity   Assist           Walk 10 feet  on uneven surface  activity   Assist           Wheelchair     Assist               Wheelchair 50 feet with 2 turns activity    Assist            Wheelchair 150 feet activity     Assist          Blood pressure 129/84, pulse 95, temperature 98.8 F (37.1 C), temperature source Oral, resp. rate 18, height 5' 5 (1.651 m), weight 87 kg, SpO2 98%.  Medical Problem List and Plan: 1. Functional deficits- debility secondary to sepsis due to  pneumonia             -patient may shower             -ELOS/Goals: 8-12 days S             Admit to CIR   2.  Antithrombotics: -DVT/anticoagulation:  Pharmaceutical: Heparin              -antiplatelet therapy: Plavix  3. Pain Management: Tylenol prn.  4. Mood/Behavior/Sleep: LCSW to follow for evaluation and support.              -antipsychotic agents: N/A 5. Neuropsych/cognition: This patient is capable of making decisions on her own behalf. 6. Skin/Wound Care: Routine pressure measures. 7. Fluids/Electrolytes/Nutrition: Strict I/O. Daily weights.    8.   Septic shock due to multifocal  PNA: Has completed Unasyn-->doxycycline thru 10/21.             --diarrhea has resolved.    9.  T2DM poorly controlled: Hgb A1c-9.9. Monitor BS ac/hs and use SSI for elevated BS. Off metformin . Used 70/30 insulin  bid PTA.               --continue Insulin  glargine 22 units with 8 units novolog  tid ac  CBG (last 3)  Recent Labs    06/04/24 1611 06/04/24 2206 06/05/24 0629  GLUCAP 208* 113* 95  Controlled monitor   10. AKI due to septic shock: HD dependent at this time. Schedule dialysis at the end of the day to help with tolerance of therapy             --Strict I/O. Daily weights.              --continue Auyrxia for elevated phos.    11. Anemia of acute illness: Treated with Venofer and Aranesp. Initial Hgb at admit was 14gm, check stool OB --Hgb  improved from 6.8-->7.4- stable will check stool OB      Latest Ref Rng & Units 06/04/2024    2:50 AM 06/03/2024    8:45 AM 06/02/2024    3:29 AM  CBC  WBC 4.0 - 10.5 K/uL 7.4  7.8  6.6   Hemoglobin 12.0 - 15.0 g/dL 7.2  7.4  7.0   Hematocrit 36.0 - 46.0 % 21.9  22.3  21.3   Platelets 150 - 400 K/uL 352  379  382     12. CAD: Continue plavix . BB resumed on 10/29 due to sinus tachycardia. Tachy may be related to anemia   13. Abnormal LFTs: Likely due to shocked liver. Will recheck tomorrow.          LOS: 1 days A FACE TO FACE EVALUATION WAS PERFORMED  Prentice FORBES Compton 06/05/2024, 7:53 AM

## 2024-06-05 NOTE — Progress Notes (Signed)
 [facility] Progress Note    Assessment/ Plan:    AKI - in setting of DKA & ischemic ATN in setting of volume depletion and acute illness as well as hypotension. Baseline Cr ~1. UA with gluc, blood, and protein.  Negative nitrite and renal US  without obstruction. S/p CRRT via temp line from 10/14-10/16/25. Transitioned to HD on 10/18  Appreciate VIR placing the right IJ [facility] 10/27 and removing the temp cath HD on MWF sched but can transition to TTS to facilitate PT, can come up to [facility]. No signs of renal recovery at this junction however need STRICT I/O to look at her trajectory.  She actually had 800 cc of urine output 2 days ago, HD on Wed with NO UF. Will check chemistry panel today and tomorrow; trying to hold HD if possible to decrease risk of another renal insult.  She knows the hope is to liberalize her from dialysis but may take longer than expected.  Will continue to monitor for renal recovery.  I did give her a heads up that she may be leaving the hospital on dialysis and she is understanding of this.  Avoid nephrotoxic medications including NSAIDs and iodinated intravenous contrast exposure unless the latter is absolutely indicated.  Preferred narcotic agents for pain control are hydromorphone, fentanyl , and methadone. Morphine should not be used. Avoid Baclofen and avoid oral sodium phosphate and magnesium citrate based laxatives / bowel preps. Continue strict Input and Output monitoring. Will monitor the patient closely with you and intervene or adjust therapy as indicated by changes in clinical status/labs   Severe sepsis with septic shock due to RML pneumonia -s/p doxycycline.  Per primary service Hematuria: UA pending (collect when able), renal u/s 10/21 unrevealing, w/u per primary service Acute hypoxic respiratory failure - improved  Acute NSTEMI - on plavix  and being followed by Cardiology. Hyponatremia - due to DKA and AKI, will attempt to manage with HD Hypokalemia  - due to DKA. resolved DKA - off insulin  drip per primary service Abnormal LFT's - likely shock liver.  Per primary Thrombocytopenia - due to acute critical illness--resolved AGMA - due to #1 and #7.  Metformin  stopped.  Improved with renal replacement therapy  Anemia - likely due to critical illness and AKI.  She will not accept blood transfusions ([employer]).  ESA dose increased for 10/25.  Receiving IV iron since she is off antibiotics.  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 Will d/w RD. Will recheck phos on Fr. Severe protein malnutrition - alb low.  protein supplements per RD.  In CIR and tired after the session this AM; will start CLIP process for AKI if not recovered when she's closer to d/c.[facility]   Subjective:   She reports good UOP; denies fever, chills, nausea, shortness of breath.  Tolerated HD yest with no UF. Spouse bedside updated.    Objective:   BP 129/84 (BP Location: Right Arm)   Pulse 95   Temp 98.8 F (37.1 C) (Oral)   Resp 18   Ht 5' 5 (1.651 m)   Wt 87 kg   LMP  (LMP Unknown)   SpO2 98%   BMI 31.92 kg/m   Intake/Output Summary (Last 24 hours) at 06/05/2024 1137 Last data filed at 06/05/2024 1125 Gross per 24 hour  Intake 200 ml  Output 550 ml  Net -350 ml    Weight change:  Physical Exam: Gen: NAD, laying flat in bed CVS: RRR Resp: CTA B/L, normal wob on RA Jai:dnqu, nt/nd Ext: no edema Neuro: awake, alert Dialysis access: RIJ tunneled HD catheter c/d/i  Imaging: No results found.    Labs: BMET Recent Labs  Lab 05/30/24 0527 05/31/24 0538 06/01/24 0821 06/02/24 0329 06/03/24 0845 06/04/24 0250  NA 134* 136 134* 137 138 137  K 4.1 4.2 4.0 4.1 4.1 4.2  CL 95* 98 95* 98 100 100  CO2 24 22 24  21* 20* 18*  GLUCOSE 140* 134* 155* 114* 216* 93  BUN 49* 65* 30* 39* 49* 56*  CREATININE 5.82* 8.09* 5.65* 7.27* 9.24* 9.75*  CALCIUM   7.7* 8.1* 7.9* 8.2* 8.3* 8.7*  PHOS 6.8* 9.7* 6.5* 8.8* 10.1* 10.1*   CBC Recent Labs  Lab 06/01/24 0821 06/02/24 0329 06/03/24 0845 06/04/24 0250  WBC 6.8 6.6 7.8 7.4  NEUTROABS 4.9 4.5 6.3 5.5  HGB 6.8* 7.0* 7.4* 7.2*  HCT 20.7* 21.3* 22.3* 21.9*  MCV 75.0* 74.7* 75.1* 76.6*  PLT 336 382 379 352    Medications:     (feeding supplement) PROSource Plus  30 mL Oral BID BM   Chlorhexidine Gluconate Cloth  6 each Topical BID   clopidogrel   75 mg Oral Daily   [START ON 06/08/2024] darbepoetin (ARANESP) injection - DIALYSIS  150 mcg Subcutaneous Q Sun-1800   ezetimibe   10 mg Oral Daily   feeding supplement (NEPRO CARB STEADY)  237 mL Oral Daily   ferric citrate  420 mg Oral TID WC   Gerhardt's butt cream   Topical Daily   heparin   5,000 Units Subcutaneous Q8H   insulin  aspart  0-5 Units Subcutaneous QHS   insulin  aspart  0-9 Units Subcutaneous TID WC   insulin  aspart  8 Units Subcutaneous TID WC   insulin  glargine-yfgn  22 Units Subcutaneous Daily   metoprolol  tartrate  50 mg Oral BID   multivitamin  1 tablet Oral QHS

## 2024-06-05 NOTE — Progress Notes (Signed)
 Initial Nutrition Assessment  DOCUMENTATION CODES:   Non-severe (moderate) malnutrition in context of acute illness/injury  INTERVENTION:  Provided patient with handout Phosphorus Content of Foods. Recommend phos-binder. Increase Nepro to BID. Reduce ProSource to once daily - patient dislikes. Continue renal/carb-modified diet. Continue renal multivitamin PO daily.  NUTRITION DIAGNOSIS:   Moderate Malnutrition related to acute illness as evidenced by energy intake < 75% for > 7 days, percent weight loss, mild muscle depletion.   GOAL:   Patient will meet greater than or equal to 90% of their needs   MONITOR:   PO intake, Supplement acceptance, Weight trends, Labs  REASON FOR ASSESSMENT:   Consult Diet education  ASSESSMENT:   PMH significant for DM2, CAD, HTN, dyslipidemia, admitted 05/18/24 for PNA with septic shock, worsening hypoxia, NSTEMI, DKA and AKI requiring HD. Now admitted to rehab.  Nephrology to monitor labs and attempt to hold off HD if possible. Visited the patient who states her appetite is improving. She tells me she is eating although a food recount today revealed she only had a bowl of oatmeal for breakfast and a bowl of vegetable soup for lunch. She is drinking one Nepro daily and is amenable to increasing that to BID. She dislikes the ProSource but is drinking one a day. Her UBW is 235 lbs which she weighed a month ago. She typically eats well at home with a typical day consisting of breakfast with eggs, bacon, pancakes and fruit, a sandwich or salad for lunch and chicken/pork with veggies for dinner. Encouraged patient to order more substantial meals - she tells me she has ordered bbq chicken for dinner tonight.   Scheduled Meds:  (feeding supplement) PROSource Plus  30 mL Oral BID BM   Chlorhexidine Gluconate Cloth  6 each Topical BID   clopidogrel   75 mg Oral Daily   [START ON 06/08/2024] darbepoetin (ARANESP) injection - DIALYSIS  150 mcg  Subcutaneous Q Sun-1800   ezetimibe   10 mg Oral Daily   feeding supplement (NEPRO CARB STEADY)  237 mL Oral Daily   ferric citrate  420 mg Oral TID WC   Gerhardt's butt cream   Topical Daily   heparin   5,000 Units Subcutaneous Q8H   insulin  aspart  0-5 Units Subcutaneous QHS   insulin  aspart  0-9 Units Subcutaneous TID WC   insulin  aspart  8 Units Subcutaneous TID WC   insulin  glargine-yfgn  22 Units Subcutaneous Daily   metoprolol  tartrate  50 mg Oral BID   multivitamin  1 tablet Oral QHS   Continuous Infusions:  iron sucrose 200 mg (06/05/24 1402)   Labs:     Latest Ref Rng & Units 06/04/2024    2:50 AM 06/03/2024    8:45 AM 06/02/2024    3:29 AM  CMP  Glucose 70 - 99 mg/dL 93  783  885   BUN 6 - 20 mg/dL 56  49  39   Creatinine 0.44 - 1.00 mg/dL 0.24  0.75  2.72   Sodium 135 - 145 mmol/L 137  138  137   Potassium 3.5 - 5.1 mmol/L 4.2  4.1  4.1   Chloride 98 - 111 mmol/L 100  100  98   CO2 22 - 32 mmol/L 18  20  21    Calcium  8.9 - 10.3 mg/dL 8.7  8.3  8.2       I/O: -350 mL since admit  Meal Intake: 25-50%   NUTRITION - FOCUSED PHYSICAL EXAM:  Flowsheet Row Most Recent Value  Orbital Region No depletion  Upper Arm Region No depletion  Thoracic and Lumbar Region No depletion  Buccal Region No depletion  Temple Region No depletion  Clavicle Bone Region No depletion  Clavicle and Acromion Bone Region Mild depletion  Scapular Bone Region Unable to assess  [due to body habitus]  Dorsal Hand No depletion  Patellar Region Mild depletion  Anterior Thigh Region Unable to assess  Posterior Calf Region Mild depletion  Edema (RD Assessment) Mild  [generalized non-pitting]  Hair Reviewed  Eyes Reviewed  Mouth Reviewed  Skin Reviewed  Nails Reviewed    Diet Order             Diet renal/carb modified with fluid restriction Fluid restriction: Other (see comments); Room service appropriate? Yes; Fluid consistency: Thin  Diet effective now                  ProSource BID Nepro QD  EDUCATION NEEDS:   Education needs have been addressed  Skin:  Skin Assessment: Reviewed RN Assessment  Last BM:  10/28 type 6  Height:   Ht Readings from Last 1 Encounters:  06/04/24 5' 5 (1.651 m)    Weight:   Estimated dry weight: 85.3 Kg 6 Kg (7%) loss in 3 months per records 20 Kg (19%) loss in 1 month from UBW per patient  Ideal Body Weight:  57 kg  BMI:  Body mass index is 31.92 kg/m.  Estimated Nutritional Needs:   Kcal:  1700-1900  Protein:  95-115  Fluid:  1 L + UOP    Danielle Ruth, MS, RDN, LDN [place]. [facility] See AMION for contact information

## 2024-06-05 NOTE — Plan of Care (Signed)
  Problem: RH Balance Goal: LTG Patient will maintain dynamic standing with ADLs (OT) Description: LTG:  Patient will maintain dynamic standing balance with assist during activities of daily living (OT)  Flowsheets (Taken 06/05/2024 1244) LTG: Pt will maintain dynamic standing balance during ADLs with: Independent   Problem: RH Bathing Goal: LTG Patient will bathe all body parts with assist levels (OT) Description: LTG: Patient will bathe all body parts with assist levels (OT) Flowsheets (Taken 06/05/2024 1244) LTG: Pt will perform bathing with assistance level/cueing: Independent with assistive device    Problem: RH Dressing Goal: LTG Patient will perform upper body dressing (OT) Description: LTG Patient will perform upper body dressing with assist, with/without cues (OT). Flowsheets (Taken 06/05/2024 1244) LTG: Pt will perform upper body dressing with assistance level of: Independent Goal: LTG Patient will perform lower body dressing w/assist (OT) Description: LTG: Patient will perform lower body dressing with assist, with/without cues in positioning using equipment (OT) Flowsheets (Taken 06/05/2024 1244) LTG: Pt will perform lower body dressing with assistance level of: Independent   Problem: RH Toileting Goal: LTG Patient will perform toileting task (3/3 steps) with assistance level (OT) Description: LTG: Patient will perform toileting task (3/3 steps) with assistance level (OT)  Flowsheets (Taken 06/05/2024 1244) LTG: Pt will perform toileting task (3/3 steps) with assistance level: Independent   Problem: RH Simple Meal Prep Goal: LTG Patient will perform simple meal prep w/assist (OT) Description: LTG: Patient will perform simple meal prep with assistance, with/without cues (OT). Flowsheets (Taken 06/05/2024 1244) LTG: Pt will perform simple meal prep with assistance level of: Independent   Problem: RH Toilet Transfers Goal: LTG Patient will perform toilet transfers w/assist  (OT) Description: LTG: Patient will perform toilet transfers with assist, with/without cues using equipment (OT) Flowsheets (Taken 06/05/2024 1244) LTG: Pt will perform toilet transfers with assistance level of: Independent   Problem: RH Tub/Shower Transfers Goal: LTG Patient will perform tub/shower transfers w/assist (OT) Description: LTG: Patient will perform tub/shower transfers with assist, with/without cues using equipment (OT) Flowsheets (Taken 06/05/2024 1244) LTG: Pt will perform tub/shower stall transfers with assistance level of: Independent with assistive device LTG: Pt will perform tub/shower transfers from: Tub/shower combination

## 2024-06-05 NOTE — Evaluation (Signed)
 Occupational Therapy Assessment and Plan  Patient Details  Name: Danielle Flores MRN: [id number] Date of Birth: 27-Mar-1966  OT Diagnosis: muscle weakness (generalized) and low endurance Rehab Potential: Rehab Potential (ACUTE ONLY): Good ELOS: 7-9 days   Today's Date: 06/05/2024 OT Individual Time: 9069-8984 OT Individual Time Calculation (min): 45 min  and Today's Date: 06/05/2024 OT Missed Time: 15 Minutes Missed Time Reason: Patient fatigue    Pt seen for initial evaluation and ADL training with a focus on activity tolerance. Pt received in bed with spouse present and pt stated she was too tired to do anything due to putting in a lot of effort in PT.  Spent first part of session in interview with pt and spouse relaying the amount of A she needed today.  Discussed energy conservation,  adjustment to dialysis,  home set up,  levels of endurance for home needs.   Pt did agree to get out of bed to work on transfers, standing,  arm exercises with red band in both seated and standing.   Her HR did not elevate above 102.   Pt was very tired with basic exercises and multiple rest breaks.  Pt stated she could not do more activity.  Pt returned to bed.   Reviewed role of OT, discussed POC and pt's goals, and ELOS. Pt resting in bed with all needs met .   Because pt is supervision with ambulation and toileting, pt's spouse Danielle Flores cleared to A pt on safety plan.    Hospital Problem: Principal Problem:   Debility   Past Medical History:  Past Medical History:  Diagnosis Date   AKI (acute kidney injury) 05/18/2024   CAD (coronary artery disease) 2021   DKA (diabetic ketoacidosis) (HCC) 05/18/2024   DM2 (diabetes mellitus, type 2) (HCC)    Hyperglycemia    Hyperlipidemia    Hypertension    Patient requiring acute dialysis 05/20/2024   Sepsis (HCC) 05/18/2024   Past Surgical History:  Past Surgical History:  Procedure Laterality Date   CESAREAN SECTION     CORONARY STENT INTERVENTION N/A  09/16/2018   Procedure: CORONARY STENT INTERVENTION;  Surgeon: Dann Candyce RAMAN, MD;  Location: [facility] INVASIVE CV LAB;  Service: Cardiovascular;  Laterality: N/A;   IR TUNNELED CENTRAL VENOUS CATH PLC W IMG  06/02/2024   LEFT HEART CATH AND CORONARY ANGIOGRAPHY N/A 09/16/2018   Procedure: LEFT HEART CATH AND CORONARY ANGIOGRAPHY;  Surgeon: Dann Candyce RAMAN, MD;  Location: [facility] INVASIVE CV LAB;  Service: Cardiovascular;  Laterality: N/A;    Assessment & Plan Clinical Impression:  Danielle Flores is a 58 year old female with history of CAD, T2DM, HTN, CAD s/p RCA stent who was admitted on 05/18/24 with URI type symptoms,  N/V and generalized weakness. She was found to have NSTEMI, DKA, was febrile with T-102.6, tachycardic and was SOB. She was started on IV insulin , treated with diuresis and broad spectrum antibiotics for RML PNA. Dr. Wang/cardiology felt that elevated trops secondary to type II MI in setting of infection and tachycardia. She had worsening of hypoxia with decrease in UOP requiring BIPAP and diuretics. 2 D echo done revealing hyperdynamic EF > 75% with severe LVH. Cardiology felt elevated trops due to demand ischemia and no further cardiac evaluation needed.    Dr. Aileen consulted for input on  AKi felt to be in setting of septic shock with ATN. CRRT initiated with monitoring for renal recovery. She continued to have poor UOP and has been transitioned to  HD.  Electrolyte abnormalities due to DKA and AKI have improved. AGMA felt to in part due to metformin  which was d/c and anemia of critical illness treated with ESA as patient decline blood transfusion.  PT/OT consulted as patient noted to be deconditioned with HR up to 140's with activity. She requires CGA to supervision with mobility. and CIR recommended due to functional decline. Her sister is at bedside.      Patient transferred to CIR on 06/04/2024 .    Patient currently requires supervision with basic self-care skills  secondary to decreased cardiorespiratoy endurance and activity tolerance.  Prior to hospitalization, patient was fully independent.  Patient will benefit from skilled intervention to increase independence with basic self-care skills and increase level of independence with iADL prior to discharge home with care partner.  Anticipate patient will require intermittent supervision and follow up home health.  OT - End of Session Activity Tolerance: Tolerates < 10 min activity with changes in vital signs Endurance Deficit: Yes Endurance Deficit Description: HR rises to 135 with mobility, needs extended seated rests breaks OT Assessment Rehab Potential (ACUTE ONLY): Good OT Barriers to Discharge: None OT Patient demonstrates impairments in the following area(s): Endurance OT Basic ADL's Functional Problem(s): Dressing;Toileting;Bathing OT Advanced ADL's Functional Problem(s): Simple Meal Preparation OT Transfers Functional Problem(s): Toilet;Tub/Shower OT Additional Impairment(s): None OT Plan OT Intensity: Minimum of 1-2 x/day, 45 to 90 minutes OT Frequency: Total of 15 hours over 7 days of combined therapies OT Duration/Estimated Length of Stay: 7-9 days OT Treatment/Interventions: Functional mobility training;DME/adaptive equipment instruction;Discharge planning;Patient/family education;Psychosocial support;Therapeutic Activities;Therapeutic Exercise OT Self Feeding Anticipated Outcome(s): independent OT Basic Self-Care Anticipated Outcome(s): independent dressing, mod ind bathing OT Toileting Anticipated Outcome(s): independent OT Bathroom Transfers Anticipated Outcome(s): independent toilet, mod ind tub OT Recommendation Recommendations for Other Services: None Patient destination: Home Follow Up Recommendations: Home health OT Equipment Recommended: Tub/shower seat   OT Evaluation Precautions/Restrictions   Fall, watch HR   Pain  No c/o pain Home Living/Prior Functioning Home  Living Family/patient expects to be discharged to:: Private residence Living Arrangements: Spouse/significant other Available Help at Discharge: Family, Available 24 hours/day, Friend(s) Type of Home: House Home Access: Stairs to enter Secretary/administrator of Steps: 2 Entrance Stairs-Rails: None Home Layout: One level Bathroom Shower/Tub: Tub/shower unit, Engineer, Building Services: Standard  Lives With: Spouse Prior Function Level of Independence: Independent with basic ADLs, Independent with transfers, Independent with gait  Able to Take Stairs?: Yes Driving: Yes Vocation: Full time employment Leisure: Hobbies-no Vision Baseline Vision/History: 0 No visual deficits Ability to See in Adequate Light: 0 Adequate Patient Visual Report: No change from baseline Vision Assessment?: No apparent visual deficits Perception  Perception: Within Functional Limits Praxis Praxis: WFL Cognition Cognition Overall Cognitive Status: Within Functional Limits for tasks assessed Arousal/Alertness: Awake/alert Memory: Appears intact Brief Interview for Mental Status (BIMS) Repetition of Three Words (First Attempt): 3 Temporal Orientation: Year: Correct Temporal Orientation: Month: Accurate within 5 days Temporal Orientation: Day: Correct Recall: Sock: Yes, no cue required Recall: Blue: Yes, no cue required Recall: Bed: Yes, no cue required BIMS Summary Score: 15 Sensation Sensation Light Touch: Appears Intact Hot/Cold: Appears Intact Proprioception: Appears Intact Stereognosis: Appears Intact Coordination Gross Motor Movements are Fluid and Coordinated: Yes Motor  Motor Motor: Other (comment) Motor - Skilled Clinical Observations: General debility and weakness/deconditioning  Trunk/Postural Assessment  Cervical Assessment Cervical Assessment: Within Functional Limits Thoracic Assessment Thoracic Assessment: Within Functional Limits Lumbar Assessment Lumbar Assessment:  Within Functional Limits Postural Control Postural  Control: Within Functional Limits  Balance Balance Balance Assessed: Yes Static Sitting Balance Static Sitting - Balance Support: Feet supported;No upper extremity supported Static Sitting - Level of Assistance: 7: Independent Dynamic Sitting Balance Dynamic Sitting - Balance Support: Feet supported;No upper extremity supported Dynamic Sitting - Level of Assistance: 5: Stand by assistance Static Standing Balance Static Standing - Balance Support: No upper extremity supported Static Standing - Level of Assistance: 7: Independent Dynamic Standing Balance Dynamic Standing - Balance Support: No upper extremity supported;During functional activity Dynamic Standing - Level of Assistance: 5: Stand by assistance Extremity/Trunk Assessment RUE Assessment RUE Assessment: Within Functional Limits LUE Assessment LUE Assessment: Within Functional Limits  Care Tool Care Tool Self Care Eating   Eating Assist Level: Set up assist    Oral Care    Oral Care Assist Level: Set up assist    Bathing   Body parts bathed by patient: Right arm;Left arm;Chest;Abdomen;Front perineal area;Buttocks;Face;Left lower leg;Right lower leg;Left upper leg;Right upper leg     Assist Level: Supervision/Verbal cueing    Upper Body Dressing(including orthotics)   What is the patient wearing?: Pull over shirt   Assist Level: Supervision/Verbal cueing    Lower Body Dressing (excluding footwear)   What is the patient wearing?: Pants;Underwear/pull up Assist for lower body dressing: Supervision/Verbal cueing    Putting on/Taking off footwear   What is the patient wearing?: Non-skid slipper socks Assist for footwear: Supervision/Verbal cueing       Care Tool Toileting Toileting activity   Assist for toileting: Supervision/Verbal cueing     Care Tool Bed Mobility Roll left and right activity   Roll left and right assist level: Independent    Sit to  lying activity   Sit to lying assist level: Independent    Lying to sitting on side of bed activity   Lying to sitting on side of bed assist level: the ability to move from lying on the back to sitting on the side of the bed with no back support.: Independent     Care Tool Transfers Sit to stand transfer   Sit to stand assist level: Independent    Chair/bed transfer   Chair/bed transfer assist level: Independent     Toilet transfer   Assist Level: Independent     Care Tool Cognition  Expression of Ideas and Wants Expression of Ideas and Wants: 4. Without difficulty (complex and basic) - expresses complex messages without difficulty and with speech that is clear and easy to understand  Understanding Verbal and Non-Verbal Content Understanding Verbal and Non-Verbal Content: 4. Understands (complex and basic) - clear comprehension without cues or repetitions   Memory/Recall Ability Memory/Recall Ability : Current season;Location of own room;Staff names and faces;That he or she is in a hospital/hospital unit   Refer to Care Plan for Long Term Goals  SHORT TERM GOAL WEEK 1 OT Short Term Goal 1 (Week 1): STGs = LTGs  Recommendations for other services: None    Skilled Therapeutic Intervention ADL ADL ADL Comments: Pt and spouse report she did all self care with S. Pt already bathed and dressed this am. Mobility  Bed Mobility Bed Mobility: Rolling Right;Rolling Left;Supine to Sit;Sit to Supine Rolling Right: Independent Rolling Left: Independent Supine to Sit: Independent Sit to Supine: Independent Transfers Sit to Stand: Supervision/Verbal cueing Stand to Sit: Supervision/Verbal cueing   Discharge Criteria: Patient will be discharged from OT if patient refuses treatment 3 consecutive times without medical reason, if treatment goals not met, if there is  a change in medical status, if patient makes no progress towards goals or if patient is discharged from hospital.  The  above assessment, treatment plan, treatment alternatives and goals were discussed and mutually agreed upon: by patient and by family  Mickenzie Stolar 06/05/2024, 12:37 PM

## 2024-06-05 NOTE — Plan of Care (Signed)
  Problem: RH Balance Goal: LTG Patient will maintain dynamic standing balance (PT) Description: LTG:  Patient will maintain dynamic standing balance with assistance during mobility activities (PT) Flowsheets (Taken 06/05/2024 1234) LTG: Pt will maintain dynamic standing balance during mobility activities with:: Independent with assistive device    Problem: Sit to Stand Goal: LTG:  Patient will perform sit to stand with assistance level (PT) Description: LTG:  Patient will perform sit to stand with assistance level (PT) Flowsheets (Taken 06/05/2024 1234) LTG: PT will perform sit to stand in preparation for functional mobility with assistance level: Independent with assistive device   Problem: RH Bed Mobility Goal: LTG Patient will perform bed mobility with assist (PT) Description: LTG: Patient will perform bed mobility with assistance, with/without cues (PT). Flowsheets (Taken 06/05/2024 1234) LTG: Pt will perform bed mobility with assistance level of: Independent with assistive device    Problem: RH Bed to Chair Transfers Goal: LTG Patient will perform bed/chair transfers w/assist (PT) Description: LTG: Patient will perform bed to chair transfers with assistance (PT). Flowsheets (Taken 06/05/2024 1234) LTG: Pt will perform Bed to Chair Transfers with assistance level: Independent with assistive device    Problem: RH Car Transfers Goal: LTG Patient will perform car transfers with assist (PT) Description: LTG: Patient will perform car transfers with assistance (PT). Flowsheets (Taken 06/05/2024 1234) LTG: Pt will perform car transfers with assist:: Supervision/Verbal cueing   Problem: RH Ambulation Goal: LTG Patient will ambulate in controlled environment (PT) Description: LTG: Patient will ambulate in a controlled environment, # of feet with assistance (PT). Flowsheets (Taken 06/05/2024 1234) LTG: Pt will ambulate in controlled environ  assist needed:: Independent with assistive  device LTG: Ambulation distance in controlled environment: 150' Goal: LTG Patient will ambulate in home environment (PT) Description: LTG: Patient will ambulate in home environment, # of feet with assistance (PT). Flowsheets (Taken 06/05/2024 1234) LTG: Pt will ambulate in home environ  assist needed:: Independent with assistive device LTG: Ambulation distance in home environment: 50'   Problem: RH Stairs Goal: LTG Patient will ambulate up and down stairs w/assist (PT) Description: LTG: Patient will ambulate up and down # of stairs with assistance (PT) Flowsheets (Taken 06/05/2024 1234) LTG: Pt will ambulate up/down stairs assist needed:: Independent with assistive device LTG: Pt will  ambulate up and down number of stairs: at least 4 steps with LRAD

## 2024-06-05 NOTE — Progress Notes (Addendum)
 Inpatient Rehabilitation Care Coordinator Assessment and Plan Patient Details  Name: Danielle Flores MRN: [id number] Date of Birth: October 28, 1965  Today's Date: 06/05/2024  Hospital Problems: Principal Problem:   Debility  Past Medical History:  Past Medical History:  Diagnosis Date   AKI (acute kidney injury) 05/18/2024   CAD (coronary artery disease) 2021   DKA (diabetic ketoacidosis) (HCC) 05/18/2024   DM2 (diabetes mellitus, type 2) (HCC)    Hyperglycemia    Hyperlipidemia    Hypertension    Patient requiring acute dialysis 05/20/2024   Sepsis (HCC) 05/18/2024   Past Surgical History:  Past Surgical History:  Procedure Laterality Date   CESAREAN SECTION     CORONARY STENT INTERVENTION N/A 09/16/2018   Procedure: CORONARY STENT INTERVENTION;  Surgeon: Dann Candyce RAMAN, MD;  Location: [facility] INVASIVE CV LAB;  Service: Cardiovascular;  Laterality: N/A;   IR TUNNELED CENTRAL VENOUS CATH PLC W IMG  06/02/2024   LEFT HEART CATH AND CORONARY ANGIOGRAPHY N/A 09/16/2018   Procedure: LEFT HEART CATH AND CORONARY ANGIOGRAPHY;  Surgeon: Dann Candyce RAMAN, MD;  Location: [facility] INVASIVE CV LAB;  Service: Cardiovascular;  Laterality: N/A;   Social History:  reports that she has never smoked. She has never used smokeless tobacco. She reports current alcohol use. She reports that she does not use drugs.  Family / Support Systems Marital Status: Married Patient Roles: Spouse, Other (Comment) (employee) Spouse/Significant Other: Camellia [phone] Other Supports: Friends Anticipated Caregiver: Camellia Ability/Limitations of Caregiver: Husband is semi-retired and will be with T, TH Sat he will be working the other days and others can check on pt while he works Medical Laboratory Scientific Officer: Evenings only Family Dynamics: Close with friends and husband is very supportive and will be her main caregiver. He is semi-retired so will be working some days and pt will need to be mod/i to go home and be safe  alone  Social History Preferred language: English Religion: Jehovah's Witness Cultural Background: NA Education: Teacher, Early Years/pre - How often do you need to have someone help you when you read instructions, pamphlets, or other written material from your doctor or pharmacy?: Never Writes: Yes Employment Status: Employed Name of Employer: [employer] Return to Work Plans: Will need to recover from her illness and will be going to OP-HD at discharge Legal History/Current Legal Issues: NA Guardian/Conservator: None-according to MD pt is capable of making her own decisions while  here. Husband plans to be here daily to provide support   Abuse/Neglect Abuse/Neglect Assessment Can Be Completed: Yes Physical Abuse: Denies Verbal Abuse: Denies Sexual Abuse: Denies Exploitation of patient/patient's resources: Denies Self-Neglect: Denies  Patient response to: Social Isolation - How often do you feel lonely or isolated from those around you?: Never  Emotional Status Pt's affect, behavior and adjustment status: Pt is exhausted and trying to sleep after first therapy session. She was very active and independent prior to admission and worked two jobs Recent Psychosocial Issues: has other health issues-MI 2022 wants her FLU and Pnemonia shot while here Psychiatric History: No history-with young age would benefit from seeing neuro-psych while here. Substance Abuse History: NA  Patient / Family Perceptions, Expectations & Goals Pt/Family understanding of illness & functional limitations: Pt and husband can explain her illness and are hopeful she is over the worse part now it is time to recover and build her strength and get back home. 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  Husband states:  I know she will do well it will take time.  Community Resources [employer]: None Premorbid Home Care/DME Agencies: None Transportation available at discharge: self and husband Is the patient able to respond to transportation needs?: Yes In the past 12 months, has lack of transportation kept you from medical appointments or from getting medications?: No In the past 12 months, has lack of transportation kept you from meetings, work, or from getting things needed for daily living?: No Resource referrals recommended: Neuropsychology  Discharge Planning Living Arrangements: Spouse/significant other Support Systems: Spouse/significant other, Friends/neighbors, Psychologist, Clinical community Type of Residence: Private residence Insurance Resources: Media Planner (specify) ([employer]) Surveyor, Quantity Resources: Employment, Garment/textile Technologist Screen Referred: No Living Expenses: Banker Management: Patient, Spouse Does the patient have any problems obtaining your medications?: No Home Management: both Patient/Family Preliminary Plans: Return home with husband who is semi-retired and will be with T, TH Sat to take to OP-HD. Pt will need to be mod/i to be able to be home safe alone while he is working. Aware being evaluated and goals being set for stay here Care Coordinator Barriers to Discharge: Insurance for SNF coverage, Decreased caregiver support, Hemodialysis Care Coordinator Anticipated Follow Up Needs: HH/OP  Clinical Impression Pleasant pt who is falling asleep after first therapy session this am. Her husband is present and supportive and reports it will take time for her to recover from this. Aware being evaluated and goals being set for stay here will coordinate discharge needs and will ask neuro-psych to see while here. Pt wanted to let MD know she would like a flu and pneumonia shot prior to  discharge  Tannor Pyon, [place] MATSU 06/05/2024, 9:35 AM

## 2024-06-05 NOTE — Plan of Care (Signed)
  Problem: Consults Goal: RH GENERAL PATIENT EDUCATION Description: See Patient Education module for education specifics. Outcome: Progressing Goal: Nutrition Consult-if indicated Outcome: Progressing Goal: Diabetes Guidelines if Diabetic/Glucose > 140 Description: If diabetic or lab glucose is > 140 mg/dl - Initiate Diabetes/Hyperglycemia Guidelines & Document Interventions  Outcome: Progressing   Problem: RH BOWEL ELIMINATION Goal: RH STG MANAGE BOWEL WITH ASSISTANCE Description: STG Manage Bowel with supervision Assistance. Outcome: Progressing   Problem: RH SKIN INTEGRITY Goal: RH STG SKIN FREE OF INFECTION/BREAKDOWN Description: Manage skin free of infection with supervision assistance Outcome: Progressing   Problem: RH SAFETY Goal: RH STG ADHERE TO SAFETY PRECAUTIONS W/ASSISTANCE/DEVICE Description: STG Adhere to Safety Precautions With supervision Assistance/Device. Outcome: Progressing   Problem: RH PAIN MANAGEMENT Goal: RH STG PAIN MANAGED AT OR BELOW PT'S PAIN GOAL Description: <4 w/ prns Outcome: Progressing   Problem: RH KNOWLEDGE DEFICIT GENERAL Goal: RH STG INCREASE KNOWLEDGE OF SELF CARE AFTER HOSPITALIZATION Description: Manage increase knowledge of self care after hospitalization with supervision assistance from spouse using educational materials provided  Outcome: Progressing   Problem: Education: Goal: Ability to describe self-care measures that may prevent or decrease complications (Diabetes Survival Skills Education) will improve Outcome: Progressing Goal: Individualized Educational Video(s) Outcome: Progressing   Problem: Coping: Goal: Ability to adjust to condition or change in health will improve Outcome: Progressing   Problem: Fluid Volume: Goal: Ability to maintain a balanced intake and output will improve Outcome: Progressing   Problem: Health Behavior/Discharge Planning: Goal: Ability to identify and utilize available resources and  services will improve Outcome: Progressing Goal: Ability to manage health-related needs will improve Outcome: Progressing   Problem: Metabolic: Goal: Ability to maintain appropriate glucose levels will improve Outcome: Progressing   Problem: Nutritional: Goal: Maintenance of adequate nutrition will improve Outcome: Progressing Goal: Progress toward achieving an optimal weight will improve Outcome: Progressing   Problem: Skin Integrity: Goal: Risk for impaired skin integrity will decrease Outcome: Progressing   Problem: Tissue Perfusion: Goal: Adequacy of tissue perfusion will improve Outcome: Progressing

## 2024-06-05 NOTE — Progress Notes (Signed)
 Inpatient Rehabilitation  Patient information reviewed and entered into eRehab system by [facility]. Karen Kays., CCC/SLP, PPS Coordinator.  Information including medical coding, functional ability and quality indicators will be reviewed and updated through discharge.

## 2024-06-05 NOTE — Evaluation (Signed)
 Physical Therapy Assessment and Plan  Patient Details  Name: Danielle Flores MRN: [id number] Date of Birth: Dec 03, 1965  PT Diagnosis: Difficulty walking and Muscle weakness Rehab Potential: Excellent ELOS: 3-5 days   Today's Date: 06/05/2024 PT Individual Time: 0800-0910 PT Individual Time Calculation (min): 70 min    Hospital Problem: Principal Problem:   Debility   Past Medical History:  Past Medical History:  Diagnosis Date   AKI (acute kidney injury) 05/18/2024   CAD (coronary artery disease) 2021   DKA (diabetic ketoacidosis) (HCC) 05/18/2024   DM2 (diabetes mellitus, type 2) (HCC)    Hyperglycemia    Hyperlipidemia    Hypertension    Patient requiring acute dialysis 05/20/2024   Sepsis (HCC) 05/18/2024   Past Surgical History:  Past Surgical History:  Procedure Laterality Date   CESAREAN SECTION     CORONARY STENT INTERVENTION N/A 09/16/2018   Procedure: CORONARY STENT INTERVENTION;  Surgeon: Dann Candyce RAMAN, MD;  Location: [facility] INVASIVE CV LAB;  Service: Cardiovascular;  Laterality: N/A;   IR TUNNELED CENTRAL VENOUS CATH PLC W IMG  06/02/2024   LEFT HEART CATH AND CORONARY ANGIOGRAPHY N/A 09/16/2018   Procedure: LEFT HEART CATH AND CORONARY ANGIOGRAPHY;  Surgeon: Dann Candyce RAMAN, MD;  Location: [facility] INVASIVE CV LAB;  Service: Cardiovascular;  Laterality: N/A;    Assessment & Plan Clinical Impression: Patient is a 58 year old female with history of CAD, T2DM, HTN, CAD s/p RCA stent who was admitted on 05/18/24 with URI type symptoms,  N/V and generalized weakness. She was found to have NSTEMI, DKA, was febrile with T-102.6, tachycardic and was SOB. She was started on IV insulin , treated with diuresis and broad spectrum antibiotics for RML PNA. Dr. Wang/cardiology felt that elevated trops secondary to type II MI in setting of infection and tachycardia. She had worsening of hypoxia with decrease in UOP requiring BIPAP and diuretics. 2 D echo done revealing  hyperdynamic EF > 75% with severe LVH. Cardiology felt elevated trops due to demand ischemia and no further cardiac evaluation needed.    Dr. Aileen consulted for input on  AKi felt to be in setting of septic shock with ATN. CRRT initiated with monitoring for renal recovery. She continued to have poor UOP and has been transitioned to HD.  Electrolyte abnormalities due to DKA and AKI have improved. AGMA felt to in part due to metformin  which was d/c and anemia of critical illness treated with ESA as patient decline blood transfusion.  PT/OT consulted as patient noted to be deconditioned with HR up to 140's with activity. She requires CGA to supervision with mobility. and CIR recommended due to functional declinePatient transferred to CIR on 06/04/2024 .   Patient currently requires supervision with mobility secondary to muscle weakness and muscle joint tightness, decreased cardiorespiratoy endurance, and decreased standing balance and decreased balance strategies.  Prior to hospitalization, patient was independent  with mobility and lived with Spouse in a House home.  Home access is 2Stairs to enter.  Patient will benefit from skilled PT intervention to maximize safe functional mobility, minimize fall risk, and decrease caregiver burden for planned discharge home with intermittent assist.  Anticipate patient will benefit from follow up OP at discharge.  PT - End of Session Activity Tolerance: Tolerates < 10 min activity with changes in vital signs Endurance Deficit: Yes Endurance Deficit Description: HR rises to 135 with mobility, needs extended seated rests breaks PT Assessment Rehab Potential (ACUTE/IP ONLY): Excellent PT Barriers to Discharge: Decreased caregiver support;Lack  of/limited family support;Hemodialysis PT Patient demonstrates impairments in the following area(s): Balance;Endurance;Motor PT Transfers Functional Problem(s): Bed Mobility;Bed to Chair;Car;Furniture PT Locomotion  Functional Problem(s): Stairs;Ambulation PT Plan PT Intensity: Minimum of 1-2 x/day ,45 to 90 minutes PT Frequency: 5 out of 7 days PT Duration Estimated Length of Stay: 3-5 days PT Treatment/Interventions: Community Education Officer;Neuromuscular re-education;Psychosocial support;Stair training;UE/LE Strength taining/ROM;Wheelchair propulsion/positioning;Balance/vestibular training;Ambulation/gait training;Discharge planning;Pain management;Therapeutic Activities;UE/LE Coordination activities;Cognitive remediation/compensation;Disease management/prevention;Functional mobility training;Patient/family education;Therapeutic Exercise;Visual/perceptual remediation/compensation PT Transfers Anticipated Outcome(s): Mod I PT Locomotion Anticipated Outcome(s): Mod I PT Recommendation Recommendations for Other Services: None Follow Up Recommendations: Outpatient PT Patient destination: Home Equipment Recommended: To be determined   PT Evaluation Precautions/Restrictions Precautions Precautions: Fall Precaution/Restrictions Comments: Monitor HR; dialysis T/TH/S Restrictions Weight Bearing Restrictions Per Provider Order: No Pain Interference Pain Interference Pain Effect on Sleep: 0. Does not apply - I have not had any pain or hurting in the past 5 days Pain Interference with Therapy Activities: 0. Does not apply - I have not received rehabilitationtherapy in the past 5 days Pain Interference with Day-to-Day Activities: 1. Rarely or not at all Home Living/Prior Functioning Home Living Available Help at Discharge: Family;Available 24 hours/day;Friend(s) Type of Home: House Home Access: Stairs to enter [employer] of Steps: 2 Entrance Stairs-Rails: None Home Layout: One level Bathroom Shower/Tub: Forensic Scientist: Standard  Lives With: Spouse Prior Function Level of Independence: Independent with basic ADLs;Independent with  transfers;Independent with gait  Able to Take Stairs?: Yes Driving: Yes Vocation: Full time employment Leisure: Hobbies-no Vision/Perception  Vision - History Ability to See in Adequate Light: 0 Adequate Perception Perception: Within Functional Limits Praxis Praxis: WFL  Cognition Overall Cognitive Status: Within Functional Limits for tasks assessed Arousal/Alertness: Awake/alert Orientation Level: Oriented X4 Memory: Appears intact Sensation Sensation Light Touch: Appears Intact Hot/Cold: Appears Intact Proprioception: Appears Intact Stereognosis: Appears Intact Coordination Gross Motor Movements are Fluid and Coordinated: Yes Motor  Motor Motor: Other (comment) Motor - Skilled Clinical Observations: General debility and weakness/deconditioning   Trunk/Postural Assessment  Cervical Assessment Cervical Assessment: Within Functional Limits Thoracic Assessment Thoracic Assessment: Within Functional Limits Lumbar Assessment Lumbar Assessment: Within Functional Limits Postural Control Postural Control: Within Functional Limits  Balance Balance Balance Assessed: Yes Static Sitting Balance Static Sitting - Balance Support: Feet supported;No upper extremity supported Static Sitting - Level of Assistance: 7: Independent Dynamic Sitting Balance Dynamic Sitting - Balance Support: Feet supported;No upper extremity supported Dynamic Sitting - Level of Assistance: 5: Stand by assistance Static Standing Balance Static Standing - Balance Support: No upper extremity supported Static Standing - Level of Assistance: 7: Independent Dynamic Standing Balance Dynamic Standing - Balance Support: No upper extremity supported;During functional activity Dynamic Standing - Level of Assistance: 5: Stand by assistance Extremity Assessment      RLE Assessment RLE Assessment: Exceptions to [facility] General Strength Comments: Grossly 4/5 LLE Assessment LLE Assessment: Exceptions to  [facility] General Strength Comments: Grossly 4/5  Care Tool Care Tool Bed Mobility Roll left and right activity   Roll left and right assist level: Independent    Sit to lying activity   Sit to lying assist level: Independent    Lying to sitting on side of bed activity   Lying to sitting on side of bed assist level: the ability to move from lying on the back to sitting on the side of the bed with no back support.: Independent     Care Tool Transfers Sit to stand transfer   Sit to stand assist level: Independent  Chair/bed transfer   Chair/bed transfer assist level: Psychologist, Sport And Exercise transfer assist level: Independent      Care Tool Locomotion Ambulation   Assist level: Supervision/Verbal cueing Assistive device: No Device Max distance: 632'  Walk 10 feet activity   Assist level: Supervision/Verbal cueing Assistive device: No Device   Walk 50 feet with 2 turns activity   Assist level: Supervision/Verbal cueing Assistive device: No Device  Walk 150 feet activity   Assist level: Supervision/Verbal cueing Assistive device: No Device  Walk 10 feet on uneven surfaces activity Walk 10 feet on uneven surfaces activity did not occur: Safety/medical concerns (d/t fatigue)      Stairs   Assist level: Supervision/Verbal cueing Stairs assistive device: 2 hand rails Max number of stairs: 16  Walk up/down 1 step activity   Walk up/down 1 step (curb) assist level: Supervision/Verbal cueing Walk up/down 1 step or curb assistive device: 2 hand rails  Walk up/down 4 steps activity   Walk up/down 4 steps assist level: Supervision/Verbal cueing Walk up/down 4 steps assistive device: 2 hand rails  Walk up/down 12 steps activity   Walk up/down 12 steps assist level: Supervision/Verbal cueing Walk up/down 12 steps assistive device: 2 hand rails  Pick up small objects from floor Pick up small object from the floor (from standing position) activity did not occur:  Safety/medical concerns (d/t fatigue)      Wheelchair Is the patient using a wheelchair?: Yes Type of Wheelchair: Manual   Wheelchair assist level: Independent Max wheelchair distance: 150'  Wheel 50 feet with 2 turns activity   Assist Level: Independent  Wheel 150 feet activity   Assist Level: Independent    Refer to Care Plan for Long Term Goals  SHORT TERM GOAL WEEK 1 PT Short Term Goal 1 (Week 1): STG = LTG d/t ELOS  Recommendations for other services: None   Skilled Therapeutic Intervention Mobility Bed Mobility Bed Mobility: Rolling Right;Rolling Left;Supine to Sit;Sit to Supine Rolling Right: Independent Rolling Left: Independent Supine to Sit: Independent Sit to Supine: Independent Transfers Transfers: Sit to Stand;Stand Pivot Transfers;Stand to Sit Sit to Stand: Supervision/Verbal cueing Stand to Sit: Supervision/Verbal cueing Stand Pivot Transfers: Supervision/Verbal cueing Stand Pivot Transfer Details: Verbal cues for precautions/safety Transfer (Assistive device): None Locomotion  Gait Ambulation: Yes Gait Assistance: Supervision/Verbal cueing Gait Distance (Feet): 632 Feet ( ) Assistive device: None Gait Assistance Details: Verbal cues for precautions/safety Gait Gait: Yes Gait Pattern: Within Functional Limits Stairs / Additional Locomotion Stairs: Yes Stairs Assistance: Contact Guard/Touching assist Stair Management Technique: Two rails;Alternating pattern;Forwards Number of Stairs: 12 Height of Stairs: 6 Wheelchair Mobility Wheelchair Mobility: Yes Wheelchair Assistance: Independent with Scientist, Research (life Sciences): Both upper extremities Wheelchair Parts Management: Needs assistance Distance: 150'  Treatment Pt in bed to start - husband at bedside. Pt agreeable to therapy treatment and has no reports of pain. Pt oriented x4, no cognitive deficits noted.   Functional mobility completed as outlined above. Overall, mod I for  bed mobility, supervision for transfers, and supervision for gait without the use of an AD. She was also able to complete > 16 steps with supervision with 2 hand rails. Pt had no LOB or knee buckling observed during our session. She needed extended seated rest breaks throughout mobility training due to her fatigue. Her resting HR was around 103bpm and would increase to 135bpm with mobility. Her was 57' without a seated or standing rest break needed. She finished the session on  Nustep for x10 minutes at L1 resistance using BUE/BLE to challenge whole body strengthening and activity tolerance. Pt needing encouragement to continue.   Pt ended session in bed, needs met, husband at bedside.    Discharge Criteria: Patient will be discharged from PT if patient refuses treatment 3 consecutive times without medical reason, if treatment goals not met, if there is a change in medical status, if patient makes no progress towards goals or if patient is discharged from hospital.  The above assessment, treatment plan, treatment alternatives and goals were discussed and mutually agreed upon: by patient and by family  Sherlean SHAUNNA Perks 06/05/2024, 9:17 AM

## 2024-06-05 NOTE — Progress Notes (Signed)
 Occupational Therapy Session Note  Patient Details  Name: Danielle Flores MRN: [id number] Date of Birth: Sep 11, 1965  Today's Date: 06/05/2024 OT Individual Time: 8893-8867 OT Individual Time Calculation (min): 26 min  and Today's Date: 06/05/2024 OT Missed Time: 34 Minutes Missed Time Reason: Patient fatigue   Short Term Goals: Week 1:  OT Short Term Goal 1 (Week 1): STGs = LTGs  Skilled Therapeutic Interventions/Progress Updates:  Pt greeted supine in bed pt reports increased fatigue and hesitantly agreeable to OT intervention.      Transfers/bed mobility/functional mobility:  Pt completed supine<>sit with supervision from hospital bed and flat HOB in apt. Pt completed functional ambulation in apt with no AD and supervision. Pt completed ambulatory tub transfer with pt able to step over threshold with CGA with pt holding onto grab bar on L side.   Briefly discussed IADLs at home with pt declining actually practicing maneuvering in kitchen but did discuss energy conservation strategies for kitchen tasks. Pt reports increased fatigue from previous sessions with pt requesting to go back to bed.    Education:  Education provided on options for advertising account planner with pt reporting that she wants to purchase her own shower chair. Education provided on energy conservation strategies for functional mobility and IADLs.                  Ended session with pt supine in bed with all needs within reach and bed alarm activated.                    Therapy Documentation Precautions:  Precautions Precautions: Fall Precaution/Restrictions Comments: Monitor HR; dialysis T/TH/S Restrictions Weight Bearing Restrictions Per Provider Order: No General: General OT Amount of Missed Time: 34 Minutes  Pain: No pain    Therapy/Group: Individual Therapy  Ronal Mallie Needy 06/05/2024, 12:15 PM

## 2024-06-05 NOTE — Progress Notes (Signed)
 Contacted local [facility] clinic to request an update on pt's confirmed acceptance. Will await a response. Will assist as needed.   Randine Mungo Dialysis Navigator [phone]

## 2024-06-06 DIAGNOSIS — N170 Acute kidney failure with tubular necrosis: Secondary | ICD-10-CM | POA: Diagnosis not present

## 2024-06-06 DIAGNOSIS — E440 Moderate protein-calorie malnutrition: Secondary | ICD-10-CM | POA: Insufficient documentation

## 2024-06-06 DIAGNOSIS — D62 Acute posthemorrhagic anemia: Secondary | ICD-10-CM | POA: Diagnosis not present

## 2024-06-06 DIAGNOSIS — R5381 Other malaise: Secondary | ICD-10-CM | POA: Diagnosis not present

## 2024-06-06 LAB — RENAL FUNCTION PANEL
Albumin: 2.5 g/dL — ABNORMAL LOW (ref 3.5–5.0)
Anion gap: 19 — ABNORMAL HIGH (ref 5–15)
BUN: 42 mg/dL — ABNORMAL HIGH (ref 6–20)
CO2: 22 mmol/L (ref 22–32)
Calcium: 8.7 mg/dL — ABNORMAL LOW (ref 8.9–10.3)
Chloride: 95 mmol/L — ABNORMAL LOW (ref 98–111)
Creatinine, Ser: 7.27 mg/dL — ABNORMAL HIGH (ref 0.44–1.00)
GFR, Estimated: 6 mL/min — ABNORMAL LOW (ref >60)
Glucose, Bld: 115 mg/dL — ABNORMAL HIGH (ref 70–99)
Phosphorus: 8 mg/dL — ABNORMAL HIGH (ref 2.5–4.6)
Potassium: 3.7 mmol/L (ref 3.5–5.1)
Sodium: 136 mmol/L (ref 135–145)

## 2024-06-06 LAB — GLUCOSE, CAPILLARY
Glucose-Capillary: 121 mg/dL — ABNORMAL HIGH (ref 70–99)
Glucose-Capillary: 66 mg/dL — ABNORMAL LOW (ref 70–99)
Glucose-Capillary: 79 mg/dL (ref 70–99)
Glucose-Capillary: 151 mg/dL — ABNORMAL HIGH (ref 70–99)
Glucose-Capillary: 173 mg/dL — ABNORMAL HIGH (ref 70–99)
Glucose-Capillary: 131 mg/dL — ABNORMAL HIGH (ref 70–99)

## 2024-06-06 NOTE — Progress Notes (Signed)
 Physical Therapy Session Note  Patient Details  Name: Danielle Flores MRN: [id number] Date of Birth: 04/03/1966  Today's Date: 06/06/2024 PT Individual Time: 0952-1030; 1335 - 1405 PT Individual Time Calculation (min): 38 min; 30 min   Short Term Goals: Week 1:  PT Short Term Goal 1 (Week 1): STG = LTG d/t ELOS  SESSION 1 Skilled Therapeutic Interventions/Progress Updates: Patient supine in bed on entrance to room. Patient alert and agreeable to PT session.   Patient reported no pain only fatigue but willing to do what physically able. Missed time indicated due to patient fatigue at end of session   Therapeutic Activity: Bed Mobility: Pt performed supine<>sit on EOB independently with bed flat Transfers: Pt performed sit<>stand transfers throughout session with supervision for safety and no AD. Pt transported in [facility] to ortho gym and back to room at end of session for energy conservation.  Therapeutic Exercise: Pt performed the following exercises with therapist providing the described cuing and facilitation for improvement. - NuStep; 0.2 miles; 448 steps; 33 avg spm; 1.7 METs on level 4 resistance. Pt educated on modified RPE scale with pt instructed to rest when reaching 6-7/10. Pt required multiple rest breaks and HR maintaining 99-101 bpm. - Pt propelled WC from ortho gym<main gym with B UE's in order to increase UE endurance - Step ups with 6 step and 5lb ankle weights donned B LE's. Pt with no UE support and close supervision and performed 2 rounds close to fatigue with rest breaks required.   Patient supine in bed at end of session with brakes locked, and all needs within reach.   SESSION 2 Skilled Therapeutic Interventions/Progress Updates: Patient semi-reclined in bed on entrance to room. Patient alert and agreeable to PT session.   Pt reported increased fatigue but willing to do what physically able this session (reported drop in blood sugar prior to eating and that it was  on the rise per nsg). Pt performed all bed mobility and transfers independently.   5xSTS (avg = 21.97s) with B UE crossed over chest on first two trials, and use of B UE to push on 3rd due to reports of increase in low back pain from arthritis   - 23.7s  - 21.94s  - 20.35s  Therapeutic Exercise: Pt performed the following exercises with therapist providing the described cuing and facilitation for improvement. - Pt ambulated 300' while holding onto tidal tank in B UE's with close supervision for safety with cues to keep water as level as possible. Pt required seated rest - x 10 B shoulder press with tidal tank sitting  - x 10 truncal rotation with tidal tank  Patient supine in bed at end of session with brakes locked, and all needs within reach.      Therapy Documentation Precautions:  Precautions Precautions: Fall Precaution/Restrictions Comments: Monitor HR; dialysis T/TH/S Restrictions Weight Bearing Restrictions Per Provider Order: No   Therapy/Group: Individual Therapy  Mozell Haber PTA 06/06/2024, 11:56 AM

## 2024-06-06 NOTE — Progress Notes (Addendum)
 Occupational Therapy Session Note  Patient Details  Name: Danielle Flores MRN: [id number] Date of Birth: 1966-08-04  Today's Date: 06/06/2024 OT Missed Time: 30 Minutes Missed Time Reason: Patient fatigue;Patient ill (comment) (low blood sugar)   Short Term Goals: Week 1:  OT Short Term Goal 1 (Week 1): STGs = LTGs  Skilled Therapeutic Interventions/Progress Updates:      Therapy Documentation Precautions:  Precautions Precautions: Fall Precaution/Restrictions Comments: Monitor HR; dialysis T/TH/S Restrictions Weight Bearing Restrictions Per Provider Order: No General: Pt supine in bed upon OT arrival with nsg present giving pt snacks in order to increase blood sugar. Pt reporting increased fatigue this day and deferring therapy services. OT educating pt on importance of participating therapies. Pt politely declining further therapies.    Pt supine in bed with bed alarm activated, 2 bed rails up, call light within reach and 4Ps assessed. Nsg present   Therapy/Group: Individual Therapy  Camie Hoe, OTD, OTR/L 06/06/2024, 3:48 PM

## 2024-06-06 NOTE — Progress Notes (Signed)
 Occupational Therapy Session Note  Patient Details  Name: Danielle Flores MRN: [id number] Date of Birth: 09/04/1965  Today's Date: 06/06/2024 OT Individual Time: 9167-9084 OT Individual Time Calculation (min): 43 min    Short Term Goals: Week 1:  OT Short Term Goal 1 (Week 1): STGs = LTGs  Skilled Therapeutic Interventions/Progress Updates:    Pt received in room with spouse. She was dressed and ready for the day.  Pt ambulated independently from recliner to wc and was taken to orthogym to work on arm bike.  She had declined ambulation to gym as she stated she was extremely tired.    Focus of therapy session on building her endurance for activity tolerance with daily activity.  On arm bike:  3 min at 1 level with HR up to 113.  Rested for HR to reduce to 100.  Intensity at 3 with pace goal of 30 RPM. Pt completed 4 min with HR no higher than 113. She repeated 2 more sets.   Discussed the amount of time needed to rehab herself and to take one day at a time.    Reviewed new 15/7 schedule and goals.    Pt returned  to room and transferred to recliner independently.  In room with all needs met.   Therapy Documentation Precautions:  Precautions Precautions: Fall Precaution/Restrictions Comments: Monitor HR; dialysis T/TH/S Restrictions Weight Bearing Restrictions Per Provider Order: No    Vital Signs: Therapy Vitals Temp: 98.5 F (36.9 C) Pulse Rate: 92 Resp: 18 BP: 127/75 Patient Position (if appropriate): Lying Oxygen Therapy SpO2: 97 % O2 Device: Room Air Pain: No c/o pain ADL: ADL ADL Comments: Pt and spouse report she did all self care with S. Pt already bathed and dressed this am.   Therapy/Group: Individual Therapy  Bijan Ridgley 06/06/2024, 8:49 AM

## 2024-06-06 NOTE — Progress Notes (Addendum)
 Message left at local HD clinic to attempt to finalize pt's out-pt HD arrangements. Awaiting a return call.   Randine Mungo Dialysis Navigator [phone]  Addendum at 12:19 pm: Pt has been accepted at [facility] on TTS 11:30 am chair time. Pt will need to arrive at 10:30 am for first appt to complete paperwork prior to treatment. Start date at clinic to be determined based on pt's d/c date from rehab. Met with pt at bedside to discuss above arrangements. Pt agreeable to plans and schedule letter provided. Update provided to nephrologist and rehab CSW. Will assist as needed.

## 2024-06-06 NOTE — IPOC Note (Signed)
 Overall Plan of Care [facility]) Patient Details Name: Danielle Flores MRN: [id number] DOB: [birth year]  Admitting Diagnosis: Debility  Hospital Problems: Principal Problem:   Debility Active Problems:   Malnutrition of moderate degree     Functional Problem List: Nursing Bladder, Bowel, Edema, Endurance, Medication Management, Nutrition, Pain, Safety  PT Balance, Endurance, Motor  OT Endurance  SLP    TR         Basic ADL's: OT Dressing, Toileting, Bathing     Advanced  ADL's: OT Simple Meal Preparation     Transfers: PT Bed Mobility, Bed to Chair, Car, Occupational Psychologist, Research Scientist (life Sciences): PT Stairs, Ambulation     Additional Impairments: OT None  SLP        TR      Anticipated Outcomes Item Anticipated Outcome  Self Feeding independent  Swallowing      Basic self-care  independent dressing, mod ind bathing  Toileting  independent   Bathroom Transfers independent toilet, mod ind tub  Bowel/Bladder  manage bowels with medications/ patient  is oliguric  Transfers  Mod I  Locomotion  Mod I  Communication     Cognition     Pain  <4 w/ prns  Safety/Judgment  manage safety with supervision assistance   Therapy Plan: PT Intensity: Minimum of 1-2 x/day ,45 to 90 minutes PT Frequency: 5 out of 7 days PT Duration Estimated Length of Stay: 3-5 days OT Intensity: Minimum of 1-2 x/day, 45 to 90 minutes OT Frequency: Total of 15 hours over 7 days of combined therapies OT Duration/Estimated Length of Stay: 7-9 days     Team Interventions: Nursing Interventions Patient/Family Education, Bladder Management, Bowel Management, Disease Management/Prevention, Pain Management, Discharge Planning, Medication Management  PT interventions Community reintegration, DME/adaptive equipment instruction, Neuromuscular re-education, Psychosocial support, Stair training, UE/LE Strength taining/ROM, Wheelchair propulsion/positioning, Warden/ranger,  Ambulation/gait training, Discharge planning, Pain management, Therapeutic Activities, UE/LE Coordination activities, Cognitive remediation/compensation, Disease management/prevention, Functional mobility training, Patient/family education, Therapeutic Exercise, Visual/perceptual remediation/compensation  OT Interventions Functional mobility training, DME/adaptive equipment instruction, Discharge planning, Patient/family education, Psychosocial support, Therapeutic Activities, Therapeutic Exercise  SLP Interventions    TR Interventions    SW/CM Interventions Discharge Planning, Psychosocial Support, Patient/Family Education   Barriers to Discharge MD  Medical stability and Hemodialysis  Nursing Decreased caregiver support, Home environment access/layout, Hemodialysis Discharge: House  Discharge Home Layout: One level  Discharge Home Access: Stairs to enter  Entrance Stairs-Rails: None  Entrance Stairs-Number of Steps: 3  PT Decreased caregiver support, Lack of/limited family support, Hemodialysis    OT None    SLP      SW Insurance for SNF coverage, Decreased caregiver support, Hemodialysis     Team Discharge Planning: Destination: PT-Home ,OT- Home , SLP-  Projected Follow-up: PT-Outpatient PT, OT-  Home health OT, SLP-  Projected Equipment Needs: PT-To be determined, OT- Tub/shower seat, SLP-  Equipment Details: PT- , OT-  Patient/family involved in discharge planning: PT- Patient, Family member/caregiver,  OT-Patient, Family member/caregiver, SLP-   MD ELOS: 5-7d Medical Rehab Prognosis:  Good Assessment: The patient has been admitted for CIR therapies with the diagnosis of debility due to sepsis. The team will be addressing functional mobility, strength, stamina, balance, safety, adaptive techniques and equipment, self-care, bowel and bladder mgt, patient and caregiver education, acute renal failure requiring HD. Goals have been set at Mod I. Anticipated discharge destination is Home  .        See Team Conference Notes  for weekly updates to the plan of care

## 2024-06-06 NOTE — Progress Notes (Signed)
 [facility] Progress Note    Assessment/ Plan:    AKI - in setting of DKA & ischemic ATN in setting of volume depletion and acute illness as well as hypotension. Baseline Cr ~1. UA with gluc, blood, and protein.  Negative nitrite and renal US  without obstruction. S/p CRRT via temp line from 10/14-10/16/25. Transitioned to HD on 10/18  Appreciate VIR placing the right IJ [facility] 10/27 and removing the temp cath HD on MWF sched but can transition to TTS to facilitate PT, can come up to [facility]. No signs of significant renal recovery at this junction however need STRICT I/O to look at her trajectory.  She actually had 800 cc of urine output 3 days ago; only 338ml/24hrs, HD on Wed with NO UF tolerated. Renal function still going wrong direction but less of an increase; she is asymp and will hold at least another 24hrs. Trying to hold HD if possible to decrease risk of another renal insult.  She knows the hope is to liberalize her from dialysis but may take longer than expected.  Will continue to monitor for renal recovery.  I did give her a heads up that she may be leaving the hospital on dialysis and she is understanding of this.  Avoid nephrotoxic medications including NSAIDs and iodinated intravenous contrast exposure unless the latter is absolutely indicated.  Preferred narcotic agents for pain control are hydromorphone, fentanyl , and methadone. Morphine should not be used. Avoid Baclofen and avoid oral sodium phosphate and magnesium citrate based laxatives / bowel preps. Continue strict Input and Output monitoring. Will monitor the patient closely with you and intervene or adjust therapy as indicated by changes in clinical status/labs   Severe sepsis with septic shock due to RML pneumonia -s/p doxycycline.  Per primary service Hematuria: UA pending (collect when able), renal u/s 10/21 unrevealing, w/u per primary service Acute hypoxic respiratory failure - improved  Acute NSTEMI - on plavix   and being followed by Cardiology. Hyponatremia - due to DKA and AKI, will attempt to manage with HD Hypokalemia - due to DKA. resolved DKA - off insulin  drip per primary service Abnormal LFT's - likely shock liver.  Per primary Thrombocytopenia - due to acute critical illness--resolved AGMA - due to #1 and #7.  Metformin  stopped.  Improved with renal replacement therapy  Anemia - likely due to critical illness and AKI.  She will not accept blood transfusions (Jehovah's Witness).  ESA dose increased for 10/25.  Receiving IV iron since she is off antibiotics.  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 d/w RD. Will recheck phos on Fr -> 8.7. Severe protein malnutrition - alb low.  protein supplements per RD.  In CIR and tired but tolerating; will start CLIP process for AKI if not recovered when she's closer to d/c.Danielle   Subjective:   She reports good UOP but only measured; denies fever, chills, nausea, shortness of breath.  Tolerated HD wed with no UF. Spouse not bedside today.    Objective:   BP 127/75 (BP Location: Left Arm)   Pulse 92   Temp 98.5 F (36.9 C)   Resp 18   Ht 5' 5 (1.651 m)   Wt 87 kg   LMP  (LMP Unknown)   SpO2 97%   BMI 31.92 kg/m   Intake/Output Summary (Last 24 hours) at 06/06/2024 0727 Last data filed at 06/05/2024 1824 Gross per  24 hour  Intake 620 ml  Output 350 ml  Net 270 ml    Weight change:   Physical Exam: Gen: NAD, laying flat in bed CVS: RRR Resp: CTA B/L, normal wob on RA Jai:dnqu, nt/nd Ext: no edema Neuro: awake, alert Dialysis access: RIJ tunneled HD catheter c/d/i  Imaging: DG Abd 1 View Result Date: 06/05/2024 CLINICAL DATA:  807105 Abdominal discomfort [id number] EXAM: ABDOMEN - 1 VIEW COMPARISON:  Abdominopelvic CT 05/19/2024 FINDINGS: 1032 hours. Two supine portable views of the abdomen are submitted. There is a normal  nonobstructive bowel gas pattern. No supine evidence of pneumoperitoneum. Small calcified uterine fibroids noted. No suspicious abdominal calcifications or acute osseous findings. The aeration of the lung bases has improved from the previous CT. IMPRESSION: No evidence of acute abdominal process. Improved aeration of the lung bases. Electronically Signed   By: Elsie Perone M.D.   On: 06/05/2024 14:29      Labs: BMET Recent Labs  Lab 05/31/24 0538 06/01/24 9178 06/02/24 0329 06/03/24 0845 06/04/24 0250 06/05/24 1821 06/06/24 0506  NA 136 134* 137 138 137 134* 136  K 4.2 4.0 4.1 4.1 4.2 3.7 3.7  CL 98 95* 98 100 100 94* 95*  CO2 22 24 21* 20* 18* 23 22  GLUCOSE 134* 155* 114* 216* 93 154* 115*  BUN 65* 30* 39* 49* 56* 40* 42*  CREATININE 8.09* 5.65* 7.27* 9.24* 9.75* 6.94* 7.27*  CALCIUM  8.1* 7.9* 8.2* 8.3* 8.7* 8.5* 8.7*  PHOS 9.7* 6.5* 8.8* 10.1* 10.1* 7.2* 8.0*   CBC Recent Labs  Lab 06/01/24 0821 06/02/24 0329 06/03/24 0845 06/04/24 0250  WBC 6.8 6.6 7.8 7.4  NEUTROABS 4.9 4.5 6.3 5.5  HGB 6.8* 7.0* 7.4* 7.2*  HCT 20.7* 21.3* 22.3* 21.9*  MCV 75.0* 74.7* 75.1* 76.6*  PLT 336 382 379 352    Medications:     (feeding supplement) PROSource Plus  30 mL Oral Daily   Chlorhexidine Gluconate Cloth  6 each Topical BID   clopidogrel   75 mg Oral Daily   [START ON 06/08/2024] darbepoetin (ARANESP) injection - DIALYSIS  150 mcg Subcutaneous Q Sun-1800   ezetimibe   10 mg Oral Daily   feeding supplement (NEPRO CARB STEADY)  237 mL Oral BID BM   ferric citrate  420 mg Oral TID WC   Gerhardt's butt cream   Topical Daily   heparin   5,000 Units Subcutaneous Q8H   insulin  aspart  0-5 Units Subcutaneous QHS   insulin  aspart  0-9 Units Subcutaneous TID WC   insulin  aspart  8 Units Subcutaneous TID WC   insulin  glargine-yfgn  22 Units Subcutaneous Daily   metoprolol  tartrate  50 mg Oral BID   multivitamin  1 tablet Oral QHS

## 2024-06-06 NOTE — Progress Notes (Signed)
 PROGRESS NOTE   Subjective/Complaints:  Reviewed KUB, moderate amt of stool, mild -mod degenerative changes in both hips, SI joints and lumbo sacral spine  Discussed results with the patient Patient does not have any pain complaints today  ROS- - CP, SOB, denies abd pain  Objective:   DG Abd 1 View Result Date: 06/05/2024 CLINICAL DATA:  807105 Abdominal discomfort [id number] EXAM: ABDOMEN - 1 VIEW COMPARISON:  Abdominopelvic CT 05/19/2024 FINDINGS: 1032 hours. Two supine portable views of the abdomen are submitted. There is a normal nonobstructive bowel gas pattern. No supine evidence of pneumoperitoneum. Small calcified uterine fibroids noted. No suspicious abdominal calcifications or acute osseous findings. The aeration of the lung bases has improved from the previous CT. IMPRESSION: No evidence of acute abdominal process. Improved aeration of the lung bases. Electronically Signed   By: Elsie Perone M.D.   On: 06/05/2024 14:29   Recent Labs    06/03/24 0845 06/04/24 0250  WBC 7.8 7.4  HGB 7.4* 7.2*  HCT 22.3* 21.9*  PLT 379 352   Recent Labs    06/05/24 1821 06/06/24 0506  NA 134* 136  K 3.7 3.7  CL 94* 95*  CO2 23 22  GLUCOSE 154* 115*  BUN 40* 42*  CREATININE 6.94* 7.27*  CALCIUM  8.5* 8.7*    Intake/Output Summary (Last 24 hours) at 06/06/2024 0748 Last data filed at 06/05/2024 1824 Gross per 24 hour  Intake 620 ml  Output 350 ml  Net 270 ml        Physical Exam: Vital Signs Blood pressure 127/75, pulse 92, temperature 98.5 F (36.9 C), resp. rate 18, height 5' 5 (1.651 m), weight 87 kg, SpO2 97%.  General: No acute distress Mood and affect are appropriate Heart: Regular rate and rhythm no rubs murmurs or extra sounds Lungs: Clear to auscultation, breathing unlabored, no rales or wheezes Abdomen: Positive bowel sounds, soft nontender to palpation, nondistended Extremities: No clubbing, cyanosis,  or edema Skin: No evidence of breakdown, no evidence of rash, Right upper chest HD cath CDI , no erythema  Neurologic: Cranial nerves II through XII intact, motor strength is 5/5 in bilateral deltoid, bicep, tricep, grip, hip flexor, knee extensors, ankle dorsiflexor and plantar flexor Sensory exam reduced sensation to light touch left tip of middle finger Cerebellar exam normal finger to nose to finger as well as heel to shin in bilateral upper and lower extremities Musculoskeletal: Full range of motion in all 4 extremities. No joint swelling    Assessment/Plan: 1. Functional deficits which require 3+ hours per day of interdisciplinary therapy in a comprehensive inpatient rehab setting. Physiatrist is providing close team supervision and 24 hour management of active medical problems listed below. Physiatrist and rehab team continue to assess barriers to discharge/monitor patient progress toward functional and medical goals  Care Tool:  Bathing    Body parts bathed by patient: Right arm, Left arm, Chest, Abdomen, Front perineal area, Buttocks, Face, Left lower leg, Right lower leg, Left upper leg, Right upper leg         Bathing assist Assist Level: Supervision/Verbal cueing     Upper Body Dressing/Undressing Upper body dressing   What is the  patient wearing?: Pull over shirt    Upper body assist Assist Level: Supervision/Verbal cueing    Lower Body Dressing/Undressing Lower body dressing      What is the patient wearing?: Pants, Underwear/pull up     Lower body assist Assist for lower body dressing: Supervision/Verbal cueing     Toileting Toileting    Toileting assist Assist for toileting: Supervision/Verbal cueing     Transfers Chair/bed transfer  Transfers assist     Chair/bed transfer assist level: Independent     Locomotion Ambulation   Ambulation assist      Assist level: Supervision/Verbal cueing Assistive device: No Device Max distance: 632'    Walk 10 feet activity   Assist     Assist level: Supervision/Verbal cueing Assistive device: No Device   Walk 50 feet activity   Assist    Assist level: Supervision/Verbal cueing Assistive device: No Device    Walk 150 feet activity   Assist    Assist level: Supervision/Verbal cueing Assistive device: No Device    Walk 10 feet on uneven surface  activity   Assist Walk 10 feet on uneven surfaces activity did not occur: Safety/medical concerns (d/t fatigue)         Wheelchair     Assist Is the patient using a wheelchair?: Yes Type of Wheelchair: Manual    Wheelchair assist level: Independent Max wheelchair distance: 150'    Wheelchair 50 feet with 2 turns activity    Assist        Assist Level: Independent   Wheelchair 150 feet activity     Assist      Assist Level: Independent   Blood pressure 127/75, pulse 92, temperature 98.5 F (36.9 C), resp. rate 18, height 5' 5 (1.651 m), weight 87 kg, SpO2 97%.  Medical Problem List and Plan: 1. Functional deficits- debility secondary to sepsis due to  pneumonia             -patient may shower             -ELOS/Goals: 8-12 days S             Admit to CIR   2.  Antithrombotics: -DVT/anticoagulation:  Pharmaceutical: Heparin              -antiplatelet therapy: Plavix  3. Pain Management: Tylenol prn.  4. Mood/Behavior/Sleep: LCSW to follow for evaluation and support.              -antipsychotic agents: N/A 5. Neuropsych/cognition: This patient is capable of making decisions on her own behalf. 6. Skin/Wound Care: Routine pressure measures. 7. Fluids/Electrolytes/Nutrition: Strict I/O. Daily weights.    8.   Septic shock due to multifocal PNA: Has completed Unasyn-->doxycycline thru 10/21.             --diarrhea has resolved.    9.  T2DM poorly controlled: Hgb A1c-9.9. Monitor BS ac/hs and use SSI for elevated BS. Off metformin . Used 70/30 insulin  bid PTA.               --continue  Insulin  glargine 22 units with 8 units novolog  tid ac  CBG (last 3)  Recent Labs    06/05/24 1700 06/05/24 2118 06/06/24 0634  GLUCAP 90 110* 121*  Controlled monitor   10. AKI due to septic shock: HD dependent at this time. Schedule dialysis at the end of the day to help with tolerance of therapy             --Strict I/O. Daily  weights.              --continue Auyrxia for elevated phos.    11. Anemia of acute illness: Treated with Venofer and Aranesp. Initial Hgb at admit was 14gm, check stool OB --Hgb  improved from 6.8-->7.4- stable will check stool OB, results not available yet      Latest Ref Rng & Units 06/04/2024    2:50 AM 06/03/2024    8:45 AM 06/02/2024    3:29 AM  CBC  WBC 4.0 - 10.5 K/uL 7.4  7.8  6.6   Hemoglobin 12.0 - 15.0 g/dL 7.2  7.4  7.0   Hematocrit 36.0 - 46.0 % 21.9  22.3  21.3   Platelets 150 - 400 K/uL 352  379  382     12. CAD: Continue plavix . BB resumed on 10/29 due to sinus tachycardia. Tachy may be related to anemia   13. Abnormal LFTs: Likely due to shocked liver. Will recheck tomorrow.          LOS: 2 days A FACE TO FACE EVALUATION WAS PERFORMED  Prentice FORBES Compton 06/06/2024, 7:48 AM

## 2024-06-06 NOTE — Plan of Care (Signed)
  Problem: Consults Goal: RH GENERAL PATIENT EDUCATION Description: See Patient Education module for education specifics. Outcome: Progressing Goal: Nutrition Consult-if indicated Outcome: Progressing Goal: Diabetes Guidelines if Diabetic/Glucose > 140 Description: If diabetic or lab glucose is > 140 mg/dl - Initiate Diabetes/Hyperglycemia Guidelines & Document Interventions  Outcome: Progressing   Problem: RH BOWEL ELIMINATION Goal: RH STG MANAGE BOWEL WITH ASSISTANCE Description: STG Manage Bowel with supervision Assistance. Outcome: Progressing   Problem: RH SKIN INTEGRITY Goal: RH STG SKIN FREE OF INFECTION/BREAKDOWN Description: Manage skin free of infection with supervision assistance Outcome: Progressing   Problem: RH SAFETY Goal: RH STG ADHERE TO SAFETY PRECAUTIONS W/ASSISTANCE/DEVICE Description: STG Adhere to Safety Precautions With supervision Assistance/Device. Outcome: Progressing   Problem: RH PAIN MANAGEMENT Goal: RH STG PAIN MANAGED AT OR BELOW PT'S PAIN GOAL Description: <4 w/ prns Outcome: Progressing   Problem: RH KNOWLEDGE DEFICIT GENERAL Goal: RH STG INCREASE KNOWLEDGE OF SELF CARE AFTER HOSPITALIZATION Description: Manage increase knowledge of self care after hospitalization with supervision assistance from spouse using educational materials provided  Outcome: Progressing   Problem: Education: Goal: Ability to describe self-care measures that may prevent or decrease complications (Diabetes Survival Skills Education) will improve Outcome: Progressing Goal: Individualized Educational Video(s) Outcome: Progressing   Problem: Coping: Goal: Ability to adjust to condition or change in health will improve Outcome: Progressing   Problem: Fluid Volume: Goal: Ability to maintain a balanced intake and output will improve Outcome: Progressing   Problem: Health Behavior/Discharge Planning: Goal: Ability to identify and utilize available resources and  services will improve Outcome: Progressing Goal: Ability to manage health-related needs will improve Outcome: Progressing   Problem: Metabolic: Goal: Ability to maintain appropriate glucose levels will improve Outcome: Progressing   Problem: Nutritional: Goal: Maintenance of adequate nutrition will improve Outcome: Progressing Goal: Progress toward achieving an optimal weight will improve Outcome: Progressing   Problem: Skin Integrity: Goal: Risk for impaired skin integrity will decrease Outcome: Progressing   Problem: Tissue Perfusion: Goal: Adequacy of tissue perfusion will improve Outcome: Progressing

## 2024-06-07 DIAGNOSIS — R5381 Other malaise: Secondary | ICD-10-CM | POA: Diagnosis not present

## 2024-06-07 DIAGNOSIS — N179 Acute kidney failure, unspecified: Secondary | ICD-10-CM

## 2024-06-07 DIAGNOSIS — D638 Anemia in other chronic diseases classified elsewhere: Secondary | ICD-10-CM

## 2024-06-07 DIAGNOSIS — R739 Hyperglycemia, unspecified: Secondary | ICD-10-CM | POA: Diagnosis not present

## 2024-06-07 DIAGNOSIS — Z992 Dependence on renal dialysis: Secondary | ICD-10-CM

## 2024-06-07 LAB — BASIC METABOLIC PANEL WITH GFR
Anion gap: 17 — ABNORMAL HIGH (ref 5–15)
BUN: 62 mg/dL — ABNORMAL HIGH (ref 6–20)
CO2: 21 mmol/L — ABNORMAL LOW (ref 22–32)
Calcium: 8.8 mg/dL — ABNORMAL LOW (ref 8.9–10.3)
Chloride: 98 mmol/L (ref 98–111)
Creatinine, Ser: 7.52 mg/dL — ABNORMAL HIGH (ref 0.44–1.00)
GFR, Estimated: 6 mL/min — ABNORMAL LOW (ref >60)
Glucose, Bld: 120 mg/dL — ABNORMAL HIGH (ref 70–99)
Potassium: 3.6 mmol/L (ref 3.5–5.1)
Sodium: 136 mmol/L (ref 135–145)

## 2024-06-07 LAB — GLUCOSE, CAPILLARY
Glucose-Capillary: 213 mg/dL — ABNORMAL HIGH (ref 70–99)
Glucose-Capillary: 118 mg/dL — ABNORMAL HIGH (ref 70–99)
Glucose-Capillary: 215 mg/dL — ABNORMAL HIGH (ref 70–99)
Glucose-Capillary: 119 mg/dL — ABNORMAL HIGH (ref 70–99)
Glucose-Capillary: 138 mg/dL — ABNORMAL HIGH (ref 70–99)

## 2024-06-07 NOTE — Plan of Care (Signed)
  Problem: Consults Goal: RH GENERAL PATIENT EDUCATION Description: See Patient Education module for education specifics. Outcome: Progressing Goal: Nutrition Consult-if indicated Outcome: Progressing Goal: Diabetes Guidelines if Diabetic/Glucose > 140 Description: If diabetic or lab glucose is > 140 mg/dl - Initiate Diabetes/Hyperglycemia Guidelines & Document Interventions  Outcome: Progressing   Problem: RH BOWEL ELIMINATION Goal: RH STG MANAGE BOWEL WITH ASSISTANCE Description: STG Manage Bowel with supervision Assistance. Outcome: Progressing   Problem: RH SKIN INTEGRITY Goal: RH STG SKIN FREE OF INFECTION/BREAKDOWN Description: Manage skin free of infection with supervision assistance Outcome: Progressing   Problem: RH SAFETY Goal: RH STG ADHERE TO SAFETY PRECAUTIONS W/ASSISTANCE/DEVICE Description: STG Adhere to Safety Precautions With supervision Assistance/Device. Outcome: Progressing   Problem: RH PAIN MANAGEMENT Goal: RH STG PAIN MANAGED AT OR BELOW PT'S PAIN GOAL Description: <4 w/ prns Outcome: Progressing   Problem: RH KNOWLEDGE DEFICIT GENERAL Goal: RH STG INCREASE KNOWLEDGE OF SELF CARE AFTER HOSPITALIZATION Description: Manage increase knowledge of self care after hospitalization with supervision assistance from spouse using educational materials provided  Outcome: Progressing   Problem: Education: Goal: Ability to describe self-care measures that may prevent or decrease complications (Diabetes Survival Skills Education) will improve Outcome: Progressing Goal: Individualized Educational Video(s) Outcome: Progressing   Problem: Coping: Goal: Ability to adjust to condition or change in health will improve Outcome: Progressing   Problem: Fluid Volume: Goal: Ability to maintain a balanced intake and output will improve Outcome: Progressing   Problem: Health Behavior/Discharge Planning: Goal: Ability to identify and utilize available resources and  services will improve Outcome: Progressing Goal: Ability to manage health-related needs will improve Outcome: Progressing   Problem: Metabolic: Goal: Ability to maintain appropriate glucose levels will improve Outcome: Progressing   Problem: Nutritional: Goal: Maintenance of adequate nutrition will improve Outcome: Progressing Goal: Progress toward achieving an optimal weight will improve Outcome: Progressing   Problem: Skin Integrity: Goal: Risk for impaired skin integrity will decrease Outcome: Progressing   Problem: Tissue Perfusion: Goal: Adequacy of tissue perfusion will improve Outcome: Progressing

## 2024-06-07 NOTE — Progress Notes (Signed)
 Occupational Therapy Session Note  Patient Details  Name: Danielle Flores MRN: [id number] Date of Birth: 1966/04/09  Today's Date: 06/07/2024 OT Individual Time: 9299-9254 OT Individual Time Calculation (min): 45 min    Short Term Goals: Week 1:  OT Short Term Goal 1 (Week 1): STGs = LTGs  Skilled Therapeutic Interventions/Progress Updates:   Patient agreeable to participate in OT session. Reports no pain level.   Patient participated in skilled OT session focusing on functional endurance, dressing, and grooming morning routine. Patient able to complete dressing mod I with selection of clothes from closet. Patient then able to complete NuStep UBE for 10 minutes 3 rounds of 3-3.5 minutes to increase functional activity tolerance and regain UE strength. Patient HR remained in acceptable range during functional activity. Patient then able to complete functional mobility with OT facilitated decreased pace to maintain HR zone. Patient then returned to room all needs in reach for breakfast.   Therapy Documentation Precautions:  Precautions Precautions: Fall Precaution/Restrictions Comments: Monitor HR; dialysis T/TH/S Restrictions Weight Bearing Restrictions Per Provider Order: No Therapy/Group: Individual Therapy  [name] 06/07/2024, 6:49 AM

## 2024-06-07 NOTE — Progress Notes (Signed)
 [facility] Progress Note    Assessment/ Plan:    AKI - in setting of DKA & ischemic ATN in setting of volume depletion and acute illness as well as hypotension. Baseline Cr ~1. UA with gluc, blood, and protein.  Negative nitrite and renal US  without obstruction. S/p CRRT via temp line from 10/14-10/16/25. Transitioned to HD on 10/18  Appreciate VIR placing the right IJ [facility] 10/27 and removing the temp cath HD on MWF sched but can transition to TTS to facilitate PT, can come up to [facility]. No signs of significant renal recovery at this junction however need STRICT I/O to look at her trajectory.  She actually had 800 cc of urine output 3 days ago; only 323ml/24hrs, HD on Wed with NO UF tolerated. Renal function essentially same as yesterday but UOP 1L past 24hrs. Continue holding HD for now; anticipate stabilization and recovery in next few days. She knows the hope is to liberalize her from dialysis but may take longer than expected.  Will continue to monitor for renal recovery.  I did give her a heads up that she may be leaving the hospital on dialysis and she is understanding of this.  Avoid nephrotoxic medications including NSAIDs and iodinated intravenous contrast exposure unless the latter is absolutely indicated.  Preferred narcotic agents for pain control are hydromorphone, fentanyl , and methadone. Morphine should not be used. Avoid Baclofen and avoid oral sodium phosphate and magnesium citrate based laxatives / bowel preps. Continue strict Input and Output monitoring. Will monitor the patient closely with you and intervene or adjust therapy as indicated by changes in clinical status/labs   Severe sepsis with septic shock due to RML pneumonia -s/p doxycycline.  Per primary service Hematuria: UA pending (collect when able), renal u/s 10/21 unrevealing, w/u per primary service Acute hypoxic respiratory failure - improved  Acute NSTEMI - on plavix  and being followed by  Cardiology. Hyponatremia - due to DKA and AKI, will attempt to manage with HD Hypokalemia - due to DKA. resolved DKA - off insulin  drip per primary service Abnormal LFT's - likely shock liver.  Per primary Thrombocytopenia - due to acute critical illness--resolved AGMA - due to #1 and #7.  Metformin  stopped.  Improved with renal replacement therapy  Anemia - likely due to critical illness and AKI.  She will not accept blood transfusions (Jehovah's Witness).  ESA dose increased for 10/25.  Receiving IV iron since she is off antibiotics.  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 d/w RD. Will recheck phos on Fr -> 8.7 -> recheck Tues. Upset her stomach and will try 2 tabs but spaced out through the meal Severe protein malnutrition - alb low.  protein supplements per RD.  In CIR and tired but tolerating; will start CLIP process for AKI if not recovered when she's closer to d/c.Danielle Flores   Subjective:   She reports good UOP and finally 1L /24hrs. Denies fever, chills, nausea, shortness of breath.  Tolerated HD wed with no UF. Spouse bedside updated.    Objective:   BP 135/70 (BP Location: Right Arm)   Pulse 92   Temp 98.8 F (37.1 C) (Oral)   Resp 18   Ht 5' 5 (1.651 m)   Wt 87 kg   LMP  (LMP Unknown)   SpO2 100%   BMI 31.92 kg/m   Intake/Output Summary (Last 24 hours) at 06/07/2024 1136 Last data  filed at 06/07/2024 0500 Gross per 24 hour  Intake 950 ml  Output 700 ml  Net 250 ml    Weight change:   Physical Exam: Gen: NAD, laying flat in bed CVS: RRR Resp: CTA B/L, normal wob on RA Jai:dnqu, nt/nd Ext: no edema Neuro: awake, alert Dialysis access: RIJ tunneled HD catheter c/d/i  Imaging: No results found.     Labs: BMET Recent Labs  Lab 06/01/24 0821 06/02/24 0329 06/03/24 0845 06/04/24 0250 06/05/24 1821 06/06/24 0506 06/07/24 0634  NA 134* 137 138 137 134* 136 136   K 4.0 4.1 4.1 4.2 3.7 3.7 3.6  CL 95* 98 100 100 94* 95* 98  CO2 24 21* 20* 18* 23 22 21*  GLUCOSE 155* 114* 216* 93 154* 115* 120*  BUN 30* 39* 49* 56* 40* 42* 62*  CREATININE 5.65* 7.27* 9.24* 9.75* 6.94* 7.27* 7.52*  CALCIUM  7.9* 8.2* 8.3* 8.7* 8.5* 8.7* 8.8*  PHOS 6.5* 8.8* 10.1* 10.1* 7.2* 8.0*  --    CBC Recent Labs  Lab 06/01/24 0821 06/02/24 0329 06/03/24 0845 06/04/24 0250  WBC 6.8 6.6 7.8 7.4  NEUTROABS 4.9 4.5 6.3 5.5  HGB 6.8* 7.0* 7.4* 7.2*  HCT 20.7* 21.3* 22.3* 21.9*  MCV 75.0* 74.7* 75.1* 76.6*  PLT 336 382 379 352    Medications:     (feeding supplement) PROSource Plus  30 mL Oral Daily   Chlorhexidine Gluconate Cloth  6 each Topical BID   clopidogrel   75 mg Oral Daily   [START ON 06/08/2024] darbepoetin (ARANESP) injection - DIALYSIS  150 mcg Subcutaneous Q Sun-1800   ezetimibe   10 mg Oral Daily   feeding supplement (NEPRO CARB STEADY)  237 mL Oral BID BM   ferric citrate  420 mg Oral TID WC   Gerhardt's butt cream   Topical Daily   heparin   5,000 Units Subcutaneous Q8H   insulin  aspart  0-5 Units Subcutaneous QHS   insulin  aspart  0-9 Units Subcutaneous TID WC   insulin  aspart  8 Units Subcutaneous TID WC   insulin  glargine-yfgn  22 Units Subcutaneous Daily   metoprolol  tartrate  50 mg Oral BID   multivitamin  1 tablet Oral QHS

## 2024-06-07 NOTE — Progress Notes (Signed)
 Occupational Therapy Session Note  Patient Details  Name: ADDI PAK MRN: [id number] Date of Birth: Apr 18, 1966  Today's Date: 06/07/2024 OT Individual Time: 1000-1045 OT Individual Time Calculation (min): 45 min    Short Term Goals: Week 1:  OT Short Term Goal 1 (Week 1): STGs = LTGs  Skilled Therapeutic Interventions/Progress Updates:    Patient resting in bed indicating that she was tired.  The pt rated her level of fatigue at 7 on a o-10.  The pt indicated that she rested well during the night with no pain to report. I explained to the pt the benefits of active engagement in the therapeutic process for  safe and Ind return to home.    The pt present with good trunk strength was able to maintain good sit balance against external force. The pt is able to simulated LB dressing with closeS. The pt went on to complete zoom ball in various planes for activity tolerance. The pt was able to complete the 3lb dumb bells for shld flex, horizontal abduction and elbow extension. At the end of the session, the call light and bed side table were placed within reach with all additional needs addressed.   The pt went on to  Therapy Documentation Precautions:  Precautions Precautions: Fall Precaution/Restrictions Comments: Monitor HR; dialysis T/TH/S Restrictions Weight Bearing Restrictions Per Provider Order: No  Therapy/Group: Individual Therapy  Elvera JONETTA Mace 06/07/2024, 4:37 PM

## 2024-06-07 NOTE — Progress Notes (Addendum)
 PROGRESS NOTE   Subjective/Complaints:  Pt doing well, slept well, denies pain, LBM this morning, urinating normally for her. No other complaints or concerns.   ROS- neg CP, SOB, abd pain, n/v/d/c  Objective:   DG Abd 1 View Result Date: 06/05/2024 CLINICAL DATA:  807105 Abdominal discomfort [id number] EXAM: ABDOMEN - 1 VIEW COMPARISON:  Abdominopelvic CT 05/19/2024 FINDINGS: 1032 hours. Two supine portable views of the abdomen are submitted. There is a normal nonobstructive bowel gas pattern. No supine evidence of pneumoperitoneum. Small calcified uterine fibroids noted. No suspicious abdominal calcifications or acute osseous findings. The aeration of the lung bases has improved from the previous CT. IMPRESSION: No evidence of acute abdominal process. Improved aeration of the lung bases. Electronically Signed   By: Elsie Perone M.D.   On: 06/05/2024 14:29   No results for input(s): WBC, HGB, HCT, PLT in the last 72 hours.  Recent Labs    06/06/24 0506 06/07/24 0634  NA 136 136  K 3.7 3.6  CL 95* 98  CO2 22 21*  GLUCOSE 115* 120*  BUN 42* 62*  CREATININE 7.27* 7.52*  CALCIUM  8.7* 8.8*    Intake/Output Summary (Last 24 hours) at 06/07/2024 0948 Last data filed at 06/07/2024 0500 Gross per 24 hour  Intake 950 ml  Output 700 ml  Net 250 ml        Physical Exam: Vital Signs Blood pressure 135/70, pulse 92, temperature 98.8 F (37.1 C), temperature source Oral, resp. rate 18, height 5' 5 (1.651 m), weight 87 kg, SpO2 100%.  General: No acute distress, working in OT Mood and affect are flat Heart: Regular rate and rhythm no rubs murmurs or extra sounds, dialysis catheter R chest c/d/i Lungs: Clear to auscultation, breathing unlabored, no rales or wheezes Abdomen: Positive bowel sounds, soft nontender to palpation, nondistended Extremities: No clubbing, cyanosis, or edema Skin: No evidence of breakdown, no  evidence of rash, Right upper chest HD cath CDI , no erythema   PRIOR EXAMS: Neurologic: Cranial nerves II through XII intact, motor strength is 5/5 in bilateral deltoid, bicep, tricep, grip, hip flexor, knee extensors, ankle dorsiflexor and plantar flexor Sensory exam reduced sensation to light touch left tip of middle finger Cerebellar exam normal finger to nose to finger as well as heel to shin in bilateral upper and lower extremities Musculoskeletal: Full range of motion in all 4 extremities. No joint swelling    Assessment/Plan: 1. Functional deficits which require 3+ hours per day of interdisciplinary therapy in a comprehensive inpatient rehab setting. Physiatrist is providing close team supervision and 24 hour management of active medical problems listed below. Physiatrist and rehab team continue to assess barriers to discharge/monitor patient progress toward functional and medical goals  Care Tool:  Bathing    Body parts bathed by patient: Right arm, Left arm, Chest, Abdomen, Front perineal area, Buttocks, Face, Left lower leg, Right lower leg, Left upper leg, Right upper leg         Bathing assist Assist Level: Supervision/Verbal cueing     Upper Body Dressing/Undressing Upper body dressing   What is the patient wearing?: Pull over shirt    Upper body assist Assist  Level: Supervision/Verbal cueing    Lower Body Dressing/Undressing Lower body dressing      What is the patient wearing?: Pants, Underwear/pull up     Lower body assist Assist for lower body dressing: Supervision/Verbal cueing     Toileting Toileting    Toileting assist Assist for toileting: Supervision/Verbal cueing     Transfers Chair/bed transfer  Transfers assist     Chair/bed transfer assist level: Independent     Locomotion Ambulation   Ambulation assist      Assist level: Supervision/Verbal cueing Assistive device: No Device Max distance: 632'   Walk 10 feet  activity   Assist     Assist level: Supervision/Verbal cueing Assistive device: No Device   Walk 50 feet activity   Assist    Assist level: Supervision/Verbal cueing Assistive device: No Device    Walk 150 feet activity   Assist    Assist level: Supervision/Verbal cueing Assistive device: No Device    Walk 10 feet on uneven surface  activity   Assist Walk 10 feet on uneven surfaces activity did not occur: Safety/medical concerns (d/t fatigue)         Wheelchair     Assist Is the patient using a wheelchair?: Yes Type of Wheelchair: Manual    Wheelchair assist level: Independent Max wheelchair distance: 150'    Wheelchair 50 feet with 2 turns activity    Assist        Assist Level: Independent   Wheelchair 150 feet activity     Assist      Assist Level: Independent   Blood pressure 135/70, pulse 92, temperature 98.8 F (37.1 C), temperature source Oral, resp. rate 18, height 5' 5 (1.651 m), weight 87 kg, SpO2 100%.  Medical Problem List and Plan: 1. Functional deficits- debility secondary to sepsis due to  pneumonia             -patient may shower             -ELOS/Goals: 8-12 days S             Admit to CIR   2.  Antithrombotics: -DVT/anticoagulation:  Pharmaceutical: Heparin  5ku q8h             -antiplatelet therapy: Plavix  daily 3. Pain Management: Tylenol prn.  4. Mood/Behavior/Sleep: LCSW to follow for evaluation and support.              -antipsychotic agents: N/A 5. Neuropsych/cognition: This patient is capable of making decisions on her own behalf. 6. Skin/Wound Care: Routine pressure measures. 7. Fluids/Electrolytes/Nutrition: Strict I/O. Daily weights. Continue vitamins/supplements   -06/07/24 labs stable 8.   Septic shock due to multifocal PNA: Has completed Unasyn-->doxycycline thru 10/21.             --diarrhea has resolved.    9.  T2DM poorly controlled: Hgb A1c-9.9. Monitor BS ac/hs and use SSI for elevated BS.  Off metformin . Used 70/30 insulin  bid PTA.               --continue Insulin  glargine 22 units with 8 units novolog  tid ac  -06/07/24 CBGs pretty well controlled, monitor -OF NOTE: patient received 22u lantus  last night around 11:45pm accidentally by nursing, nursing reporting that she hadn't been getting her 8u mealtime novolog  much of the day d/t lower CBGs earlier? CBG prior to administration of erroneous Lantus  was 131. This was reported to me and discussed around 12:20am-- pt was still awake. I instructed nursing to give her an  Ensure for extra carb/protein, recheck CBG at 3am and as long as it was ok, recheck at 5:30am, then HOLD 8am Lantus  since this was given at basically midnight. CBG at 3:30 was 213 as anticipated, 0620 CBG was 118. Anticipate CBGs will be off through tomorrow but hopefully will be able to see return to trend by Monday.  CBG (last 3)  Recent Labs    06/06/24 2230 06/07/24 0339 06/07/24 0620  GLUCAP 131* 213* 118*     10. AKI due to septic shock: HD dependent at this time. Schedule dialysis at the end of the day to help with tolerance of therapy             --Strict I/O. Daily weights.              --continue Auyrxia for elevated phos.    11. Anemia of acute illness: Treated with Venofer and Aranesp. Initial Hgb at admit was 14gm, check stool OB --Hgb  improved from 6.8-->7.4- stable will check stool OB, results not available yet      Latest Ref Rng & Units 06/04/2024    2:50 AM 06/03/2024    8:45 AM 06/02/2024    3:29 AM  CBC  WBC 4.0 - 10.5 K/uL 7.4  7.8  6.6   Hemoglobin 12.0 - 15.0 g/dL 7.2  7.4  7.0   Hematocrit 36.0 - 46.0 % 21.9  22.3  21.3   Platelets 150 - 400 K/uL 352  379  382     12. CAD: Continue plavix . Metoprolol  50mg  BID resumed on 10/29 due to sinus tachycardia. Tachy may be related to anemia   13. Abnormal LFTs: Likely due to shocked liver. Improved 10/30     14. HLD: continue zetia  10mg  daily    LOS: 3 days A FACE TO FACE EVALUATION  WAS PERFORMED  [address] 06/07/2024, 9:48 AM

## 2024-06-07 NOTE — Progress Notes (Signed)
 Physical Therapy Session Note  Patient Details  Name: Danielle Flores MRN: [id number] Date of Birth: 01/16/1966  Today's Date: 06/07/2024 PT Individual Time: 9154-9079 PT Individual Time Calculation (min): 35 min  Today's Date: 06/07/2024 PT Missed Time: 10 Minutes Missed Time Reason: Patient fatigue  Short Term Goals: Week 1:  PT Short Term Goal 1 (Week 1): STG = LTG d/t ELOS  Skilled Therapeutic Interventions/Progress Updates: Patient supine in bed with family present on entrance to room. Patient alert and agreeable to PT session.   Patient reported no pain during session, only fatigue. Pt required increased time during rest breaks throughout session. Pt HR highest was 116 bpm and decreased after rest but mostly maintained around 105  bpm even at rest. Pt performed all bed mobility and transfers independently.   Therapeutic Exercise: Pt performed the following exercises with therapist providing the described cuing and facilitation for improvement. - Pt ambulated 150'+ x 3 with tidal tank in B UE's with supervision and cues to keep water level (rest break required prior to next circuit below) - B shoulder press with tidal tank 3 x 10 - Standing truncal rotation with green theraband and cues to keep B hands in line with center of chest out in front. 3 x 10 (R rotation and L rotation)   Patient supine in bed at end of session with brakes locked, and all needs within reach.      Therapy Documentation Precautions:  Precautions Precautions: Fall Precaution/Restrictions Comments: Monitor HR; dialysis T/TH/S Restrictions Weight Bearing Restrictions Per Provider Order: No   Therapy/Group: Individual Therapy  Taiwan Talcott PTA 06/07/2024, 9:25 AM

## 2024-06-08 DIAGNOSIS — D638 Anemia in other chronic diseases classified elsewhere: Secondary | ICD-10-CM | POA: Diagnosis not present

## 2024-06-08 DIAGNOSIS — N179 Acute kidney failure, unspecified: Secondary | ICD-10-CM | POA: Diagnosis not present

## 2024-06-08 DIAGNOSIS — R5381 Other malaise: Secondary | ICD-10-CM | POA: Diagnosis not present

## 2024-06-08 DIAGNOSIS — R739 Hyperglycemia, unspecified: Secondary | ICD-10-CM | POA: Diagnosis not present

## 2024-06-08 LAB — GLUCOSE, CAPILLARY
Glucose-Capillary: 129 mg/dL — ABNORMAL HIGH (ref 70–99)
Glucose-Capillary: 101 mg/dL — ABNORMAL HIGH (ref 70–99)
Glucose-Capillary: 124 mg/dL — ABNORMAL HIGH (ref 70–99)
Glucose-Capillary: 149 mg/dL — ABNORMAL HIGH (ref 70–99)

## 2024-06-08 NOTE — Plan of Care (Signed)
  Problem: Consults Goal: RH GENERAL PATIENT EDUCATION Description: See Patient Education module for education specifics. Outcome: Progressing Goal: Nutrition Consult-if indicated Outcome: Progressing Goal: Diabetes Guidelines if Diabetic/Glucose > 140 Description: If diabetic or lab glucose is > 140 mg/dl - Initiate Diabetes/Hyperglycemia Guidelines & Document Interventions  Outcome: Progressing   Problem: RH BOWEL ELIMINATION Goal: RH STG MANAGE BOWEL WITH ASSISTANCE Description: STG Manage Bowel with supervision Assistance. Outcome: Progressing   Problem: RH SKIN INTEGRITY Goal: RH STG SKIN FREE OF INFECTION/BREAKDOWN Description: Manage skin free of infection with supervision assistance Outcome: Progressing   Problem: RH SAFETY Goal: RH STG ADHERE TO SAFETY PRECAUTIONS W/ASSISTANCE/DEVICE Description: STG Adhere to Safety Precautions With supervision Assistance/Device. Outcome: Progressing   Problem: RH PAIN MANAGEMENT Goal: RH STG PAIN MANAGED AT OR BELOW PT'S PAIN GOAL Description: <4 w/ prns Outcome: Progressing   Problem: RH KNOWLEDGE DEFICIT GENERAL Goal: RH STG INCREASE KNOWLEDGE OF SELF CARE AFTER HOSPITALIZATION Description: Manage increase knowledge of self care after hospitalization with supervision assistance from spouse using educational materials provided  Outcome: Progressing   Problem: Education: Goal: Ability to describe self-care measures that may prevent or decrease complications (Diabetes Survival Skills Education) will improve Outcome: Progressing Goal: Individualized Educational Video(s) Outcome: Progressing   Problem: Coping: Goal: Ability to adjust to condition or change in health will improve Outcome: Progressing   Problem: Fluid Volume: Goal: Ability to maintain a balanced intake and output will improve Outcome: Progressing   Problem: Health Behavior/Discharge Planning: Goal: Ability to identify and utilize available resources and  services will improve Outcome: Progressing Goal: Ability to manage health-related needs will improve Outcome: Progressing   Problem: Metabolic: Goal: Ability to maintain appropriate glucose levels will improve Outcome: Progressing   Problem: Nutritional: Goal: Maintenance of adequate nutrition will improve Outcome: Progressing Goal: Progress toward achieving an optimal weight will improve Outcome: Progressing   Problem: Skin Integrity: Goal: Risk for impaired skin integrity will decrease Outcome: Progressing   Problem: Tissue Perfusion: Goal: Adequacy of tissue perfusion will improve Outcome: Progressing

## 2024-06-08 NOTE — Progress Notes (Signed)
 PROGRESS NOTE   Subjective/Complaints:  Pt doing well again, slept well, denies pain, LBM yesterday and this morning, urinating per her usual. No other complaints or concerns. States dialysis is on hold for now.   ROS- neg CP, SOB, abd pain, n/v/d/c  Objective:   No results found.  No results for input(s): WBC, HGB, HCT, PLT in the last 72 hours.  Recent Labs    06/06/24 0506 06/07/24 0634  NA 136 136  K 3.7 3.6  CL 95* 98  CO2 22 21*  GLUCOSE 115* 120*  BUN 42* 62*  CREATININE 7.27* 7.52*  CALCIUM  8.7* 8.8*    Intake/Output Summary (Last 24 hours) at 06/08/2024 1051 Last data filed at 06/08/2024 0600 Gross per 24 hour  Intake 712 ml  Output 1400 ml  Net -688 ml        Physical Exam: Vital Signs Blood pressure (!) 142/80, pulse 88, temperature 98.7 F (37.1 C), temperature source Oral, resp. rate 19, height 5' 5 (1.651 m), weight 87 kg, SpO2 98%.  General: No acute distress, resting comfortably in bed.  Mood and affect are flat Heart: Regular rate and rhythm no rubs murmurs or extra sounds, dialysis catheter R chest c/d/i Lungs: Clear to auscultation, breathing unlabored, no rales or wheezes Abdomen: Positive bowel sounds, soft nontender to palpation, nondistended Extremities: No clubbing, cyanosis, or edema Skin: No evidence of breakdown, no evidence of rash, Right upper chest HD cath CDI , no erythema   PRIOR EXAMS: Neurologic: Cranial nerves II through XII intact, motor strength is 5/5 in bilateral deltoid, bicep, tricep, grip, hip flexor, knee extensors, ankle dorsiflexor and plantar flexor Sensory exam reduced sensation to light touch left tip of middle finger Cerebellar exam normal finger to nose to finger as well as heel to shin in bilateral upper and lower extremities Musculoskeletal: Full range of motion in all 4 extremities. No joint swelling    Assessment/Plan: 1. Functional  deficits which require 3+ hours per day of interdisciplinary therapy in a comprehensive inpatient rehab setting. Physiatrist is providing close team supervision and 24 hour management of active medical problems listed below. Physiatrist and rehab team continue to assess barriers to discharge/monitor patient progress toward functional and medical goals  Care Tool:  Bathing    Body parts bathed by patient: Right arm, Left arm, Chest, Abdomen, Front perineal area, Buttocks, Face, Left lower leg, Right lower leg, Left upper leg, Right upper leg         Bathing assist Assist Level: Supervision/Verbal cueing     Upper Body Dressing/Undressing Upper body dressing   What is the patient wearing?: Pull over shirt    Upper body assist Assist Level: Supervision/Verbal cueing    Lower Body Dressing/Undressing Lower body dressing      What is the patient wearing?: Pants, Underwear/pull up     Lower body assist Assist for lower body dressing: Supervision/Verbal cueing     Toileting Toileting    Toileting assist Assist for toileting: Supervision/Verbal cueing     Transfers Chair/bed transfer  Transfers assist     Chair/bed transfer assist level: Independent     Locomotion Ambulation   Ambulation assist  Assist level: Supervision/Verbal cueing Assistive device: No Device Max distance: 632'   Walk 10 feet activity   Assist     Assist level: Supervision/Verbal cueing Assistive device: No Device   Walk 50 feet activity   Assist    Assist level: Supervision/Verbal cueing Assistive device: No Device    Walk 150 feet activity   Assist    Assist level: Supervision/Verbal cueing Assistive device: No Device    Walk 10 feet on uneven surface  activity   Assist Walk 10 feet on uneven surfaces activity did not occur: Safety/medical concerns (d/t fatigue)         Wheelchair     Assist Is the patient using a wheelchair?: Yes Type of  Wheelchair: Manual    Wheelchair assist level: Independent Max wheelchair distance: 150'    Wheelchair 50 feet with 2 turns activity    Assist        Assist Level: Independent   Wheelchair 150 feet activity     Assist      Assist Level: Independent   Blood pressure (!) 142/80, pulse 88, temperature 98.7 F (37.1 C), temperature source Oral, resp. rate 19, height 5' 5 (1.651 m), weight 87 kg, SpO2 98%.  Medical Problem List and Plan: 1. Functional deficits- debility secondary to sepsis due to  pneumonia             -patient may shower             -ELOS/Goals: 8-12 days S             Admit to CIR   2.  Antithrombotics: -DVT/anticoagulation:  Pharmaceutical: Heparin  5kU q8h             -antiplatelet therapy: Plavix  daily 3. Pain Management: Tylenol prn.  4. Mood/Behavior/Sleep: LCSW to follow for evaluation and support.              -antipsychotic agents: N/A 5. Neuropsych/cognition: This patient is capable of making decisions on her own behalf. 6. Skin/Wound Care: Routine pressure measures. 7. Fluids/Electrolytes/Nutrition: Strict I/O. Daily weights. Continue vitamins/supplements   -06/07/24 labs stable 8.   Septic shock due to multifocal PNA: Has completed Unasyn-->doxycycline thru 10/21.             --diarrhea has resolved.    9.  T2DM poorly controlled: Hgb A1c-9.9. Monitor BS ac/hs and use SSI for elevated BS. Off metformin . Used 70/30 insulin  bid PTA.               --continue Insulin  glargine 22 units with 8 units novolog  tid ac  -06/07/24 CBGs pretty well controlled, monitor -OF NOTE: patient received 22u lantus  last night around 11:45pm accidentally by nursing, nursing reporting that she hadn't been getting her 8u mealtime novolog  much of the day d/t lower CBGs earlier? CBG prior to administration of erroneous Lantus  was 131. This was reported to me and discussed around 12:20am-- pt was still awake. I instructed nursing to give her an Ensure for extra  carb/protein, recheck CBG at 3am and as long as it was ok, recheck at 5:30am, then HOLD 8am Lantus  since this was given at basically midnight. CBG at 3:30 was 213 as anticipated, 0620 CBG was 118. Anticipate CBGs will be off through tomorrow but hopefully will be able to see return to trend by Monday. -06/08/24 CBGs great overnight, hasn't been getting her 8U novolog -- might be able to d/c this if continued control without it-- monitor through tomorrow to see trend now that  she's back on her normal lantus  schedule as well.   CBG (last 3)  Recent Labs    06/07/24 1711 06/07/24 2136 06/08/24 0702  GLUCAP 119* 138* 129*     10. AKI due to septic shock: HD dependent at this time. Schedule dialysis at the end of the day to help with tolerance of therapy             --Strict I/O. Daily weights.              --continue Auyrxia for elevated phos.   -06/08/24 per nephro, holding dialysis for now.    11. Anemia of acute illness: Treated with Venofer and Aranesp. Initial Hgb at admit was 14gm, check stool OB --Hgb  improved from 6.8-->7.4--> 7.2- stable will check stool OB, results not available yet      Latest Ref Rng & Units 06/04/2024    2:50 AM 06/03/2024    8:45 AM 06/02/2024    3:29 AM  CBC  WBC 4.0 - 10.5 K/uL 7.4  7.8  6.6   Hemoglobin 12.0 - 15.0 g/dL 7.2  7.4  7.0   Hematocrit 36.0 - 46.0 % 21.9  22.3  21.3   Platelets 150 - 400 K/uL 352  379  382     12. CAD: Continue plavix . Metoprolol  50mg  BID resumed on 10/29 due to sinus tachycardia. Tachy may be related to anemia   13. Abnormal LFTs: Likely due to shocked liver. Improved 10/30     14. HLD: continue zetia  10mg  daily    LOS: 4 days A FACE TO FACE EVALUATION WAS PERFORMED  [address] 06/08/2024, 10:51 AM

## 2024-06-08 NOTE — Progress Notes (Signed)
 Physical Therapy Session Note  Patient Details  Name: Danielle Flores MRN: [id number] Date of Birth: 07/02/1966  Today's Date: 06/08/2024 PT Individual Time: 0900-0930  PT Individual Time Calculation (min): 30 min  Short Term Goals: Week 1:  PT Short Term Goal 1 (Week 1): STG = LTG d/t ELOS  Skilled Therapeutic Interventions/Progress Updates:  Chart reviewed. Pt received semi-reclined in bed with 0/10 c/o pain. Session focused on functional transfers, balance, and ambulation to promote safe home mobility and access. Pt initially confused about plan for therapy, and PT explained schedule change to patient due to therapist call out. Pt agreeable to limited session due to fatigue. Pt initiated session with amb around room to prepare for self-care using S. Pt then stood at sink for UB bathing and dressing. Pt completed all items with S + no AD + no LOB. Pt then reported high fatigue and plan to rest for the day. PT offered to take pt outdoors, but pt politely declining and requesting to rest. PT explained need to make up missed therapy time, with pt agreeable and still politely declining further therapy 2/2 fatigue. At end of session, pt was left semi-reclined in bed with family present, nurse call bell and all needs in reach. Family member cleared for supporting amb in room with documentation on safety plan and family verbalizing understanding to let nursing know before leaving the room, so no alarm left on patient at this time.     Therapy Documentation Precautions:  Precautions Precautions: Fall Precaution/Restrictions Comments: Monitor HR; dialysis T/TH/S Restrictions Weight Bearing Restrictions Per Provider Order: No General: PT Amount of Missed Time (min): 30 Minutes PT Missed Treatment Reason: Patient fatigue    Therapy/Group: Individual Therapy   [name] 06/08/2024, 9:47 AM

## 2024-06-08 NOTE — Progress Notes (Signed)
 [facility] Progress Note    Assessment/ Plan:    AKI - in setting of DKA & ischemic ATN in setting of volume depletion and acute illness as well as hypotension. Baseline Cr ~1. UA with gluc, blood, and protein.  Negative nitrite and renal US  without obstruction. S/p CRRT via temp line from 10/14-10/16/25. Transitioned to HD on 10/18  Appreciate VIR placing the right IJ [facility] 10/27 and removing the temp cath UOP increasing nicely and I anticipate improvement in renal function soon; no need to check labs today. Will recheck tomorrow. Continue holding HD for now; anticipate stabilization and recovery in next few days. She knows the hope is to liberalize her from dialysis but may take longer than expected.  Will continue to monitor for renal recovery.  I did give her a heads up that she may be leaving the hospital on dialysis and she is understanding of this.  Avoid nephrotoxic medications including NSAIDs and iodinated intravenous contrast exposure unless the latter is absolutely indicated.  Preferred narcotic agents for pain control are hydromorphone, fentanyl , and methadone. Morphine should not be used. Avoid Baclofen and avoid oral sodium phosphate and magnesium citrate based laxatives / bowel preps. Continue strict Input and Output monitoring. Will monitor the patient closely with you and intervene or adjust therapy as indicated by changes in clinical status/labs   Severe sepsis with septic shock due to RML pneumonia -s/p doxycycline.  Per primary service Hematuria: UA pending (collect when able), renal u/s 10/21 unrevealing, w/u per primary service Acute hypoxic respiratory failure - improved  Acute NSTEMI - on plavix  and being followed by Cardiology. Hyponatremia - due to DKA and AKI, will attempt to manage with HD Hypokalemia - due to DKA. resolved DKA - off insulin  drip per primary service Abnormal LFT's - likely shock liver.  Per primary Thrombocytopenia - due to acute  critical illness--resolved AGMA - due to #1 and #7.  Metformin  stopped.  Improved with renal replacement therapy  Anemia - likely due to critical illness and AKI.  She will not accept blood transfusions ([employer]).  ESA dose increased for 10/25.  Receiving IV iron since she is off antibiotics.  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 d/w RD. Will recheck phos on Fr -> 8.7 -> recheck Tues. Upset her stomach and will try 2 tabs but spaced out through the meal Severe protein malnutrition - alb low.  protein supplements per RD.  In CIR and tired but tolerating; will start CLIP process for AKI if not recovered when she's closer to d/c.[facility]   Subjective:   She reports good UOP and finally .4L /24hrs. Denies fever, chills, nausea, shortness of breath  Spouse bedside updated.    Objective:   BP (!) 142/80 (BP Location: Left Arm)   Pulse 88   Temp 98.7 F (37.1 C) (Oral)   Resp 19   Ht 5' 5 (1.651 m)   Wt 87 kg   LMP  (LMP Unknown)   SpO2 98%   BMI 31.92 kg/m   Intake/Output Summary (Last 24 hours) at 06/08/2024 1020 Last data filed at 06/08/2024 0600 Gross per 24 hour  Intake 712 ml  Output 1400 ml  Net -688 ml    Weight change:   Physical Exam: Gen: NAD, laying flat in bed CVS: RRR Resp: CTA B/L, normal wob on RA Jai:dnqu, nt/nd Ext: no edema Neuro: awake, alert  Dialysis access: RIJ tunneled HD catheter c/d/i  Imaging: No results found.     Labs: BMET Recent Labs  Lab 06/02/24 0329 06/03/24 0845 06/04/24 0250 06/05/24 1821 06/06/24 0506 06/07/24 0634  NA 137 138 137 134* 136 136  K 4.1 4.1 4.2 3.7 3.7 3.6  CL 98 100 100 94* 95* 98  CO2 21* 20* 18* 23 22 21*  GLUCOSE 114* 216* 93 154* 115* 120*  BUN 39* 49* 56* 40* 42* 62*  CREATININE 7.27* 9.24* 9.75* 6.94* 7.27* 7.52*  CALCIUM  8.2* 8.3* 8.7* 8.5* 8.7* 8.8*  PHOS 8.8* 10.1* 10.1* 7.2* 8.0*  --     CBC Recent Labs  Lab 06/02/24 0329 06/03/24 0845 06/04/24 0250  WBC 6.6 7.8 7.4  NEUTROABS 4.5 6.3 5.5  HGB 7.0* 7.4* 7.2*  HCT 21.3* 22.3* 21.9*  MCV 74.7* 75.1* 76.6*  PLT 382 379 352    Medications:     (feeding supplement) PROSource Plus  30 mL Oral Daily   Chlorhexidine Gluconate Cloth  6 each Topical BID   clopidogrel   75 mg Oral Daily   darbepoetin (ARANESP) injection - DIALYSIS  150 mcg Subcutaneous Q Sun-1800   ezetimibe   10 mg Oral Daily   feeding supplement (NEPRO CARB STEADY)  237 mL Oral BID BM   ferric citrate  420 mg Oral TID WC   Gerhardt's butt cream   Topical Daily   heparin   5,000 Units Subcutaneous Q8H   insulin  aspart  0-5 Units Subcutaneous QHS   insulin  aspart  0-9 Units Subcutaneous TID WC   insulin  aspart  8 Units Subcutaneous TID WC   insulin  glargine-yfgn  22 Units Subcutaneous Daily   metoprolol  tartrate  50 mg Oral BID   multivitamin  1 tablet Oral QHS

## 2024-06-09 DIAGNOSIS — R5381 Other malaise: Secondary | ICD-10-CM | POA: Diagnosis not present

## 2024-06-09 DIAGNOSIS — D62 Acute posthemorrhagic anemia: Secondary | ICD-10-CM | POA: Diagnosis not present

## 2024-06-09 DIAGNOSIS — N170 Acute kidney failure with tubular necrosis: Secondary | ICD-10-CM | POA: Diagnosis not present

## 2024-06-09 LAB — GLUCOSE, CAPILLARY
Glucose-Capillary: 96 mg/dL (ref 70–99)
Glucose-Capillary: 106 mg/dL — ABNORMAL HIGH (ref 70–99)
Glucose-Capillary: 152 mg/dL — ABNORMAL HIGH (ref 70–99)
Glucose-Capillary: 170 mg/dL — ABNORMAL HIGH (ref 70–99)

## 2024-06-09 LAB — BASIC METABOLIC PANEL WITH GFR
Anion gap: 17 — ABNORMAL HIGH (ref 5–15)
BUN: 73 mg/dL — ABNORMAL HIGH (ref 6–20)
CO2: 21 mmol/L — ABNORMAL LOW (ref 22–32)
Calcium: 9.2 mg/dL (ref 8.9–10.3)
Chloride: 102 mmol/L (ref 98–111)
Creatinine, Ser: 6.6 mg/dL — ABNORMAL HIGH (ref 0.44–1.00)
GFR, Estimated: 7 mL/min — ABNORMAL LOW (ref >60)
Glucose, Bld: 125 mg/dL — ABNORMAL HIGH (ref 70–99)
Potassium: 3.6 mmol/L (ref 3.5–5.1)
Sodium: 140 mmol/L (ref 135–145)

## 2024-06-09 MED ORDER — INSULIN GLARGINE-YFGN 100 UNIT/ML ~~LOC~~ SOLN
20.0000 [IU] | Freq: Every day | SUBCUTANEOUS | Status: DC
  Administered 2024-06-10 – 2024-06-11 (×2): 20 [IU] via SUBCUTANEOUS
  Filled 2024-06-09 (×2): qty 0.2

## 2024-06-09 MED ORDER — INFLUENZA VIRUS VACC SPLIT PF (FLUZONE) 0.5 ML IM SUSY
0.5000 mL | PREFILLED_SYRINGE | INTRAMUSCULAR | Status: AC
  Administered 2024-06-10: 0.5 mL via INTRAMUSCULAR
  Filled 2024-06-09: qty 0.5

## 2024-06-09 MED ORDER — PNEUMOCOCCAL 20-VAL CONJ VACC 0.5 ML IM SUSY
0.5000 mL | PREFILLED_SYRINGE | INTRAMUSCULAR | Status: AC
  Administered 2024-06-09: 0.5 mL via INTRAMUSCULAR
  Filled 2024-06-09: qty 0.5

## 2024-06-09 MED ORDER — IRON SUCROSE 200 MG IVPB - SIMPLE MED
200.0000 mg | Freq: Once | Status: DC
  Filled 2024-06-09 (×2): qty 110

## 2024-06-09 MED ORDER — SODIUM CHLORIDE 0.9 % IV SOLN
200.0000 mg | Freq: Once | INTRAVENOUS | Status: AC
  Administered 2024-06-09: 200 mg via INTRAVENOUS
  Filled 2024-06-09: qty 10

## 2024-06-09 NOTE — Consult Note (Signed)
 Neuropsychological Consultation Comprehensive Inpatient Rehab   Patient:   Danielle Flores   DOB:   09-Mar-1966  MR Number:  [id number]  Location:  [facility] [address] [place] [address] Dept: [phone] Loc: [phone]           Date of Service:   05/09/2024  Start Time:   9 AM End Time:   10 AM  Provider/Observer:  Norleen Asa, Psy.D.       Clinical Neuropsychologist       Billing Code/Service: [phone]  Reason for Service:    Danielle Flores is a 58 year old female admitted to the comprehensive inpatient rehabilitation (CIR) unit.  Patient was referred for neuropsychological consultation to address issues with coping and adjustment challenges following a complex medical admission and significant functional decline/debility.  HISTORY OF PRESENT ILLNESS: Admitted on 05/18/2024 with URI-type symptoms, nausea, vomiting, and generalized weakness. Workup revealed NSTEMI, DKA, sepsis secondary to right middle lobe pneumonia, and acute kidney injury (AKI). Was febrile (102.110F), tachycardic, and short of breath. Hospital course was complicated by worsening hypoxia requiring BiPAP and diuretics. AKI, attributed to septic shock with ATN, progressed to requiring hemodialysis. Cardiology attributed elevated troponins to type II MI/demand ischemia in the setting of infection and tachycardia; no further cardiac evaluation was deemed necessary. Anemia of critical illness was treated with ESA. Physical and occupational therapy evaluation noted significant deconditioning with heart rate in the 140s with activity, prompting recommendation for CIR.  PAST MEDICAL HISTORY: Coronary Artery Disease (CAD), s/p RCA stent (09/16/2018) Type 2 Diabetes Mellitus (T2DM), noted to be approaching brittle state Hypertension (HTN) Hyperlipidemia Sepsis (05/18/2024) Diabetic Ketoacidosis (DKA) (05/18/2024) Acute Kidney Injury  (AKI) (05/18/2024), requiring hemodialysis since 05/20/2024  PAST SURGICAL HISTORY: Left heart catheterization and coronary angiography (09/16/2018) Coronary stent intervention (09/16/2018) Cesarean section (date unknown) IR tunneled central venous catheter placement (06/02/2024)  MEDICATIONS: Trazodone at night for sleep. Other medications not specified.  BEHAVIORAL OBSERVATIONS AND MENTAL STATUS: Mood reported as good. Reports anxiety, particularly when her husband is not present. She has never been alone before. Appeared engaged and receptive during the consultation. Acknowledges the need for rehabilitation to improve but also expresses fatigue.  ASSESSMENT/IMPRESSION: Danielle Flores is a 58 year old female with a complex medical history, admitted for inpatient rehabilitation following a prolonged hospitalization for sepsis, DKA, and multi-organ dysfunction. She is experiencing a significant functional decline and deconditioning. Cognitively, she is oriented. Emotionally, she is dealing with adjustment-related distress and anxiety, which appear linked to her hospitalization, physical limitations, and separation from her husband. She demonstrates an understanding of her current medical situation and the purpose of rehabilitation.  PLAN/RECOMMENDATIONS: 1. Provided extensive psychoeducation regarding the purpose of the CIR unit, the process of rehabilitation, and the recovery trajectory after critical illness. Explained the concepts of deconditioning and debility, and the importance of active participation to regain strength and function. 2. Addressed anxiety related to being alone. Provided reassurance regarding the high level of monitoring on the unit. 3. Introduced cognitive reframing techniques for anxious thoughts, specifically the use of the phrase right now to contextualize her current state as temporary and not predictive of her future abilities. 4. Reinforced that her admission to  the CIR unit is an indicator of her potential for significant recovery. 5. Emphasized the multidisciplinary team approach to her care and the plan for follow-up appointments post-discharge to ensure continuity of care. 6. Discussed the importance of fall precautions and hospital safety  measures. 7. Will continue to follow during her CIR stay to monitor mood, coping, and adjustment.    Diagnosis:      Anxiety state  Debility         Electronically Signed   _______________________ Norleen Asa, Psy.D. Clinical Neuropsychologist

## 2024-06-09 NOTE — Progress Notes (Signed)
 Occupational Therapy Session Note  Patient Details  Name: [name] MRN: [id number] Date of Birth: 1965-10-06  Today's Date: 06/09/2024 OT Individual Time: 1010-1040 OT Individual Time Calculation (min): 30 min  and Today's Date: 06/09/2024 OT Missed Time: 30 Minutes Missed Time Reason: Patient fatigue   Short Term Goals: Week 1:  OT Short Term Goal 1 (Week 1): STGs = LTGs  Skilled Therapeutic Interventions/Progress Updates:    Pt received in bed stating she had completed all of her self care this am independently.  She is possibly getting the HD port removed today and I suggested she could shower here in the afternoon with PM therapist.  I  am not showering here, will do that at home, just will sponge bathe here  Pt stated she was exhausted from PT and did not want to participate but was able her to encourage her to at least try.   Asked her to try to demonstrate mobility,reaching and activity tolerance for meal prep and basic housekeeping activities as she will be home alone with spouse at work. Pt declined walking to ADL apt stating she had done enough walking for the day.  She did self propel w/c herself,  to apartment.    In kitchen, pt demonstrated the ability to reach into all cabinents, lift heavy pots and trays,  reach into refrigerator,  swept floor.  Pt able to stand and move around for several minutes at a time before taking a rest break. Pt stated she has barstools in her kitchen she can sit on to rest during meal prep activities.  Discussed energy conservation strategies during meal prep activity.  Self propelled to gym.  Had pt ambulate from door of gym to mat.  Engaged pt in light arm exercises using a gym ball.  Pt kept stating she was tired. I am anemic and do not have much energy.  After 30 minutes, pt requested to go back to her room.   Pt self propelled to room and then ambulated to couch to sit on.   Due to pt's strong balance, mobility, and cognitive skills,  pt is now independent in the room.    Therapy Documentation Precautions:  Precautions Precautions: Fall Precaution/Restrictions Comments: Monitor HR; dialysis T/TH/S Restrictions Weight Bearing Restrictions Per Provider Order: No General: General OT Amount of Missed Time: 30 Minutes    Pain:  No c/o pain  ADL: ADL ADL Comments: independent with all tasks. will use shower seat at home.     Therapy/Group: Individual Therapy  Teliyah Royal 06/09/2024, 10:59 AM

## 2024-06-09 NOTE — Progress Notes (Signed)
 Occupational Therapy Session Note  Patient Details  Name: Danielle Flores MRN: [id number] Date of Birth: 10-Jan-1966  Today's Date: 06/09/2024 OT Individual Time: 1500-1525 OT Individual Time Calculation (min): 25 min    Short Term Goals: Week 1:  OT Short Term Goal 1 (Week 1): STGs = LTGs  Skilled Therapeutic Interventions/Progress Updates:      Therapy Documentation Precautions:  Precautions Precautions: Fall Precaution/Restrictions Comments: Monitor HR; dialysis T/TH/S Restrictions Weight Bearing Restrictions Per Provider Order: No Session 1 Pt seated in recliner upon OT arrival, agreeable to OT.  Pain: no pain reported  Exercises: Pt completed the following exercise circuit in order to improve functional activity, strength and endurance to prepare for ADLs such as bathing. Pt completed the following exercises in seated position with no noted LOB/SOB and 1x10 repetitions on each exercise: -bicep curls -triceps extensions -shoulder abduction -forward punches   Other Treatments: OT educating pt on TTB for shower at home for increased energy conservation when bathing.    Pt seated in recliner at end of session with call light within reach and 4Ps assessed.    Session 2  Pt seated in W/C upon OT arrival, agreeable to OT.  Pain:no pain reported  Exercises: Pt completed the following exercise circuit in order to improve functional activity, strength and endurance to prepare for ADLs such as bathing. Pt completed the following exercises in seated position with no noted LOB/SOB and 3x10 repetitions with green theraband on each exercise: -resisted clam shells  -resisted leg press -resisted knee flexion  Other Treatments: Pt reporting overall feeling good about responsibilities at home and D/C. OT re-iterating importance of energy conservation strategies and pacing during activities.    Pt seated in recliner at end of session with call light within reach and 4Ps assessed.     Therapy/Group: Individual Therapy  Camie Hoe, OTD, OTR/L 06/09/2024, 3:27 PM

## 2024-06-09 NOTE — Progress Notes (Signed)
 PROGRESS NOTE   Subjective/Complaints:  No issues , discussed need for stool sample, have messaged RN, pt has not had stool x 2 d Pt states Nephro has indicated no need for OP HD, per last note from Dr Melia, holding HD and monitoring labs will reach out to see if OP HD will be needed given anticipated D/C on 11/5 ROS- neg CP, SOB, abd pain, n/v/d/c  Objective:   No results found.  No results for input(s): WBC, HGB, HCT, PLT in the last 72 hours.  Recent Labs    06/07/24 0634  NA 136  K 3.6  CL 98  CO2 21*  GLUCOSE 120*  BUN 62*  CREATININE 7.52*  CALCIUM  8.8*    Intake/Output Summary (Last 24 hours) at 06/09/2024 0810 Last data filed at 06/09/2024 0640 Gross per 24 hour  Intake 472 ml  Output 1550 ml  Net -1078 ml        Physical Exam: Vital Signs Blood pressure 134/77, pulse 88, temperature 97.8 F (36.6 C), resp. rate 18, height 5' 5 (1.651 m), weight 86.7 kg, SpO2 96%.  General: No acute distress, resting comfortably in bed.  Mood and affect are flat Heart: Regular rate and rhythm no rubs murmurs or extra sounds, dialysis catheter R chest c/d/i Lungs: Clear to auscultation, breathing unlabored, no rales or wheezes Abdomen: Positive bowel sounds, soft nontender to palpation, nondistended Extremities: No clubbing, cyanosis, or edema Skin: No evidence of breakdown, no evidence of rash, Right upper chest HD cath CDI , no erythema   PRIOR EXAMS: Neurologic: Cranial nerves II through XII intact, motor strength is 5/5 in bilateral deltoid, bicep, tricep, grip, hip flexor, knee extensors, ankle dorsiflexor and plantar flexor Sensory exam reduced sensation to light touch left tip of middle finger Cerebellar exam normal finger to nose to finger as well as heel to shin in bilateral upper and lower extremities Musculoskeletal: Full range of motion in all 4 extremities. No joint  swelling    Assessment/Plan: 1. Functional deficits which require 3+ hours per day of interdisciplinary therapy in a comprehensive inpatient rehab setting. Physiatrist is providing close team supervision and 24 hour management of active medical problems listed below. Physiatrist and rehab team continue to assess barriers to discharge/monitor patient progress toward functional and medical goals  Care Tool:  Bathing    Body parts bathed by patient: Right arm, Left arm, Chest, Abdomen, Front perineal area, Buttocks, Face, Left lower leg, Right lower leg, Left upper leg, Right upper leg         Bathing assist Assist Level: Supervision/Verbal cueing     Upper Body Dressing/Undressing Upper body dressing   What is the patient wearing?: Pull over shirt    Upper body assist Assist Level: Supervision/Verbal cueing    Lower Body Dressing/Undressing Lower body dressing      What is the patient wearing?: Pants, Underwear/pull up     Lower body assist Assist for lower body dressing: Supervision/Verbal cueing     Toileting Toileting    Toileting assist Assist for toileting: Supervision/Verbal cueing     Transfers Chair/bed transfer  Transfers assist     Chair/bed transfer assist level: Independent  Locomotion Ambulation   Ambulation assist      Assist level: Supervision/Verbal cueing Assistive device: No Device Max distance: 632'   Walk 10 feet activity   Assist     Assist level: Supervision/Verbal cueing Assistive device: No Device   Walk 50 feet activity   Assist    Assist level: Supervision/Verbal cueing Assistive device: No Device    Walk 150 feet activity   Assist    Assist level: Supervision/Verbal cueing Assistive device: No Device    Walk 10 feet on uneven surface  activity   Assist Walk 10 feet on uneven surfaces activity did not occur: Safety/medical concerns (d/t fatigue)         Wheelchair     Assist Is the  patient using a wheelchair?: Yes Type of Wheelchair: Manual    Wheelchair assist level: Independent Max wheelchair distance: 150'    Wheelchair 50 feet with 2 turns activity    Assist        Assist Level: Independent   Wheelchair 150 feet activity     Assist      Assist Level: Independent   Blood pressure 134/77, pulse 88, temperature 97.8 F (36.6 C), resp. rate 18, height 5' 5 (1.651 m), weight 86.7 kg, SpO2 96%.  Medical Problem List and Plan: 1. Functional deficits- debility secondary to sepsis due to  pneumonia             -patient may shower             -ELOS/Goals: 8-12 days S            2.  Antithrombotics: -DVT/anticoagulation:  Pharmaceutical: Heparin  5kU q8h- d/c amb > 100' with PT             -antiplatelet therapy: Plavix  daily 3. Pain Management: Tylenol prn.  4. Mood/Behavior/Sleep: LCSW to follow for evaluation and support.              -antipsychotic agents: N/A 5. Neuropsych/cognition: This patient is capable of making decisions on her own behalf. 6. Skin/Wound Care: Routine pressure measures. 7. Fluids/Electrolytes/Nutrition: Strict I/O. Daily weights. Continue vitamins/supplements   -06/07/24 labs stable 8.   Septic shock due to multifocal PNA: Has completed Unasyn-->doxycycline thru 10/21.             --diarrhea has resolved.    9.  T2DM poorly controlled: Hgb A1c-9.9. Monitor BS ac/hs and use SSI for elevated BS. Off metformin . Used 70/30 insulin  bid PTA.               --continue Insulin  glargine 22 units with 8 units novolog  tid ac  -06/09/24 CBGs great overnight, hasn't been getting her 8U novolog -- will d/c , also reduce semglee  to 20U   CBG (last 3)  Recent Labs    06/08/24 1649 06/08/24 2055 06/09/24 0535  GLUCAP 124* 149* 96     10. AKI due to septic shock: HD dependent at this time. Schedule dialysis at the end of the day to help with tolerance of therapy             --Strict I/O. Daily weights.              --continue  Auyrxia for elevated phos.   -06/08/24 per nephro, holding dialysis for now.     Latest Ref Rng & Units 06/07/2024    6:34 AM 06/06/2024    5:06 AM 06/05/2024    6:21 PM  BMP  Glucose 70 - 99 mg/dL 879  115  154   BUN 6 - 20 mg/dL 62  42  40   Creatinine 0.44 - 1.00 mg/dL 2.47  2.72  3.05   Sodium 135 - 145 mmol/L 136  136  134   Potassium 3.5 - 5.1 mmol/L 3.6  3.7  3.7   Chloride 98 - 111 mmol/L 98  95  94   CO2 22 - 32 mmol/L 21  22  23    Calcium  8.9 - 10.3 mg/dL 8.8  8.7  8.5    Creat remains up - will reach out to Nephro to confirm    11. Anemia of acute illness: Treated with Venofer and Aranesp. Initial Hgb at admit was 14gm, check stool OB --Hgb  improved from 6.8-->7.4--> 7.2- stable will check stool OB, results not available yet      Latest Ref Rng & Units 06/04/2024    2:50 AM 06/03/2024    8:45 AM 06/02/2024    3:29 AM  CBC  WBC 4.0 - 10.5 K/uL 7.4  7.8  6.6   Hemoglobin 12.0 - 15.0 g/dL 7.2  7.4  7.0   Hematocrit 36.0 - 46.0 % 21.9  22.3  21.3   Platelets 150 - 400 K/uL 352  379  382     12. CAD: Continue plavix . Metoprolol  50mg  BID resumed on 10/29 due to sinus tachycardia. Tachy may be related to anemia   13. Abnormal LFTs: Likely due to shocked liver. Improved 10/30     14. HLD: continue zetia  10mg  daily  15.  Constipation LBM 11/1  LOS: 5 days A FACE TO FACE EVALUATION WAS PERFORMED  Danielle Flores 06/09/2024, 8:10 AM

## 2024-06-09 NOTE — Discharge Summary (Shared)
 Physician Discharge Summary  Patient ID: Danielle Flores MRN: [id number] DOB/AGE: [birth year] 58 y.o.  Admit date: 06/04/2024 Discharge date: 06/11/2024  Discharge Diagnoses:  Principal Problem:   Debility Active Problems:   Malnutrition of moderate degree Sepsis due to pneumonia Diabetes mellitus AKI due to septic shock Anemia of chronic disease CAD/acute non-STEMI Elevated troponins. Abnormal LFTs Hyperlipidemia Constipation  Discharged Condition: Stable  Significant Diagnostic Studies: DG Abd 1 View Result Date: 06/05/2024 CLINICAL DATA:  [id number] Abdominal discomfort [id number] EXAM: ABDOMEN - 1 VIEW COMPARISON:  Abdominopelvic CT 05/19/2024 FINDINGS: 1032 hours. Two supine portable views of the abdomen are submitted. There is a normal nonobstructive bowel gas pattern. No supine evidence of pneumoperitoneum. Small calcified uterine fibroids noted. No suspicious abdominal calcifications or acute osseous findings. The aeration of the lung bases has improved from the previous CT. IMPRESSION: No evidence of acute abdominal process. Improved aeration of the lung bases. Electronically Signed   By: Elsie Perone M.D.   On: 06/05/2024 14:29   IR TUNNELED CENTRAL VENOUS CATH [facility] W IMG Result Date: 06/02/2024 INDICATION: Acute kidney injury. Patient has a temporary dialysis catheter and needs a tunneled dialysis catheter. Temporary dialysis catheter was removed prior to this procedure. EXAM: FLUOROSCOPIC AND ULTRASOUND GUIDED PLACEMENT OF A TUNNELED DIALYSIS CATHETER Physician: Juliene SAUNDERS. Philip, MD MEDICATIONS: Ancef 2 g; The antibiotic was administered within an appropriate time interval prior to skin puncture. ANESTHESIA/SEDATION: Moderate (conscious) sedation was employed during this procedure. A total of Versed  1.5 mg and fentanyl  50 mcg was administered intravenously at the order of the provider performing the procedure. Total intra-service moderate sedation time: 21 minutes. Patient's level of  consciousness and vital signs were monitored continuously by radiology nurse throughout the procedure under the supervision of the provider performing the procedure. FLUOROSCOPY TIME:  Radiation Exposure Index (as provided by the fluoroscopic device): 5 mGy Kerma COMPLICATIONS: None immediate. PROCEDURE: Informed consent was obtained for placement of a tunneled dialysis catheter. The patient was placed supine on the interventional table. Ultrasound confirmed a patent right internal jugular vein. Ultrasound image obtained for documentation. The right neck and chest was prepped and draped in a sterile fashion. Maximal barrier sterile technique was utilized including caps, mask, sterile gowns, sterile gloves, sterile drape, hand hygiene and skin antiseptic. The right neck was anesthetized with 1% lidocaine . A small incision was made with #11 blade scalpel. A 21 gauge needle directed into the right internal jugular vein with ultrasound guidance. A micropuncture dilator set was placed. A 19 cm tip to cuff Palindrome catheter was selected. The skin below the right clavicle was anesthetized and a small incision was made with an #11 blade scalpel. A subcutaneous tunnel was formed to the vein dermatotomy site. The catheter was brought through the tunnel. The vein dermatotomy site was dilated to accommodate a peel-away sheath over a wire. The catheter was placed through the peel-away sheath and directed into the central venous structures. The tip of the catheter was placed at superior cavoatrial junction with fluoroscopy. Fluoroscopic images were obtained for documentation. Both lumens were found to aspirate and flush well. The proper amount of heparin  was flushed in both lumens. The vein dermatotomy site was closed using a single layer of absorbable suture and Dermabond. Gel-Foam placed in the subcutaneous tract. The catheter was secured to the skin using Prolene suture. IMPRESSION: Successful placement of a right jugular  tunneled dialysis catheter using ultrasound and fluoroscopic guidance. Electronically Signed   By: Juliene Philip M.D.   On:  06/02/2024 10:49   US  RENAL Result Date: 05/27/2024 CLINICAL DATA:  Hematuria EXAM: RENAL / URINARY TRACT ULTRASOUND COMPLETE COMPARISON:  05/18/2024 FINDINGS: Right Kidney: Renal measurements: 11.8 x 5.5 x 4.5 cm. = volume: 153 mL. No mass lesion or hydronephrosis is noted. 1.8 cm cyst is again seen in the midportion of the right kidney better visualized on the current exam. Left Kidney: Renal measurements: 11.8 x 6.5 x 4.5 cm. = volume: 177 mL. Small exophytic cysts are noted somewhat better visualized than on prior ultrasound. Bladder: Decompressed Other: None. IMPRESSION: Simple cysts bilaterally.  No further follow-up is recommended. No other focal abnormality is seen. Electronically Signed   By: Oneil Devonshire M.D.   On: 05/27/2024 22:44   DG Chest Port 1 View Result Date: 05/22/2024 EXAM: 1 VIEW(S) XRAY OF THE CHEST 05/22/2024 10:35:00 AM COMPARISON: 05/20/2024 CLINICAL HISTORY: pneumonia FINDINGS: LINES, TUBES AND DEVICES: Stable right IJ central venous catheter. LUNGS AND PLEURA: Low lung volumes. Persistent bibasilar airspace opacities, unchanged from prior. HEART AND MEDIASTINUM: Aortic atherosclerosis. BONES AND SOFT TISSUES: No acute osseous abnormality. IMPRESSION: 1. Persistent bibasilar airspace opacities, unchanged from prior. Electronically signed by: Oneil Devonshire MD 05/22/2024 02:25 PM EDT RP Workstation: [id number]   DG CHEST PORT 1 VIEW Result Date: 05/20/2024 CLINICAL DATA:  Status post dialysis catheter insertion EXAM: PORTABLE CHEST 1 VIEW COMPARISON:  05/18/2024 FINDINGS: New right jugular temporary dialysis catheter is seen extending to the cavoatrial junction. No pneumothorax is noted. Persistent bibasilar airspace opacities are noted although somewhat improved when compared with the prior exam. IMPRESSION: Status post temporary dialysis catheter placement. No  pneumothorax is noted. Persistent airspace opacities in the bases although somewhat improved when compared with prior exams. Electronically Signed   By: Oneil Devonshire M.D.   On: 05/20/2024 12:53   ECHOCARDIOGRAM COMPLETE Result Date: 05/19/2024    ECHOCARDIOGRAM REPORT   Patient Name:   Danielle Flores Date of Exam: 05/19/2024 Medical Rec #:  [id number]        Height:       65.0 in Accession #:    [id number]       Weight:       235.9 lb Date of Birth:  1965/11/30         BSA:          2.122 m Patient Age:    58 years         BP:           148/73 mmHg Patient Gender: F                HR:           123 bpm. Exam Location:  Inpatient Procedure: 2D Echo (Both Spectral and Color Flow Doppler were utilized during            procedure). Indications:    elevated troponin  History:        Patient has prior history of Echocardiogram examinations, most                 recent 09/13/2018. CAD, Signs/Symptoms:Murmur; Risk                 Factors:Hypertension, Diabetes and Dyslipidemia.  Sonographer:    Tinnie Barefoot RDCS Referring Phys: [phone] WHITNEY D HARRIS IMPRESSIONS  1. Peak LVOT gradient at rest 25 mmHG. Left ventricular ejection fraction, by estimation, is >75%. The left ventricle has hyperdynamic function. The left ventricle has no regional wall motion abnormalities. There is severe  left ventricular hypertrophy. Left ventricular diastolic parameters are indeterminate. Elevated left atrial pressure. The E/e' is 16.  2. Right ventricular systolic function is normal. The right ventricular size is normal. There is normal pulmonary artery systolic pressure. The estimated right ventricular systolic pressure is 25.8 mmHg.  3. The mitral valve is degenerative. Mild mitral valve regurgitation. No evidence of mitral stenosis.  4. The aortic valve is tricuspid. Aortic valve regurgitation is not visualized. Aortic valve sclerosis is present, with no evidence of aortic valve stenosis.  5. The inferior vena cava is normal in size  with greater than 50% respiratory variability, suggesting right atrial pressure of 3 mmHg. Comparison(s): A prior study was performed on 09/13/2018. LVEF 60-65%, mid cavitary gradient with peak gradient , Grade 2 diastolic dysfunction. FINDINGS  Left Ventricle: Peak LVOT gradient at rest 25 mmHG. Left ventricular ejection fraction, by estimation, is >75%. The left ventricle has hyperdynamic function. The left ventricle has no regional wall motion abnormalities. The left ventricular internal cavity size was small. There is severe left ventricular hypertrophy. Left ventricular diastolic parameters are indeterminate. Elevated left atrial pressure. The E/e' is 16. Right Ventricle: The right ventricular size is normal. No increase in right ventricular wall thickness. Right ventricular systolic function is normal. There is normal pulmonary artery systolic pressure. The tricuspid regurgitant velocity is 2.39 m/s, and  with an assumed right atrial pressure of 3 mmHg, the estimated right ventricular systolic pressure is 25.8 mmHg. Left Atrium: Left atrial size was normal in size. Right Atrium: Right atrial size was normal in size. Pericardium: There is no evidence of pericardial effusion. Mitral Valve: The mitral valve is degenerative in appearance. Mild mitral annular calcification. Mild mitral valve regurgitation. No evidence of mitral valve stenosis. Tricuspid Valve: The tricuspid valve is normal in structure. Tricuspid valve regurgitation is not demonstrated. No evidence of tricuspid stenosis. Aortic Valve: The aortic valve is tricuspid. Aortic valve regurgitation is not visualized. Aortic valve sclerosis is present, with no evidence of aortic valve stenosis. Aortic valve mean gradient measures 22.0 mmHg. Aortic valve peak gradient measures 42.5 mmHg. Pulmonic Valve: The pulmonic valve was normal in structure. Pulmonic valve regurgitation is not visualized. No evidence of pulmonic stenosis. Aorta: The aortic root  and ascending aorta are structurally normal, with no evidence of dilitation. Venous: The inferior vena cava is normal in size with greater than 50% respiratory variability, suggesting right atrial pressure of 3 mmHg. IAS/Shunts: The atrial septum is grossly normal.  LEFT VENTRICLE PLAX 2D LVIDd:         3.70 cm   Diastology LVIDs:         2.20 cm   LV e' medial:    6.09 cm/s LV PW:         1.40 cm   LV E/e' medial:  17.4 LV IVS:        1.60 cm   LV e' lateral:   7.07 cm/s LVOT diam:     1.90 cm   LV E/e' lateral: 15.0 LVOT Area:     2.84 cm  RIGHT VENTRICLE             IVC RV Basal diam:  2.30 cm     IVC diam: 1.40 cm RV S prime:     15.30 cm/s TAPSE (M-mode): 1.6 cm LEFT ATRIUM             Index        RIGHT ATRIUM  Index LA diam:        3.30 cm 1.56 cm/m   RA Area:     10.70 cm LA Vol (A2C):   48.2 ml 22.72 ml/m  RA Volume:   23.10 ml  10.89 ml/m LA Vol (A4C):   51.3 ml 24.18 ml/m LA Biplane Vol: 53.3 ml 25.12 ml/m  AORTIC VALVE AV Vmax:      326.00 cm/s AV Vmean:     217.000 cm/s AV VTI:       0.445 m AV Peak Grad: 42.5 mmHg AV Mean Grad: 22.0 mmHg  AORTA Ao Root diam: 3.20 cm Ao Asc diam:  3.30 cm MITRAL VALVE                TRICUSPID VALVE MV Area (PHT): 5.27 cm     TR Peak grad:   22.8 mmHg MV Decel Time: 144 msec     TR Vmax:        239.00 cm/s MV E velocity: 106.00 cm/s MV A velocity: 135.00 cm/s  SHUNTS MV E/A ratio:  0.79         Systemic Diam: 1.90 cm Sunit Tolia Electronically signed by Madonna Large Signature Date/Time: 05/19/2024/5:46:39 PM    Final    CT CHEST ABDOMEN PELVIS WO CONTRAST Result Date: 05/19/2024 CLINICAL DATA:  Sepsis EXAM: CT CHEST, ABDOMEN AND PELVIS WITHOUT CONTRAST TECHNIQUE: Multidetector CT imaging of the chest, abdomen and pelvis was performed following the standard protocol without IV contrast. RADIATION DOSE REDUCTION: This exam was performed according to the departmental dose-optimization program which includes automated exposure control, adjustment of the  mA and/or kV according to patient size and/or use of iterative reconstruction technique. COMPARISON:  Chest x-ray from earlier in the same day. FINDINGS: CT CHEST FINDINGS Cardiovascular: Somewhat limited due to lack of IV contrast. Atherosclerotic calcifications are noted. No enlargement of the pulmonary artery is seen. No cardiac enlargement is seen. Mediastinum/Nodes: Thoracic inlet is within normal limits. No hilar or mediastinal adenopathy is noted. The esophagus is within normal limits. Lungs/Pleura: Lungs show bilateral lower lobe, left upper lobe and right middle lobe consolidation increased in the interval from the chest x-ray from hours previous. No sizable effusion is seen. Small 4 mm inflammatory nodule is noted in the left upper lobe best seen on image number 69 of series 5. Musculoskeletal: No chest wall mass or suspicious bone lesions identified. CT ABDOMEN PELVIS FINDINGS Hepatobiliary: No focal liver abnormality is seen. No gallstones, gallbladder wall thickening, or biliary dilatation. Pancreas: Unremarkable. No pancreatic ductal dilatation or surrounding inflammatory changes. Spleen: Normal in size without focal abnormality. Adrenals/Urinary Tract: Adrenal glands are within normal limits. Kidneys demonstrate no renal calculi or obstructive changes. Ureters are within normal limits. Bladder is decompressed by Foley catheter. Stomach/Bowel: No obstructive or inflammatory changes of the colon are seen. The appendix is not discretely visualized. No inflammatory changes to suggest appendicitis are noted. Small bowel and stomach are within normal limits. Vascular/Lymphatic: Aortic atherosclerosis. No enlarged abdominal or pelvic lymph nodes. Reproductive: Uterine fibroid change is noted. No adnexal mass is seen. Other: No abdominal wall hernia or abnormality. No abdominopelvic ascites. Musculoskeletal: No acute or significant osseous findings. IMPRESSION: Extensive bilateral infiltrates increased when  compared with the recent plain film. Small likely postinflammatory nodule in the left upper lobe. No specific follow-up is recommended. Uterine fibroid change. No other focal abnormality is seen. Aortic Atherosclerosis (ICD10-I70.0). Electronically Signed   By: Oneil Devonshire M.D.   On: 05/19/2024 00:54   DG  Chest Port 1 View Result Date: 05/18/2024 CLINICAL DATA:  Hypoxia EXAM: PORTABLE CHEST 1 VIEW COMPARISON:  Film from earlier in the same day. FINDINGS: Cardiac shadow is stable. Left lung is clear. Increased consolidation is noted in the right middle lobe along the minor fissure. No sizable effusion is seen. IMPRESSION: Increasing right middle lobe consolidation. Electronically Signed   By: Oneil Devonshire M.D.   On: 05/18/2024 20:44   US  RENAL Result Date: 05/18/2024 CLINICAL DATA:  Acute kidney injury EXAM: RENAL / URINARY TRACT ULTRASOUND COMPLETE COMPARISON:  None Available. FINDINGS: Right Kidney: Renal measurements: 11.7 x 6.8 x 5.8 cm = volume: 237 mL. Normal echotexture. No hydronephrosis. Questionable complex cystic area laterally, not well visualized. Left Kidney: Renal measurements: 11.3 x 5.6 x 5.6 cm = volume: 186 mL. 1.8 cm cyst in the lower pole. Normal echotexture. No hydronephrosis. Bladder: Not visualized. Other: None. IMPRESSION: No acute findings.  No hydronephrosis. Question complex cystic area laterally in the right kidney. This could be further evaluated with contrast-enhanced CT or MRI. Electronically Signed   By: Franky Crease M.D.   On: 05/18/2024 18:01   DG Chest 2 View Result Date: 05/18/2024 CLINICAL DATA:  Shortness of breath EXAM: CHEST - 2 VIEW COMPARISON:  09/13/2018 FINDINGS: The heart size and mediastinal contours are within normal limits. Both lungs are clear. The visualized skeletal structures are unremarkable. IMPRESSION: No active cardiopulmonary disease. Electronically Signed   By: Oneil Devonshire M.D.   On: 05/18/2024 02:10    Labs:  Basic Metabolic Panel: Recent  Labs  Lab 06/05/24 1821 06/06/24 0506 06/07/24 0634 06/09/24 1135  NA 134* 136 136 140  K 3.7 3.7 3.6 3.6  CL 94* 95* 98 102  CO2 23 22 21* 21*  GLUCOSE 154* 115* 120* 125*  BUN 40* 42* 62* 73*  CREATININE 6.94* 7.27* 7.52* 6.60*  CALCIUM  8.5* 8.7* 8.8* 9.2  PHOS 7.2* 8.0*  --   --     CBC: Recent Labs  Lab 06/10/24 0451  WBC 6.5  HGB 8.4*  HCT 26.2*  MCV 80.1  PLT 286    CBG: Recent Labs  Lab 06/09/24 2051 06/10/24 0541 06/10/24 1138 06/10/24 1553 06/10/24 2124  GLUCAP 170* 140* 125* 207* 97    Brief HPI:   Danielle Flores is a 58 y.o. right-handed female with history significant for CAD status post RCA stent, hypertension who was admitted 05/18/2024 with URI type symptoms, nausea vomiting generalized weakness.  She was found to have STEMI, DKA he was febrile with Tmax 102.6 tachycardic and short of breath.  She was started on IV insulin  treated with diuresis and broad-spectrum antibiotics for RML pneumonia per Dr. Wang/cardiology felt elevated troponin secondary to type II MI in the setting of infection and tachycardia.  She had worsening of hypoxia and decrease in UOP requiring BiPAP and diuretics.  2D echocardiogram revealing hyperdynamic ejection fraction greater than 75% with severe LVH.  Cardiology felt elevated tropes due to demand ischemia no further cardiac evaluation needed.  Renal services consulted for input on AKI felt to be in setting of septic shock with ATN.  CRRT initiated with monitoring for renal recovery.  She continued to have poor UOP and had transition to hemodialysis.  Electrolyte abnormalities due to DKA and AKI have improved.  AGMA felt to be in part due to metformin  which was discontinued and anemia of chronic disease treated with ESA and patient declined blood transfusion.  Therapy evaluations completed due to patient decreased functional  mobility was admitted for a comprehensive rehab program.   Hospital Course: Danielle Flores was admitted  to rehab 06/04/2024 for inpatient therapies to consist of PT, ST and OT at least three hours five days a week. Past admission physiatrist, therapy team and rehab RN have worked together to provide customized collaborative inpatient rehab.  Pertaining to patient's debility related to sepsis/pneumonia.  Participating with therapies making progressive gains.  Patient did complete a course of doxycycline.  Subcutaneous heparin  for DVT prophylaxis.  Blood sugars monitored hemoglobin A1c 9.9 insulin  therapy as directed currently off metformin  due to AKI and will need outpatient follow-up.  AKI due to septic shock followed by renal services status post CRRT from 10/14 - 05/22/2024 transition to HD on 10/18.  Renal function continues to improve HD currently on hold.  With latest creatinine 6.60.  Acute non-STEMI followed by cardiology services echocardiogram reviewed no further chest pain or shortness of breath patient remained on Plavix .  Blood pressure remains soft and controlled remaining on metoprolol  for sinus tachycardia.  Anemia of chronic disease treated with Venofer and Aranesp and latest hemoglobin improving 7.2-8.4.  Zetia  ongoing for hyperlipidemia.  Bouts of constipation resolved with laxative assistance.   Blood pressures were monitored on TID basis and remained soft and monitored  Diabetes has been monitored with ac/hs CBG checks and SSI was use prn for tighter BS control.    Rehab course: During patient's stay in rehab weekly team conferences were held to monitor patient's progress, set goals and discuss barriers to discharge. At admission, patient required contact-guard supervision 75 feet rolling walker minimal assist at pivot transfers  He/She  has had improvement in activity tolerance, balance, postural control as well as ability to compensate for deficits. He/She has had improvement in functional use RUE/LUE  and RLE/LLE as well as improvement in awareness       Disposition:  Discharge  disposition: 06-Home-Health Care Svc        Diet: Renal/carb modified  Special Instructions: No driving smoking or alcohol  Follow-up renal services for ongoing monitoring of renal function  Medications at discharge. 1.  Tylenol as needed 2.  Calcium  carbonate 200 mg 1 tablet 3 times daily as needed indigestion 3.  Plavix  75 mg p.o. daily 4.  Zetia  10 mg p.o. daily 5.  Gerhardt daily 6.  Lopressor  50 mg p.o. twice daily 7.  Rena-Vite 1 tablet nightly 8.  Lantus  insulin  20 units daily   30-35 minutes were spent completing discharge summary and discharge planning    Follow-up Information     Kirsteins, Prentice BRAVO, MD Follow up.   Specialty: Physical Medicine and Rehabilitation Why: No formal follow-up needed Contact information: [address] [place] [address] [phone]         Antonio Cyndee Rockers R, DO Follow up.   Specialty: Family Medicine Why: Call for appointment Contact information: [address] [place] [address] [phone]         Rayburn Pac, MD Follow up.   Specialty: Nephrology Why: Call for appointment Contact information: [address] [place] [address] [phone]         Cesario Chamorro, MD Follow up.   Specialty: Cardiology Why: Call for appointment as needed Contact information: [address] [place] [address] [phone]                 Signed: Toribio PARAS Gryphon Vanderveen 06/11/2024, 4:50 AM

## 2024-06-09 NOTE — Progress Notes (Addendum)
 Patient ID: Danielle Flores, female   DOB: [birth year], 58 y.o.   MRN: [id number]  Pt is reaching her goals and are awaiting nephrology input regarding need for OP-HD. No DME needs and awaiting follow up therapies. According to MD may be discharge Wednesday as long as medically cleared.

## 2024-06-09 NOTE — Progress Notes (Signed)
 Advised by nephrologist that pt's renal function is improving and pt will not require HD at d/c from rehab. Contacted [facility] admissions and [facility] to be made aware of this information. Navigator requested that pt's referral for out-pt HD be cancelled due to renal recovery.   Randine Mungo Dialysis Navigator [phone]

## 2024-06-09 NOTE — Progress Notes (Signed)
 Patient ID: Danielle Flores, female   DOB: November 01, 1965, 58 y.o.   MRN: [id number] Met with the patient and spouse to review current medical situation, rehab process, team conference and plan of care. Patient declined information on eating and HD; reported not needed. Given information on dietary modification, reports familiar with insulin  administration. Discussed infection prevention tips; requested flu and pneu. Vaccine before discharge. Continue to follow along to address educational needs to facilitate preparation for discharge. [name]

## 2024-06-09 NOTE — Discharge Instructions (Addendum)
 COMMUNITY REFERRALS UPON DISCHARGE:    Home Health:   PT                 Agency: Phone:   Outpatient: PT             Agency: Phone:              Appointment Date/Time:  Medical Equipment/Items Ordered: NO NEEDS                                                 Agency/Supplier:NA  Inpatient Rehab Discharge Instructions  Danielle Flores Discharge date and time: No discharge date for patient encounter.   Activities/Precautions/ Functional Status: Activity: As tolerated Diet: Renal/carb modified Wound Care: Routine skin checks Functional status:  ___ No restrictions     ___ Walk up steps independently ___ 24/7 supervision/assistance   ___ Walk up steps with assistance ___ Intermittent supervision/assistance  ___ Bathe/dress independently ___ Walk with walker     _x__ Bathe/dress with assistance ___ Walk Independently    ___ Shower independently ___ Walk with assistance    ___ Shower with assistance ___ No alcohol     ___ Return to work/school ________  Special Instructions: No driving smoking or alcohol   My questions have been answered and I understand these instructions. I will adhere to these goals and the provided educational materials after my discharge from the hospital.  Patient/Caregiver Signature _______________________________ Date __________  Clinician Signature _______________________________________ Date __________  Please bring this form and your medication list with you to all your follow-up doctor's appointments. Inpatient Rehab Discharge Instructions  Danielle Flores Discharge date and time: No discharge date for patient encounter.   Activities/Precautions/ Functional Status: Activity: As tolerated Diet: Renal/carb modified Wound Care: Routine skin checks Functional status:  ___ No restrictions     ___ Walk up steps independently ___ 24/7 supervision/assistance   ___ Walk up steps with assistance ___ Intermittent supervision/assistance  ___ Bathe/dress  independently ___ Walk with walker     __x_ Bathe/dress with assistance ___ Walk Independently    ___ Shower independently ___ Walk with assistance    ___ Shower with assistance ___ No alcohol     ___ Return to work/school ________  Special Instructions: No driving smoking or alcohol  COMMUNITY REFERRALS UPON DISCHARGE:    Home Exercise Program to be given to patient from both PT & OT no formal home health or Outpatient recommended due to high level  Medical Equipment/Items Ordered:NA                                                 Agency/Supplier:NA    My questions have been answered and I understand these instructions. I will adhere to these goals and the provided educational materials after my discharge from the hospital.  Patient/Caregiver Signature _______________________________ Date __________  Clinician Signature _______________________________________ Date __________  Please bring this form and your medication list with you to all your follow-up doctor's appointments.

## 2024-06-09 NOTE — Progress Notes (Signed)
 Physical Therapy Session Note  Patient Details  Name: Danielle Flores MRN: [id number] Date of Birth: 03-Oct-1965  Today's Date: 06/09/2024 PT Individual Time: 0800-0840 PT Individual Time Calculation (min): 40 min   Short Term Goals: Week 1:  PT Short Term Goal 1 (Week 1): STG = LTG d/t ELOS  Skilled Therapeutic Interventions/Progress Updates:      Pt lying in bed to start - her husband at bedside. Pt reports no pain, eager to return home.   Bed mobility completed independently. Sit<>stand with distant supervision and no AD or UE support from lowered bed height. Pt requesting to take the wheelchair to the main gym, despite encouragement to ambulate. Transported for energy conservation.  Retested her (632' on eval). She completed 980' total without a standing or seated rest break. Pt reports 4/10 on RPE scale for fatigue. HR resting before testing was 95bpm, increasing to around 115bpm after testing.   Car transfer completed without assist, cues for general safety only. No difficulty noted.   Stair training using 6 steps without 2 hand rails to simulate her home setup/entrance. Pt navigated x12 steps without rails, completing with a reciprocal stepping pattern for both ascent and descent, had no LOB or knee buckling.   Pt instructed in BERG balance assessment - results detailed below. Score impacted by ability to perform heel/toe tandem stance and single leg stance.  Patient demonstrates increased fall risk as noted by score of  54/56 on Berg Balance Scale.  (<36= high risk for falls, close to 100%; 37-45 significant >80%; 46-51 moderate >50%; 52-55 lower >25%)  Pt propelled herself mod I at w/c level from main gym back to her room, ended treatment in bed with needs met.   Therapy Documentation Precautions:  Precautions Precautions: Fall Precaution/Restrictions Comments: Monitor HR; dialysis T/TH/S Restrictions Weight Bearing Restrictions Per Provider Order: No General:     Balance: Balance Balance Assessed: Yes Standardized Balance Assessment Standardized Balance Assessment: Berg Balance Test Berg Balance Test Sit to Stand: Able to stand without using hands and stabilize independently Standing Unsupported: Able to stand safely 2 minutes Sitting with Back Unsupported but Feet Supported on Floor or Stool: Able to sit safely and securely 2 minutes Stand to Sit: Sits safely with minimal use of hands Transfers: Able to transfer safely, minor use of hands Standing Unsupported with Eyes Closed: Able to stand 10 seconds safely Standing Ubsupported with Feet Together: Able to place feet together independently and stand 1 minute safely From Standing, Reach Forward with Outstretched Arm: Can reach confidently >25 cm (10) From Standing Position, Pick up Object from Floor: Able to pick up shoe safely and easily From Standing Position, Turn to Look Behind Over each Shoulder: Looks behind from both sides and weight shifts well Turn 360 Degrees: Able to turn 360 degrees safely in 4 seconds or less Standing Unsupported, Alternately Place Feet on Step/Stool: Able to stand independently and safely and complete 8 steps in 20 seconds Standing Unsupported, One Foot in Front: Able to plae foot ahead of the other independently and hold 30 seconds Standing on One Leg: Able to lift leg independently and hold 5-10 seconds Total Score: 54/56   Therapy/Group: Individual Therapy  Sherlean SHAUNNA Perks 06/09/2024, 7:42 AM

## 2024-06-10 ENCOUNTER — Other Ambulatory Visit (HOSPITAL_COMMUNITY): Payer: Self-pay

## 2024-06-10 ENCOUNTER — Inpatient Hospital Stay (HOSPITAL_COMMUNITY)

## 2024-06-10 HISTORY — PX: IR REMOVAL TUN CV CATH W/O FL: IMG2289

## 2024-06-10 LAB — CBC
HCT: 26.2 % — ABNORMAL LOW (ref 36.0–46.0)
Hemoglobin: 8.4 g/dL — ABNORMAL LOW (ref 12.0–15.0)
MCH: 25.7 pg — ABNORMAL LOW (ref 26.0–34.0)
MCHC: 32.1 g/dL (ref 30.0–36.0)
MCV: 80.1 fL (ref 80.0–100.0)
Platelets: 286 K/uL (ref 150–400)
RBC: 3.27 MIL/uL — ABNORMAL LOW (ref 3.87–5.11)
RDW: 20 % — ABNORMAL HIGH (ref 11.5–15.5)
WBC: 6.5 K/uL (ref 4.0–10.5)
nRBC: 0 % (ref 0.0–0.2)

## 2024-06-10 LAB — OCCULT BLOOD X 1 CARD TO LAB, STOOL: Fecal Occult Bld: NEGATIVE

## 2024-06-10 LAB — GLUCOSE, CAPILLARY
Glucose-Capillary: 140 mg/dL — ABNORMAL HIGH (ref 70–99)
Glucose-Capillary: 125 mg/dL — ABNORMAL HIGH (ref 70–99)
Glucose-Capillary: 207 mg/dL — ABNORMAL HIGH (ref 70–99)
Glucose-Capillary: 97 mg/dL (ref 70–99)

## 2024-06-10 MED ORDER — EZETIMIBE 10 MG PO TABS
10.0000 mg | ORAL_TABLET | Freq: Every day | ORAL | 0 refills | Status: AC
Start: 2024-06-10 — End: ?
  Filled 2024-06-10: qty 30, 30d supply, fill #0

## 2024-06-10 MED ORDER — INSULIN PEN NEEDLE 32G X 4 MM MISC
0 refills | Status: AC
  Filled 2024-06-10: qty 100, 30d supply, fill #0

## 2024-06-10 MED ORDER — CALCIUM CARBONATE ANTACID 500 MG PO CHEW: 1.0000 | CHEWABLE_TABLET | Freq: Three times a day (TID) | ORAL | Status: AC | PRN

## 2024-06-10 MED ORDER — RENA-VITE PO TABS
1.0000 | ORAL_TABLET | Freq: Every day | ORAL | 0 refills | Status: AC
  Filled 2024-06-10: qty 30, 30d supply, fill #0

## 2024-06-10 MED ORDER — METOPROLOL TARTRATE 50 MG PO TABS
50.0000 mg | ORAL_TABLET | Freq: Two times a day (BID) | ORAL | 0 refills | Status: AC
  Filled 2024-06-10: qty 60, 30d supply, fill #0

## 2024-06-10 MED ORDER — GERHARDT'S BUTT CREAM
1.0000 | TOPICAL_CREAM | Freq: Every day | CUTANEOUS | 0 refills | Status: AC
  Filled 2024-06-10: qty 60, 60d supply, fill #0

## 2024-06-10 MED ORDER — CLOPIDOGREL BISULFATE 75 MG PO TABS
75.0000 mg | ORAL_TABLET | Freq: Every day | ORAL | 0 refills | Status: AC
  Filled 2024-06-10: qty 30, 30d supply, fill #0

## 2024-06-10 MED ORDER — INSULIN GLARGINE 100 UNIT/ML SOLOSTAR PEN
20.0000 [IU] | PEN_INJECTOR | Freq: Every day | SUBCUTANEOUS | 11 refills | Status: AC
  Filled 2024-06-10: qty 15, 75d supply, fill #0

## 2024-06-10 MED ORDER — FERRIC CITRATE 1 GM 210 MG(FE) PO TABS
420.0000 mg | ORAL_TABLET | Freq: Three times a day (TID) | ORAL | 0 refills | Status: DC
  Filled 2024-06-10: qty 270, 45d supply, fill #0

## 2024-06-10 NOTE — Progress Notes (Signed)
 Inpatient Rehabilitation Care Coordinator Discharge Note   Patient Details  Name: Danielle Flores MRN: [id number] Date of Birth: 1965-12-21   Discharge location: HOME WITH HUSBAND WHO IS SEMI-RETIRED AND CAN BE THERE SOME WITH PT  Length of Stay: 7 DAYS  Discharge activity level: INDEPENDENT LEVEL  Home/community participation: ACTIVE  Patient response un:Yzjouy Literacy - How often do you need to have someone help you when you read instructions, pamphlets, or other written material from your doctor or pharmacy?: Never  Patient response un:Dnrpjo Isolation - How often do you feel lonely or isolated from those around you?: Never  Services provided included: MD, RD, PT, OT, RN, CM, Pharmacy, Neuropsych, SW  Financial Services:  Field Seismologist Utilized: Private Insurance [employer]  Choices offered to/list presented to: PT AND HUSBAND  Follow-up services arranged:  Other (Comment) (NO NEEDS)           Patient response to transportation need: Is the patient able to respond to transportation needs?: Yes In the past 12 months, has lack of transportation kept you from medical appointments or from getting medications?: No In the past 12 months, has lack of transportation kept you from meetings, work, or from getting things needed for daily living?: No   Patient/Family verbalized understanding of follow-up arrangements:  Yes  Individual responsible for coordination of the follow-up plan: SELF AND ERIC-HUSBAND [phone]  Confirmed correct DME delivered: Raymonde Asberry MATSU 06/10/2024    Comments (or additional information):PT RECOVERED AND HER KIDNEYS ARE FUNCTIONAL SO NO NEED FOR OP-HD. PT PLANS TO GET BACK IN THE GYM AND IS RREADY TO GO HOME    Barbarann Kelly G

## 2024-06-10 NOTE — Progress Notes (Signed)
 PROGRESS NOTE   Subjective/Complaints:  No need for HD per nephro , removal of HD cath planned, IV team should be able to do that Discussed neg stool guaic, also discussed tachycardia ROS- neg CP, SOB, abd pain, n/v/d/c  Objective:   No results found.  Recent Labs    06/10/24 0451  WBC 6.5  HGB 8.4*  HCT 26.2*  PLT 286    Recent Labs    06/09/24 1135  NA 140  K 3.6  CL 102  CO2 21*  GLUCOSE 125*  BUN 73*  CREATININE 6.60*  CALCIUM  9.2   No intake or output data in the 24 hours ending 06/10/24 0737       Physical Exam: Vital Signs Blood pressure (!) 145/82, pulse 93, temperature 98.6 F (37 C), resp. rate 18, height 5' 5 (1.651 m), weight 83.3 kg, SpO2 99%.  General: No acute distress, resting comfortably in bed.  Mood and affect are flat Heart: Regular rate and rhythm no rubs murmurs or extra sounds, dialysis catheter R chest c/d/i Lungs: Clear to auscultation, breathing unlabored, no rales or wheezes Abdomen: Positive bowel sounds, soft nontender to palpation, nondistended Extremities: No clubbing, cyanosis, or edema Skin: No evidence of breakdown, no evidence of rash, Right upper chest HD cath CDI , no erythema   PRIOR EXAMS: Neurologic: Cranial nerves II through XII intact, motor strength is 5/5 in bilateral deltoid, bicep, tricep, grip, hip flexor, knee extensors, ankle dorsiflexor and plantar flexor Sensory exam reduced sensation to light touch left tip of middle finger Cerebellar exam normal finger to nose to finger as well as heel to shin in bilateral upper and lower extremities Musculoskeletal: Full range of motion in all 4 extremities. No joint swelling    Assessment/Plan: 1. Functional deficits which require 3+ hours per day of interdisciplinary therapy in a comprehensive inpatient rehab setting. Physiatrist is providing close team supervision and 24 hour management of active medical  problems listed below. Physiatrist and rehab team continue to assess barriers to discharge/monitor patient progress toward functional and medical goals  Care Tool:  Bathing    Body parts bathed by patient: Right arm, Left arm, Chest, Abdomen, Front perineal area, Buttocks, Face, Left lower leg, Right lower leg, Left upper leg, Right upper leg         Bathing assist Assist Level: Independent     Upper Body Dressing/Undressing Upper body dressing   What is the patient wearing?: Pull over shirt    Upper body assist Assist Level: Independent    Lower Body Dressing/Undressing Lower body dressing      What is the patient wearing?: Pants, Underwear/pull up     Lower body assist Assist for lower body dressing: Independent     Toileting Toileting    Toileting assist Assist for toileting: Independent     Transfers Chair/bed transfer  Transfers assist     Chair/bed transfer assist level: Independent     Locomotion Ambulation   Ambulation assist      Assist level: Supervision/Verbal cueing Assistive device: No Device Max distance: 632'   Walk 10 feet activity   Assist     Assist level: Supervision/Verbal cueing Assistive device:  No Device   Walk 50 feet activity   Assist    Assist level: Supervision/Verbal cueing Assistive device: No Device    Walk 150 feet activity   Assist    Assist level: Supervision/Verbal cueing Assistive device: No Device    Walk 10 feet on uneven surface  activity   Assist Walk 10 feet on uneven surfaces activity did not occur: Safety/medical concerns (d/t fatigue)         Wheelchair     Assist Is the patient using a wheelchair?: Yes Type of Wheelchair: Manual    Wheelchair assist level: Independent Max wheelchair distance: 150'    Wheelchair 50 feet with 2 turns activity    Assist        Assist Level: Independent   Wheelchair 150 feet activity     Assist      Assist Level:  Independent   Blood pressure (!) 145/82, pulse 93, temperature 98.6 F (37 C), resp. rate 18, height 5' 5 (1.651 m), weight 83.3 kg, SpO2 99%.  Medical Problem List and Plan: 1. Functional deficits- debility secondary to sepsis due to  pneumonia             -patient may shower             -ELOS/Goals:06/11/24, modI/Sup goals            2.  Antithrombotics: -DVT/anticoagulation:  Pharmaceutical: Heparin  5kU q8h- d/c amb > 100' with PT             -antiplatelet therapy: Plavix  daily 3. Pain Management: Tylenol prn.  4. Mood/Behavior/Sleep: LCSW to follow for evaluation and support.              -antipsychotic agents: N/A 5. Neuropsych/cognition: This patient is capable of making decisions on her own behalf. 6. Skin/Wound Care: Routine pressure measures. 7. Fluids/Electrolytes/Nutrition: Strict I/O. Daily weights. Continue vitamins/supplements   -06/07/24 labs stable 8.   Septic shock due to multifocal PNA: Has completed Unasyn-->doxycycline thru 10/21.             --diarrhea has resolved.    9.  T2DM poorly controlled: Hgb A1c-9.9. Monitor BS ac/hs and use SSI for elevated BS. Off metformin . Used 70/30 insulin  bid PTA.               --continue Insulin  glargine 22 units with 8 units novolog  tid ac  -11/4 controlled semglee  20U per day  CBG (last 3)  Recent Labs    06/09/24 1658 06/09/24 2051 06/10/24 0541  GLUCAP 152* 170* 140*     10. AKI due to septic shock: HD dependent at this time. Schedule dialysis at the end of the day to help with tolerance of therapy             --Strict I/O. Daily weights.              --continue Auyrxia for elevated phos.   -06/08/24 per nephro, holding dialysis for now.     Latest Ref Rng & Units 06/09/2024   11:35 AM 06/07/2024    6:34 AM 06/06/2024    5:06 AM  BMP  Glucose 70 - 99 mg/dL 874  879  884   BUN 6 - 20 mg/dL 73  62  42   Creatinine 0.44 - 1.00 mg/dL 3.39  2.47  2.72   Sodium 135 - 145 mmol/L 140  136  136   Potassium 3.5 - 5.1  mmol/L 3.6  3.6  3.7   Chloride  98 - 111 mmol/L 102  98  95   CO2 22 - 32 mmol/L 21  21  22    Calcium  8.9 - 10.3 mg/dL 9.2  8.8  8.7    Creat improving per Nephro, ok to d/c HD   11. Anemia of acute illness: Treated with Venofer and Aranesp. Initial Hgb at admit was 14gm, check stool OB --Hgb  improved from 6.8-->7.4--> 7.2- stable will check stool OB, results not available yet      Latest Ref Rng & Units 06/10/2024    4:51 AM 06/04/2024    2:50 AM 06/03/2024    8:45 AM  CBC  WBC 4.0 - 10.5 K/uL 6.5  7.4  7.8   Hemoglobin 12.0 - 15.0 g/dL 8.4  7.2  7.4   Hematocrit 36.0 - 46.0 % 26.2  21.9  22.3   Platelets 150 - 400 K/uL 286  352  379    Stool OB neg 11/4 12. CAD: Continue plavix . Metoprolol  50mg  BID resumed on 10/29 due to sinus tachycardia. Tachy may be related to anemia   13. Abnormal LFTs: Likely due to shocked liver. Improved 10/30     14. HLD: continue zetia  10mg  daily  15.  Constipation LBM 11/1  LOS: 6 days A FACE TO FACE EVALUATION WAS PERFORMED  Prentice FORBES Compton 06/10/2024, 7:38 AM

## 2024-06-10 NOTE — Progress Notes (Signed)
 Patient ID: Danielle Flores, female   DOB: April 08, 1966, 58 y.o.   MRN: [id number] Both PT and OT do not recommend equipment nor follow up therapies. Each will give pt a home exercise program for home. Pt feels ready to go home tomorrow.

## 2024-06-10 NOTE — Progress Notes (Incomplete)
 Inpatient Rehabilitation Discharge Medication Review by a Pharmacist   A complete drug regimen review was completed for this patient to identify any potential clinically significant medication issues.  High Risk Drug Classes Is patient taking? Indication by Medication  Antipsychotic    Anticoagulant    Antibiotic No   Opioid No   Antiplatelet Yes Clopidogrel  - CAD  Hypoglycemics/insulin  Yes Insulin  aspart, insulin  glargine - DM  Vasoactive Medication Yes Metoprolol  - HTN  Chemotherapy No   Other Yes Darbepoetin - ESRD anemia Auryxia - hyperphosphatemia  Ezetimibe  - HLD Iron sucrose - iron deficiency   MV - supplement Diphenhydramine - PRN itching Robitussin DM - PRN cough Loperamide - PRN diarrhea Trazodone - PRN sleep Sorbitol - constipation     Type of Medication Issue Identified Description of Issue Recommendation(s)  Drug Interaction(s) (clinically significant)     Duplicate Therapy     Allergy     No Medication Administration End Date     Incorrect Dose     Additional Drug Therapy Needed     Significant med changes from prior encounter (inform family/care partners about these prior to discharge). Amlodipine , empagliflozin , metformin , rosuvastatin , and insulin  70/30 were stopped at DC Communicate relevant medication changes to patient/family members at discharge from CIR.   Other       Clinically significant medication issues were identified that warrant physician communication and completion of prescribed/recommended actions by midnight of the next day:  No   Pharmacist comments: n/a   Time spent performing this drug regimen review (minutes):     Danielle Flores, RPh Clinical Pharmacist 06/10/2024 9:51 AM

## 2024-06-10 NOTE — Plan of Care (Signed)
  Problem: RH Balance Goal: LTG Patient will maintain dynamic standing with ADLs (OT) Description: LTG:  Patient will maintain dynamic standing balance with assist during activities of daily living (OT)  Outcome: Completed/Met   Problem: RH Bathing Goal: LTG Patient will bathe all body parts with assist levels (OT) Description: LTG: Patient will bathe all body parts with assist levels (OT) Outcome: Completed/Met   Problem: RH Dressing Goal: LTG Patient will perform upper body dressing (OT) Description: LTG Patient will perform upper body dressing with assist, with/without cues (OT). Outcome: Completed/Met Goal: LTG Patient will perform lower body dressing w/assist (OT) Description: LTG: Patient will perform lower body dressing with assist, with/without cues in positioning using equipment (OT) Outcome: Completed/Met   Problem: RH Toileting Goal: LTG Patient will perform toileting task (3/3 steps) with assistance level (OT) Description: LTG: Patient will perform toileting task (3/3 steps) with assistance level (OT)  Outcome: Completed/Met   Problem: RH Simple Meal Prep Goal: LTG Patient will perform simple meal prep w/assist (OT) Description: LTG: Patient will perform simple meal prep with assistance, with/without cues (OT). Outcome: Completed/Met   Problem: RH Toilet Transfers Goal: LTG Patient will perform toilet transfers w/assist (OT) Description: LTG: Patient will perform toilet transfers with assist, with/without cues using equipment (OT) Outcome: Completed/Met   Problem: RH Tub/Shower Transfers Goal: LTG Patient will perform tub/shower transfers w/assist (OT) Description: LTG: Patient will perform tub/shower transfers with assist, with/without cues using equipment (OT) Outcome: Completed/Met

## 2024-06-10 NOTE — Progress Notes (Signed)
 Occupational Therapy Discharge Summary  Patient Details  Name: Danielle Flores MRN: [id number] Date of Birth: 11/13/1965  Date of Discharge from OT service:June 10, 2024     Patient has met 8 of 8 long term goals due to improved activity tolerance.  Patient to discharge at overall Independent level.  Patient's care partner is independent to provide the necessary physical assistance at discharge.  Family education completed with spouse.  Reasons goals not met: n/a  Recommendation:  No further OT services needed at this time.   Equipment: No equipment provided  Reasons for discharge: treatment goals met  Patient/family agrees with progress made and goals achieved: Yes  OT Discharge Precautions/Restrictions  Precautions Precautions: Fall Precaution/Restrictions Comments: .  ADL ADL ADL Comments: independent with all tasks. will use shower seat at home. Vision Baseline Vision/History: 0 No visual deficits Patient Visual Report: No change from baseline Vision Assessment?: No apparent visual deficits Perception  Perception: Within Functional Limits Praxis Praxis: WFL Cognition Cognition Overall Cognitive Status: Within Functional Limits for tasks assessed Safety/Judgment: Appears intact Brief Interview for Mental Status (BIMS) Repetition of Three Words (First Attempt): 3 Temporal Orientation: Year: Correct Temporal Orientation: Month: Accurate within 5 days Temporal Orientation: Day: Correct Recall: Sock: Yes, no cue required Recall: Blue: Yes, no cue required Recall: Bed: Yes, no cue required BIMS Summary Score: 15 Sensation Sensation Light Touch: Appears Intact Hot/Cold: Appears Intact Proprioception: Appears Intact Stereognosis: Appears Intact Coordination Gross Motor Movements are Fluid and Coordinated: Yes Fine Motor Movements are Fluid and Coordinated: Yes Motor  Motor Motor - Skilled Clinical Observations: General debility and  weakness/deconditioning Motor - Discharge Observations: General debility and weakness/deconditioning but has improved as pt is able to ambulate over 900 feet without A or an AD Mobility  Transfers Sit to Stand: Independent Stand to Sit: Independent  Trunk/Postural Assessment  Cervical Assessment Cervical Assessment: Within Functional Limits Thoracic Assessment Thoracic Assessment: Within Functional Limits Lumbar Assessment Lumbar Assessment: Within Functional Limits Postural Control Postural Control: Within Functional Limits  Balance Static Sitting Balance Static Sitting - Level of Assistance: 7: Independent Dynamic Sitting Balance Dynamic Sitting - Level of Assistance: 7: Independent Static Standing Balance Static Standing - Level of Assistance: 7: Independent Dynamic Standing Balance Dynamic Standing - Level of Assistance: 7: Independent Extremity/Trunk Assessment RUE Assessment RUE Assessment: Within Functional Limits LUE Assessment LUE Assessment: Within Functional Limits   Taro Hidrogo 06/10/2024, 10:16 AM

## 2024-06-10 NOTE — Progress Notes (Signed)
 Physical Therapy Session Note  Patient Details  Name: Danielle Flores MRN: [id number] Date of Birth: Aug 24, 1965  Today's Date: 06/10/2024 PT Individual Time: 9194-9169; 1108 - 1116 PT Individual Time Calculation (min): 25 min; 8 min   Short Term Goals: Week 1:  PT Short Term Goal 1 (Week 1): STG = LTG d/t ELOS  SESSION 1 Skilled Therapeutic Interventions/Progress Updates: Patient supine in bed on entrance to room. Patient alert and agreeable to PT session.   Patient reported no pain only fatigue.   Therapeutic Activity: Bed Mobility: Pt performed supine<>sit on EOB independently  Transfers: Pt performed sit<>stand transfers throughout session independently  Session focused on providing pt with HEP with verbal instructions provided on half of HEP with pt verbally expressing understanding (will go over HEP at later session in morning due to time constraints).   Access Code: [facility] URL: [email or web address] Date: 06/10/2024 Prepared by: Delinda Bertrand  Exercises - Standing Single Leg Stance with Counter Support  - 1 x daily - 7 x weekly - 3-4 sets - 30 seconds hold - Standing Tandem Balance with Counter Support  - 1 x daily - 7 x weekly - 3-4 sets - 30 seconds hold - Sit to Stand with Arms Crossed  - 1 x daily - 7 x weekly - 3 sets - 10-15 reps - Side Stepping with Resistance at Thighs  - 1 x daily - 7 x weekly - 3 sets - Anti-Rotation Sidestepping with Resistance  - 1 x daily - 7 x weekly - 3 sets - 10-15 reps - Standing Trunk Rotation with Resistance  - 1 x daily - 7 x weekly - 3 sets - 10-15 reps  Patient supine in bed at end of session with brakes locked and all needs in reach.   SESSION 2 Skilled Therapeutic Interventions/Progress Updates: Patient semi-reclined in bed with husband present on entrance to room. Patient alert and politely declined PT session despite encouragement but willing to go over rest of HEP. PTA printed HEP and verbally discussed  modified instructions of anti-rotation sidestepping with resistance, as well as standing trunk rotation with resistance exercises and how to anchor the resistant bands between door frame and the door at home. Pt understood the sequence of each exercise and how many to do of each. Pt and pt husband did not have any further concerns or questions regarding d/c home.      Therapy Documentation Precautions:  Precautions Precautions: Fall Precaution/Restrictions Comments: . Restrictions Weight Bearing Restrictions Per Provider Order: No  Therapy/Group: Individual Therapy  Jestin Burbach PTA 06/10/2024, 12:04 PM

## 2024-06-10 NOTE — Procedures (Signed)
 Interventional Radiology Procedure Note  Risks and benefits of removal of hemodialysis catheter were discussed with the patient including, but not limited to bleeding, hematoma, infection, damage to adjacent structures.  All of the patient's questions were answered, patient is agreeable to proceed. Consent signed and in chart. PROCEDURE SUMMARY:  Successful removal of tunneled dialysis catheter.  No complications.   EBL = trace  Please see full dictation in imaging section of Epic for procedure details.  Delon JAYSON Beagle NP 06/10/2024 10:51 AM

## 2024-06-10 NOTE — Progress Notes (Addendum)
 Danielle Flores [facility] Progress Note    Assessment/ Plan:    AKI - in setting of DKA & ischemic ATN in setting of volume depletion and acute illness as well as hypotension. Baseline Cr ~1. UA with gluc, blood, and protein.  Negative nitrite and renal US  without obstruction. S/p CRRT via temp line from 10/14-10/16/25. Transitioned to HD on 10/18  Appreciate VIR placing the right IJ [facility] 10/27 and removing the temp cath + also removing the TC on 11/4. UOP increasing nicely and renal function improving I would not discharge her on a binder (ie renvela or auryxia); she is recovering function and the phos will come down nicely on its own. I've already contacted [facility] office to give her an appt to be seen in the next 2 weeks to confirm continued recovery of renal function. Signing off at this time; please reconsult as needed. From renal standpoint stable for dc. Avoid nephrotoxic medications including NSAIDs and iodinated intravenous contrast exposure unless the latter is absolutely indicated.  Preferred narcotic agents for pain control are hydromorphone, fentanyl , and methadone. Morphine should not be used. Avoid Baclofen and avoid oral sodium phosphate and magnesium citrate based laxatives / bowel preps. Continue strict Input and Output monitoring. Will monitor the patient closely with you and intervene or adjust therapy as indicated by changes in clinical status/labs   Severe sepsis with septic shock due to RML pneumonia -s/p doxycycline.  Per primary service Hematuria: UA pending (collect when able), renal u/s 10/21 unrevealing, w/u per primary service Acute hypoxic respiratory failure - improved  Acute NSTEMI - on plavix  and being followed by Cardiology. Hyponatremia - due to DKA and AKI, will attempt to manage with HD Hypokalemia - due to DKA. resolved DKA - off insulin  drip per primary service Abnormal LFT's - likely shock liver.  Per primary Thrombocytopenia - due to acute critical  illness--resolved AGMA - due to #1 and #7.  Metformin  stopped.  Improved with renal replacement therapy  Anemia - likely due to critical illness and AKI.  She will not accept blood transfusions ([employer]).  ESA dose increased for 10/25.  Receiving IV iron since she is off antibiotics.  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 d/w RD. Will recheck phos on Fr -> 8.7 -> recheck Tues. Upset her stomach and will try 2 tabs but spaced out through the meal Severe protein malnutrition - alb low.  protein supplements per RD.  In CIR and tired but tolerating; will start CLIP process for AKI if not recovered when she's closer to d/c.Danielle Flores   Subjective:   She reports great  UOP, denies fever, chills, nausea, shortness of breath     Objective:   BP (!) 145/82 (BP Location: Left Arm)   Pulse 93   Temp 98.6 F (37 C)   Resp 18   Ht 5' 5 (1.651 m)   Wt 83.3 kg   LMP  (LMP Unknown)   SpO2 99%   BMI 30.56 kg/m  No intake or output data in the 24 hours ending 06/10/24 1239   Weight change: -3.4 kg  Physical Exam: Gen: NAD, laying flat in bed CVS: RRR Resp: CTA B/L, normal wob on RA Jai:dnqu, nt/nd Ext: no edema Neuro: awake, alert Dialysis access: RIJ tunneled HD catheter out  Imaging: No results found.     Labs: BMET Recent Labs  Lab 06/04/24 0250 06/05/24 1821 06/06/24  9493 06/07/24 0634 06/09/24 1135  NA 137 134* 136 136 140  K 4.2 3.7 3.7 3.6 3.6  CL 100 94* 95* 98 102  CO2 18* 23 22 21* 21*  GLUCOSE 93 154* 115* 120* 125*  BUN 56* 40* 42* 62* 73*  CREATININE 9.75* 6.94* 7.27* 7.52* 6.60*  CALCIUM  8.7* 8.5* 8.7* 8.8* 9.2  PHOS 10.1* 7.2* 8.0*  --   --    CBC Recent Labs  Lab 06/04/24 0250 06/10/24 0451  WBC 7.4 6.5  NEUTROABS 5.5  --   HGB 7.2* 8.4*  HCT 21.9* 26.2*  MCV 76.6* 80.1  PLT 352 286    Medications:     (feeding supplement) PROSource Plus   30 mL Oral Daily   Chlorhexidine Gluconate Cloth  6 each Topical BID   clopidogrel   75 mg Oral Daily   darbepoetin (ARANESP) injection - DIALYSIS  150 mcg Subcutaneous Q Sun-1800   ezetimibe   10 mg Oral Daily   feeding supplement (NEPRO CARB STEADY)  237 mL Oral BID BM   ferric citrate  420 mg Oral TID WC   Gerhardt's butt cream   Topical Daily   influenza vac split trivalent PF  0.5 mL Intramuscular Tomorrow-1000   insulin  aspart  0-9 Units Subcutaneous TID WC   insulin  glargine-yfgn  20 Units Subcutaneous Daily   metoprolol  tartrate  50 mg Oral BID   multivitamin  1 tablet Oral QHS

## 2024-06-10 NOTE — Plan of Care (Signed)
 Per CVA team; Patient is doing well post NSTEMI and DKA with septic shock requiring temp HD. Limited by tachycardia and poor endurance. Patient is now independent in room and independent with all self care and mobility. She will meet identified goals and be ready for discharge pending medical clearance for HD by 06/11/24. No equipment needs and no recommendations for follow up services. HEP given to the patient. Danielle Flores

## 2024-06-10 NOTE — Progress Notes (Signed)
 Occupational Therapy Session Note  Patient Details  Name: Danielle Flores MRN: [id number] Date of Birth: September 09, 1965  Today's Date: 06/10/2024 OT Individual Time: 0930-1000 OT Individual Time Calculation (min): 30 min  and Today's Date: 06/10/2024 OT Missed Time: 30 Minutes Missed Time Reason: Patient fatigue   Short Term Goals: Week 1:  OT Short Term Goal 1 (Week 1): STGs = LTGs  Skilled Therapeutic Interventions/Progress Updates:    Pt received in bed stating she was tired and did not feel like doing therapy.  She did agree to trying to do a short amount.   Spouse in room and both confirmed she completed all self care at an independent level.  Waiting for dialysis port to be removed for her shower which she wants to do at home.  They confirmed they are getting a shower chair on her own.  Pt got out of bed and transferred to w/c and self propelled 150 ft.    Engaged in arm bike and standing at counter LE exercises.   Cardiovascular Exercises Upper Body Ergometer:  (15 min at intensity 1, pt able to pedal for 2-3 min then rested for a total of 15) General Exercises - Lower Extremity Hip ABduction/ADduction: 10 reps Heel Raises: 10 reps Mini-Sqauts: 10 reps Standing Knee Flexion: 10 reps Standing knee extension: 10 reps  Wrote this exercises down for her HEP of standing kitchen sink exercises to be completed in less than 5 min and encouraged her to do several times during the day.   Pt stated she was too tired, I need to go back to the room.  Pt returned to room with spouse and all needs met.   Therapy Documentation Precautions:  Precautions Precautions: Fall Precaution/Restrictions Comments: Monitor HR; dialysis T/TH/S Restrictions Weight Bearing Restrictions Per Provider Order: No General: General OT Amount of Missed Time: 30 Minutes    Pain: Pain Assessment Pain Score: 0-No pain ADL: ADL ADL Comments: independent with all tasks. will use shower seat at  home.      Exercises:    Therapy/Group: Individual Therapy  Darnelle Derrick 06/10/2024, 10:07 AM

## 2024-06-10 NOTE — Progress Notes (Signed)
 Physical Therapy Discharge Summary  Patient Details  Name: Danielle Flores MRN: [id number] Date of Birth: [birth year]  Date of Discharge from PT service:June 10, 2024  Patient has met 8 of 8 long term goals due to {due un:6958322}.  Patient to discharge at [facility] level {LOA:3049010}.   Patient's care partner {care partner:3041650} to provide the necessary {assistance:3041652} assistance at discharge.  Recommendation:  Patient will benefit from ongoing skilled PT services in {setting:3041680} to continue to advance safe functional mobility, address ongoing impairments in ***, and minimize fall risk.  Equipment: No equipment provided  Reasons for discharge: {Reason for discharge:3049018}  Patient/family agrees with progress made and goals achieved: {Pt/Family agree with progress/goals:3049020}  PT Discharge Precautions/Restrictions Precautions Precautions: Fall Precaution/Restrictions Comments: . Restrictions Weight Bearing Restrictions Per Provider Order: No Pain Pain Assessment Pain Scale: 0-10 Pain Score: 0-No pain Pain Interference Pain Interference Pain Effect on Sleep: 1. Rarely or not at all Pain Interference with Therapy Activities: 1. Rarely or not at all Pain Interference with Day-to-Day Activities: 1. Rarely or not at all Vision/Perception  Vision - History Ability to See in Adequate Light: 0 Adequate Perception Perception: Within Functional Limits Praxis Praxis: WFL  Cognition Overall Cognitive Status: Within Functional Limits for tasks assessed Arousal/Alertness: Awake/alert Orientation Level: Oriented X4 Memory: Appears intact Awareness: Appears intact Problem Solving: Appears intact Safety/Judgment: Appears intact Sensation Sensation Light Touch: Appears Intact Hot/Cold: Appears Intact Proprioception: Appears Intact Stereognosis: Appears Intact Coordination Gross Motor Movements are Fluid and Coordinated: Yes Fine Motor Movements are  Fluid and Coordinated: Yes Motor  Motor Motor: Within Functional Limits Motor - Skilled Clinical Observations: General debility and weakness/deconditioning Motor - Discharge Observations: General debility and weakness/deconditioning but has improved as pt is able to ambulate over 900 feet without A or an AD  Mobility Bed Mobility Bed Mobility: Rolling Left;Rolling Right;Sit to Supine;Supine to Sit Rolling Right: Independent Rolling Left: Independent Supine to Sit: Independent Sit to Supine: Independent Transfers Transfers: Sit to Stand;Stand Pivot Transfers;Stand to Sit Sit to Stand: Independent Stand to Sit: Independent Stand Pivot Transfers: Independent Transfer (Assistive device): None Locomotion  Gait Ambulation: Yes Gait Assistance: Independent Gait Distance (Feet): 980 Feet Assistive device: None Gait Gait: Yes Gait Pattern: Within Functional Limits Stairs / Additional Locomotion Stairs: Yes Stairs Assistance: Independent Stair Management Technique: Two rails;Alternating pattern Number of Stairs: 12 Height of Stairs: 6 Wheelchair Mobility Wheelchair Mobility: No  Trunk/Postural Assessment  Cervical Assessment Cervical Assessment: Within Functional Limits Thoracic Assessment Thoracic Assessment: Within Functional Limits Lumbar Assessment Lumbar Assessment: Within Functional Limits Postural Control Postural Control: Within Functional Limits  Balance Balance Balance Assessed: Yes Standardized Balance Assessment Standardized Balance Assessment: Berg Balance Test Berg Balance Test Sit to Stand: Able to stand without using hands and stabilize independently Standing Unsupported: Able to stand safely 2 minutes Sitting with Back Unsupported but Feet Supported on Floor or Stool: Able to sit safely and securely 2 minutes Stand to Sit: Sits safely with minimal use of hands Transfers: Able to transfer safely, minor use of hands Standing Unsupported with Eyes Closed:  Able to stand 10 seconds safely Standing Ubsupported with Feet Together: Able to place feet together independently and stand 1 minute safely From Standing, Reach Forward with Outstretched Arm: Can reach confidently >25 cm (10) From Standing Position, Pick up Object from Floor: Able to pick up shoe safely and easily From Standing Position, Turn to Look Behind Over each Shoulder: Looks behind from both sides and weight shifts well Turn 360 Degrees: Able to  turn 360 degrees safely in 4 seconds or less Standing Unsupported, Alternately Place Feet on Step/Stool: Able to stand independently and safely and complete 8 steps in 20 seconds Standing Unsupported, One Foot in Front: Able to plae foot ahead of the other independently and hold 30 seconds Standing on One Leg: Able to lift leg independently and hold 5-10 seconds Total Score: 54 Static Sitting Balance Static Sitting - Balance Support: Feet supported Static Sitting - Level of Assistance: 7: Independent Dynamic Sitting Balance Dynamic Sitting - Balance Support: Feet supported Dynamic Sitting - Level of Assistance: 7: Independent Static Standing Balance Static Standing - Balance Support: No upper extremity supported Static Standing - Level of Assistance: 7: Independent Dynamic Standing Balance Dynamic Standing - Balance Support: During functional activity;No upper extremity supported Dynamic Standing - Level of Assistance: 7: Independent Extremity Assessment  RUE Assessment RUE Assessment: Within Functional Limits LUE Assessment LUE Assessment: Within Functional Limits RLE Assessment RLE Assessment: Within Functional Limits LLE Assessment LLE Assessment: Within Functional Limits   Alida Greiner PTA   06/10/2024, 12:45 PM

## 2024-06-11 ENCOUNTER — Other Ambulatory Visit (HOSPITAL_COMMUNITY): Payer: Self-pay

## 2024-06-11 LAB — RENAL FUNCTION PANEL
Albumin: 2.9 g/dL — ABNORMAL LOW (ref 3.5–5.0)
Anion gap: 18 — ABNORMAL HIGH (ref 5–15)
BUN: 68 mg/dL — ABNORMAL HIGH (ref 6–20)
CO2: 20 mmol/L — ABNORMAL LOW (ref 22–32)
Calcium: 9.4 mg/dL (ref 8.9–10.3)
Chloride: 105 mmol/L (ref 98–111)
Creatinine, Ser: 5.24 mg/dL — ABNORMAL HIGH (ref 0.44–1.00)
GFR, Estimated: 9 mL/min — ABNORMAL LOW (ref >60)
Glucose, Bld: 89 mg/dL (ref 70–99)
Phosphorus: 5.7 mg/dL — ABNORMAL HIGH (ref 2.5–4.6)
Potassium: 3.4 mmol/L — ABNORMAL LOW (ref 3.5–5.1)
Sodium: 143 mmol/L (ref 135–145)

## 2024-06-11 LAB — GLUCOSE, CAPILLARY: Glucose-Capillary: 95 mg/dL (ref 70–99)

## 2024-06-11 MED ORDER — POTASSIUM CHLORIDE CRYS ER 10 MEQ PO TBCR
10.0000 meq | EXTENDED_RELEASE_TABLET | Freq: Once | ORAL | Status: AC
  Administered 2024-06-11: 10 meq via ORAL
  Filled 2024-06-11: qty 1

## 2024-06-11 NOTE — Progress Notes (Signed)
 Inpatient Rehabilitation Discharge Medication Review by a Pharmacist   A complete drug regimen review was completed for this patient to identify any potential clinically significant medication issues.  High Risk Drug Classes Is patient taking? Indication by Medication  Antipsychotic No   Anticoagulant No   Antibiotic No   Opioid No   Antiplatelet Yes Clopidogrel  - CAD  Hypoglycemics/insulin  Yes Insulin  glargine - DM  Vasoactive Medication Yes Metoprolol  - HTN  Chemotherapy No   Other Yes Acetaminophen- pain Ezetimibe  - HLD MVI, calcium   - supplement     Type of Medication Issue Identified Description of Issue Recommendation(s)  Drug Interaction(s) (clinically significant)     Duplicate Therapy     Allergy     No Medication Administration End Date     Incorrect Dose     Additional Drug Therapy Needed     Significant med changes from prior encounter (inform family/care partners about these prior to discharge). Amlodipine , empagliflozin , metformin , rosuvastatin , and insulin  70/30 were stopped on [facility] acute care discharge.  Communicate relevant medication changes to patient/family members at discharge from CIR. AVS instructs patient to stop taking all medications not included on discharge medication list   Other       Clinically significant medication issues were identified that warrant physician communication and completion of prescribed/recommended actions by midnight of the next day:  No   Pharmacist comments: n/a   Time spent performing this drug regimen review (minutes):     Levorn Gaskins, RPh Clinical Pharmacist 06/11/2024

## 2024-06-11 NOTE — Progress Notes (Signed)
 PROGRESS NOTE   Subjective/Complaints:  Discussed blood work, f/u care  ROS- neg CP, SOB, abd pain, n/v/d/c  Objective:   No results found.  Recent Labs    06/10/24 0451  WBC 6.5  HGB 8.4*  HCT 26.2*  PLT 286    Recent Labs    06/09/24 1135 06/11/24 0448  NA 140 143  K 3.6 3.4*  CL 102 105  CO2 21* 20*  GLUCOSE 125* 89  BUN 73* 68*  CREATININE 6.60* 5.24*  CALCIUM  9.2 9.4    Intake/Output Summary (Last 24 hours) at 06/11/2024 0758 Last data filed at 06/10/2024 1249 Gross per 24 hour  Intake 236 ml  Output --  Net 236 ml         Physical Exam: Vital Signs Blood pressure 124/70, pulse 87, temperature 98.5 F (36.9 C), resp. rate 19, height 5' 5 (1.651 m), weight 83.3 kg, SpO2 99%.  General: No acute distress, resting comfortably in bed.  Mood and affect are flat Heart: Regular rate and rhythm no rubs murmurs or extra sounds, dialysis catheter R chest c/d/i Lungs: Clear to auscultation, breathing unlabored, no rales or wheezes Abdomen: Positive bowel sounds, soft nontender to palpation, nondistended Extremities: No clubbing, cyanosis, or edema Skin: No evidence of breakdown, no evidence of rash, Right upper chest HD cath CDI , no erythema   PRIOR EXAMS: Neurologic: Cranial nerves II through XII intact, motor strength is 5/5 in bilateral deltoid, bicep, tricep, grip, hip flexor, knee extensors, ankle dorsiflexor and plantar flexor Sensory exam reduced sensation to light touch left tip of middle finger Cerebellar exam normal finger to nose to finger as well as heel to shin in bilateral upper and lower extremities Musculoskeletal: Full range of motion in all 4 extremities. No joint swelling    Assessment/Plan: 1. Functional deficits due to debility  Stable for D/C today F/u PCP in 3-4 weeks F/u nephro 2 weeks No PMR f/u needed  See D/C summary See D/C instructions   Care Tool:  Bathing     Body parts bathed by patient: Right arm, Left arm, Chest, Abdomen, Front perineal area, Buttocks, Face, Left lower leg, Right lower leg, Left upper leg, Right upper leg         Bathing assist Assist Level: Independent with assistive device Assistive Device Comment: shower seat for shower   Upper Body Dressing/Undressing Upper body dressing   What is the patient wearing?: Pull over shirt    Upper body assist Assist Level: Independent    Lower Body Dressing/Undressing Lower body dressing      What is the patient wearing?: Pants, Underwear/pull up     Lower body assist Assist for lower body dressing: Independent     Toileting Toileting    Toileting assist Assist for toileting: Independent     Transfers Chair/bed transfer  Transfers assist     Chair/bed transfer assist level: Independent     Locomotion Ambulation   Ambulation assist      Assist level: Independent Assistive device: No Device Max distance: 980   Walk 10 feet activity   Assist     Assist level: Independent Assistive device: No Device   Walk  50 feet activity   Assist    Assist level: Independent Assistive device: No Device    Walk 150 feet activity   Assist    Assist level: Independent Assistive device: No Device    Walk 10 feet on uneven surface  activity   Assist Walk 10 feet on uneven surfaces activity did not occur: Safety/medical concerns (d/t fatigue)   Assist level: Independent Assistive device:  (none)   Wheelchair     Assist Is the patient using a wheelchair?: No Type of Wheelchair: Manual    Wheelchair assist level: Independent Max wheelchair distance: 150'    Wheelchair 50 feet with 2 turns activity    Assist        Assist Level: Independent   Wheelchair 150 feet activity     Assist      Assist Level: Independent   Blood pressure 124/70, pulse 87, temperature 98.5 F (36.9 C), resp. rate 19, height 5' 5 (1.651 m), weight  83.3 kg, SpO2 99%.  Medical Problem List and Plan: 1. Functional deficits- debility secondary to sepsis due to  pneumonia             D/c home today             2.  Antithrombotics: -DVT/anticoagulation:  Pharmaceutical: Heparin  5kU q8h- d/c amb > 100' with PT             -antiplatelet therapy: Plavix  daily 3. Pain Management: Tylenol prn.  4. Mood/Behavior/Sleep: LCSW to follow for evaluation and support.              -antipsychotic agents: N/A 5. Neuropsych/cognition: This patient is capable of making decisions on her own behalf. 6. Skin/Wound Care: Routine pressure measures. 7. Fluids/Electrolytes/Nutrition: Strict I/O. Daily weights. Continue vitamins/supplements   -06/07/24 labs stable 8.   Septic shock due to multifocal PNA: Has completed Unasyn-->doxycycline thru 10/21.             --diarrhea has resolved.    9.  T2DM poorly controlled: Hgb A1c-9.9. Monitor BS ac/hs and use SSI for elevated BS. Off metformin . Used 70/30 insulin  bid PTA.               --continue Insulin  glargine 22 units with 8 units novolog  tid ac  -11/4 controlled semglee  20U per day  CBG (last 3)  Recent Labs    06/10/24 1553 06/10/24 2124 06/11/24 0551  GLUCAP 207* 97 95     10. AKI due to septic shock: HD dependent at this time. Schedule dialysis at the end of the day to help with tolerance of therapy             --Strict I/O. Daily weights.              --continue Auyrxia for elevated phos.   -06/08/24 per nephro, holding dialysis for now.     Latest Ref Rng & Units 06/11/2024    4:48 AM 06/09/2024   11:35 AM 06/07/2024    6:34 AM  BMP  Glucose 70 - 99 mg/dL 89  874  879   BUN 6 - 20 mg/dL 68  73  62   Creatinine 0.44 - 1.00 mg/dL 4.75  3.39  2.47   Sodium 135 - 145 mmol/L 143  140  136   Potassium 3.5 - 5.1 mmol/L 3.4  3.6  3.6   Chloride 98 - 111 mmol/L 105  102  98   CO2 22 - 32 mmol/L 20  21  21   Calcium  8.9 - 10.3 mg/dL 9.4  9.2  8.8    Creat improving per Nephro, ok to d/c HD  low K+  give oral KCL 10meq x 1 11. Anemia of acute illness: Treated with Venofer and Aranesp. Initial Hgb at admit was 14gm, check stool OB --Hgb  improved from 6.8-->7.4--> 7.2- stable will check stool OB, results not available yet      Latest Ref Rng & Units 06/10/2024    4:51 AM 06/04/2024    2:50 AM 06/03/2024    8:45 AM  CBC  WBC 4.0 - 10.5 K/uL 6.5  7.4  7.8   Hemoglobin 12.0 - 15.0 g/dL 8.4  7.2  7.4   Hematocrit 36.0 - 46.0 % 26.2  21.9  22.3   Platelets 150 - 400 K/uL 286  352  379    Stool OB neg 11/4, Hgb improving although may be hemoconcentration  12. CAD: Continue plavix . Metoprolol  50mg  BID resumed on 10/29 due to sinus tachycardia. Tachy may be related to anemia   13. Abnormal LFTs: Likely due to shocked liver. Improved 10/30     14. HLD: continue zetia  10mg  daily  15.  Constipation LBM 11/1  LOS: 7 days A FACE TO FACE EVALUATION WAS PERFORMED  Danielle Flores 06/11/2024, 7:58 AM

## 2024-07-08 DIAGNOSIS — N179 Acute kidney failure, unspecified: Secondary | ICD-10-CM | POA: Diagnosis not present

## 2024-07-08 DIAGNOSIS — I129 Hypertensive chronic kidney disease with stage 1 through stage 4 chronic kidney disease, or unspecified chronic kidney disease: Secondary | ICD-10-CM | POA: Diagnosis not present

## 2024-07-08 DIAGNOSIS — Z531 Procedure and treatment not carried out because of patient's decision for reasons of belief and group pressure: Secondary | ICD-10-CM | POA: Diagnosis not present

## 2024-07-08 DIAGNOSIS — E1122 Type 2 diabetes mellitus with diabetic chronic kidney disease: Secondary | ICD-10-CM | POA: Diagnosis not present

## 2024-07-08 DIAGNOSIS — I2510 Atherosclerotic heart disease of native coronary artery without angina pectoris: Secondary | ICD-10-CM | POA: Diagnosis not present

## 2024-07-08 LAB — LAB REPORT - SCANNED: EGFR: 40

## 2024-07-09 ENCOUNTER — Other Ambulatory Visit (HOSPITAL_COMMUNITY): Payer: Self-pay

## 2024-07-16 ENCOUNTER — Telehealth

## 2024-07-17 ENCOUNTER — Ambulatory Visit: Payer: Self-pay | Admitting: Family Medicine

## 2024-07-18 ENCOUNTER — Encounter: Payer: Self-pay | Admitting: Family Medicine

## 2024-07-22 ENCOUNTER — Ambulatory Visit: Admitting: Family Medicine

## 2024-07-28 ENCOUNTER — Other Ambulatory Visit (INDEPENDENT_AMBULATORY_CARE_PROVIDER_SITE_OTHER): Payer: Self-pay

## 2024-08-12 NOTE — Progress Notes (Shared)
 " [facility] - Clinic Note  08/26/2024     CHIEF COMPLAINT Patient presents for No chief complaint on file.    HISTORY OF PRESENT ILLNESS: Danielle Flores is a 59 y.o. female who presents to the clinic today for:    Pt states about 2 weeks she started seeing grey spots in her vision, she went to see Dr. Robinson who told her a vessel ruptured in her left eye, pt has high blood pressure and takes 3 medications, she states she checks her BP at home, but has checked it in a couple weeks, last time she checked it was 125/82, pt sees a rheumatologist who discovered she has ANA, she does not have hx of blood clots, but has been taking a blood thinner (Brilinta ) since 2019, pt states right before this episode happened her dosage of Brillinta was lowered from 90mg  to 60mg  and she was taken off aspirin  bc she had a nose bleed that took 3 hours to stop, pt states she is also diabetic and takes insulin  and oral medication  Referring physician: Antonio Meth, Jamee SAUNDERS, DO [address] [place],  [place] [address]  HISTORICAL INFORMATION:   Selected notes from the MEDICAL RECORD NUMBER Referred by Dr. JAMA:  Ocular Hx- PMH-    CURRENT MEDICATIONS: No current outpatient medications on file. (Ophthalmic Drugs)   No current facility-administered medications for this visit. (Ophthalmic Drugs)   Current Outpatient Medications (Other)  Medication Sig   acetaminophen  (TYLENOL ) 325 MG tablet Take 2 tablets (650 mg total) by mouth every 6 (six) hours as needed for mild pain (pain score 1-3) or headache.   blood glucose meter kit and supplies Relion Prime or Dispense other brand based on patient and insurance preference. Use up to four times daily as directed. (FOR ICD-9 250.00, 250.01).   calcium  carbonate (TUMS - DOSED IN MG ELEMENTAL CALCIUM ) 500 MG chewable tablet Chew 1 tablet (200 mg of elemental calcium  total) by mouth 3 (three) times daily as needed for indigestion or  heartburn.   clopidogrel  (PLAVIX ) 75 MG tablet Take 1 tablet (75 mg total) by mouth daily.   ezetimibe  (ZETIA ) 10 MG tablet Take 1 tablet (10 mg total) by mouth daily.   insulin  glargine (LANTUS ) 100 UNIT/ML Solostar Pen Inject 20 Units into the skin daily.   Insulin  Pen Needle 32G X 4 MM MISC Use as directed with insulin .   metoprolol  tartrate (LOPRESSOR ) 50 MG tablet Take 1 tablet (50 mg total) by mouth 2 (two) times daily.   multivitamin (RENA-VIT) TABS tablet Take 1 tablet by mouth at bedtime.   Nystatin  (GERHARDT'S BUTT CREAM) CREA Apply 1 Application topically daily.   No current facility-administered medications for this visit. (Other)   REVIEW OF SYSTEMS:   ALLERGIES Allergies  Allergen Reactions   Lisinopril  Hives, Itching and Swelling    Lip swelling, itching from head to toe, hives chest, breast, and abdomen.   Atorvastatin  Other (See Comments)    Myalgias   Red Blood Cells     Jehova's witness    PAST MEDICAL HISTORY Past Medical History:  Diagnosis Date   AKI (acute kidney injury) 05/18/2024   CAD (coronary artery disease) 2021   DKA (diabetic ketoacidosis) (HCC) 05/18/2024   DM2 (diabetes mellitus, type 2) (HCC)    Hyperglycemia    Hyperlipidemia    Hypertension    Patient requiring acute dialysis 05/20/2024   Sepsis (HCC) 05/18/2024   Past Surgical History:  Procedure Laterality Date   CESAREAN SECTION     CORONARY STENT INTERVENTION N/A 09/16/2018   Procedure: CORONARY STENT INTERVENTION;  Surgeon: Dann Candyce RAMAN, MD;  Location: [facility] INVASIVE CV LAB;  Service: Cardiovascular;  Laterality: N/A;   IR REMOVAL TUN CV CATH W/O FL  06/10/2024   IR TUNNELED CENTRAL VENOUS CATH PLC W IMG  06/02/2024   LEFT HEART CATH AND CORONARY ANGIOGRAPHY N/A 09/16/2018   Procedure: LEFT HEART CATH AND CORONARY ANGIOGRAPHY;  Surgeon: Dann Candyce RAMAN, MD;  Location: [facility] INVASIVE CV LAB;  Service: Cardiovascular;  Laterality: N/A;    FAMILY HISTORY Family History   Problem Relation Age of Onset   Diabetes Mother    Diabetes Sister    Sickle cell anemia Paternal Aunt    Heart disease Maternal Grandmother        heart enlargement   Diabetes Other        1st degree relative    SOCIAL HISTORY Social History   Tobacco Use   Smoking status: Never   Smokeless tobacco: Never  Vaping Use   Vaping status: Never Used  Substance Use Topics   Alcohol use: Yes    Comment: twice montly   Drug use: No       OPHTHALMIC EXAM:  Not recorded    IMAGING AND PROCEDURES  Imaging and Procedures for 08/26/2024          ASSESSMENT/PLAN:  No diagnosis found.  1,2. Inferior HRVO w/ CME, OS  - pt reports onset of decreased vision OS around mid Nov 2022 -- had just recently stopped ASA and decreased Brillinta dosage due to 3hr nose-bleed  - history of +Lupus anticoagulant and +ANA  - The natural history of retinal vein occlusion and macular edema and treatment options including observation, laser photocoagulation, and intravitreal antiVEGF injection with Avastin  and Lucentis and Eylea and intravitreal injection of steroids with triamcinolone and Ozurdex and the complications of these procedures including loss of vision, infection, cataract, glaucoma, and retinal detachment were discussed with patient.  - Specifically discussed findings from CRUISE / BRAVO study regarding patient stabilization with anti-VEGF agents and increased potential for visual improvements.  Also discussed need for frequent follow up and potentially multiple injections given the chronic nature of the disease process  - recommend IVA OS #1 today, 11.30.2022  - pt wishes to proceed with injection  - RBA of procedure discussed, questions answered - informed consent obtained and signed  - see procedure note - f/u 4 wks -- DFE/OCT, possible injection  3,4. Hypertensive retinopathy OU - discussed importance of tight BP control - monitor  5. Diabetes mellitus, type 2 without  retinopathy - The incidence, risk factors for progression, natural history and treatment options for diabetic retinopathy  were discussed with patient.   - The need for close monitoring of blood glucose, blood pressure, and serum lipids, avoiding cigarette or any type of tobacco, and the need for long term follow up was also discussed with patient. - although no retinopathy, history of DM may have contributed to HRVO  6. Mixed Cataract OU - The symptoms of cataract, surgical options, and treatments and risks were discussed with patient. - discussed diagnosis and progression - not yet visually significant - monitor for now  Ophthalmic Meds Ordered this visit:  No orders of the defined types were placed in this encounter.    No follow-ups on file.  There are no Patient Instructions on file for this visit.   Explained the diagnoses, plan,  and follow up with the patient and they expressed understanding.  Patient expressed understanding of the importance of proper follow up care.     Redell JUDITHANN Hans, M.D., Ph.D. Diseases & Surgery of the Retina and Vitreous [facility]     Abbreviations: M myopia (nearsighted); A astigmatism; H hyperopia (farsighted); P presbyopia; Mrx spectacle prescription;  CTL contact lenses; OD right eye; OS left eye; OU both eyes  XT exotropia; ET esotropia; PEK punctate epithelial keratitis; PEE punctate epithelial erosions; DES dry eye syndrome; MGD meibomian gland dysfunction; ATs artificial tears; PFAT's preservative free artificial tears; NSC nuclear sclerotic cataract; PSC posterior subcapsular cataract; ERM epi-retinal membrane; PVD posterior vitreous detachment; RD retinal detachment; DM diabetes mellitus; DR diabetic retinopathy; NPDR non-proliferative diabetic retinopathy; PDR proliferative diabetic retinopathy; CSME clinically significant macular edema; DME diabetic macular edema; dbh dot blot hemorrhages; CWS cotton wool spot; POAG  primary open angle glaucoma; C/D cup-to-disc ratio; HVF humphrey visual field; GVF goldmann visual field; OCT optical coherence tomography; IOP intraocular pressure; BRVO Branch retinal vein occlusion; CRVO central retinal vein occlusion; CRAO central retinal artery occlusion; BRAO branch retinal artery occlusion; RT retinal tear; SB scleral buckle; PPV pars plana vitrectomy; VH Vitreous hemorrhage; PRP panretinal laser photocoagulation; IVK intravitreal kenalog; VMT vitreomacular traction; MH Macular hole;  NVD neovascularization of the disc; NVE neovascularization elsewhere; AREDS age related eye disease study; ARMD age related macular degeneration; POAG primary open angle glaucoma; EBMD epithelial/anterior basement membrane dystrophy; ACIOL anterior chamber intraocular lens; IOL intraocular lens; PCIOL posterior chamber intraocular lens; Phaco/IOL phacoemulsification with intraocular lens placement; PRK photorefractive keratectomy; LASIK laser assisted in situ keratomileusis; HTN hypertension; DM diabetes mellitus; COPD chronic obstructive pulmonary disease  "

## 2024-08-26 ENCOUNTER — Encounter (INDEPENDENT_AMBULATORY_CARE_PROVIDER_SITE_OTHER): Payer: Self-pay | Admitting: Ophthalmology

## 2024-08-26 DIAGNOSIS — H35033 Hypertensive retinopathy, bilateral: Secondary | ICD-10-CM

## 2024-08-26 DIAGNOSIS — Z794 Long term (current) use of insulin: Secondary | ICD-10-CM

## 2024-08-26 DIAGNOSIS — E119 Type 2 diabetes mellitus without complications: Secondary | ICD-10-CM

## 2024-08-26 DIAGNOSIS — H25813 Combined forms of age-related cataract, bilateral: Secondary | ICD-10-CM

## 2024-08-26 DIAGNOSIS — D573 Sickle-cell trait: Secondary | ICD-10-CM

## 2024-08-26 DIAGNOSIS — Z8669 Personal history of other diseases of the nervous system and sense organs: Secondary | ICD-10-CM

## 2024-08-26 DIAGNOSIS — I10 Essential (primary) hypertension: Secondary | ICD-10-CM

## 2024-08-26 NOTE — Progress Notes (Shared)
 " [facility] - Clinic Note  09/09/2024     CHIEF COMPLAINT Patient presents for No chief complaint on file.    HISTORY OF PRESENT ILLNESS: Danielle Flores is a 59 y.o. female who presents to the clinic today for:    Pt states about 2 weeks she started seeing grey spots in her vision, she went to see Dr. Robinson who told her a vessel ruptured in her left eye, pt has high blood pressure and takes 3 medications, she states she checks her BP at home, but has checked it in a couple weeks, last time she checked it was 125/82, pt sees a rheumatologist who discovered she has ANA, she does not have hx of blood clots, but has been taking a blood thinner (Brilinta ) since 2019, pt states right before this episode happened her dosage of Brillinta was lowered from 90mg  to 60mg  and she was taken off aspirin  bc she had a nose bleed that took 3 hours to stop, pt states she is also diabetic and takes insulin  and oral medication  Referring physician: Antonio Meth, Jamee SAUNDERS, DO [address] [place],  [place] [address]  HISTORICAL INFORMATION:   Selected notes from the MEDICAL RECORD NUMBER Referred by Dr. JAMA:  Ocular Hx- PMH-    CURRENT MEDICATIONS: No current outpatient medications on file. (Ophthalmic Drugs)   No current facility-administered medications for this visit. (Ophthalmic Drugs)   Current Outpatient Medications (Other)  Medication Sig   acetaminophen  (TYLENOL ) 325 MG tablet Take 2 tablets (650 mg total) by mouth every 6 (six) hours as needed for mild pain (pain score 1-3) or headache.   blood glucose meter kit and supplies Relion Prime or Dispense other brand based on patient and insurance preference. Use up to four times daily as directed. (FOR ICD-9 250.00, 250.01).   calcium  carbonate (TUMS - DOSED IN MG ELEMENTAL CALCIUM ) 500 MG chewable tablet Chew 1 tablet (200 mg of elemental calcium  total) by mouth 3 (three) times daily as needed for indigestion or  heartburn.   clopidogrel  (PLAVIX ) 75 MG tablet Take 1 tablet (75 mg total) by mouth daily.   ezetimibe  (ZETIA ) 10 MG tablet Take 1 tablet (10 mg total) by mouth daily.   insulin  glargine (LANTUS ) 100 UNIT/ML Solostar Pen Inject 20 Units into the skin daily.   Insulin  Pen Needle 32G X 4 MM MISC Use as directed with insulin .   metoprolol  tartrate (LOPRESSOR ) 50 MG tablet Take 1 tablet (50 mg total) by mouth 2 (two) times daily.   multivitamin (RENA-VIT) TABS tablet Take 1 tablet by mouth at bedtime.   Nystatin  (GERHARDT'S BUTT CREAM) CREA Apply 1 Application topically daily.   No current facility-administered medications for this visit. (Other)   REVIEW OF SYSTEMS:   ALLERGIES Allergies  Allergen Reactions   Lisinopril  Hives, Itching and Swelling    Lip swelling, itching from head to toe, hives chest, breast, and abdomen.   Atorvastatin  Other (See Comments)    Myalgias   Red Blood Cells     Jehova's witness    PAST MEDICAL HISTORY Past Medical History:  Diagnosis Date   AKI (acute kidney injury) 05/18/2024   CAD (coronary artery disease) 2021   DKA (diabetic ketoacidosis) (HCC) 05/18/2024   DM2 (diabetes mellitus, type 2) (HCC)    Hyperglycemia    Hyperlipidemia    Hypertension    Patient requiring acute dialysis 05/20/2024   Sepsis (HCC) 05/18/2024   Past Surgical History:  Procedure Laterality Date   CESAREAN SECTION     CORONARY STENT INTERVENTION N/A 09/16/2018   Procedure: CORONARY STENT INTERVENTION;  Surgeon: Dann Candyce RAMAN, MD;  Location: [facility] INVASIVE CV LAB;  Service: Cardiovascular;  Laterality: N/A;   IR REMOVAL TUN CV CATH W/O FL  06/10/2024   IR TUNNELED CENTRAL VENOUS CATH PLC W IMG  06/02/2024   LEFT HEART CATH AND CORONARY ANGIOGRAPHY N/A 09/16/2018   Procedure: LEFT HEART CATH AND CORONARY ANGIOGRAPHY;  Surgeon: Dann Candyce RAMAN, MD;  Location: [facility] INVASIVE CV LAB;  Service: Cardiovascular;  Laterality: N/A;    FAMILY HISTORY Family History   Problem Relation Age of Onset   Diabetes Mother    Diabetes Sister    Sickle cell anemia Paternal Aunt    Heart disease Maternal Grandmother        heart enlargement   Diabetes Other        1st degree relative    SOCIAL HISTORY Social History   Tobacco Use   Smoking status: Never   Smokeless tobacco: Never  Vaping Use   Vaping status: Never Used  Substance Use Topics   Alcohol use: Yes    Comment: twice montly   Drug use: No       OPHTHALMIC EXAM:  Not recorded    IMAGING AND PROCEDURES  Imaging and Procedures for 09/09/2024          ASSESSMENT/PLAN:  No diagnosis found.  1,2. Inferior HRVO w/ CME, OS  - pt reports onset of decreased vision OS around mid Nov 2022 -- had just recently stopped ASA and decreased Brillinta dosage due to 3hr nose-bleed  - history of +Lupus anticoagulant and +ANA  -s/p IVA OS #1 (11.30.22)  - recommend IVA OS #1 today, 11.30.2022  - pt wishes to proceed with injection  - RBA of procedure discussed, questions answered - informed consent obtained and signed  - see procedure note - f/u 4 wks -- DFE/OCT, possible injection  3,4. Hypertensive retinopathy OU - discussed importance of tight BP control - monitor  5. Diabetes mellitus, type 2 without retinopathy - The incidence, risk factors for progression, natural history and treatment options for diabetic retinopathy  were discussed with patient.   - The need for close monitoring of blood glucose, blood pressure, and serum lipids, avoiding cigarette or any type of tobacco, and the need for long term follow up was also discussed with patient. - although no retinopathy, history of DM may have contributed to HRVO  6. Mixed Cataract OU - The symptoms of cataract, surgical options, and treatments and risks were discussed with patient. - discussed diagnosis and progression - not yet visually significant - monitor for now  Ophthalmic Meds Ordered this visit:  No orders of the defined  types were placed in this encounter.    No follow-ups on file.  There are no Patient Instructions on file for this visit.   Explained the diagnoses, plan, and follow up with the patient and they expressed understanding.  Patient expressed understanding of the importance of proper follow up care.     Redell JUDITHANN Hans, M.D., Ph.D. Diseases & Surgery of the Retina and Vitreous [facility]     Abbreviations: M myopia (nearsighted); A astigmatism; H hyperopia (farsighted); P presbyopia; Mrx spectacle prescription;  CTL contact lenses; OD right eye; OS left eye; OU both eyes  XT exotropia; ET esotropia; PEK punctate epithelial keratitis; PEE punctate epithelial erosions; DES dry eye syndrome; MGD  meibomian gland dysfunction; ATs artificial tears; PFAT's preservative free artificial tears; NSC nuclear sclerotic cataract; PSC posterior subcapsular cataract; ERM epi-retinal membrane; PVD posterior vitreous detachment; RD retinal detachment; DM diabetes mellitus; DR diabetic retinopathy; NPDR non-proliferative diabetic retinopathy; PDR proliferative diabetic retinopathy; CSME clinically significant macular edema; DME diabetic macular edema; dbh dot blot hemorrhages; CWS cotton wool spot; POAG primary open angle glaucoma; C/D cup-to-disc ratio; HVF humphrey visual field; GVF goldmann visual field; OCT optical coherence tomography; IOP intraocular pressure; BRVO Branch retinal vein occlusion; CRVO central retinal vein occlusion; CRAO central retinal artery occlusion; BRAO branch retinal artery occlusion; RT retinal tear; SB scleral buckle; PPV pars plana vitrectomy; VH Vitreous hemorrhage; PRP panretinal laser photocoagulation; IVK intravitreal kenalog; VMT vitreomacular traction; MH Macular hole;  NVD neovascularization of the disc; NVE neovascularization elsewhere; AREDS age related eye disease study; ARMD age related macular degeneration; POAG primary open angle glaucoma; EBMD  epithelial/anterior basement membrane dystrophy; ACIOL anterior chamber intraocular lens; IOL intraocular lens; PCIOL posterior chamber intraocular lens; Phaco/IOL phacoemulsification with intraocular lens placement; PRK photorefractive keratectomy; LASIK laser assisted in situ keratomileusis; HTN hypertension; DM diabetes mellitus; COPD chronic obstructive pulmonary disease  "

## 2024-09-09 ENCOUNTER — Encounter (INDEPENDENT_AMBULATORY_CARE_PROVIDER_SITE_OTHER): Payer: Self-pay | Admitting: Ophthalmology

## 2024-09-09 DIAGNOSIS — H3581 Retinal edema: Secondary | ICD-10-CM
